# Patient Record
Sex: Female | Born: 1975 | Race: White | Hispanic: No | Marital: Married | Smoking: Current every day smoker
Health system: Southern US, Community
[De-identification: ages and names within clinical notes are randomized; demographics above are authoritative.]

## PROBLEM LIST (undated history)

## (undated) DIAGNOSIS — G5601 Carpal tunnel syndrome, right upper limb: Secondary | ICD-10-CM

## (undated) DIAGNOSIS — D6861 Antiphospholipid syndrome: Secondary | ICD-10-CM

## (undated) DIAGNOSIS — M1990 Unspecified osteoarthritis, unspecified site: Secondary | ICD-10-CM

## (undated) DIAGNOSIS — F419 Anxiety disorder, unspecified: Secondary | ICD-10-CM

## (undated) DIAGNOSIS — F32A Depression, unspecified: Secondary | ICD-10-CM

## (undated) DIAGNOSIS — D689 Coagulation defect, unspecified: Secondary | ICD-10-CM

## (undated) DIAGNOSIS — T7840XA Allergy, unspecified, initial encounter: Secondary | ICD-10-CM

## (undated) DIAGNOSIS — F329 Major depressive disorder, single episode, unspecified: Secondary | ICD-10-CM

## (undated) HISTORY — DX: Coagulation defect, unspecified: D68.9

## (undated) HISTORY — DX: Depression, unspecified: F32.A

## (undated) HISTORY — DX: Unspecified osteoarthritis, unspecified site: M19.90

## (undated) HISTORY — PX: DILATION AND CURETTAGE OF UTERUS: SHX78

## (undated) HISTORY — DX: Anxiety disorder, unspecified: F41.9

## (undated) HISTORY — DX: Allergy, unspecified, initial encounter: T78.40XA

## (undated) HISTORY — DX: Major depressive disorder, single episode, unspecified: F32.9

## (undated) HISTORY — DX: Antiphospholipid syndrome: D68.61

## (undated) HISTORY — PX: EYE SURGERY: SHX253

---

## 2004-10-10 ENCOUNTER — Emergency Department (HOSPITAL_COMMUNITY): Admission: EM | Admit: 2004-10-10 | Discharge: 2004-10-10 | Payer: Self-pay | Admitting: Emergency Medicine

## 2006-05-07 ENCOUNTER — Encounter: Payer: Self-pay | Admitting: Maternal & Fetal Medicine

## 2006-05-09 ENCOUNTER — Ambulatory Visit: Payer: Self-pay

## 2006-05-14 ENCOUNTER — Encounter: Payer: Self-pay | Admitting: Maternal & Fetal Medicine

## 2006-05-21 ENCOUNTER — Encounter: Payer: Self-pay | Admitting: Maternal & Fetal Medicine

## 2006-06-04 ENCOUNTER — Encounter: Payer: Self-pay | Admitting: Maternal & Fetal Medicine

## 2006-07-24 ENCOUNTER — Emergency Department (HOSPITAL_COMMUNITY): Admission: EM | Admit: 2006-07-24 | Discharge: 2006-07-25 | Payer: Self-pay | Admitting: Emergency Medicine

## 2006-09-01 ENCOUNTER — Ambulatory Visit (HOSPITAL_COMMUNITY): Admission: EM | Admit: 2006-09-01 | Discharge: 2006-09-01 | Payer: Self-pay | Admitting: Emergency Medicine

## 2006-09-24 ENCOUNTER — Observation Stay (HOSPITAL_COMMUNITY): Admission: RE | Admit: 2006-09-24 | Discharge: 2006-09-24 | Payer: Self-pay | Admitting: Obstetrics and Gynecology

## 2006-10-22 ENCOUNTER — Ambulatory Visit (HOSPITAL_COMMUNITY): Admission: RE | Admit: 2006-10-22 | Discharge: 2006-10-22 | Payer: Self-pay | Admitting: Obstetrics and Gynecology

## 2006-11-23 ENCOUNTER — Ambulatory Visit (HOSPITAL_COMMUNITY): Admission: AD | Admit: 2006-11-23 | Discharge: 2006-11-23 | Payer: Self-pay | Admitting: Obstetrics and Gynecology

## 2006-11-27 ENCOUNTER — Ambulatory Visit (HOSPITAL_COMMUNITY): Admission: AD | Admit: 2006-11-27 | Discharge: 2006-11-27 | Payer: Self-pay | Admitting: Obstetrics and Gynecology

## 2006-11-30 ENCOUNTER — Ambulatory Visit (HOSPITAL_COMMUNITY): Admission: AD | Admit: 2006-11-30 | Discharge: 2006-11-30 | Payer: Self-pay | Admitting: Obstetrics and Gynecology

## 2006-12-03 ENCOUNTER — Ambulatory Visit (HOSPITAL_COMMUNITY): Admission: AD | Admit: 2006-12-03 | Discharge: 2006-12-03 | Payer: Self-pay | Admitting: Obstetrics and Gynecology

## 2006-12-06 ENCOUNTER — Ambulatory Visit (HOSPITAL_COMMUNITY): Admission: AD | Admit: 2006-12-06 | Discharge: 2006-12-06 | Payer: Self-pay | Admitting: Obstetrics and Gynecology

## 2006-12-10 ENCOUNTER — Ambulatory Visit (HOSPITAL_COMMUNITY): Admission: AD | Admit: 2006-12-10 | Discharge: 2006-12-10 | Payer: Self-pay | Admitting: Obstetrics and Gynecology

## 2006-12-13 ENCOUNTER — Ambulatory Visit (HOSPITAL_COMMUNITY): Admission: AD | Admit: 2006-12-13 | Discharge: 2006-12-13 | Payer: Self-pay | Admitting: Obstetrics and Gynecology

## 2006-12-17 ENCOUNTER — Ambulatory Visit (HOSPITAL_COMMUNITY): Admission: AD | Admit: 2006-12-17 | Discharge: 2006-12-17 | Payer: Self-pay | Admitting: Obstetrics and Gynecology

## 2006-12-24 ENCOUNTER — Ambulatory Visit (HOSPITAL_COMMUNITY): Admission: RE | Admit: 2006-12-24 | Discharge: 2006-12-24 | Payer: Self-pay | Admitting: Obstetrics & Gynecology

## 2006-12-25 ENCOUNTER — Inpatient Hospital Stay (HOSPITAL_COMMUNITY): Admission: AD | Admit: 2006-12-25 | Discharge: 2006-12-26 | Payer: Self-pay | Admitting: Obstetrics and Gynecology

## 2007-01-13 ENCOUNTER — Inpatient Hospital Stay (HOSPITAL_COMMUNITY): Admission: AD | Admit: 2007-01-13 | Discharge: 2007-01-15 | Payer: Self-pay | Admitting: Obstetrics & Gynecology

## 2007-01-13 ENCOUNTER — Ambulatory Visit: Payer: Self-pay | Admitting: Obstetrics and Gynecology

## 2007-01-13 ENCOUNTER — Inpatient Hospital Stay (HOSPITAL_COMMUNITY): Admission: AD | Admit: 2007-01-13 | Discharge: 2007-01-13 | Payer: Self-pay | Admitting: Obstetrics and Gynecology

## 2007-11-05 ENCOUNTER — Other Ambulatory Visit: Payer: Self-pay

## 2007-11-05 ENCOUNTER — Emergency Department: Payer: Self-pay | Admitting: Emergency Medicine

## 2009-06-26 HISTORY — PX: ABDOMINAL HYSTERECTOMY: SHX81

## 2009-06-26 HISTORY — PX: TOTAL ABDOMINAL HYSTERECTOMY: SHX209

## 2009-06-26 HISTORY — PX: PARTIAL HYSTERECTOMY: SHX80

## 2010-11-08 ENCOUNTER — Ambulatory Visit: Payer: Self-pay | Admitting: General Practice

## 2010-11-08 NOTE — H&P (Signed)
Diana Jensen, Diana Jensen                 ACCOUNT NO.:  [id number]   MEDICAL RECORD NO.:  [id number]          PATIENT TYPE:  INP   LOCATION:  NA                            [facility]:  [facility]   PHYSICIAN:  Tilda Burrow, M.D. DATE OF BIRTH:  16-Sep-1975   DATE OF ADMISSION:  DATE OF DISCHARGE:                              HISTORY & PHYSICAL   ADMITTING DIAGNOSES:  1. Pregnancy, 39 weeks' gestation.  2. Prior cesarean section, not for trial of labor.  3. Antiphospholipid syndrome.  4. Scheduled for admission.   Date of admission January 03, 2007.   This 35 year old female, gravida 5, para 1-0-3-1.  Antiphospholipid  syndrome.  History of positive lupus anticoagulant.   HISTORY OF PRESENT ILLNESS:  This 35 year old female, gravida 5, para 1-  0-3-1 is admitted for repeat cesarean section.  Pregnancy has been  notable for initial prenatal care through [facility] in [place]  with consultation to hematology at [facility], Dr. Lady Deutscher.  She was then followed through the pregnancy with a straight-forward  course  with antiphospholipid syndrome managed with subcu Lovenox 40 mg  daily until the patient was eight weeks' gestation, 40 mg subcu twice  daily until 36 weeks, at which time efforts were made to convert her to  subcu heparin 10,000 units subcu daily, but this ran into an obstacle  due to lack of access to concentrated, unfractionated heparin.  She has  therefore been kept on subcutaneous Lovenox 40 mg twice daily until  shortly before the surgery.  She will be discontinued the day before  surgery.  Surgery is scheduled for July 10 at 9 a.m.   Pregnant course has been straight-forward.  She has had ultrasound on  December 24, 2006, for estimated fetal weight of the baby.  She has a  history of large-for-gestational-age 3 pound 8 ounce infant delivered at  term in 2002.  She does not desire permanent sterilization.   PAST MEDICAL HISTORY:  Negative other than antiphospholipid  syndrome and  apparent identification in the past of lupus anticoagulant, tested  positive in February 1998.  This test was performed after 14 weeks'  gestation spontaneous abortion of triplets.  Plans are for her to resume  subcutaneous Lovenox and enoxaparin 24 hours after the cesarean section.  She will continue this for six weeks postpartum.  Past medical history  otherwise negative.   PAST SURGICAL HISTORY:  Broken right ankle surgery in 2006.   ALLERGIES:  None known.   MEDICATIONS:  1. Lovenox 40 mg subcu b.i.d.  2. Aspirin 81 mg p.o. daily.   SOCIAL HISTORY:  Married.  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   PRENATAL LABS:  Include blood type O positive.  Urine drug screen  negative.  Rubella immune.  Present hemoglobin 12, hematocrit 41.  Hepatitis, HIV, RPR, GC and Chlamydia all negative.  One-hour glucose  tolerance test 135 mg%.   The patient has been seen in labor and delivery for false labor x1, on  July 1, and is scheduled for cesarean section.   PHYSICAL EXAMINATION:  VITAL SIGNS:  Height 5  feet 7 inches, weight 274,  which is a 10-pound weight gain this pregnancy.  GENERAL:  Patient is alert, oriented x3.  HEENT:  Pupils equal, round, reactive.  NECK:  Supple.  Trachea midline.  ABDOMEN:  A 44-cm fundal height.  Estimated fetal weight 9 pounds plus.  Cervix long, closed and high at last check.   PLAN:  1. Repeat cesarean section on 03 January 2007 at 9 a.m.  2. Resume subcutaneous Lovenox 40 mg subcu daily x6 weeks postpartum.      Tilda Burrow, M.D.  Electronically Signed     JVF/MEDQ  D:  12/26/2006  T:  12/27/2006  Job:  [id number]   cc:   [facility]

## 2010-11-08 NOTE — Consult Note (Signed)
[name], Diana Jensen                 ACCOUNT NO.:  [id number]   MEDICAL RECORD NO.:  [id number]          PATIENT TYPE:  MAT   LOCATION:  MATC                          [facility]:  [facility]   PHYSICIAN:  Tilda Burrow, M.D. DATE OF BIRTH:  1975-07-05   DATE OF CONSULTATION:  12/26/2006  DATE OF DISCHARGE:  12/26/2006                                 CONSULTATION   OBSERVATION TIME:  2 hours.   This 35 year old female, now at [redacted] weeks gestation due 18 July with  known breech presentation is seen to rule out labor.  She is having  bilateral inguinal discomfort and originally was not sure if it was  fetal movement or contractions.  After assessing, she has decided it is  mainly fetal movement associated with the discomfort.  She is having no  active contractions.  Cervix checked by a nurse was reported as closed.  She is felt to be persistent in the breech presentation.  She had an  ultrasound that [facility] yesterday, report results at [facility].   IMPRESSION:  1. Pregnancy at 38+ weeks.  2. Lightening discomfort due to - lower uterine segment changes.  3. No evidence of labor.  4. Persistent breech presentation.   PLAN:  Follow up within a week for a doctor here to schedule a section  as previously planned on the 10th.  The patient reminded of how to  contact our office for advancing service and office hours, etc.   SPECIAL NOTE:  The patient has a 1 hour travel time from her [place] home to [facility].      Tilda Burrow, M.D.  Electronically Signed     JVF/MEDQ  D:  12/26/2006  T:  12/26/2006  Job:  [id number]

## 2010-11-11 NOTE — H&P (Signed)
Diana Jensen, Diana Jensen                 ACCOUNT NO.:  [id number]   MEDICAL RECORD NO.:  [id number]          PATIENT TYPE:  OIB   LOCATION:  LDR3                          FACILITY:  [facility]   PHYSICIAN:  Tilda Burrow, M.D. DATE OF BIRTH:  [birth year]   DATE OF ADMISSION:  09/24/2006  DATE OF DISCHARGE:  LH                              HISTORY & PHYSICAL   REASON FOR ADMISSION:  Pregnancy at 24 weeks with severe stuffy nose,  nasal drainage and vomiting due to copious sinus drainage.   MEDICAL HISTORY:  Positive for an antiphospholipid abnormality, positive  for lupus, anticoagulant.   MEDICATIONS:  Lovenox and aspirin and a prenatal vitamin.   Blood type is O positive.  RPR is nonreactive.  She is positive for  Rubella, negative for hepatitis, negative for gonorrhea and chlamydia.   Her obstetrical history has been complicated by miscarriages due to the  antiphospholipid disorder, but she is now 24 weeks and presents with an  upper respiratory tract infection.   PLAN:  We are going to get a CBC, a CMP, assess a urine.  Place her on  Zyrtec D b.i.d., Afrin at bedside, a Z-Pak and if she is retaining  fluids and feeling better by 12, we will discharge her at home.      [name], [name]      Tilda Burrow, M.D.  Electronically Signed    DL/MEDQ  D:  04/54/0981  T:  09/24/2006  Job:  [id number]   cc:   [facility]

## 2010-11-11 NOTE — Discharge Summary (Signed)
NAMEJANIA, Diana Jensen                 ACCOUNT NO.:  [id number]   MEDICAL RECORD NO.:  [id number]          PATIENT TYPE:  INP   LOCATION:                                [facility]   PHYSICIAN:  Tilda Burrow, M.D. DATE OF BIRTH:  1976-02-20   DATE OF ADMISSION:  01/13/2007  DATE OF DISCHARGE:  01/15/2007                               DISCHARGE SUMMARY   ADMISSION DIAGNOSIS:  Pregnancy [redacted] weeks gestation Antiphospholipid  Syndrome, status post anticoagualant therapy throughout pregnancy.   HISTORY OF PRESENT ILLNESS:  This 35 year old female gravida 5, para 1-0-  3-1 with history of positive Lupus Anticoagulants and antiphospholipid  syndrome test after a spontaneous abortion, diagnosed after loss of  triplets in 1998 at [facility] is admitted for repeat cesarean  section on 01/13/07.  After pregnancy followed through [facility] with the patient consultant with hematology, Dr. Hughes Better  who  recommended subcutaneous  Lovenox 40 mg subcutaneously once daily until  [redacted] weeks gestation, twice daily until [redacted] weeks gestation and then  continued until delivery.  She had normal__  weight gain fetal  monitoring tests and fetal activity.   HOSPITAL COURSE:  The patient was admitted, and proceeded to vaginal  Delivery by Dr Marice Potter on 7/20 2008, was resumed on subcutaneous Lovenox  (enoxaparin DML ) to be continued in 40 mg subcutaneously daily for 6  weeks postpartum.  Her hospital course was uneventful with the patient  having a straightforward uncomplicated surgical recovery.  She will be  followed up in 1 week and then 4 weeks [facility].  Routine  postoperative and surgical instructions were reviewed with the patient.   Note prior incorrect dictation corrected as above.      Tilda Burrow, M.D.  Electronically Signed     JVF/MEDQ  D:  02/19/2007  T:  02/20/2007  Job:  [id number]

## 2011-04-10 LAB — CBC
HCT: 36.3
MCHC: 33.5
MCHC: 34
MCV: 84.3
MCV: 84.9
RBC: 4.77
RDW: 15.3 — ABNORMAL HIGH

## 2011-05-29 ENCOUNTER — Encounter: Payer: Self-pay | Admitting: Physician Assistant

## 2011-06-27 ENCOUNTER — Encounter: Payer: Self-pay | Admitting: Physician Assistant

## 2011-09-28 ENCOUNTER — Ambulatory Visit: Payer: Self-pay

## 2011-09-28 LAB — HEMATOCRIT: HCT: 40.9 % (ref 35.0–47.0)

## 2011-10-05 ENCOUNTER — Inpatient Hospital Stay: Payer: Self-pay

## 2011-10-05 LAB — PREGNANCY, URINE: Pregnancy Test, Urine: NEGATIVE m[IU]/mL

## 2013-03-31 ENCOUNTER — Emergency Department: Payer: Self-pay | Admitting: Emergency Medicine

## 2013-03-31 LAB — CBC
HCT: 43.6 %
HGB: 15 g/dL
MCH: 28.4 pg
MCHC: 34.3 g/dL
MCV: 83 fL
Platelet: 278 x10 3/mm 3
RBC: 5.27 X10 6/mm 3 — ABNORMAL HIGH
RDW: 13.6 %
WBC: 11.3 x10 3/mm 3 — ABNORMAL HIGH

## 2013-03-31 LAB — BASIC METABOLIC PANEL
Anion Gap: 9 (ref 7–16)
BUN: 11 mg/dL (ref 7–18)
Calcium, Total: 8.8 mg/dL (ref 8.5–10.1)
Chloride: 107 mmol/L (ref 98–107)
EGFR (African American): >60
EGFR (Non-African Amer.): >60
Glucose: 137 mg/dL — ABNORMAL HIGH (ref 65–99)

## 2013-03-31 LAB — URINALYSIS, COMPLETE
Bacteria: NONE SEEN
Bilirubin,UR: NEGATIVE
Glucose,UR: NEGATIVE mg/dL (ref 0–75)
Leukocyte Esterase: NEGATIVE
Nitrite: NEGATIVE
RBC,UR: 1 /HPF (ref 0–5)
WBC UR: 3 /HPF (ref 0–5)

## 2013-03-31 LAB — PREGNANCY, URINE: Pregnancy Test, Urine: NEGATIVE m[IU]/mL

## 2014-10-18 NOTE — Op Note (Signed)
PATIENT NAME:  Diana Jensen, Diana Jensen MR#:  [id number] DATE OF BIRTH:  Sep 04, 1975  DATE OF PROCEDURE:  10/05/2011  PREOPERATIVE DIAGNOSIS: Menorrhagia.   POSTOPERATIVE DIAGNOSIS: Menorrhagia.   OPERATION PERFORMED:  1. Attempted laparoscopic hysterectomy.  2. Failed exploratory laparotomy.  3. Supracervical hysterectomy.   SURGEON: Wonda Cheng. Laurey Morale, MD   FIRST ASSISTANT: Barnett Applebaum, MD   OPERATIVE FINDINGS: Large multiparous size uterus. Tubes and ovaries looked normal, therefore, left in-situ per patient's request.   OPERATION: After adequate general anesthesia, the patient was prepped and draped in routine fashion. An infraumbilical incision was made through the skin and approximately 3 liters of carbon dioxide were insufflated without incident. During insufflation the bladder was drained and the uterine manipulator was placed. With some difficulty using the long laparoscopic trocar, laparoscope was inserted, accessory ports were placed in the right and left lower quadrants. Unfortunately, visualization was much less than ideal and although we could visualize the top of the uterus there was no way we could be able to visualize and safely perform the hysterectomy, therefore, instruments were removed. The patient was repositioned, reprepped. Exploratory laparotomy was then performed in a Pfannenstiel fashion, carried down the various layers and the peritoneal cavity was entered. The bowels were packed back. Balfour was inserted. The uterus was grabbed with a double-tooth tenaculum. The suspensory ligament of the ovary, tube, and broad ligament were doubly clamped and doubly suture ligated bilaterally. Cardinal ligaments were serially clamped, doubly suture ligated. Uterine vessels were doubly clamped, doubly suture ligated. The specimen was amputated supracervically. The endocervix was cauterized with cautery. The stump of the cervix was then closed with interrupted sutures. All areas of surgery were  inspected and found to be hemostatic. Rectus muscles were reapproximated in the midline. Fascia was reapproximated with two continuous sutures of Maxon. Multiple sutures of 2-0 plain were used to close the fat and the skin was closed with skin staples. The remainder of the laparoscopic incisions were closed in routine fashion. The patient tolerated the procedure well and left the operating room in good condition. Sponge and needle counts were said to be correct at the end of the procedure. Estimated loss was 50 mL. The patient tolerated the procedure well.    ____________________________ Wonda Cheng. Laurey Morale, MD pjr:drc D: 10/05/2011 11:57:00 ET T: 10/05/2011 12:42:15 ET JOB#: [id number]  cc: Wonda Cheng. Laurey Morale, MD, <Dictator> Rosina Lowenstein MD ELECTRONICALLY SIGNED 10/17/2011 18:54

## 2016-02-22 ENCOUNTER — Encounter: Payer: Self-pay | Admitting: Family Medicine

## 2016-02-22 ENCOUNTER — Ambulatory Visit (INDEPENDENT_AMBULATORY_CARE_PROVIDER_SITE_OTHER): Payer: BC Managed Care – PPO | Admitting: Family Medicine

## 2016-02-22 VITALS — BP 106/78 | HR 68 | Resp 18 | Ht 66.5 in | Wt 225.0 lb

## 2016-02-22 DIAGNOSIS — Z23 Encounter for immunization: Secondary | ICD-10-CM

## 2016-02-22 DIAGNOSIS — D6861 Antiphospholipid syndrome: Secondary | ICD-10-CM | POA: Insufficient documentation

## 2016-02-22 DIAGNOSIS — G479 Sleep disorder, unspecified: Secondary | ICD-10-CM | POA: Insufficient documentation

## 2016-02-22 DIAGNOSIS — Z720 Tobacco use: Secondary | ICD-10-CM | POA: Insufficient documentation

## 2016-02-22 DIAGNOSIS — F3341 Major depressive disorder, recurrent, in partial remission: Secondary | ICD-10-CM | POA: Insufficient documentation

## 2016-02-22 DIAGNOSIS — Z803 Family history of malignant neoplasm of breast: Secondary | ICD-10-CM | POA: Insufficient documentation

## 2016-02-22 DIAGNOSIS — Z7689 Persons encountering health services in other specified circumstances: Secondary | ICD-10-CM

## 2016-02-22 DIAGNOSIS — M545 Low back pain, unspecified: Secondary | ICD-10-CM

## 2016-02-22 DIAGNOSIS — G8929 Other chronic pain: Secondary | ICD-10-CM

## 2016-02-22 DIAGNOSIS — Z7189 Other specified counseling: Secondary | ICD-10-CM | POA: Diagnosis not present

## 2016-02-22 HISTORY — DX: Sleep disorder, unspecified: G47.9

## 2016-02-22 MED ORDER — MELOXICAM 15 MG PO TABS: 15.0000 mg | ORAL_TABLET | Freq: Every day | ORAL | 1 refills | Status: DC

## 2016-02-22 MED ORDER — CITALOPRAM HYDROBROMIDE 20 MG PO TABS: 20.0000 mg | ORAL_TABLET | Freq: Every day | ORAL | 1 refills | Status: DC

## 2016-02-22 NOTE — Patient Instructions (Signed)
Continue citalopram daily I have referred to psychology  Congratulations on your excellent weight loss  I have started Mobic once a day for back and arthritis pain Take once a day with food Also I referred to physical therapy  I have referred for a sleep study  See me in 3 months  Call sooner for problems

## 2016-02-22 NOTE — Progress Notes (Signed)
Chief Complaint  Patient presents with  . [facility] in [place] was last PCP    Kamaree is a 40 year old married woman who is here today to establish. Accompanied by her husband. No old records available for review.  Medical history is reviewed. Patient states she has antiphospholipid syndrome. She is not on any medication for this. She states that was identified during her pregnancy. She was not able to take birth control pills. She had irregular bleeding and she states this is the reason for her hysterectomy.  She is a smoker for 20 years. The importance of quitting smoking is reviewed. Patient is not able to quit smoking at this time because of active depression symptoms. Patient has been depressed off and on for many years. She takes citalopram 20 mg a day. She requests referral for counseling. No thoughts of harming self or others. She is able to function as a wife, mother, and full duty employment.  Her mother has breast cancer. Jimin has never had a mammogram. This is recommended for her.  Her shots are not up-to-date. Daily As recommended but declined. She does agree to a flu shot today.  She has chronic low back pain. She states she had x-rays that showed an abnormal curvature (describes lordosis) and "bone spurs". 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 I recommend anti-inflammatory medications and referral to physical therapy.  Deondrea's is quite overweight. She weighs 220 pounds. She states that she has recently lost 40 pounds through walking. She is congratulated on this effort.  She tells me she does not feel like she sleeps well. She has difficulty falling asleep. She wakes up every morning with a headache. During the day even at work she feels like she could fall asleep at any time. I feel like she may have sleep-disordered breathing and have ordered a polysomnogram for this reason.  Patient Active Problem List   Diagnosis Date Noted  . Antiphospholipid syndrome ([place])  02/22/2016  . Tobacco abuse 02/22/2016  . Family history of breast cancer 02/22/2016  . Lumbago 02/22/2016  . Sleep disorder 02/22/2016  . Depression, major, recurrent, in partial remission ([place]) 02/22/2016   Outpatient Encounter Prescriptions as of 02/22/2016  Medication Sig  . citalopram (CELEXA) 20 MG tablet Take 1 tablet (20 mg total) by mouth daily.  . meloxicam (MOBIC) 15 MG tablet Take 1 tablet (15 mg total) by mouth daily. TAKE WITH FOOD   No facility-administered encounter medications on file as of 02/22/2016.    Past Surgical History:  Procedure Laterality Date  . ABDOMINAL HYSTERECTOMY  2011   partial - bleeding  . EYE SURGERY     lazy eye   Family History  Problem Relation Age of Onset  . Cancer Mother     breast   . Arthritis Mother   . Hypertension Mother   . Thyroid disease Father   . Mental illness Sister     depression ? bipolar  . Dementia Maternal Grandmother   . Cancer Maternal Grandfather   . Heart disease Paternal Grandmother   . Cancer Paternal Grandfather    Social History   Social History  . Marital status: Married    Spouse name: N/A  . Number of children: N/A  . Years of education: N/A   Occupational History  . Not on file.   Social History Main Topics  . Smoking status: Current Every Day Smoker    Packs/day: 1.00    Years: 20.00  . Smokeless tobacco: Never  Used  . Alcohol use No  . Drug use: No  . Sexual activity: Yes    Birth control/ protection: Surgical   BP 106/78   Pulse 68   Resp 18   Ht 5' 6.5" (1.689 m)   Wt 225 lb (102.1 kg)   SpO2 98%   BMI 35.77 kg/m   Review of Systems  Constitutional: Negative for chills, fever and weight loss.  HENT: Negative for congestion and hearing loss.   Eyes: Negative for blurred vision and pain.  Respiratory: Negative for cough and shortness of breath.   Cardiovascular: Negative for chest pain and leg swelling.  Gastrointestinal: Negative for abdominal pain, constipation, diarrhea  and heartburn.  Genitourinary: Negative for dysuria and frequency.  Musculoskeletal: Positive for back pain. Negative for falls, joint pain and myalgias.  Neurological: Negative for dizziness, seizures and headaches.  Endo/Heme/Allergies: Positive for environmental allergies. Does not bruise/bleed easily.  Psychiatric/Behavioral: Positive for depression. The patient is not nervous/anxious and does not have insomnia.    Physical Exam  Constitutional: She is oriented to person, place, and time. She appears well-developed and well-nourished. No distress.  obese  HENT:  Head: Normocephalic and atraumatic.  Right Ear: External ear normal.  Left Ear: External ear normal.  Mouth/Throat: Oropharynx is clear and moist.  Eyes: Conjunctivae are normal. Pupils are equal, round, and reactive to light.  strabismus  Neck: Normal range of motion. Neck supple. No thyromegaly present.  Cardiovascular: Normal rate, regular rhythm and normal heart sounds.   Pulmonary/Chest: Effort normal and breath sounds normal. No respiratory distress.  Abdominal: Soft. Bowel sounds are normal. She exhibits no distension. There is no tenderness.  Musculoskeletal: Normal range of motion.  Lumbar spine is straight and symmetric. Full range of motion. No tenderness or muscle spasm. Strength, sensation, range of motion, and reflexes are normal in both lower extremities. Straight leg raise is negative bilateral.   Lymphadenopathy:    She has no cervical adenopathy.  Neurological: She is alert and oriented to person, place, and time.  Skin: Skin is warm and dry.  Psychiatric: She has a normal mood and affect. Her behavior is normal. Judgment and thought content normal.    Raelene was seen today for establish care.  Diagnoses and all orders for this visit:  Encounter to establish care with new doctor  Antiphospholipid syndrome ([place])  Tobacco abuse  Family history of breast cancer  Chronic midline low back pain  without sciatica -     Ambulatory referral to Physical Therapy  Sleep disorder -     Nocturnal polysomnography (NPSG); Future  Depression, major, recurrent, in partial remission ([place]) -     Ambulatory referral to Psychology  Encounter for immunization -     Flu Vaccine QUAD 36+ mos IM  Other orders -     meloxicam (MOBIC) 15 MG tablet; Take 1 tablet (15 mg total) by mouth daily. TAKE WITH FOOD -     citalopram (CELEXA) 20 MG tablet; Take 1 tablet (20 mg total) by mouth daily.   Patient Instructions  Continue citalopram daily I have referred to psychology  Congratulations on your excellent weight loss  I have started Mobic once a day for back and arthritis pain Take once a day with food Also I referred to physical therapy  I have referred for a sleep study  See me in 3 months  Call sooner for problems

## 2016-02-23 ENCOUNTER — Other Ambulatory Visit: Payer: Self-pay | Admitting: Family Medicine

## 2016-02-23 DIAGNOSIS — Z1231 Encounter for screening mammogram for malignant neoplasm of breast: Secondary | ICD-10-CM

## 2016-03-06 ENCOUNTER — Telehealth (HOSPITAL_COMMUNITY): Payer: Self-pay | Admitting: *Deleted

## 2016-03-06 NOTE — Telephone Encounter (Signed)
phone call regarding an appointment.  no voice mail set up yet.

## 2016-03-07 ENCOUNTER — Telehealth (HOSPITAL_COMMUNITY): Payer: Self-pay | Admitting: *Deleted

## 2016-03-07 ENCOUNTER — Ambulatory Visit (HOSPITAL_COMMUNITY): Payer: BC Managed Care – PPO | Admitting: Physical Therapy

## 2016-03-13 ENCOUNTER — Ambulatory Visit (HOSPITAL_COMMUNITY): Payer: BC Managed Care – PPO

## 2016-03-23 ENCOUNTER — Ambulatory Visit (HOSPITAL_COMMUNITY): Payer: Self-pay | Admitting: Psychiatry

## 2016-04-05 ENCOUNTER — Ambulatory Visit (HOSPITAL_COMMUNITY): Payer: BC Managed Care – PPO | Admitting: Physical Therapy

## 2016-04-05 ENCOUNTER — Telehealth (HOSPITAL_COMMUNITY): Payer: Self-pay | Admitting: Physical Therapy

## 2016-04-05 NOTE — Telephone Encounter (Signed)
Pt called and cancelled initial evaluation appointment.  No reason specified.  Teena Irani, PTA/CLT [phone]

## 2016-05-17 ENCOUNTER — Telehealth: Payer: Self-pay | Admitting: Family Medicine

## 2016-05-17 NOTE — Telephone Encounter (Signed)
Couldn't leave message about cancelling apt next week. Dr. Meda Coffee will be out of town.

## 2016-05-22 ENCOUNTER — Telehealth (HOSPITAL_COMMUNITY): Payer: Self-pay | Admitting: *Deleted

## 2016-05-22 NOTE — Telephone Encounter (Signed)
Phone call regarding an appointment.  mailbox is full 

## 2016-05-24 ENCOUNTER — Ambulatory Visit: Payer: BC Managed Care – PPO | Admitting: Family Medicine

## 2016-06-01 ENCOUNTER — Ambulatory Visit: Payer: BC Managed Care – PPO | Admitting: Family Medicine

## 2016-06-02 ENCOUNTER — Encounter: Payer: Self-pay | Admitting: Family Medicine

## 2016-06-22 ENCOUNTER — Ambulatory Visit: Payer: BC Managed Care – PPO | Admitting: Family Medicine

## 2016-08-21 ENCOUNTER — Ambulatory Visit (INDEPENDENT_AMBULATORY_CARE_PROVIDER_SITE_OTHER): Payer: BC Managed Care – PPO | Admitting: Family Medicine

## 2016-08-21 ENCOUNTER — Encounter: Payer: Self-pay | Admitting: Family Medicine

## 2016-08-21 VITALS — BP 118/80 | HR 84 | Temp 96.1°F | Resp 18 | Ht 64.0 in | Wt 234.1 lb

## 2016-08-21 DIAGNOSIS — F4001 Agoraphobia with panic disorder: Secondary | ICD-10-CM | POA: Insufficient documentation

## 2016-08-21 DIAGNOSIS — Z720 Tobacco use: Secondary | ICD-10-CM

## 2016-08-21 DIAGNOSIS — J069 Acute upper respiratory infection, unspecified: Secondary | ICD-10-CM | POA: Diagnosis not present

## 2016-08-21 MED ORDER — ALPRAZOLAM 0.25 MG PO TABS: 0.2500 mg | ORAL_TABLET | Freq: Two times a day (BID) | ORAL | 0 refills | Status: DC | PRN

## 2016-08-21 NOTE — Patient Instructions (Signed)
For the respiratory infection continue OTC medicine and push fluids  Take the xanax as needed for panic  See me on regular schedule

## 2016-08-21 NOTE — Progress Notes (Signed)
Chief Complaint  Patient presents with  . URI    x 1 week  Patient has had sore throat, runny nose, postnasal drip, ear pressure and pain, and cough for approximately 1 week. She is using over-the-counter cough medications. No fever or chills. No purulent sputum. No malaise or body aches. Patient has depression. Her depression appears stable but she is having trouble with anxiety. She has become agoraphobic. She has trouble leaving the house. I noticed today that she is quite anxious, regarding her hands, diaphoretic and clammy skin. She is compliant with her citalopram. She did not go for counseling, does not feel able. She also was unable to keep her appointment for her mammogram because of anxiety. I have discussed the multiple health risks associated with cigarette smoking including, but not limited to, cardiovascular disease, lung disease and cancer.  I have strongly recommended that smoking be stopped.  I have reviewed the various methods of quitting including cold [place], classes, nicotine replacements and prescription medications.  I have offered assistance in this difficult process.  The patient is not interested in assistance at this time. Feels unable to quit because of her anxiety   Patient Active Problem List   Diagnosis Date Noted  . 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 Antiphospholipid syndrome ([place]) 02/22/2016  . Tobacco abuse 02/22/2016  . Family history of breast cancer 02/22/2016  . Lumbago 02/22/2016  . Sleep disorder 02/22/2016  . Depression, major, recurrent, in partial remission ([place]) 02/22/2016    Outpatient Encounter Prescriptions as of 08/21/2016  Medication Sig  . citalopram (CELEXA) 20 MG tablet Take 1 tablet (20 mg total) by mouth daily.  . Melatonin 3 MG TABS Take by mouth.  . meloxicam (MOBIC) 15 MG tablet Take 1 tablet (15 mg total) by mouth daily. TAKE WITH FOOD  . ALPRAZolam (XANAX) 0.25 MG tablet Take 1 tablet (0.25 mg total) by mouth 2 (two)  times daily as needed for anxiety.   No facility-administered encounter medications on file as of 08/21/2016.     No Known Allergies  Review of Systems  Constitutional: Positive for activity change and unexpected weight change. Negative for chills and fatigue.       Has gained weight, less active  HENT: Positive for congestion, ear pain, postnasal drip, rhinorrhea, sinus pain, sinus pressure, sore throat and voice change. Negative for nosebleeds.   Eyes: Negative for redness and visual disturbance.  Respiratory: Positive for cough. Negative for shortness of breath and wheezing.   Cardiovascular: Negative.  Negative for chest pain.  Gastrointestinal: Negative.  Negative for constipation and diarrhea.  Genitourinary: Negative for difficulty urinating and frequency.  Musculoskeletal: Positive for back pain. Negative for arthralgias and myalgias.       Chronic intermittent back pain  Neurological: Negative for dizziness and headaches.  Psychiatric/Behavioral: Positive for behavioral problems and sleep disturbance. The patient is nervous/anxious.        Agoraphobia    BP 118/80 (BP Location: Right Arm, Patient Position: Sitting, Cuff Size: Normal)   Pulse 84   Temp (!) 96.1 F (35.6 C) (Temporal)   Resp 18   Ht 5\' 4"  (1.626 m)   Wt 234 lb 1.9 oz (106.2 kg)   SpO2 96%   BMI 40.19 kg/m   Physical Exam  Constitutional: She is oriented to person, place, and time. She appears well-developed and well-nourished.  HENT:  Head: Normocephalic and atraumatic.  Right Ear: External ear normal.  Left Ear: External ear normal.  Nose:  Nose normal.  Mouth/Throat: Oropharynx is clear and moist.  Mild injection posterior pharynx. No sinus tenderness.  Eyes: Conjunctivae are normal. Pupils are equal, round, and reactive to light.  Neck: Normal range of motion.  Cardiovascular: Normal rate, regular rhythm and normal heart sounds.   Pulmonary/Chest: Effort normal and breath sounds normal. She has  no wheezes.  Abdominal: Soft. Bowel sounds are normal. There is no tenderness.  Lymphadenopathy:    She has no cervical adenopathy.  Neurological: She is alert and oriented to person, place, and time.  Skin: She is diaphoretic.  Psychiatric: Her behavior is normal. Her mood appears anxious. Cognition and memory are normal.  hand wringing    ASSESSMENT/PLAN:  1. Panic disorder with agoraphobia Discussed continuing citalopram. Add Xanax 0.25 15 minutes before leaving the house. Regular exercises recommended. Patient is advised to learn and practice deep breathing relaxation exercises.  2. Upper respiratory infection with cough and congestion This is a virus that will resolved without intervention. Symptomatic care discussed 3. Tobacco abuse  Greater than 50% of this visit was spent in counseling and coordinating care.  Total face to face time:  25 min   Patient Instructions  For the respiratory infection continue OTC medicine and push fluids  Take the xanax as needed for panic  See me on regular schedule   Raylene Everts, MD

## 2016-09-12 ENCOUNTER — Other Ambulatory Visit: Payer: Self-pay

## 2016-09-12 MED ORDER — CITALOPRAM HYDROBROMIDE 20 MG PO TABS: 20.0000 mg | ORAL_TABLET | Freq: Every day | ORAL | 3 refills | Status: DC

## 2016-09-12 MED ORDER — MELOXICAM 15 MG PO TABS: 15.0000 mg | ORAL_TABLET | Freq: Every day | ORAL | 3 refills | Status: DC

## 2016-09-12 NOTE — Telephone Encounter (Signed)
Seen 2 26 18

## 2016-10-19 ENCOUNTER — Telehealth: Payer: Self-pay | Admitting: Family Medicine

## 2016-10-19 NOTE — Telephone Encounter (Signed)
20 pills lasted her 2 months, she may have a refill

## 2016-10-19 NOTE — Telephone Encounter (Signed)
xanex refill request   Pharmacy: [facility] in [place]

## 2016-10-20 MED ORDER — ALPRAZOLAM 0.25 MG PO TABS: 0.2500 mg | ORAL_TABLET | Freq: Two times a day (BID) | ORAL | 0 refills | Status: DC | PRN

## 2017-02-02 ENCOUNTER — Other Ambulatory Visit: Payer: Self-pay

## 2017-02-02 MED ORDER — ALPRAZOLAM 0.25 MG PO TABS: 0.2500 mg | ORAL_TABLET | Freq: Two times a day (BID) | ORAL | 0 refills | Status: DC | PRN

## 2017-02-02 NOTE — Telephone Encounter (Signed)
Seen [phone]   Is this something you want to refill?

## 2017-03-08 ENCOUNTER — Ambulatory Visit (INDEPENDENT_AMBULATORY_CARE_PROVIDER_SITE_OTHER): Payer: BC Managed Care – PPO | Admitting: Family Medicine

## 2017-03-08 ENCOUNTER — Encounter: Payer: Self-pay | Admitting: Family Medicine

## 2017-03-08 VITALS — BP 118/62 | HR 80 | Temp 98.1°F | Resp 16 | Ht 64.0 in | Wt 227.0 lb

## 2017-03-08 DIAGNOSIS — Z23 Encounter for immunization: Secondary | ICD-10-CM | POA: Diagnosis not present

## 2017-03-08 DIAGNOSIS — F3341 Major depressive disorder, recurrent, in partial remission: Secondary | ICD-10-CM | POA: Diagnosis not present

## 2017-03-08 MED ORDER — CITALOPRAM HYDROBROMIDE 20 MG PO TABS: 40.0000 mg | ORAL_TABLET | Freq: Every day | ORAL | 0 refills | Status: DC

## 2017-03-08 NOTE — Patient Instructions (Signed)
Double the celexa See Dr Modesta Messing as scheduled Call for problems

## 2017-03-08 NOTE — Progress Notes (Signed)
    Chief Complaint  Patient presents with  . Depression   Follow up depression celexa not working as well as prior Has taken 3-4 years Denies increased stress Work and family are fine Has an appt with Dr Modesta Messing in 2 weeks Uses xanax infrequent for social anxiety Still smoking Does not feel able to stop Sleep interrupted No vegetative symptoms  Patient Active Problem List   Diagnosis Date Noted  . 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 Antiphospholipid syndrome ([place]) 02/22/2016  . Tobacco abuse 02/22/2016  . Family history of breast cancer 02/22/2016  . Lumbago 02/22/2016  . Sleep disorder 02/22/2016  . Depression, major, recurrent, in partial remission ([place]) 02/22/2016    Outpatient Encounter Prescriptions as of 03/08/2017  Medication Sig  . ALPRAZolam (XANAX) 0.25 MG tablet Take 1 tablet (0.25 mg total) by mouth 2 (two) times daily as needed for anxiety.  . citalopram (CELEXA) 20 MG tablet Take 2 tablets (40 mg total) by mouth daily.  . Melatonin 3 MG TABS Take by mouth.  . meloxicam (MOBIC) 15 MG tablet Take 1 tablet (15 mg total) by mouth daily. TAKE WITH FOOD  . [DISCONTINUED] citalopram (CELEXA) 20 MG tablet Take 1 tablet (20 mg total) by mouth daily.   No facility-administered encounter medications on file as of 03/08/2017.     No Known Allergies  Review of Systems  Constitutional: Negative for activity change, appetite change and unexpected weight change.  HENT: Negative for congestion, dental problem, postnasal drip and rhinorrhea.   Eyes: Negative for redness and visual disturbance.  Respiratory: Negative for cough and shortness of breath.   Cardiovascular: Negative for chest pain, palpitations and leg swelling.  Gastrointestinal: Negative for abdominal pain, constipation and diarrhea.  Genitourinary: Negative for difficulty urinating, frequency and menstrual problem.  Musculoskeletal: Negative for arthralgias and back pain.  Neurological: Negative  for dizziness and headaches.  Psychiatric/Behavioral: Positive for dysphoric mood. Negative for sleep disturbance. The patient is nervous/anxious.     BP 118/62 (BP Location: Right Arm, Patient Position: Sitting, Cuff Size: Normal)   Pulse 80   Temp 98.1 F (36.7 C) (Temporal)   Resp 16   Ht 5\' 4"  (1.626 m)   Wt 227 lb 0.6 oz (103 kg)   SpO2 98%   BMI 38.97 kg/m   Physical Exam  Constitutional: She is oriented to person, place, and time. She appears well-developed and well-nourished. No distress.  HENT:  Head: Normocephalic and atraumatic.  Neurological: She is alert and oriented to person, place, and time.  Skin: Skin is warm and dry.  Psychiatric: Her behavior is normal. Thought content normal.  Mild lability.  Good judgement   Greater than 50% of this visit was spent in counseling and coordinating care.  Total face to face time:  15 min discussing depression medicines. Counseling.  Self care  ASSESSMENT/PLAN:  1. Need for influenza vaccination  - Flu Vaccine QUAD 36+ mos IM  2. Depression, major, recurrent, in partial remission [facility])    Patient Instructions  Double the celexa See Dr Modesta Messing as scheduled Call for problems   Raylene Everts, MD

## 2017-03-20 ENCOUNTER — Ambulatory Visit: Payer: Self-pay | Admitting: Family Medicine

## 2017-03-21 NOTE — Progress Notes (Deleted)
Psychiatric Initial Adult Assessment   Patient Identification: Diana Jensen MRN:  [id number] Date of Evaluation:  03/21/2017 Referral Source: *** Chief Complaint:   Visit Diagnosis: No diagnosis found.  History of Present Illness:   Diana Jensen is a 41 y.o. year old female with a history of depression, antiphospholipid syndrome, who presents for depression.      Associated Signs/Symptoms: Depression Symptoms:  {DEPRESSION SYMPTOMS:20000} (Hypo) Manic Symptoms:  {BHH MANIC SYMPTOMS:22872} Anxiety Symptoms:  {BHH ANXIETY SYMPTOMS:22873} Psychotic Symptoms:  {BHH PSYCHOTIC SYMPTOMS:22874} PTSD Symptoms: {BHH PTSD SYMPTOMS:22875}  Past Psychiatric History:  I have reviewed the patient's psychiatry history in detail and updated the patient record.  Previous Psychotropic Medications: {YES/NO:21197}  Substance Abuse History in the last 12 months:  {yes no:314532}  Consequences of Substance Abuse: {BHH CONSEQUENCES OF SUBSTANCE ABUSE:22880}  Past Medical History:  Past Medical History:  Diagnosis Date  . Allergy    seasonal  . Anxiety   . Arthritis    deterioration of spine, knees  . Clotting disorder (HCC)    antiphospholipid syndrome  . Depression     Past Surgical History:  Procedure Laterality Date  . ABDOMINAL HYSTERECTOMY  2011   partial - bleeding  . EYE SURGERY     lazy eye    Family Psychiatric History: ***  Family History:  Family History  Problem Relation Age of Onset  . Cancer Mother        breast   . Arthritis Mother   . Hypertension Mother   . Thyroid disease Father   . Mental illness Sister        depression ? bipolar  . Dementia Maternal Grandmother   . Cancer Maternal Grandfather   . Heart disease Paternal Grandmother   . Cancer Paternal Grandfather     Social History:   Social History   Social History  . Marital status: Married    Spouse name: N/A  . Number of children: N/A  . Years of education: N/A   Social History Main  Topics  . Smoking status: Current Every Day Smoker    Packs/day: 1.00    Years: 20.00  . Smokeless tobacco: Never Used  . Alcohol use No  . Drug use: No  . Sexual activity: Yes    Birth control/ protection: Surgical   Other Topics Concern  . Not on file   Social History Narrative  . No narrative on file    Additional Social History: ***  Allergies:  No Known Allergies  Metabolic Disorder Labs: No results found for: HGBA1C, MPG No results found for: PROLACTIN No results found for: CHOL, TRIG, HDL, CHOLHDL, VLDL, LDLCALC   Current Medications: Current Outpatient Prescriptions  Medication Sig Dispense Refill  . ALPRAZolam (XANAX) 0.25 MG tablet Take 1 tablet (0.25 mg total) by mouth 2 (two) times daily as needed for anxiety. 20 tablet 0  . citalopram (CELEXA) 20 MG tablet Take 2 tablets (40 mg total) by mouth daily. 60 tablet 0  . Melatonin 3 MG TABS Take by mouth.    . meloxicam (MOBIC) 15 MG tablet Take 1 tablet (15 mg total) by mouth daily. TAKE WITH FOOD 90 tablet 3   No current facility-administered medications for this visit.     Neurologic: Headache: No Seizure: No Paresthesias:No  Musculoskeletal: Strength & Muscle Tone: within normal limits Gait & Station: normal Patient leans: N/A  Psychiatric Specialty Exam: ROS  There were no vitals taken for this visit.There is no height or weight on file  to calculate BMI.  General Appearance: Fairly Groomed  Eye Contact:  Good  Speech:  Clear and Coherent  Volume:  Normal  Mood:  {BHH MOOD:22306}  Affect:  {Affect (PAA):22687}  Thought Process:  Coherent and Goal Directed  Orientation:  Full (Time, Place, and Person)  Thought Content:  Logical  Suicidal Thoughts:  {ST/HT (PAA):22692}  Homicidal Thoughts:  {ST/HT (PAA):22692}  Memory:  Immediate;   Good Recent;   Good Remote;   Good  Judgement:  {Judgement (PAA):22694}  Insight:  {Insight (PAA):22695}  Psychomotor Activity:  Normal  Concentration:   Concentration: Good and Attention Span: Good  Recall:  Good  Fund of Knowledge:Good  Language: Good  Akathisia:  No  Handed:  Ambidextrous  AIMS (if indicated):  N/A  Assets:  Communication Skills Desire for Improvement  ADL's:  Intact  Cognition: WNL  Sleep:  ***   Assessment  Plan  The patient demonstrates the following risk factors for suicide: Chronic risk factors for suicide include: {Chronic Risk Factors for ZOXWRUE:45409811}. Acute risk factors for suicide include: {Acute Risk Factors for BJYNWGN:56213086}. Protective factors for this patient include: {Protective Factors for Suicide VHQI:69629528}. Considering these factors, the overall suicide risk at this point appears to be {Desc; low/moderate/high:110033}. Patient {ACTION; IS/IS UXL:24401027} appropriate for outpatient follow up.   Treatment Plan Summary: Plan as above   Norman Clay, MD 9/26/20188:29 AM

## 2017-03-27 ENCOUNTER — Ambulatory Visit (HOSPITAL_COMMUNITY): Payer: Self-pay | Admitting: Psychiatry

## 2017-03-27 ENCOUNTER — Telehealth (HOSPITAL_COMMUNITY): Payer: Self-pay | Admitting: *Deleted

## 2017-03-27 NOTE — Telephone Encounter (Signed)
returned phone call to patient, she left voice message this morning at 7:49 a.m. and it was not clear.  patient did not answer, left voice message.

## 2017-04-04 ENCOUNTER — Telehealth (HOSPITAL_COMMUNITY): Payer: Self-pay

## 2017-04-18 NOTE — Progress Notes (Deleted)
Psychiatric Initial Adult Assessment   Patient Identification: Diana Jensen MRN:  [id number] Date of Evaluation:  04/18/2017 Referral Source: *** Chief Complaint:   Visit Diagnosis: No diagnosis found.  History of Present Illness:   Diana Jensen is a 41 y.o. year old female with a history of depression, antiphospholipid syndrome, who presents for depression.      Associated Signs/Symptoms: Depression Symptoms:  {DEPRESSION SYMPTOMS:20000} (Hypo) Manic Symptoms:  {BHH MANIC SYMPTOMS:22872} Anxiety Symptoms:  {BHH ANXIETY SYMPTOMS:22873} Psychotic Symptoms:  {BHH PSYCHOTIC SYMPTOMS:22874} PTSD Symptoms: {BHH PTSD SYMPTOMS:22875}  Past Psychiatric History:  I have reviewed the patient's psychiatry history in detail and updated the patient record.  Previous Psychotropic Medications: {YES/NO:21197}  Substance Abuse History in the last 12 months:  {yes no:314532}  Consequences of Substance Abuse: {BHH CONSEQUENCES OF SUBSTANCE ABUSE:22880}  Past Medical History:  Past Medical History:  Diagnosis Date  . Allergy    seasonal  . Anxiety   . Arthritis    deterioration of spine, knees  . Clotting disorder (HCC)    antiphospholipid syndrome  . Depression     Past Surgical History:  Procedure Laterality Date  . ABDOMINAL HYSTERECTOMY  2011   partial - bleeding  . EYE SURGERY     lazy eye    Family Psychiatric History: ***  Family History:  Family History  Problem Relation Age of Onset  . Cancer Mother        breast   . Arthritis Mother   . Hypertension Mother   . Thyroid disease Father   . Mental illness Sister        depression ? bipolar  . Dementia Maternal Grandmother   . Cancer Maternal Grandfather   . Heart disease Paternal Grandmother   . Cancer Paternal Grandfather     Social History:   Social History   Social History  . Marital status: Married    Spouse name: N/A  . Number of children: N/A  . Years of education: N/A   Social History Main  Topics  . Smoking status: Current Every Day Smoker    Packs/day: 1.00    Years: 20.00  . Smokeless tobacco: Never Used  . Alcohol use No  . Drug use: No  . Sexual activity: Yes    Birth control/ protection: Surgical   Other Topics Concern  . Not on file   Social History Narrative  . No narrative on file    Additional Social History: ***  Allergies:  No Known Allergies  Metabolic Disorder Labs: No results found for: HGBA1C, MPG No results found for: PROLACTIN No results found for: CHOL, TRIG, HDL, CHOLHDL, VLDL, LDLCALC   Current Medications: Current Outpatient Prescriptions  Medication Sig Dispense Refill  . ALPRAZolam (XANAX) 0.25 MG tablet Take 1 tablet (0.25 mg total) by mouth 2 (two) times daily as needed for anxiety. 20 tablet 0  . citalopram (CELEXA) 20 MG tablet Take 2 tablets (40 mg total) by mouth daily. 60 tablet 0  . Melatonin 3 MG TABS Take by mouth.    . meloxicam (MOBIC) 15 MG tablet Take 1 tablet (15 mg total) by mouth daily. TAKE WITH FOOD 90 tablet 3   No current facility-administered medications for this visit.     Neurologic: Headache: No Seizure: No Paresthesias:No  Musculoskeletal: Strength & Muscle Tone: within normal limits Gait & Station: normal Patient leans: N/A  Psychiatric Specialty Exam: ROS  There were no vitals taken for this visit.There is no height or weight on file  to calculate BMI.  General Appearance: Fairly Groomed  Eye Contact:  Good  Speech:  Clear and Coherent  Volume:  Normal  Mood:  {BHH MOOD:22306}  Affect:  {Affect (PAA):22687}  Thought Process:  Coherent and Goal Directed  Orientation:  Full (Time, Place, and Person)  Thought Content:  Logical  Suicidal Thoughts:  {ST/HT (PAA):22692}  Homicidal Thoughts:  {ST/HT (PAA):22692}  Memory:  Immediate;   Good Recent;   Good Remote;   Good  Judgement:  {Judgement (PAA):22694}  Insight:  {Insight (PAA):22695}  Psychomotor Activity:  Normal  Concentration:   Concentration: Good and Attention Span: Good  Recall:  Good  Fund of Knowledge:Good  Language: Good  Akathisia:  No  Handed:  Ambidextrous  AIMS (if indicated):  N/A  Assets:  Communication Skills Desire for Improvement  ADL's:  Intact  Cognition: WNL  Sleep:  ***   Assessment  Plan  The patient demonstrates the following risk factors for suicide: Chronic risk factors for suicide include: {Chronic Risk Factors for PJKDTOI:71245809}. Acute risk factors for suicide include: {Acute Risk Factors for XIPJASN:05397673}. Protective factors for this patient include: {Protective Factors for Suicide ALPF:79024097}. Considering these factors, the overall suicide risk at this point appears to be {Desc; low/moderate/high:110033}. Patient {ACTION; IS/IS DZH:29924268} appropriate for outpatient follow up.   Treatment Plan Summary: Plan as above   Diana Clay, MD 10/24/201812:38 PM

## 2017-04-23 ENCOUNTER — Telehealth (HOSPITAL_COMMUNITY): Payer: Self-pay | Admitting: *Deleted

## 2017-04-23 NOTE — Telephone Encounter (Signed)
phone call from [facility], said he was patient;s husband, said he wanted to cancel appointment for 04/24/17.   Due to confidentiality, asked husband to have his wife call.   Husband said he made the appointment and he wants to reschedule the appointment.  The husband was told that we need to speak with the patient.  At 3:06 p.m the wife called and said she wants to cancel her appointment for 04/24/17, said take her off the list, she was going some where else to be seen.  In looking at the schedule the appointment for 04/24/17 was cancelled by auto reminder call at 12:00.   The patient cancelled an appointment on 03/23/17, did not show on 03/27/17, and cancelled for 04/24/17.

## 2017-04-24 ENCOUNTER — Ambulatory Visit (HOSPITAL_COMMUNITY): Payer: Self-pay | Admitting: Psychiatry

## 2017-05-03 ENCOUNTER — Telehealth: Payer: Self-pay | Admitting: *Deleted

## 2017-05-03 NOTE — Telephone Encounter (Signed)
Should not need referral.  Can go where she desires

## 2017-05-03 NOTE — Telephone Encounter (Signed)
patientt is requesting a referral to [place] for mental health. Patient states [facility] next [place] is not the place for her. Please advise

## 2017-05-04 NOTE — Telephone Encounter (Signed)
I have tried to call Diana Jensen, her mailbox is full and cannot accept messages.

## 2017-05-07 ENCOUNTER — Other Ambulatory Visit: Payer: Self-pay | Admitting: Family Medicine

## 2017-05-07 MED ORDER — CITALOPRAM HYDROBROMIDE 20 MG PO TABS: 40.0000 mg | ORAL_TABLET | Freq: Every day | ORAL | 0 refills | Status: DC

## 2017-05-07 NOTE — Telephone Encounter (Signed)
Patient left messages with questions about her medications and a referral. Please advise [phone]

## 2017-05-07 NOTE — Telephone Encounter (Signed)
Seen [phone]   Pt asking for a 30 day supply of celexa til she gets in with mental health

## 2017-05-28 ENCOUNTER — Telehealth (HOSPITAL_COMMUNITY): Payer: Self-pay

## 2017-05-28 NOTE — Telephone Encounter (Signed)
[place]  [facility] Follow Up   Voice mail box is full and was not able to leave a message.

## 2017-07-03 ENCOUNTER — Telehealth: Payer: Self-pay | Admitting: Family Medicine

## 2017-07-03 NOTE — Telephone Encounter (Signed)
The husband called back in, I advised that we would put Diana Jensen on the Cancellation list, and would call him if someone cancelled.

## 2017-07-03 NOTE — Telephone Encounter (Signed)
Do not call in antibiotics for cough illness.  Needs to be seen

## 2017-07-03 NOTE — Telephone Encounter (Signed)
SP is calling in and states that the wife has fever, coughing weak, sore throat, she may have what he had.. Does she need to come in the office, or can you call her in something

## 2017-07-04 NOTE — Telephone Encounter (Signed)
Thank you :)

## 2017-09-03 ENCOUNTER — Encounter: Payer: Self-pay | Admitting: Family Medicine

## 2017-09-14 ENCOUNTER — Telehealth: Payer: Self-pay

## 2017-09-14 NOTE — Telephone Encounter (Signed)
Re-mailed batch letter from 09/03/17 on 09/14/17.  Pt said she did not receive [place] letter.

## 2017-09-17 ENCOUNTER — Other Ambulatory Visit: Payer: Self-pay

## 2017-09-17 ENCOUNTER — Encounter: Payer: Self-pay | Admitting: Family Medicine

## 2017-09-17 ENCOUNTER — Ambulatory Visit: Payer: BC Managed Care – PPO | Admitting: Family Medicine

## 2017-09-17 VITALS — BP 118/88 | HR 76 | Temp 97.8°F | Resp 16 | Ht 67.0 in | Wt 239.0 lb

## 2017-09-17 DIAGNOSIS — F3341 Major depressive disorder, recurrent, in partial remission: Secondary | ICD-10-CM | POA: Diagnosis not present

## 2017-09-17 DIAGNOSIS — R5383 Other fatigue: Secondary | ICD-10-CM

## 2017-09-17 DIAGNOSIS — Z803 Family history of malignant neoplasm of breast: Secondary | ICD-10-CM | POA: Diagnosis not present

## 2017-09-17 LAB — CBC
HEMATOCRIT: 42.2 % (ref 35.0–45.0)
Hemoglobin: 14.8 g/dL (ref 11.7–15.5)
MCH: 29.1 pg (ref 27.0–33.0)
MCHC: 35.1 g/dL (ref 32.0–36.0)
MCV: 83.1 fL (ref 80.0–100.0)
MPV: 10.6 fL (ref 7.5–12.5)
Platelets: 288 10*3/uL (ref 140–400)
RBC: 5.08 10*6/uL (ref 3.80–5.10)
RDW: 12.9 % (ref 11.0–15.0)
WBC: 7.4 10*3/uL (ref 3.8–10.8)

## 2017-09-17 LAB — COMPLETE METABOLIC PANEL WITH GFR
AG Ratio: 2 (calc) (ref 1.0–2.5)
ALBUMIN MSPROF: 4.7 g/dL (ref 3.6–5.1)
ALT: 20 U/L (ref 6–29)
AST: 14 U/L (ref 10–30)
Alkaline phosphatase (APISO): 79 U/L (ref 33–115)
BUN: 13 mg/dL (ref 7–25)
CALCIUM: 9.5 mg/dL (ref 8.6–10.2)
CO2: 26 mmol/L (ref 20–32)
CREATININE: 0.84 mg/dL (ref 0.50–1.10)
Chloride: 106 mmol/L (ref 98–110)
GFR, EST NON AFRICAN AMERICAN: 88 mL/min/{1.73_m2} (ref >=60)
GFR, Est African American: 101 mL/min/{1.73_m2} (ref >=60)
GLOBULIN: 2.4 g/dL (ref 1.9–3.7)
Glucose, Bld: 92 mg/dL (ref 65–99)
Potassium: 4.2 mmol/L (ref 3.5–5.3)
SODIUM: 139 mmol/L (ref 135–146)
Total Bilirubin: 0.6 mg/dL (ref 0.2–1.2)
Total Protein: 7.1 g/dL (ref 6.1–8.1)

## 2017-09-17 LAB — TSH: TSH: 1.79 mIU/L

## 2017-09-17 MED ORDER — MELOXICAM 15 MG PO TABS: 15.0000 mg | ORAL_TABLET | Freq: Every day | ORAL | 0 refills | Status: DC

## 2017-09-17 NOTE — Progress Notes (Signed)
Chief Complaint  Patient presents with  . Follow-up   Patient is here for on the advice of her psychiatrist.  The psychiatrist wanted her tested for thyroid disease, and other appropriate labs because of her fatigue and depression. She is currently taking duloxetine daily.  She feels this is helpful. She still has problems with anxiety.  No recent panic. She continues to smoke cigarettes.  The importance of smoking cessation was discussed with her.  She feels unable to quit at this time. We discussed the importance of a heart healthy diet, and regular exercise for her overall health and well-being. Her mother had breast cancer.  I have previously ordered a mammogram.  She has not had this done.  I will reorder it again today.  Patient Active Problem List   Diagnosis Date Noted  . 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 Antiphospholipid syndrome ([place]) 02/22/2016  . Tobacco abuse 02/22/2016  . Family history of breast cancer 02/22/2016  . Lumbago 02/22/2016  . Sleep disorder 02/22/2016  . Depression, major, recurrent, in partial remission ([place]) 02/22/2016    Outpatient Encounter Medications as of 09/17/2017  Medication Sig  . DULoxetine (CYMBALTA) 60 MG capsule Take 60 mg by mouth daily.  . Melatonin 2.5 MG CHEW Chew 5 mg by mouth at bedtime.  . meloxicam (MOBIC) 15 MG tablet Take 1 tablet (15 mg total) by mouth daily. TAKE WITH FOOD   No facility-administered encounter medications on file as of 09/17/2017.     No Known Allergies  Review of Systems  Constitutional: Positive for fatigue. Negative for activity change, appetite change and unexpected weight change.  HENT: Negative for congestion, dental problem, postnasal drip and rhinorrhea.   Eyes: Negative for redness and visual disturbance.  Respiratory: Negative for cough and shortness of breath.   Cardiovascular: Negative for chest pain, palpitations and leg swelling.  Gastrointestinal: Negative for abdominal pain,  constipation and diarrhea.  Genitourinary: Negative for difficulty urinating, frequency and menstrual problem.  Musculoskeletal: Negative for arthralgias and back pain.  Neurological: Negative for dizziness and headaches.  Psychiatric/Behavioral: Positive for dysphoric mood. Negative for sleep disturbance. The patient is nervous/anxious.        Improving on medication    BP 118/88   Pulse 76   Temp 97.8 F (36.6 C)   Resp 16   Ht 5\' 7"  (1.702 m)   Wt 239 lb 0.6 oz (108.4 kg)   SpO2 97%   BMI 37.44 kg/m   Physical Exam  Constitutional: She is oriented to person, place, and time. She appears well-developed and well-nourished. No distress.  obese  HENT:  Head: Normocephalic and atraumatic.  Right Ear: External ear normal.  Left Ear: External ear normal.  Mouth/Throat: Oropharynx is clear and moist.  Eyes: Pupils are equal, round, and reactive to light. Conjunctivae are normal.  strabismus  Neck: Normal range of motion. Neck supple. No thyromegaly present.  Cardiovascular: Normal rate, regular rhythm and normal heart sounds.  Pulmonary/Chest: Effort normal and breath sounds normal. No respiratory distress.  Abdominal: Soft. Bowel sounds are normal. She exhibits no distension. There is no tenderness.  Musculoskeletal: Normal range of motion.  Lymphadenopathy:    She has no cervical adenopathy.  Neurological: She is alert and oriented to person, place, and time.  Skin: Skin is warm and dry.  Psychiatric: She has a normal mood and affect. Her behavior is normal. Judgment and thought content normal.    ASSESSMENT/PLAN:  1. Depression, major, recurrent, in partial  remission ([place]) Under care of psychiatrist.  On duloxetine  2. Family history of breast cancer  - MM Digital Screening; Future  3. Fatigue, unspecified type  - TSH - COMPLETE METABOLIC PANEL WITH GFR - CBC   Patient Instructions  I have ordered blood tests I will send you a copy of the results I re ordered  the mammogram  Need yearly check ups   Raylene Everts, MD

## 2017-09-17 NOTE — Patient Instructions (Signed)
I have ordered blood tests I will send you a copy of the results I re ordered the mammogram  Need yearly check ups

## 2017-11-20 ENCOUNTER — Telehealth: Payer: Self-pay

## 2017-11-20 NOTE — Telephone Encounter (Signed)
Pt calling re her 47yr old daughter who has started her periods which are long and heavy.  [phone]  Adv I can't adv since we haven't seen the daughter.  I can tell her to start c pediatrician.

## 2017-11-30 ENCOUNTER — Encounter: Payer: Self-pay | Admitting: Family Medicine

## 2018-01-02 ENCOUNTER — Ambulatory Visit: Payer: Self-pay | Admitting: Family Medicine

## 2018-01-27 ENCOUNTER — Emergency Department (HOSPITAL_COMMUNITY)
Admission: EM | Admit: 2018-01-27 | Discharge: 2018-01-27 | Disposition: A | Discharge status: Home / Self Care | Payer: BC Managed Care – PPO | Attending: Emergency Medicine | Admitting: Emergency Medicine

## 2018-01-27 ENCOUNTER — Other Ambulatory Visit: Payer: Self-pay

## 2018-01-27 ENCOUNTER — Emergency Department (HOSPITAL_COMMUNITY): Payer: BC Managed Care – PPO

## 2018-01-27 ENCOUNTER — Encounter (HOSPITAL_COMMUNITY): Payer: Self-pay | Admitting: Emergency Medicine

## 2018-01-27 DIAGNOSIS — R51 Headache: Secondary | ICD-10-CM | POA: Diagnosis present

## 2018-01-27 DIAGNOSIS — R519 Headache, unspecified: Secondary | ICD-10-CM

## 2018-01-27 DIAGNOSIS — Z79899 Other long term (current) drug therapy: Secondary | ICD-10-CM | POA: Insufficient documentation

## 2018-01-27 DIAGNOSIS — F17210 Nicotine dependence, cigarettes, uncomplicated: Secondary | ICD-10-CM | POA: Insufficient documentation

## 2018-01-27 LAB — COMPREHENSIVE METABOLIC PANEL
ALBUMIN: 4.4 g/dL (ref 3.5–5.0)
ALK PHOS: 75 U/L (ref 38–126)
ALT: 23 U/L (ref 0–44)
ANION GAP: 11 (ref 5–15)
AST: 20 U/L (ref 15–41)
BUN: 13 mg/dL (ref 6–20)
CALCIUM: 9.3 mg/dL (ref 8.9–10.3)
CO2: 22 mmol/L (ref 22–32)
CREATININE: 0.74 mg/dL (ref 0.44–1.00)
Chloride: 107 mmol/L (ref 98–111)
GFR calc Af Amer: >60 mL/min (ref >60)
GFR calc non Af Amer: >60 mL/min (ref >60)
GLUCOSE: 104 mg/dL — AB (ref 70–99)
Potassium: 3.8 mmol/L (ref 3.5–5.1)
SODIUM: 140 mmol/L (ref 135–145)
Total Bilirubin: 0.6 mg/dL (ref 0.3–1.2)
Total Protein: 7.6 g/dL (ref 6.5–8.1)

## 2018-01-27 LAB — CBC WITH DIFFERENTIAL/PLATELET
BASOS PCT: 0 %
Basophils Absolute: 0 10*3/uL (ref 0.0–0.1)
EOS ABS: 0.3 10*3/uL (ref 0.0–0.7)
Eosinophils Relative: 3 %
HCT: 44.7 % (ref 36.0–46.0)
HEMOGLOBIN: 15.2 g/dL — AB (ref 12.0–15.0)
Lymphocytes Relative: 14 %
Lymphs Abs: 1.7 10*3/uL (ref 0.7–4.0)
MCH: 29.5 pg (ref 26.0–34.0)
MCHC: 34 g/dL (ref 30.0–36.0)
MCV: 86.8 fL (ref 78.0–100.0)
Monocytes Absolute: 0.4 10*3/uL (ref 0.1–1.0)
Monocytes Relative: 3 %
NEUTROS PCT: 80 %
Neutro Abs: 9.5 10*3/uL — ABNORMAL HIGH (ref 1.7–7.7)
Platelets: 286 10*3/uL (ref 150–400)
RBC: 5.15 MIL/uL — AB (ref 3.87–5.11)
RDW: 12.5 % (ref 11.5–15.5)
WBC: 11.9 10*3/uL — AB (ref 4.0–10.5)

## 2018-01-27 MED ORDER — KETOROLAC TROMETHAMINE 30 MG/ML IJ SOLN
30.0000 mg | Freq: Once | INTRAMUSCULAR | Status: AC
  Administered 2018-01-27: 30 mg via INTRAVENOUS
  Filled 2018-01-27: qty 1

## 2018-01-27 MED ORDER — METOCLOPRAMIDE HCL 5 MG/ML IJ SOLN
10.0000 mg | Freq: Once | INTRAMUSCULAR | Status: AC
  Administered 2018-01-27: 10 mg via INTRAVENOUS
  Filled 2018-01-27: qty 2

## 2018-01-27 MED ORDER — SODIUM CHLORIDE 0.9 % IV BOLUS
1000.0000 mL | Freq: Once | INTRAVENOUS | Status: AC
  Administered 2018-01-27: 1000 mL via INTRAVENOUS

## 2018-01-27 MED ORDER — METHYLPREDNISOLONE SODIUM SUCC 125 MG IJ SOLR
125.0000 mg | Freq: Once | INTRAMUSCULAR | Status: AC
  Administered 2018-01-27: 125 mg via INTRAVENOUS
  Filled 2018-01-27: qty 2

## 2018-01-27 MED ORDER — DIPHENHYDRAMINE HCL 50 MG/ML IJ SOLN
25.0000 mg | Freq: Once | INTRAMUSCULAR | Status: AC
  Administered 2018-01-27: 25 mg via INTRAVENOUS
  Filled 2018-01-27: qty 1

## 2018-01-27 MED ORDER — METOCLOPRAMIDE HCL 10 MG PO TABS: 10.0000 mg | ORAL_TABLET | Freq: Four times a day (QID) | ORAL | 0 refills | Status: DC | PRN

## 2018-01-27 NOTE — ED Triage Notes (Addendum)
Patient c/o headache that started yesterday and is progressively getting worse. Per patient blurred vision with nausea and vomiting. Patient also reports numbness in hands and feet bilaterally. Denies any dizziness, facial drooping, blurred vision, weakness, or slurred speech. Denies any photosensitivity.

## 2018-01-27 NOTE — ED Provider Notes (Signed)
[facility] EMERGENCY DEPARTMENT Provider Note   CSN: [id number] Arrival date & time: 01/27/18  1332     History   Chief Complaint Chief Complaint  Patient presents with  . Headache    HPI Diana Jensen is a 42 y.o. female.  HPI Patient complains of a headache that she woke up with yesterday morning.  States that the pain started in the left posterior neck and is now global.  She denies any fever or chills.  She has had some blurred vision and numbness to bilateral hands and feet.  She endorses nausea with several episodes of vomiting.  She is taking ibuprofen and Tylenol with little relief.  No photo or phono sensitivity.  No focal weakness.  Patient reports she has had headaches in the past but never this bad. Past Medical History:  Diagnosis Date  . Allergy    seasonal  . Anxiety   . Arthritis    deterioration of spine, knees  . Clotting disorder (HCC)    antiphospholipid syndrome  . Depression     Patient Active Problem List   Diagnosis Date Noted  . 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 Antiphospholipid syndrome ([place]) 02/22/2016  . Tobacco abuse 02/22/2016  . Family history of breast cancer 02/22/2016  . Lumbago 02/22/2016  . Sleep disorder 02/22/2016  . Depression, major, recurrent, in partial remission ([place]) 02/22/2016    Past Surgical History:  Procedure Laterality Date  . ABDOMINAL HYSTERECTOMY  2011   partial - bleeding  . EYE SURGERY     lazy eye     OB History   None      Home Medications    Prior to Admission medications   Medication Sig Start Date End Date Taking? Authorizing Provider  acetaminophen (TYLENOL) 500 MG tablet Take 1,000 mg by mouth every 6 (six) hours as needed.   Yes [provider]  DULoxetine (CYMBALTA) 60 MG capsule Take 60 mg by mouth daily. 09/14/17  Yes [provider]  ibuprofen (ADVIL,MOTRIN) 200 MG tablet Take 600 mg by mouth every 6 (six) hours as needed.   Yes [provider]    Melatonin 2.5 MG CHEW Chew 5 mg by mouth at bedtime as needed (for rest).    Yes [provider]  meloxicam (MOBIC) 15 MG tablet Take 1 tablet (15 mg total) by mouth daily. TAKE WITH FOOD 09/17/17  Yes Raylene Everts, MD  metoCLOPramide (REGLAN) 10 MG tablet Take 1 tablet (10 mg total) by mouth every 6 (six) hours as needed for nausea (nausea/headache). 01/27/18   Julianne Rice, MD    Family History Family History  Problem Relation Age of Onset  . Cancer Mother        breast   . Arthritis Mother   . Hypertension Mother   . Thyroid disease Father   . Mental illness Sister        depression ? bipolar  . Dementia Maternal Grandmother   . Cancer Maternal Grandfather   . Heart disease Paternal Grandmother   . Cancer Paternal Grandfather     Social History Social History   Tobacco Use  . Smoking status: Current Every Day Smoker    Packs/day: 1.00    Years: 20.00    Pack years: 20.00    Types: Cigarettes  . Smokeless tobacco: Never Used  Substance Use Topics  . Alcohol use: No  . Drug use: No     Allergies   Patient has no known allergies.  Review of Systems Review of Systems  Constitutional: Negative for chills and fever.  HENT: Negative for congestion, sinus pressure, sinus pain, sore throat and trouble swallowing.   Eyes: Positive for visual disturbance. Negative for photophobia and pain.  Respiratory: Negative for cough and shortness of breath.   Cardiovascular: Negative for chest pain, palpitations and leg swelling.  Gastrointestinal: Positive for nausea and vomiting. Negative for abdominal pain, constipation and diarrhea.  Genitourinary: Negative for dysuria, flank pain, frequency and hematuria.  Musculoskeletal: Positive for myalgias and neck pain. Negative for back pain and neck stiffness.  Skin: Negative for rash and wound.  Neurological: Positive for numbness and headaches. Negative for dizziness, speech difficulty, weakness and light-headedness.      Physical Exam Updated Vital Signs BP 139/90   Pulse 69   Temp 98.1 F (36.7 C) (Oral)   Resp 16   Ht 5\' 7"  (1.702 m)   Wt 108.9 kg (240 lb)   SpO2 96%   BMI 37.59 kg/m   Physical Exam  Constitutional: She is oriented to person, place, and time. She appears well-developed and well-nourished.  HENT:  Head: Normocephalic and atraumatic.  Mouth/Throat: Oropharynx is clear and moist. No oropharyngeal exudate.  No temporal artery tenderness to palpation.  Eyes: Pupils are equal, round, and reactive to light. EOM are normal.  Neck: Normal range of motion. Neck supple.  No meningismus.  Mild left-sided paracervical tenderness to palpation.  Cardiovascular: Normal rate and regular rhythm. Exam reveals no gallop and no friction rub.  No murmur heard. Pulmonary/Chest: Effort normal and breath sounds normal. No stridor. No respiratory distress. She has no wheezes. She has no rales. She exhibits no tenderness.  Abdominal: Soft. Bowel sounds are normal. There is no tenderness. There is no rebound and no guarding.  Musculoskeletal: Normal range of motion. She exhibits no edema or tenderness.  No lower extremity swelling, asymmetry or tenderness.  Distal pulses intact.  Neurological: She is alert and oriented to person, place, and time.  5/5 motor all extremities.  Mildly decreased sensation to the distal bilateral extremities.  Skin: Skin is warm and dry. Capillary refill takes less than 2 seconds. No rash noted. No erythema.  Psychiatric: She has a normal mood and affect. Her behavior is normal.  Nursing note and vitals reviewed.    ED Treatments / Results  Labs (all labs ordered are listed, but only abnormal results are displayed) Labs Reviewed  CBC WITH DIFFERENTIAL/PLATELET - Abnormal; Notable for the following components:      Result Value   WBC 11.9 (*)    RBC 5.15 (*)    Hemoglobin 15.2 (*)    Neutro Abs 9.5 (*)    All other components within normal limits   COMPREHENSIVE METABOLIC PANEL - Abnormal; Notable for the following components:   Glucose, Bld 104 (*)    All other components within normal limits    EKG None  Radiology Ct Head Wo Contrast  Result Date: 01/27/2018 CLINICAL DATA:  Progressively worsening headache since yesterday. Blurry vision, nausea and vomiting. Extremity numbness. History of anti phospholipid syndrome. EXAM: CT HEAD WITHOUT CONTRAST TECHNIQUE: Contiguous axial images were obtained from the base of the skull through the vertex without intravenous contrast. COMPARISON:  CT HEAD March 31, 2013 FINDINGS: BRAIN: No intraparenchymal hemorrhage, mass effect nor midline shift. The ventricles and sulci are normal. No acute large vascular territory infarcts. RIGHT inferior basal ganglia perivascular space. No abnormal extra-axial fluid collections. Basal cisterns are patent. VASCULAR: Mildly dolichoectatic intracranial  vessels. SKULL/SOFT TISSUES: No skull fracture. No significant soft tissue swelling. ORBITS/SINUSES: The included ocular globes and orbital contents are normal.Trace paranasal sinus mucosal thickening. Mastoid air cells are well aerated. OTHER: None. IMPRESSION: 1. No acute intracranial process. 2. Dolichoectatic intracranial vessels associated with chronic hypertension. 3. Otherwise negative noncontrast CT HEAD. Electronically Signed   By: Elon Alas M.D.   On: 01/27/2018 17:26    Procedures Procedures (including critical care time)  Medications Ordered in ED Medications  ketorolac (TORADOL) 30 MG/ML injection 30 mg (has no administration in time range)  sodium chloride 0.9 % bolus 1,000 mL (0 mLs Intravenous Stopped 01/27/18 1759)  metoCLOPramide (REGLAN) injection 10 mg (10 mg Intravenous Given 01/27/18 1647)  diphenhydrAMINE (BENADRYL) injection 25 mg (25 mg Intravenous Given 01/27/18 1647)  methylPREDNISolone sodium succinate (SOLU-MEDROL) 125 mg/2 mL injection 125 mg (125 mg Intravenous Given 01/27/18 1647)      Initial Impression / Assessment and Plan / ED Course  I have reviewed the triage vital signs and the nursing notes.  Pertinent labs & imaging results that were available during my care of the patient were reviewed by me and considered in my medical decision making (see chart for details).    Patient states her headache is nearly completely resolved.  Vital signs remained stable.  CT head without acute findings.  Suspect tension versus migraine headache.  She is advised to follow-up closely with neurology.  Return precautions have been given.   Final Clinical Impressions(s) / ED Diagnoses   Final diagnoses:  Acute nonintractable headache, unspecified headache type    ED Discharge Orders        Ordered    metoCLOPramide (REGLAN) 10 MG tablet  Every 6 hours PRN     01/27/18 1813       Julianne Rice, MD 01/27/18 1815

## 2018-01-28 ENCOUNTER — Emergency Department (HOSPITAL_COMMUNITY)
Admission: EM | Admit: 2018-01-28 | Discharge: 2018-01-28 | Disposition: A | Discharge status: Home / Self Care | Payer: BC Managed Care – PPO | Attending: Emergency Medicine | Admitting: Emergency Medicine

## 2018-01-28 ENCOUNTER — Encounter (HOSPITAL_COMMUNITY): Payer: Self-pay | Admitting: Emergency Medicine

## 2018-01-28 ENCOUNTER — Other Ambulatory Visit: Payer: Self-pay

## 2018-01-28 DIAGNOSIS — R51 Headache: Secondary | ICD-10-CM | POA: Insufficient documentation

## 2018-01-28 DIAGNOSIS — F17210 Nicotine dependence, cigarettes, uncomplicated: Secondary | ICD-10-CM | POA: Diagnosis not present

## 2018-01-28 DIAGNOSIS — Z79899 Other long term (current) drug therapy: Secondary | ICD-10-CM | POA: Diagnosis not present

## 2018-01-28 DIAGNOSIS — R519 Headache, unspecified: Secondary | ICD-10-CM

## 2018-01-28 MED ORDER — METOCLOPRAMIDE HCL 5 MG/ML IJ SOLN
10.0000 mg | Freq: Once | INTRAMUSCULAR | Status: AC
  Administered 2018-01-28: 10 mg via INTRAVENOUS
  Filled 2018-01-28: qty 2

## 2018-01-28 MED ORDER — SODIUM CHLORIDE 0.9 % IV BOLUS
1000.0000 mL | Freq: Once | INTRAVENOUS | Status: AC
  Administered 2018-01-28: 1000 mL via INTRAVENOUS

## 2018-01-28 MED ORDER — HYDROCODONE-ACETAMINOPHEN 5-325 MG PO TABS: 1.0000 | ORAL_TABLET | Freq: Four times a day (QID) | ORAL | 0 refills | Status: DC | PRN

## 2018-01-28 MED ORDER — DIPHENHYDRAMINE HCL 50 MG/ML IJ SOLN
25.0000 mg | Freq: Once | INTRAMUSCULAR | Status: AC
  Administered 2018-01-28: 25 mg via INTRAVENOUS
  Filled 2018-01-28: qty 1

## 2018-01-28 MED ORDER — KETOROLAC TROMETHAMINE 30 MG/ML IJ SOLN
30.0000 mg | Freq: Once | INTRAMUSCULAR | Status: AC
  Administered 2018-01-28: 30 mg via INTRAVENOUS
  Filled 2018-01-28: qty 1

## 2018-01-28 NOTE — ED Triage Notes (Signed)
Pt reports she was seen for the same yesterday, did not get her prescription filled. States the HA is a shooting pain that comes and goes and has N/. Pain did improve after migraine cocktail yesterday in ED. Checked her BP today at pharmacy and it was 150/98.

## 2018-01-28 NOTE — Discharge Instructions (Addendum)
Follow-up with your family doctor 

## 2018-01-28 NOTE — ED Provider Notes (Signed)
[facility] EMERGENCY DEPARTMENT Provider Note   CSN: [id number] Arrival date & time: 01/28/18  1314     History   Chief Complaint Chief Complaint  Patient presents with  . Headache    HPI Diana Jensen is a 42 y.o. female.  Patient complains of a headache.  Patient was seen yesterday and treated with migraine cocktail and got better.  She was sent home with some Reglan.  The history is provided by the patient. No language interpreter was used.  Headache   This is a recurrent problem. The current episode started 6 to 12 hours ago. The problem occurs constantly. The problem has not changed since onset.The headache is associated with nothing. The pain is located in the bilateral region. The quality of the pain is described as dull. The pain is at a severity of 4/10. The pain is moderate. The pain does not radiate. Pertinent negatives include no anorexia.    Past Medical History:  Diagnosis Date  . Allergy    seasonal  . Anxiety   . Arthritis    deterioration of spine, knees  . Clotting disorder (HCC)    antiphospholipid syndrome  . Depression     Patient Active Problem List   Diagnosis Date Noted  . 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 Antiphospholipid syndrome ([place]) 02/22/2016  . Tobacco abuse 02/22/2016  . Family history of breast cancer 02/22/2016  . Lumbago 02/22/2016  . Sleep disorder 02/22/2016  . Depression, major, recurrent, in partial remission ([place]) 02/22/2016    Past Surgical History:  Procedure Laterality Date  . ABDOMINAL HYSTERECTOMY  2011   partial - bleeding  . EYE SURGERY     lazy eye     OB History   None      Home Medications    Prior to Admission medications   Medication Sig Start Date End Date Taking? Authorizing Provider  acetaminophen (TYLENOL) 500 MG tablet Take 1,000 mg by mouth every 6 (six) hours as needed.    [provider]  DULoxetine (CYMBALTA) 60 MG capsule Take 60 mg by mouth daily. 09/14/17   [provider]  HYDROcodone-acetaminophen (NORCO/VICODIN) 5-325 MG tablet Take 1 tablet by mouth every 6 (six) hours as needed for moderate pain. 01/28/18   Milton Ferguson, MD  ibuprofen (ADVIL,MOTRIN) 200 MG tablet Take 600 mg by mouth every 6 (six) hours as needed.    [provider]  Melatonin 2.5 MG CHEW Chew 5 mg by mouth at bedtime as needed (for rest).     [provider]  meloxicam (MOBIC) 15 MG tablet Take 1 tablet (15 mg total) by mouth daily. TAKE WITH FOOD 09/17/17   Raylene Everts, MD  metoCLOPramide (REGLAN) 10 MG tablet Take 1 tablet (10 mg total) by mouth every 6 (six) hours as needed for nausea (nausea/headache). 01/27/18   Julianne Rice, MD    Family History Family History  Problem Relation Age of Onset  . Cancer Mother        breast   . Arthritis Mother   . Hypertension Mother   . Thyroid disease Father   . Mental illness Sister        depression ? bipolar  . Dementia Maternal Grandmother   . Cancer Maternal Grandfather   . Heart disease Paternal Grandmother   . Cancer Paternal Grandfather     Social History Social History   Tobacco Use  . Smoking status: Current Every Day Smoker    Packs/day: 1.00  Years: 20.00    Pack years: 20.00    Types: Cigarettes  . Smokeless tobacco: Never Used  Substance Use Topics  . Alcohol use: No  . Drug use: No     Allergies   Patient has no known allergies.   Review of Systems Review of Systems  Constitutional: Negative for appetite change and fatigue.  HENT: Negative for congestion, ear discharge and sinus pressure.   Eyes: Negative for discharge.  Respiratory: Negative for cough.   Cardiovascular: Negative for chest pain.  Gastrointestinal: Negative for abdominal pain, anorexia and diarrhea.  Genitourinary: Negative for frequency and hematuria.  Musculoskeletal: Negative for back pain.  Skin: Negative for rash.  Neurological: Positive for headaches. Negative for seizures.    Psychiatric/Behavioral: Negative for hallucinations.     Physical Exam Updated Vital Signs BP (!) 145/97 (BP Location: Right Arm)   Pulse 76   Temp 97.8 F (36.6 C) (Oral)   Resp 18   Ht 5\' 7"  (1.702 m)   Wt 108.9 kg (240 lb)   SpO2 99%   BMI 37.59 kg/m   Physical Exam  Constitutional: She is oriented to person, place, and time. She appears well-developed.  HENT:  Head: Normocephalic.  Eyes: Conjunctivae and EOM are normal. No scleral icterus.  Neck: Neck supple. No thyromegaly present.  Cardiovascular: Normal rate and regular rhythm. Exam reveals no gallop and no friction rub.  No murmur heard. Pulmonary/Chest: No stridor. She has no wheezes. She has no rales. She exhibits no tenderness.  Abdominal: She exhibits no distension. There is no tenderness. There is no rebound.  Musculoskeletal: Normal range of motion. She exhibits no edema.  Lymphadenopathy:    She has no cervical adenopathy.  Neurological: She is oriented to person, place, and time. She exhibits normal muscle tone. Coordination normal.  Skin: No rash noted. No erythema.  Psychiatric: She has a normal mood and affect. Her behavior is normal.     ED Treatments / Results  Labs (all labs ordered are listed, but only abnormal results are displayed) Labs Reviewed - No data to display  EKG None  Radiology Ct Head Wo Contrast  Result Date: 01/27/2018 CLINICAL DATA:  Progressively worsening headache since yesterday. Blurry vision, nausea and vomiting. Extremity numbness. History of anti phospholipid syndrome. EXAM: CT HEAD WITHOUT CONTRAST TECHNIQUE: Contiguous axial images were obtained from the base of the skull through the vertex without intravenous contrast. COMPARISON:  CT HEAD March 31, 2013 FINDINGS: BRAIN: No intraparenchymal hemorrhage, mass effect nor midline shift. The ventricles and sulci are normal. No acute large vascular territory infarcts. RIGHT inferior basal ganglia perivascular space. No  abnormal extra-axial fluid collections. Basal cisterns are patent. VASCULAR: Mildly dolichoectatic intracranial vessels. SKULL/SOFT TISSUES: No skull fracture. No significant soft tissue swelling. ORBITS/SINUSES: The included ocular globes and orbital contents are normal.Trace paranasal sinus mucosal thickening. Mastoid air cells are well aerated. OTHER: None. IMPRESSION: 1. No acute intracranial process. 2. Dolichoectatic intracranial vessels associated with chronic hypertension. 3. Otherwise negative noncontrast CT HEAD. Electronically Signed   By: Elon Alas M.D.   On: 01/27/2018 17:26    Procedures Procedures (including critical care time)  Medications Ordered in ED Medications  sodium chloride 0.9 % bolus 1,000 mL (1,000 mLs Intravenous New Bag/Given 01/28/18 1427)  ketorolac (TORADOL) 30 MG/ML injection 30 mg (30 mg Intravenous Given 01/28/18 1428)  metoCLOPramide (REGLAN) injection 10 mg (10 mg Intravenous Given 01/28/18 1428)  diphenhydrAMINE (BENADRYL) injection 25 mg (25 mg Intravenous Given 01/28/18 1428)  Initial Impression / Assessment and Plan / ED Course  I have reviewed the triage vital signs and the nursing notes.  Pertinent labs & imaging results that were available during my care of the patient were reviewed by me and considered in my medical decision making (see chart for details).    Patient with recurrent headache.  Normal CT scan of the head yesterday.  Patient given migraine cocktail here and improved.  She is discharged home and will follow-up with her PCP Final Clinical Impressions(s) / ED Diagnoses   Final diagnoses:  Bad headache    ED Discharge Orders        Ordered    HYDROcodone-acetaminophen (NORCO/VICODIN) 5-325 MG tablet  Every 6 hours PRN     01/28/18 1539       Milton Ferguson, MD 01/28/18 1548

## 2018-02-27 ENCOUNTER — Ambulatory Visit: Payer: Self-pay | Admitting: Family Medicine

## 2018-03-04 ENCOUNTER — Other Ambulatory Visit: Payer: Self-pay

## 2018-03-04 ENCOUNTER — Emergency Department (HOSPITAL_COMMUNITY)
Admission: EM | Admit: 2018-03-04 | Discharge: 2018-03-05 | Disposition: A | Discharge status: Home / Self Care | Payer: BC Managed Care – PPO | Attending: Emergency Medicine | Admitting: Emergency Medicine

## 2018-03-04 ENCOUNTER — Encounter (HOSPITAL_COMMUNITY): Payer: Self-pay

## 2018-03-04 DIAGNOSIS — Z79899 Other long term (current) drug therapy: Secondary | ICD-10-CM | POA: Diagnosis not present

## 2018-03-04 DIAGNOSIS — R519 Headache, unspecified: Secondary | ICD-10-CM

## 2018-03-04 DIAGNOSIS — F17210 Nicotine dependence, cigarettes, uncomplicated: Secondary | ICD-10-CM | POA: Diagnosis not present

## 2018-03-04 DIAGNOSIS — R51 Headache: Secondary | ICD-10-CM | POA: Diagnosis present

## 2018-03-04 MED ORDER — SODIUM CHLORIDE 0.9 % IV BOLUS (SEPSIS)
500.0000 mL | Freq: Once | INTRAVENOUS | Status: AC
  Administered 2018-03-04: 500 mL via INTRAVENOUS

## 2018-03-04 MED ORDER — DIPHENHYDRAMINE HCL 50 MG/ML IJ SOLN
25.0000 mg | Freq: Once | INTRAMUSCULAR | Status: AC
  Administered 2018-03-04: 25 mg via INTRAVENOUS
  Filled 2018-03-04: qty 1

## 2018-03-04 MED ORDER — KETOROLAC TROMETHAMINE 30 MG/ML IJ SOLN
30.0000 mg | Freq: Once | INTRAMUSCULAR | Status: AC
  Administered 2018-03-04: 30 mg via INTRAVENOUS
  Filled 2018-03-04: qty 1

## 2018-03-04 MED ORDER — PROCHLORPERAZINE EDISYLATE 10 MG/2ML IJ SOLN
5.0000 mg | Freq: Once | INTRAMUSCULAR | Status: AC
  Administered 2018-03-04: 5 mg via INTRAVENOUS
  Filled 2018-03-04: qty 2

## 2018-03-04 NOTE — ED Triage Notes (Signed)
Pt presents to ED with migraine. Pt states it started Friday. Pt states she is having nausea and vomiting as well as light sensitivity with the migraine started today. Pt with history of migraines but does not take any medications. Pt states she has took Hydrocodone, Reglan and Benadryl tonight.

## 2018-03-04 NOTE — ED Provider Notes (Signed)
[facility] EMERGENCY DEPARTMENT Provider Note   CSN: [id number] Arrival date & time: 03/04/18  2151     History   Chief Complaint Chief Complaint  Patient presents with  . Migraine    HPI Diana Jensen is a 42 y.o. female.  Patient is a 42 year old female who presents to the emergency department with migraine headache.  Patient states she has a history of migraines.  She is not exactly sure what causes her migraines or what her trigger is.  She states that this problem started on September 6.  The patient states that she has been having problems with nausea and vomiting.  She has been light sensitive.  She has tried conservative measures at home including Reglan and hydrocodone, but these have not been successful in resolving her headache.  She is actually feels that the headache is getting worse.  She presents now for assistance with this issue.  The history is provided by the patient.  Migraine  This is a recurrent problem. Associated symptoms include headaches. Pertinent negatives include no chest pain, no abdominal pain and no shortness of breath.    Past Medical History:  Diagnosis Date  . Allergy    seasonal  . Anxiety   . Arthritis    deterioration of spine, knees  . Clotting disorder (HCC)    antiphospholipid syndrome  . Depression     Patient Active Problem List   Diagnosis Date Noted  . 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 Antiphospholipid syndrome ([place]) 02/22/2016  . Tobacco abuse 02/22/2016  . Family history of breast cancer 02/22/2016  . Lumbago 02/22/2016  . Sleep disorder 02/22/2016  . Depression, major, recurrent, in partial remission ([place]) 02/22/2016    Past Surgical History:  Procedure Laterality Date  . ABDOMINAL HYSTERECTOMY  2011   partial - bleeding  . EYE SURGERY     lazy eye     OB History   None      Home Medications    Prior to Admission medications   Medication Sig Start Date End Date Taking? Authorizing Provider    acetaminophen (TYLENOL) 500 MG tablet Take 1,000 mg by mouth every 6 (six) hours as needed.    [provider]  DULoxetine (CYMBALTA) 60 MG capsule Take 60 mg by mouth daily. 09/14/17   [provider]  HYDROcodone-acetaminophen (NORCO/VICODIN) 5-325 MG tablet Take 1 tablet by mouth every 6 (six) hours as needed for moderate pain. 01/28/18   Milton Ferguson, MD  ibuprofen (ADVIL,MOTRIN) 200 MG tablet Take 600 mg by mouth every 6 (six) hours as needed.    [provider]  Melatonin 2.5 MG CHEW Chew 5 mg by mouth at bedtime as needed (for rest).     [provider]  meloxicam (MOBIC) 15 MG tablet Take 1 tablet (15 mg total) by mouth daily. TAKE WITH FOOD 09/17/17   Raylene Everts, MD  metoCLOPramide (REGLAN) 10 MG tablet Take 1 tablet (10 mg total) by mouth every 6 (six) hours as needed for nausea (nausea/headache). 01/27/18   Julianne Rice, MD    Family History Family History  Problem Relation Age of Onset  . Cancer Mother        breast   . Arthritis Mother   . Hypertension Mother   . Thyroid disease Father   . Mental illness Sister        depression ? bipolar  . Dementia Maternal Grandmother   . Cancer Maternal Grandfather   . Heart disease  Paternal Grandmother   . Cancer Paternal Grandfather     Social History Social History   Tobacco Use  . Smoking status: Current Every Day Smoker    Packs/day: 1.00    Years: 20.00    Pack years: 20.00    Types: Cigarettes  . Smokeless tobacco: Never Used  Substance Use Topics  . Alcohol use: No  . Drug use: No     Allergies   Patient has no known allergies.   Review of Systems Review of Systems  Constitutional: Negative for activity change.       All ROS Neg except as noted in HPI  HENT: Negative for nosebleeds.   Eyes: Positive for photophobia. Negative for discharge.  Respiratory: Negative for cough, shortness of breath and wheezing.   Cardiovascular: Negative for chest pain and  palpitations.  Gastrointestinal: Negative for abdominal pain and blood in stool.  Genitourinary: Negative for dysuria, frequency and hematuria.  Musculoskeletal: Negative for arthralgias, back pain and neck pain.  Skin: Negative.   Neurological: Positive for headaches. Negative for dizziness, seizures, syncope, speech difficulty, weakness and numbness.  Psychiatric/Behavioral: Negative for confusion and hallucinations.     Physical Exam Updated Vital Signs BP (!) 143/95   Pulse 70   Temp 98 F (36.7 C) (Tympanic)   Resp 20   Ht 5\' 7"  (1.702 m)   Wt 108.9 kg   SpO2 95%   BMI 37.59 kg/m   Physical Exam  Constitutional: She appears well-developed and well-nourished. No distress.  HENT:  Head: Normocephalic and atraumatic.  Right Ear: External ear normal.  Left Ear: External ear normal.  Eyes: Conjunctivae are normal. Right eye exhibits no discharge. Left eye exhibits no discharge. No scleral icterus.  Neck: Neck supple. No tracheal deviation present.  Cardiovascular: Normal rate, regular rhythm and intact distal pulses.  Pulmonary/Chest: Effort normal and breath sounds normal. No stridor. No respiratory distress. She has no wheezes. She has no rales.  Abdominal: Soft. Bowel sounds are normal. She exhibits no distension. There is no tenderness. There is no rebound and no guarding.  Musculoskeletal: She exhibits no edema or tenderness.  Neurological: She is alert. She has normal strength. No cranial nerve deficit (no facial droop, extraocular movements intact, no slurred speech) or sensory deficit. She exhibits normal muscle tone. She displays no seizure activity. Coordination normal.  Skin: Skin is warm and dry. No rash noted.  Psychiatric: She has a normal mood and affect.  Nursing note and vitals reviewed.    ED Treatments / Results  Labs (all labs ordered are listed, but only abnormal results are displayed) Labs Reviewed - No data to display  EKG None  Radiology No  results found.  Procedures Procedures (including critical care time)  Medications Ordered in ED Medications - No data to display   Initial Impression / Assessment and Plan / ED Course  I have reviewed the triage vital signs and the nursing notes.  Pertinent labs & imaging results that were available during my care of the patient were reviewed by me and considered in my medical decision making (see chart for details).       Final Clinical Impressions(s) / ED Diagnoses MDM  Vital signs reviewed.  Pulse oximetry is 97 and 98% on room air.  No gross neurologic deficits appreciated on examination.  Patient is awake and alert and oriented to person place time and situation.  Patient treated in the emergency department with IV fluids, IV Compazine, Toradol, and Benadryl.  After  receiving the fluids and medications and a short time of observation, the patient states that her headache has resolved.  She feels tremendously better.  Patient to be discharged home.  Patient will be given promethazine to use for the nausea.  I have asked the patient to see Dr. Renato Battles for assistance with her breakthrough headache pain.   Final diagnoses:  Bad headache    ED Discharge Orders         Ordered    promethazine (PHENERGAN) 12.5 MG tablet  Every 6 hours PRN     03/05/18 0113           Lily Kocher, PA-C 03/06/18 1121    Rolland Porter, MD 03/09/18 2259

## 2018-03-05 MED ORDER — PROMETHAZINE HCL 12.5 MG PO TABS: 12.5000 mg | ORAL_TABLET | Freq: Four times a day (QID) | ORAL | 0 refills | Status: DC | PRN

## 2018-03-05 NOTE — Discharge Instructions (Signed)
Your vital signs were essentially within normal limits tonight.  Your oxygen level is 97% on room air.  Within normal limits also.  No gross neurologic deficits appreciated on your examination tonight.  Please continue your current breakthrough management for your headaches.  Please use Phenergan for nausea.  Phenergan may cause drowsiness.  Please do not drive a vehicle, operate machinery, drink alcohol, or participate in activities that require concentration when taking Phenergan.  Please discuss your breakthrough headaches with Dr. Renato Battles.

## 2018-05-24 ENCOUNTER — Ambulatory Visit (HOSPITAL_COMMUNITY)
Admission: RE | Admit: 2018-05-24 | Discharge: 2018-05-24 | Disposition: A | Discharge status: Home / Self Care | Payer: BC Managed Care – PPO | Source: Ambulatory Visit | Attending: Nurse Practitioner | Admitting: Nurse Practitioner

## 2018-05-24 ENCOUNTER — Other Ambulatory Visit (HOSPITAL_COMMUNITY): Payer: Self-pay | Admitting: Nurse Practitioner

## 2018-05-24 DIAGNOSIS — M542 Cervicalgia: Secondary | ICD-10-CM | POA: Diagnosis not present

## 2018-06-13 ENCOUNTER — Telehealth: Payer: Self-pay

## 2018-06-13 NOTE — Telephone Encounter (Signed)
Pt has question about prev visit.  [phone] Pt had hyst; wants to know if she still had a cervix.  Adv she had a supracervical hyst so yes she does have a cx.

## 2018-06-17 ENCOUNTER — Other Ambulatory Visit (HOSPITAL_COMMUNITY): Payer: Self-pay | Admitting: Nurse Practitioner

## 2018-06-17 DIAGNOSIS — Z1231 Encounter for screening mammogram for malignant neoplasm of breast: Secondary | ICD-10-CM

## 2018-07-01 ENCOUNTER — Ambulatory Visit (HOSPITAL_COMMUNITY)
Admission: RE | Admit: 2018-07-01 | Discharge: 2018-07-01 | Disposition: A | Discharge status: Home / Self Care | Payer: BC Managed Care – PPO | Source: Ambulatory Visit | Attending: Nurse Practitioner | Admitting: Nurse Practitioner

## 2018-07-01 DIAGNOSIS — Z1231 Encounter for screening mammogram for malignant neoplasm of breast: Secondary | ICD-10-CM | POA: Diagnosis present

## 2018-07-03 ENCOUNTER — Encounter: Payer: Self-pay | Admitting: Obstetrics and Gynecology

## 2018-07-30 ENCOUNTER — Other Ambulatory Visit (HOSPITAL_BASED_OUTPATIENT_CLINIC_OR_DEPARTMENT_OTHER): Payer: Self-pay

## 2018-07-30 DIAGNOSIS — G4733 Obstructive sleep apnea (adult) (pediatric): Secondary | ICD-10-CM

## 2018-08-01 ENCOUNTER — Other Ambulatory Visit (HOSPITAL_COMMUNITY): Payer: Self-pay | Admitting: Orthopaedic Surgery

## 2018-08-01 ENCOUNTER — Other Ambulatory Visit: Payer: Self-pay | Admitting: Orthopaedic Surgery

## 2018-08-01 DIAGNOSIS — M542 Cervicalgia: Secondary | ICD-10-CM

## 2018-08-09 ENCOUNTER — Ambulatory Visit (HOSPITAL_COMMUNITY): Payer: BC Managed Care – PPO

## 2018-08-20 ENCOUNTER — Ambulatory Visit (HOSPITAL_COMMUNITY)
Admission: RE | Admit: 2018-08-20 | Discharge: 2018-08-20 | Disposition: A | Discharge status: Home / Self Care | Payer: BC Managed Care – PPO | Source: Ambulatory Visit | Attending: Orthopaedic Surgery | Admitting: Orthopaedic Surgery

## 2018-08-20 DIAGNOSIS — M542 Cervicalgia: Secondary | ICD-10-CM | POA: Diagnosis not present

## 2018-09-13 ENCOUNTER — Encounter (INDEPENDENT_AMBULATORY_CARE_PROVIDER_SITE_OTHER): Payer: Self-pay | Admitting: Internal Medicine

## 2018-12-17 ENCOUNTER — Ambulatory Visit (INDEPENDENT_AMBULATORY_CARE_PROVIDER_SITE_OTHER): Payer: Self-pay | Admitting: Internal Medicine

## 2018-12-25 ENCOUNTER — Encounter: Payer: Self-pay | Admitting: Orthopedic Surgery

## 2018-12-25 ENCOUNTER — Ambulatory Visit: Payer: BC Managed Care – PPO | Admitting: Orthopedic Surgery

## 2018-12-25 ENCOUNTER — Other Ambulatory Visit: Payer: Self-pay

## 2018-12-25 VITALS — BP 143/94 | HR 81 | Temp 97.5°F | Ht 67.0 in | Wt 243.0 lb

## 2018-12-25 DIAGNOSIS — G5601 Carpal tunnel syndrome, right upper limb: Secondary | ICD-10-CM

## 2018-12-25 NOTE — Patient Instructions (Addendum)
SURGERY ON July 16  OOW 4 WEEKS   Open Carpal Tunnel Release, Care After This sheet gives you information about how to care for yourself after your procedure. Your health care provider may also give you more specific instructions. If you have problems or questions, contact your health care provider. What can I expect after the procedure? After the procedure, it is common to have:  Wrist stiffness.  Bruising. Follow these instructions at home: Bathing  Do not take baths, swim, or use a hot tub until your health care provider approves. Ask your health care provider if you may take showers.  Keep your bandage (dressing) dry until your health care provider says it can be removed. If you have a splint or brace:  Wear the splint or brace as told by your health care provider. You may need to wear it for 2-3 weeks. Remove it only as told by your health care provider.  Loosen the splint or brace if your fingers tingle, become numb, or turn cold and blue.  Keep the splint or brace clean.  If the splint or brace is not waterproof: ? Do not let it get wet. ? Cover it with a watertight covering when you take a bath or a shower. Incision care   Follow instructions from your health care provider about how to take care of your incision. Make sure you: ? Wash your hands with soap and water before you change your dressing. If soap and water are not available, use hand sanitizer. ? Change your dressing as told by your health care provider. ? Leave stitches (sutures), skin glue, or adhesive strips in place. These skin closures may need to stay in place for 2 weeks or longer. If adhesive strip edges start to loosen and curl up, you may trim the loose edges. Do not remove adhesive strips completely unless your health care provider tells you to do that.  Check your incision area every day for signs of infection. Check for: ? Redness, swelling, or pain. ? Fluid or blood. ? Warmth. ? Pus or a bad  smell. Managing pain, stiffness, and swelling   If directed, put ice on the affected area. ? If you have a removable splint or brace, remove it as told by your health care provider. ? Put ice in a plastic bag. ? Place a towel between your skin and the bag. ? Leave the ice on for 20 minutes, 2-3 times a day.  Move your fingers often to avoid stiffness and to lessen swelling.  Raise (elevate) your wrist above the level of your heart while you are sitting or lying down. Activity  Do not drive until your health care provider approves.  Do not drive or use heavy machinery while taking prescription pain medicine.  Return to your normal activities as told by your health care provider. Avoid activities that cause pain.  If physical therapy was prescribed, do exercises as told by your therapist. Physical therapy can help you heal faster and regain movement. General instructions  Take over-the-counter and prescription medicines only as told by your health care provider.  If you are taking prescription pain medicine, take actions to prevent or treat constipation. Your health care provider may recommend that you: ? Drink enough fluid to keep your urine pale yellow. ? Eat foods that are high in fiber, such as fresh fruits and vegetables, whole grains, and beans. ? Limit foods that are high in fat and processed sugars, such as fried or sweet foods. ?  Take an over-the-counter or prescription medicine for constipation.  Do not use any products that contain nicotine or tobacco, such as cigarettes and e-cigarettes. If you need help quitting, ask your health care provider.  Keep all follow-up visits as told by your health care provider and physical therapist. This is important. Contact a health care provider if:  You have redness or swelling around your incision.  You have fluid or blood coming from your incision.  Your incision feels warm to the touch.  You have pus or a bad smell coming  from your incision.  You have a fever.  You have chills.  You have pain that does not get better with medicine.  Your carpal tunnel symptoms do not go away after 2 months.  Your carpal tunnel symptoms go away and then come back. Get help right away if:  You have pain or numbness that is getting worse.  Your fingers or fingertips become very pale or bluish in color.  You are not able to move your fingers. Summary  It is common to have wrist stiffness and bruising after a carpal tunnel release.  Icing and raising (elevating) your wrist may help to lessen swelling and pain.  Call your health care provider if you have a fever or notice any signs of infection in your incision area. This information is not intended to replace advice given to you by your health care provider. Make sure you discuss any questions you have with your health care provider. Document Released: 12/30/2004 Document Revised: 05/25/2017 Document Reviewed: 02/19/2017 Elsevier Patient Education  2020 [employer].

## 2018-12-25 NOTE — Progress Notes (Addendum)
Diana Jensen   12/25/2018  HISTORY SECTION :  Chief Complaint  Patient presents with  . 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  Work-up included MRI cervical spine which showed degenerative disc disease  She is interested in having surgery  Risk factors are smoking, no diabetes or thyroid disease no rheumatoid arthritis or kidney disease   Review of Systems  Eyes: Positive for photophobia.  Musculoskeletal: Positive for back pain, joint pain and neck pain.  Neurological: Positive for tingling, sensory change, focal weakness and headaches.  Psychiatric/Behavioral: Positive for depression.     Past Medical History:  Diagnosis Date  . Allergy    seasonal  . Anxiety   . Arthritis    deterioration of spine, knees  . Clotting disorder (HCC)    antiphospholipid syndrome  . Depression     Past Surgical History:  Procedure Laterality Date  . ABDOMINAL HYSTERECTOMY  2011   partial - bleeding  . EYE SURGERY     lazy eye     No Known Allergies   Current Outpatient Medications:  .  acetaminophen (TYLENOL) 500 MG tablet, Take 1,000 mg by mouth every 6 (six) hours as needed., Disp: , Rfl:  .  cholecalciferol (VITAMIN D3) 25 MCG (1000 UT) tablet, Take 1,000 Units by mouth daily., Disp: , Rfl:  .  DULoxetine (CYMBALTA) 60 MG capsule, Take 60 mg by mouth daily., Disp: , Rfl:  .  gabapentin (NEURONTIN) 600 MG tablet, Take 600 mg by mouth daily., Disp: , Rfl:  .  ibuprofen (ADVIL,MOTRIN) 200 MG tablet, Take 600 mg by mouth every 6 (six) hours as needed., Disp: , Rfl:  .  meloxicam (MOBIC) 15 MG tablet, Take 1 tablet (15 mg total)  by mouth daily. TAKE WITH FOOD, Disp: 90 tablet, Rfl: 0 .  Turmeric (QC TUMERIC COMPLEX PO), Take by mouth., Disp: , Rfl:    PHYSICAL EXAM SECTION: 1) BP (!) 143/94   Pulse 81   Ht 5\' 7"  (1.702 m)   Wt 243 lb (110.2 kg)   BMI 38.06 kg/m   Body mass index is 38.06 kg/m. General appearance: Well-developed well-nourished no gross deformities  2) Cardiovascular normal pulse and perfusion in both upper extremities with normal color and no edema  Normal capillary refill  3) Neurologically deep tendon reflexes are equal and normal, sensory deficit noted in the right hand median nerve distribution with specific delineation between the ulnar and radial border of the ring finger  4) Psychological: Awake alert and oriented x3 mood and affect normal  5) Skin no lacerations or ulcerations no nodularity no palpable masses, no erythema or nodularity  6) Musculoskeletal: Left upper extremity normal range of motion wrist is stable motor exam is normal  Right upper extremity tenderness over the carpal tunnel nontender in the forearm Normal range of motion in the wrist no instability of the wrist No appreciable grip strength loss no atrophy  Carpal tunnel findings include positive carpal tunnel compression test positive Phalen's negative Tinel's  She has what may be a ganglion cyst in the subcu area just proximal to the thenar eminence Reports no change in size   MEDICAL DECISION SECTION:  No diagnosis found.  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 Alternative treatment has also been reviewed, questions were encouraged and answered. The postoperative plan is also been reviewed.  Specific to this procedure skin sensitivity for 6 months possible.  Made her aware that the skin may open up some after the stitches were taken out.   9:21 AM Arther Abbott, MD 12/25/2018

## 2019-01-07 NOTE — Patient Instructions (Addendum)
Diana Jensen  01/07/2019     @PREFPERIOPPHARMACY @   Your procedure is scheduled on 01/09/2019.  Report to [facility] at 6:15 A.M.  Call this number if you have problems the morning of surgery:  [phone]   Remember:  Do not eat or drink after midnight.    Take these medicines the morning of surgery with A SIP OF WATER :Cymbalta, Neurontin    Do not wear jewelry, make-up or nail polish.  Do not wear lotions, powders, or perfumes, or deodorant.  Do not shave 48 hours prior to surgery.  Men may shave face and neck.  Do not bring valuables to the hospital.  [facility] is not responsible for any belongings or valuables.  Contacts, dentures or bridgework may not be worn into surgery.  Leave your suitcase in the car.  After surgery it may be brought to your room.  For patients admitted to the hospital, discharge time will be determined by your treatment team.  Patients discharged the day of surgery will not be allowed to drive home.   Name and phone number of your driver:   family Special instructions:  n/a  Please read over the following fact sheets that you were given. Care and Recovery After Surgery  Open Carpal Tunnel Release  Open carpal tunnel release is a surgery to relieve symptoms caused by carpal tunnel syndrome. The carpal tunnel is a narrow, hollow space in the wrist. It is located between the wrist bones and a band of connective tissue (transverse carpal ligament, also known as the flexor retinaculum). The nerve that supplies most of the hand (median nerve) passes through the carpal tunnel, and so do tissues that connect bones to muscles (tendons) in the hand and arm. Carpal tunnel syndrome makes this space swell and become narrow. The swelling pinches the median nerve and causes pain and numbness. During carpal tunnel release surgery, the transverse carpal ligament is cut to make more room in the carpal tunnel space. This also lessens the pressure on the median  nerve. You may have this surgery if other types of treatment have not relieved your carpal tunnel symptoms. This surgery is usually done only for the hand that you use more often (dominant hand), but it may be done for both hands depending on your symptoms. Tell a health care provider about:  Any allergies you have.  All medicines you are taking, including vitamins, herbs, eye drops, creams, and over-the-counter medicines.  Any problems you or family members have had with anesthetic medicines.  Any blood disorders you have.  Any surgeries you have had.  Any medical conditions you have.  Whether you are pregnant or may be pregnant. What are the risks? Generally, this is a safe procedure. However, problems may occur, including:  Infection.  Bleeding.  Injury to the median nerve.  Allergic reactions to medicines.  The surgery failing to relieve your symptoms, or making your symptoms worse. What happens before the procedure? Medicines  Ask your health care provider about: ? Changing or stopping your regular medicines. This is especially important if you are taking diabetes medicines or blood thinners. ? Taking medicines such as aspirin and ibuprofen. These medicines can thin your blood. Do not take these medicines unless your health care provider tells you to take them. ? Taking over-the-counter medicines, vitamins, herbs, and supplements.  You may be given antibiotic medicine to help prevent infection. Staying hydrated Follow instructions from your health care provider about hydration, which may include:  Up to 2 hours before the procedure - you may continue to drink clear liquids, such as water, clear fruit juice, black coffee, and plain tea. Eating and drinking restrictions Follow instructions from your health care provider about eating and drinking, which may include:  8 hours before the procedure - stop eating heavy meals or foods such as meat, fried foods, or fatty  foods.  6 hours before the procedure - stop eating light meals or foods, such as toast or cereal.  6 hours before the procedure - stop drinking milk or drinks that contain milk.  2 hours before the procedure - stop drinking clear liquids. General instructions  Ask your health care provider how your surgical site will be marked or identified.  You may be asked to shower with a germ-killing soap.  Plan to have someone take you home from the hospital or clinic.  Plan to have a responsible adult care for you for at least 24 hours after you leave the hospital or clinic. This is important. What happens during the procedure?  To lower your risk of infection: ? Your health care team will wash or sanitize their hands. ? Hair may be removed from the surgical area. ? Your arm, hand, and wrist will be cleaned with a germ-killing (antiseptic) solution.  An IV will be inserted into one of your veins.  You will be given one of the following: ? A medicine to numb the wrist area (local anesthetic). You may also be given a medicine to help you relax (sedative). ? A medicine to make you fall asleep (general anesthetic).  An incision will be made in your wrist, on the same side as your palm.  The skin of your wrist will be spread to expose the transverse carpal ligament.  The transverse carpal ligament will be cut to make more room in the carpal tunnel space.  Your incision will be closed with stitches (sutures) or staples.  A bandage (dressing) will be placed over your wrist and wrapped around your hand and wrist. The procedure may vary among health care providers and hospitals. What happens after the procedure?  Your blood pressure, heart rate, breathing rate, and blood oxygen level will be monitored until the medicines you were given have worn off.  You will be given pain medicine as needed.  A splint or brace may be placed over your dressing, to hold your hand and wrist in place while  you heal.  Do not drive until your health care provider approves. Summary  Carpal tunnel release is a surgery to relieve pain and numbness in the hand caused by swelling around a nerve (carpal tunnel syndrome).  You may have this surgery if other types of treatment have not relieved your carpal tunnel symptoms.  During carpal tunnel release surgery, a band of connective tissue (transverse carpal ligament) is cut to make more room in the carpal tunnel space. This information is not intended to replace advice given to you by your health care provider. Make sure you discuss any questions you have with your health care provider. Document Released: 09/02/2003 Document Revised: 05/25/2017 Document Reviewed: 02/19/2017 Elsevier Patient Education  2020 [employer] POST-ANESTHESIA  IMMEDIATELY FOLLOWING SURGERY:  Do not drive or operate machinery for the first twenty four hours after surgery.  Do not make any important decisions for twenty four hours after surgery or while taking narcotic pain medications or sedatives.  If you develop intractable nausea and  vomiting or a severe headache please notify your doctor immediately.  FOLLOW-UP:  Please make an appointment with your surgeon as instructed. You do not need to follow up with anesthesia unless specifically instructed to do so.  WOUND CARE INSTRUCTIONS (if applicable):  Keep a dry clean dressing on the anesthesia/puncture wound site if there is drainage.  Once the wound has quit draining you may leave it open to air.  Generally you should leave the bandage intact for twenty four hours unless there is drainage.  If the epidural site drains for more than 36-48 hours please call the anesthesia department.  QUESTIONS?:  Please feel free to call your physician or the hospital operator if you have any questions, and they will be happy to assist you.

## 2019-01-08 ENCOUNTER — Encounter (HOSPITAL_COMMUNITY)
Admission: RE | Admit: 2019-01-08 | Discharge: 2019-01-08 | Disposition: A | Discharge status: Home / Self Care | Payer: BC Managed Care – PPO | Source: Ambulatory Visit | Attending: Orthopedic Surgery | Admitting: Orthopedic Surgery

## 2019-01-08 ENCOUNTER — Other Ambulatory Visit: Payer: Self-pay

## 2019-01-08 ENCOUNTER — Encounter (HOSPITAL_COMMUNITY): Payer: Self-pay

## 2019-01-08 ENCOUNTER — Other Ambulatory Visit (HOSPITAL_COMMUNITY)
Admission: RE | Admit: 2019-01-08 | Discharge: 2019-01-08 | Disposition: A | Discharge status: Home / Self Care | Payer: BC Managed Care – PPO | Source: Ambulatory Visit | Attending: Orthopedic Surgery | Admitting: Orthopedic Surgery

## 2019-01-08 DIAGNOSIS — Z1159 Encounter for screening for other viral diseases: Secondary | ICD-10-CM | POA: Insufficient documentation

## 2019-01-08 DIAGNOSIS — Z20822 Contact with and (suspected) exposure to covid-19: Secondary | ICD-10-CM

## 2019-01-08 DIAGNOSIS — Z01812 Encounter for preprocedural laboratory examination: Secondary | ICD-10-CM | POA: Insufficient documentation

## 2019-01-08 LAB — BASIC METABOLIC PANEL
Anion gap: 13 (ref 5–15)
BUN: 16 mg/dL (ref 6–20)
CO2: 22 mmol/L (ref 22–32)
Calcium: 9.4 mg/dL (ref 8.9–10.3)
Chloride: 106 mmol/L (ref 98–111)
Creatinine, Ser: 0.68 mg/dL (ref 0.44–1.00)
GFR calc Af Amer: >60 mL/min (ref >60)
GFR calc non Af Amer: >60 mL/min (ref >60)
Glucose, Bld: 132 mg/dL — ABNORMAL HIGH (ref 70–99)
Potassium: 3.9 mmol/L (ref 3.5–5.1)
Sodium: 141 mmol/L (ref 135–145)

## 2019-01-08 LAB — CBC WITH DIFFERENTIAL/PLATELET
Abs Immature Granulocytes: 0.02 10*3/uL (ref 0.00–0.07)
Basophils Absolute: 0.1 10*3/uL (ref 0.0–0.1)
Basophils Relative: 1 %
Eosinophils Absolute: 0.5 10*3/uL (ref 0.0–0.5)
Eosinophils Relative: 8 %
HCT: 46 % (ref 36.0–46.0)
Hemoglobin: 15 g/dL (ref 12.0–15.0)
Immature Granulocytes: 0 %
Lymphocytes Relative: 30 %
Lymphs Abs: 1.9 10*3/uL (ref 0.7–4.0)
MCH: 28.2 pg (ref 26.0–34.0)
MCHC: 32.6 g/dL (ref 30.0–36.0)
MCV: 86.5 fL (ref 80.0–100.0)
Monocytes Absolute: 0.3 10*3/uL (ref 0.1–1.0)
Monocytes Relative: 5 %
Neutro Abs: 3.5 10*3/uL (ref 1.7–7.7)
Neutrophils Relative %: 56 %
Platelets: 288 10*3/uL (ref 150–400)
RBC: 5.32 MIL/uL — ABNORMAL HIGH (ref 3.87–5.11)
RDW: 13 % (ref 11.5–15.5)
WBC: 6.3 10*3/uL (ref 4.0–10.5)
nRBC: 0 % (ref 0.0–0.2)

## 2019-01-08 LAB — SARS CORONAVIRUS 2 (TAT 6-24 HRS): SARS Coronavirus 2: NEGATIVE

## 2019-01-08 NOTE — H&P (Signed)
Diana Jensen    12/25/2018   HISTORY SECTION :       Chief Complaint  Patient presents with  . 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   Work-up included MRI cervical spine which showed degenerative disc disease   She is interested in having surgery   Risk factors are smoking, no diabetes or thyroid disease no rheumatoid arthritis or kidney disease     Review of Systems  Eyes: Positive for photophobia.  Musculoskeletal: Positive for back pain, joint pain and neck pain.  Neurological: Positive for tingling, sensory change, focal weakness and headaches.  Psychiatric/Behavioral: Positive for depression.        Past Medical History:  Diagnosis Date  . Allergy      seasonal  . Anxiety    . Arthritis      deterioration of spine, knees  . Clotting disorder (HCC)      antiphospholipid syndrome  . Depression            Past Surgical History:  Procedure Laterality Date  . ABDOMINAL HYSTERECTOMY   2011    partial - bleeding  . EYE SURGERY        lazy eye       No Known Allergies    Current Outpatient Medications:  .  acetaminophen (TYLENOL) 500 MG tablet, Take 1,000 mg by mouth every 6 (six) hours as needed., Disp: , Rfl:  .  cholecalciferol (VITAMIN D3) 25 MCG (1000 UT) tablet, Take 1,000 Units by mouth daily., Disp: , Rfl:  .  DULoxetine (CYMBALTA) 60 MG capsule, Take 60 mg by mouth daily., Disp: , Rfl:  .  gabapentin (NEURONTIN) 600 MG tablet, Take 600 mg by mouth daily., Disp: , Rfl:  .  ibuprofen (ADVIL,MOTRIN) 200 MG tablet, Take 600 mg by mouth every 6 (six) hours as needed., Disp: , Rfl:  .  meloxicam (MOBIC) 15  MG tablet, Take 1 tablet (15 mg total) by mouth daily. TAKE WITH FOOD, Disp: 90 tablet, Rfl: 0 .  Turmeric (QC TUMERIC COMPLEX PO), Take by mouth., Disp: , Rfl:      PHYSICAL EXAM SECTION: 1) BP (!) 143/94   Pulse 81   Ht 5\' 7"  (1.702 m)   Wt 243 lb (110.2 kg)   BMI 38.06 kg/m   Body mass index is 38.06 kg/m. General appearance: Well-developed well-nourished no gross deformities  2) Cardiovascular normal pulse and perfusion in both upper extremities with normal color and no edema   Normal capillary refill   3) Neurologically deep tendon reflexes are equal and normal, sensory deficit noted in the right hand median nerve distribution with specific delineation between the ulnar and radial border of the ring finger   4) Psychological: Awake alert and oriented x3 mood and affect normal   5) Skin no lacerations or ulcerations no nodularity no palpable masses, no erythema or nodularity   6) Musculoskeletal: Left upper extremity normal range of motion wrist is stable motor exam is normal   Right upper extremity tenderness over the carpal tunnel nontender in the forearm Normal range of motion in the wrist no instability of the wrist No appreciable grip strength  loss no atrophy   Carpal tunnel findings include positive carpal tunnel compression test positive Phalen's negative Tinel's   She has what may be a ganglion cyst in the subcu area just proximal to the thenar eminence Reports no change in size     MEDICAL DECISION SECTION:  No diagnosis found.   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 Alternative treatment has also been reviewed, questions were encouraged and answered. The postoperative plan is also been reviewed.   Specific to this procedure skin sensitivity for 6 months possible.  Made her aware that the skin may open up some after the stitches were taken out.     9:21 AM Arther Abbott, MD 12/25/2018

## 2019-01-09 ENCOUNTER — Encounter (HOSPITAL_COMMUNITY)
Admission: RE | Disposition: A | Discharge status: Home / Self Care | Payer: Self-pay | Source: Home / Self Care | Attending: Orthopedic Surgery

## 2019-01-09 ENCOUNTER — Ambulatory Visit (HOSPITAL_COMMUNITY): Payer: BC Managed Care – PPO | Admitting: Anesthesiology

## 2019-01-09 ENCOUNTER — Other Ambulatory Visit: Payer: Self-pay

## 2019-01-09 ENCOUNTER — Ambulatory Visit (HOSPITAL_COMMUNITY)
Admission: RE | Admit: 2019-01-09 | Discharge: 2019-01-09 | Disposition: A | Discharge status: Home / Self Care | Payer: BC Managed Care – PPO | Attending: Orthopedic Surgery | Admitting: Orthopedic Surgery

## 2019-01-09 ENCOUNTER — Encounter (HOSPITAL_COMMUNITY): Payer: Self-pay | Admitting: Anesthesiology

## 2019-01-09 DIAGNOSIS — Z791 Long term (current) use of non-steroidal anti-inflammatories (NSAID): Secondary | ICD-10-CM | POA: Insufficient documentation

## 2019-01-09 DIAGNOSIS — F419 Anxiety disorder, unspecified: Secondary | ICD-10-CM | POA: Diagnosis not present

## 2019-01-09 DIAGNOSIS — D6861 Antiphospholipid syndrome: Secondary | ICD-10-CM | POA: Insufficient documentation

## 2019-01-09 DIAGNOSIS — G5603 Carpal tunnel syndrome, bilateral upper limbs: Secondary | ICD-10-CM | POA: Diagnosis not present

## 2019-01-09 DIAGNOSIS — M542 Cervicalgia: Secondary | ICD-10-CM | POA: Diagnosis not present

## 2019-01-09 DIAGNOSIS — M549 Dorsalgia, unspecified: Secondary | ICD-10-CM | POA: Diagnosis not present

## 2019-01-09 DIAGNOSIS — Z79899 Other long term (current) drug therapy: Secondary | ICD-10-CM | POA: Diagnosis not present

## 2019-01-09 DIAGNOSIS — F17200 Nicotine dependence, unspecified, uncomplicated: Secondary | ICD-10-CM | POA: Diagnosis not present

## 2019-01-09 DIAGNOSIS — G5601 Carpal tunnel syndrome, right upper limb: Secondary | ICD-10-CM | POA: Diagnosis not present

## 2019-01-09 DIAGNOSIS — Z90710 Acquired absence of both cervix and uterus: Secondary | ICD-10-CM | POA: Insufficient documentation

## 2019-01-09 DIAGNOSIS — H53149 Visual discomfort, unspecified: Secondary | ICD-10-CM | POA: Insufficient documentation

## 2019-01-09 DIAGNOSIS — F329 Major depressive disorder, single episode, unspecified: Secondary | ICD-10-CM | POA: Diagnosis not present

## 2019-01-09 DIAGNOSIS — M1990 Unspecified osteoarthritis, unspecified site: Secondary | ICD-10-CM | POA: Insufficient documentation

## 2019-01-09 HISTORY — DX: Carpal tunnel syndrome, right upper limb: G56.01

## 2019-01-09 HISTORY — PX: CARPAL TUNNEL RELEASE: SHX101

## 2019-01-09 LAB — HEMOGLOBIN A1C
Hgb A1c MFr Bld: 5.7 % — ABNORMAL HIGH (ref 4.8–5.6)
Mean Plasma Glucose: 116.89 mg/dL

## 2019-01-09 LAB — GLUCOSE, CAPILLARY
Glucose-Capillary: 106 mg/dL — ABNORMAL HIGH (ref 70–99)
Glucose-Capillary: 99 mg/dL (ref 70–99)

## 2019-01-09 SURGERY — CARPAL TUNNEL RELEASE
Anesthesia: Regional | Laterality: Right

## 2019-01-09 MED ORDER — HYDROCODONE-ACETAMINOPHEN 5-325 MG PO TABS: 1.0000 | ORAL_TABLET | ORAL | 0 refills | Status: DC | PRN

## 2019-01-09 MED ORDER — MIDAZOLAM HCL 2 MG/2ML IJ SOLN
INTRAMUSCULAR | Status: AC
  Filled 2019-01-09: qty 2

## 2019-01-09 MED ORDER — PROPOFOL 10 MG/ML IV BOLUS
INTRAVENOUS | Status: DC | PRN
  Administered 2019-01-09: 10 mg via INTRAVENOUS

## 2019-01-09 MED ORDER — CHLORHEXIDINE GLUCONATE 4 % EX LIQD: 60.0000 mL | Freq: Once | CUTANEOUS | Status: DC

## 2019-01-09 MED ORDER — PROPOFOL 500 MG/50ML IV EMUL
INTRAVENOUS | Status: DC | PRN
  Administered 2019-01-09: 100 ug/kg/min via INTRAVENOUS

## 2019-01-09 MED ORDER — BUPIVACAINE HCL (PF) 0.5 % IJ SOLN
INTRAMUSCULAR | Status: AC
  Filled 2019-01-09: qty 30

## 2019-01-09 MED ORDER — LIDOCAINE HCL (PF) 0.5 % IJ SOLN
INTRAMUSCULAR | Status: DC | PRN
  Administered 2019-01-09: 50 mL via INTRAVENOUS

## 2019-01-09 MED ORDER — KETAMINE HCL 50 MG/5ML IJ SOSY
PREFILLED_SYRINGE | INTRAMUSCULAR | Status: AC
  Filled 2019-01-09: qty 5

## 2019-01-09 MED ORDER — FENTANYL CITRATE (PF) 100 MCG/2ML IJ SOLN
25.0000 ug | INTRAMUSCULAR | Status: DC | PRN
  Administered 2019-01-09: 09:00:00 25 ug via INTRAVENOUS
  Filled 2019-01-09: qty 2

## 2019-01-09 MED ORDER — BUPIVACAINE HCL (PF) 0.5 % IJ SOLN
INTRAMUSCULAR | Status: DC | PRN
  Administered 2019-01-09: 5 mL

## 2019-01-09 MED ORDER — HYDROCODONE-ACETAMINOPHEN 5-325 MG PO TABS: 1.0000 | ORAL_TABLET | ORAL | 0 refills | Status: AC | PRN

## 2019-01-09 MED ORDER — CEFAZOLIN SODIUM-DEXTROSE 2-4 GM/100ML-% IV SOLN
2.0000 g | INTRAVENOUS | Status: AC
  Administered 2019-01-09 (×2): 2 g via INTRAVENOUS
  Filled 2019-01-09: qty 100

## 2019-01-09 MED ORDER — MIDAZOLAM HCL 5 MG/5ML IJ SOLN
INTRAMUSCULAR | Status: DC | PRN
  Administered 2019-01-09: 2 mg via INTRAVENOUS

## 2019-01-09 MED ORDER — FENTANYL CITRATE (PF) 100 MCG/2ML IJ SOLN
INTRAMUSCULAR | Status: AC
  Filled 2019-01-09: qty 2

## 2019-01-09 MED ORDER — 0.9 % SODIUM CHLORIDE (POUR BTL) OPTIME
TOPICAL | Status: DC | PRN
  Administered 2019-01-09: 1000 mL

## 2019-01-09 MED ORDER — LACTATED RINGERS IV SOLN
INTRAVENOUS | Status: DC | PRN
  Administered 2019-01-09: 07:00:00 via INTRAVENOUS

## 2019-01-09 MED ORDER — KETAMINE HCL 10 MG/ML IJ SOLN
INTRAMUSCULAR | Status: DC | PRN
  Administered 2019-01-09 (×2): 10 mg via INTRAVENOUS

## 2019-01-09 MED ORDER — FENTANYL CITRATE (PF) 100 MCG/2ML IJ SOLN
INTRAMUSCULAR | Status: DC | PRN
  Administered 2019-01-09 (×2): 50 ug via INTRAVENOUS

## 2019-01-09 SURGICAL SUPPLY — 43 items
APL PRP STRL LF DISP 70% ISPRP (MISCELLANEOUS) ×1
BANDAGE ELASTIC 3 LF NS (GAUZE/BANDAGES/DRESSINGS) ×3 IMPLANT
BANDAGE ESMARK 4X12 BL STRL LF (DISPOSABLE) ×1 IMPLANT
BLADE SURG 15 STRL LF DISP TIS (BLADE) ×1 IMPLANT
BLADE SURG 15 STRL SS (BLADE) ×3
BNDG CMPR 12X4 ELC STRL LF (DISPOSABLE) ×1
BNDG CMPR MED 5X3 ELC HKLP NS (GAUZE/BANDAGES/DRESSINGS) ×1
BNDG COHESIVE 4X5 TAN STRL (GAUZE/BANDAGES/DRESSINGS) ×3 IMPLANT
BNDG ESMARK 4X12 BLUE STRL LF (DISPOSABLE) ×3
BNDG GAUZE ELAST 4 BULKY (GAUZE/BANDAGES/DRESSINGS) ×3 IMPLANT
CHLORAPREP W/TINT 26 (MISCELLANEOUS) ×3 IMPLANT
CLOTH BEACON ORANGE TIMEOUT ST (SAFETY) ×3 IMPLANT
COVER LIGHT HANDLE STERIS (MISCELLANEOUS) ×6 IMPLANT
COVER WAND RF STERILE (DRAPES) ×3 IMPLANT
CUFF TOURN SGL QUICK 18X4 (TOURNIQUET CUFF) ×3 IMPLANT
DECANTER SPIKE VIAL GLASS SM (MISCELLANEOUS) ×3 IMPLANT
DRAPE PROXIMA HALF (DRAPES) ×3 IMPLANT
DRSG XEROFORM 1X8 (GAUZE/BANDAGES/DRESSINGS) ×3 IMPLANT
ELECT NDL TIP 2.8 STRL (NEEDLE) ×1 IMPLANT
ELECT NEEDLE TIP 2.8 STRL (NEEDLE) ×3 IMPLANT
ELECT REM PT RETURN 9FT ADLT (ELECTROSURGICAL) ×3
ELECTRODE REM PT RTRN 9FT ADLT (ELECTROSURGICAL) ×1 IMPLANT
GAUZE SPONGE 4X4 12PLY STRL (GAUZE/BANDAGES/DRESSINGS) ×3 IMPLANT
GLOVE BIOGEL PI IND STRL 7.0 (GLOVE) ×1 IMPLANT
GLOVE BIOGEL PI INDICATOR 7.0 (GLOVE) ×2
GLOVE ECLIPSE 6.5 STRL STRAW (GLOVE) ×4 IMPLANT
GLOVE SKINSENSE NS SZ8.0 LF (GLOVE) ×2
GLOVE SKINSENSE STRL SZ8.0 LF (GLOVE) ×1 IMPLANT
GLOVE SS N UNI LF 8.5 STRL (GLOVE) ×3 IMPLANT
GOWN STRL REUS W/ TWL LRG LVL3 (GOWN DISPOSABLE) ×1 IMPLANT
GOWN STRL REUS W/TWL LRG LVL3 (GOWN DISPOSABLE) ×3
GOWN STRL REUS W/TWL XL LVL3 (GOWN DISPOSABLE) ×3 IMPLANT
KIT TURNOVER KIT A (KITS) ×3 IMPLANT
MANIFOLD NEPTUNE II (INSTRUMENTS) ×3 IMPLANT
NDL HYPO 21X1.5 SAFETY (NEEDLE) ×1 IMPLANT
NEEDLE HYPO 21X1.5 SAFETY (NEEDLE) ×3 IMPLANT
NS IRRIG 1000ML POUR BTL (IV SOLUTION) ×3 IMPLANT
PACK BASIC LIMB (CUSTOM PROCEDURE TRAY) ×3 IMPLANT
PAD ARMBOARD 7.5X6 YLW CONV (MISCELLANEOUS) ×3 IMPLANT
POSITIONER HAND ALUMI XLG (MISCELLANEOUS) ×3 IMPLANT
SET BASIN LINEN APH (SET/KITS/TRAYS/PACK) ×3 IMPLANT
SUT ETHILON 3 0 FSL (SUTURE) ×3 IMPLANT
SYR CONTROL 10ML LL (SYRINGE) ×3 IMPLANT

## 2019-01-09 NOTE — Interval H&P Note (Signed)
History and Physical Interval Note:  01/09/2019 7:27 AM  Diana Jensen  has presented today for surgery, with the diagnosis of right carpal tunnel syndrome.  The various methods of treatment have been discussed with the patient and family. After consideration of risks, benefits and other options for treatment, the patient has consented to  Procedure(s): RIGHT CARPAL TUNNEL RELEASE (Right) as a surgical intervention.  The patient's history has been reviewed, patient examined, no change in status, stable for surgery.  I have reviewed the patient's chart and labs.  Questions were answered to the patient's satisfaction.     Arther Abbott

## 2019-01-09 NOTE — Anesthesia Preprocedure Evaluation (Signed)
Anesthesia Evaluation  Patient identified by MRN, date of birth, ID band Patient awake    Reviewed: Allergy & Precautions, H&P , NPO status , Patient's Chart, lab work & pertinent test results, reviewed documented beta blocker date and time   Airway Mallampati: III  TM Distance: >3 FB Neck ROM: full    Dental  (+) Teeth Intact   Pulmonary Current Smoker,    Pulmonary exam normal        Cardiovascular Normal cardiovascular exam     Neuro/Psych PSYCHIATRIC DISORDERS Anxiety Depression negative neurological ROS     GI/Hepatic negative GI ROS, Neg liver ROS,   Endo/Other  negative endocrine ROS  Renal/GU negative Renal ROS  negative genitourinary   Musculoskeletal   Abdominal   Peds  Hematology negative hematology ROS (+)   Anesthesia Other Findings   Reproductive/Obstetrics                             Anesthesia Physical Anesthesia Plan  ASA: III  Anesthesia Plan: Bier Block and Bier Block-Lidocaine Only   Post-op Pain Management:    Induction:   PONV Risk Score and Plan: 1 and TIVA  Airway Management Planned:   Additional Equipment:   Intra-op Plan:   Post-operative Plan:   Informed Consent: I have reviewed the patients History and Physical, chart, labs and discussed the procedure including the risks, benefits and alternatives for the proposed anesthesia with the patient or authorized representative who has indicated his/her understanding and acceptance.       Plan Discussed with: CRNA  Anesthesia Plan Comments:         Anesthesia Quick Evaluation

## 2019-01-09 NOTE — Discharge Instructions (Signed)

## 2019-01-09 NOTE — Anesthesia Postprocedure Evaluation (Signed)
Anesthesia Post Note  Patient: Diana Jensen  Procedure(s) Performed: RIGHT CARPAL TUNNEL RELEASE (Right )  Patient location during evaluation: PACU Anesthesia Type: Bier Block Level of consciousness: awake and alert and oriented Pain management: pain level controlled Vital Signs Assessment: post-procedure vital signs reviewed and stable Respiratory status: spontaneous breathing Postop Assessment: no apparent nausea or vomiting and adequate PO intake Anesthetic complications: no     Last Vitals:  Vitals:   01/09/19 0830 01/09/19 0845  BP: (!) 145/73 (!) 145/94  Pulse: 67 69  Resp: 14 16  Temp:    SpO2: 99% 96%    Last Pain:  Vitals:   01/09/19 0845  TempSrc:   PainSc: 3                  Khaled Herda

## 2019-01-09 NOTE — Brief Op Note (Signed)
01/09/2019  8:15 AM  PATIENT:  Diana Jensen  43 y.o. female  PRE-OPERATIVE DIAGNOSIS:  right carpal tunnel syndrome  POST-OPERATIVE DIAGNOSIS:  right carpal tunnel syndrome  PROCEDURE:  Procedure(s): RIGHT CARPAL TUNNEL RELEASE (Right) - SURGEON:  Surgeon(s) and Role:    Carole Civil, MD - Primary  PHYSICIAN ASSISTANT:   ASSISTANTS: none   ANESTHESIA:   regional  EBL:  none   BLOOD ADMINISTERED:none  DRAINS: none   LOCAL MEDICATIONS USED:  MARCAINE     SPECIMEN:  No Specimen  DISPOSITION OF SPECIMEN:  N/A  COUNTS:  YES  TOURNIQUET:   28 minutes at 250 mm  DICTATION: .Dragon Dictation  PLAN OF CARE: Discharge to home after PACU  PATIENT DISPOSITION:  PACU - hemodynamically stable.   Delay start of Pharmacological VTE agent (>24hrs) due to surgical blood loss or risk of bleeding: not applicable   Carpal tunnel release right wrist  Preop diagnosis carpal tunnel syndrome right wrist postop diagnosis same  Procedure open carpal tunnel release right wrist  Surgeon Aline Brochure  Anesthesia regional Bier block  Operative findings compression of the right median nerve with apple core disfiguration slight graying in terms of color  Indications failure of conservative treatment to relieve pain and paresthesias and numbness and tingling of the right hand.  The patient was identified in the preop area we confirm the surgical site marked as right wrist. Chart update completed. Patient taken to surgery. She had 2 g of Ancef. After establishing a Bier block her arm was prepped with ChloraPrep.  Timeout executed completed and confirmed site.  A straight incision was made over the carpal tunnel in line with the radial border of the ring finger. Blunt dissection was carried out to find the distal aspect of the carpal tunnel. A blunted judgment was passed beneath the carpal tunnel. Sharp incision was then used to release the transverse carpal ligament.  This was  confirmed by passing a freer elevator into the distal forearm through the transverse carpal ligament.  The contents of the carpal tunnel were inspected. The median nerve was compressed with slight discoloration.  The wound was irrigated and then closed with 3-0 nylon suture. We injected 10 mL of plain Marcaine on the radial side of the incision  A sterile bandage was applied and the tourniquet was released the color of the hand and capillary refill were normal  The patient was taken to the recovery room in stable condition

## 2019-01-09 NOTE — Op Note (Signed)
01/09/2019  8:15 AM  PATIENT:  Diana Jensen  43 y.o. female  PRE-OPERATIVE DIAGNOSIS:  right carpal tunnel syndrome  POST-OPERATIVE DIAGNOSIS:  right carpal tunnel syndrome  PROCEDURE:  Procedure(s): RIGHT CARPAL TUNNEL RELEASE (Right) - [id number] SURGEON:  Surgeon(s) and Role:    Carole Civil, MD - Primary  PHYSICIAN ASSISTANT:   ASSISTANTS: none   ANESTHESIA:   regional  EBL:  none   BLOOD ADMINISTERED:none  DRAINS: none   LOCAL MEDICATIONS USED:  MARCAINE     SPECIMEN:  No Specimen  DISPOSITION OF SPECIMEN:  N/A  COUNTS:  YES  TOURNIQUET:   28 minutes at 250 mm  DICTATION: .Dragon Dictation  PLAN OF CARE: Discharge to home after PACU  PATIENT DISPOSITION:  PACU - hemodynamically stable.   Delay start of Pharmacological VTE agent (>24hrs) due to surgical blood loss or risk of bleeding: not applicable   Carpal tunnel release right wrist  Preop diagnosis carpal tunnel syndrome right wrist postop diagnosis same  Procedure open carpal tunnel release right wrist  Surgeon Aline Brochure  Anesthesia regional Bier block  Operative findings compression of the right median nerve with apple core disfiguration slight graying in terms of color  Indications failure of conservative treatment to relieve pain and paresthesias and numbness and tingling of the right hand.  The patient was identified in the preop area we confirm the surgical site marked as right wrist. Chart update completed. Patient taken to surgery. She had 2 g of Ancef. After establishing a Bier block her arm was prepped with ChloraPrep.  Timeout executed completed and confirmed site.  A straight incision was made over the carpal tunnel in line with the radial border of the ring finger. Blunt dissection was carried out to find the distal aspect of the carpal tunnel. A blunted judgment was passed beneath the carpal tunnel. Sharp incision was then used to release the transverse carpal ligament.  This  was confirmed by passing a freer elevator into the distal forearm through the transverse carpal ligament.  The contents of the carpal tunnel were inspected. The median nerve was compressed with slight discoloration.  The wound was irrigated and then closed with 3-0 nylon suture. We injected 10 mL of plain Marcaine on the radial side of the incision  A sterile bandage was applied and the tourniquet was released the color of the hand and capillary refill were normal  The patient was taken to the recovery room in stable condition

## 2019-01-09 NOTE — Anesthesia Procedure Notes (Signed)
Anesthesia Regional Block: Bier block (IV Regional)   Pre-Anesthetic Checklist: ,, timeout performed, Correct Patient, Correct Site, Correct Laterality, Correct Procedure,, site marked, surgical consent,, at surgeon's request  Laterality: Right     Needles:  Injection technique: Single-shot  Needle Type: Other      Needle Gauge: 22     Additional Needles:   Procedures:,,,,, intact distal pulses, Esmarch exsanguination, single tourniquet utilized,   Nerve Stimulator or Paresthesia:   Additional Responses:  Pulse checked post tourniquet inflation. IV NSL discontinued post injection. Narrative:   Performed by: Personally       

## 2019-01-09 NOTE — Transfer of Care (Signed)
Immediate Anesthesia Transfer of Care Note  Patient: Diana Jensen  Procedure(s) Performed: RIGHT CARPAL TUNNEL RELEASE (Right )  Patient Location: PACU  Anesthesia Type:Bier block  Level of Consciousness: awake  Airway & Oxygen Therapy: Patient Spontanous Breathing  Post-op Assessment: Report given to RN  Post vital signs: Reviewed and stable  Last Vitals:  Vitals Value Taken Time  BP 126/88 01/09/19 0819  Temp    Pulse 68 01/09/19 0822  Resp 19 01/09/19 0822  SpO2 100 % 01/09/19 0822  Vitals shown include unvalidated device data.  Last Pain:  Vitals:   01/09/19 0641  TempSrc: Oral  PainSc: 0-No pain      Patients Stated Pain Goal: 5 (33/61/22 4497)  Complications: No apparent anesthesia complications

## 2019-01-10 ENCOUNTER — Encounter (HOSPITAL_COMMUNITY): Payer: Self-pay | Admitting: Orthopedic Surgery

## 2019-01-14 DIAGNOSIS — Z9889 Other specified postprocedural states: Secondary | ICD-10-CM | POA: Insufficient documentation

## 2019-01-17 ENCOUNTER — Ambulatory Visit (INDEPENDENT_AMBULATORY_CARE_PROVIDER_SITE_OTHER): Payer: BC Managed Care – PPO | Admitting: Orthopedic Surgery

## 2019-01-17 ENCOUNTER — Other Ambulatory Visit: Payer: Self-pay

## 2019-01-17 ENCOUNTER — Encounter: Payer: Self-pay | Admitting: Orthopedic Surgery

## 2019-01-17 VITALS — Temp 97.5°F

## 2019-01-17 DIAGNOSIS — Z9889 Other specified postprocedural states: Secondary | ICD-10-CM

## 2019-01-17 NOTE — Progress Notes (Signed)
  Shadee is status post right carpal tunnel release   S:   Chief Complaint  Patient presents with  . Routine Post Op    right hand CTR 01/09/2019   Patient reports that they're doing well with mild discomfort.  The incision is clean dry and intact with no drainage The dressing was changed.  Patient to return for suture removal ~ POD 14-17

## 2019-01-22 ENCOUNTER — Telehealth: Payer: Self-pay

## 2019-01-22 NOTE — Telephone Encounter (Signed)
I would have to diagnose the problem with the left hand to see if injection is appropriate

## 2019-01-22 NOTE — Telephone Encounter (Signed)
Patient called asking if she could get an injection in her left hand when she comes in on 8/3 to have her sutures removed. States she has been over using her left hand since she had her surgery and thought a shot would help.  Please advise

## 2019-01-22 NOTE — Telephone Encounter (Signed)
Spoke with patient and relayed Dr. Ruthe Mannan message.

## 2019-01-27 ENCOUNTER — Encounter: Payer: Self-pay | Admitting: Orthopedic Surgery

## 2019-01-27 ENCOUNTER — Other Ambulatory Visit: Payer: Self-pay

## 2019-01-27 ENCOUNTER — Ambulatory Visit (INDEPENDENT_AMBULATORY_CARE_PROVIDER_SITE_OTHER): Payer: BC Managed Care – PPO | Admitting: Orthopedic Surgery

## 2019-01-27 VITALS — Temp 98.2°F

## 2019-01-27 DIAGNOSIS — Z9889 Other specified postprocedural states: Secondary | ICD-10-CM

## 2019-01-27 NOTE — Progress Notes (Signed)
POSTOP VISIT  POD # 18  Chief Complaint  Patient presents with  . Post-op Follow-up    right hand 01/09/19 carpal tunnel release     43 years old 18 days postop carpal tunnel release came in for suture removal  She still has some soreness in the wrist and fingertips are still a little numb  The wound looks great   Encounter Diagnosis  Name Primary?  . S/P carpal tunnel release right 01/09/2019 Yes    Continue active range of motion exercises  Postoperative plan (Work, WB, No orders of the defined types were placed in this encounter. ,FU)  Work return will be sometime over the next 4 to 6 weeks follow-up in 6 weeks

## 2019-03-10 ENCOUNTER — Encounter: Payer: Self-pay | Admitting: Orthopedic Surgery

## 2019-03-10 ENCOUNTER — Other Ambulatory Visit: Payer: Self-pay

## 2019-03-10 ENCOUNTER — Ambulatory Visit (INDEPENDENT_AMBULATORY_CARE_PROVIDER_SITE_OTHER): Payer: PRIVATE HEALTH INSURANCE | Admitting: Orthopedic Surgery

## 2019-03-10 DIAGNOSIS — Z9889 Other specified postprocedural states: Secondary | ICD-10-CM

## 2019-03-10 NOTE — Patient Instructions (Signed)
Release to work

## 2019-03-10 NOTE — Progress Notes (Signed)
Chief Complaint  Patient presents with  . Routine Post Op    right CTR 01/09/19 has decreased ROM wrist     6 weeks status post open carpal tunnel release she has some mild complaints 1 of decreased extension 1 of fingertip numbness on the radial side of the ring finger from the DIP joint distally of the long finger and some at the tip of the thumb  She has sensitivity to the wound   The examination shows that the incision healed it is sensitive to touch she does have a little bit of decreased extension but can make a full fist the sensory loss is confirmed by exam color capillary refill looks good  I think she can be released to normal activities her sensation should improve sensitivity should improve as well and I asked her to do wrist extension exercises to help with the motion  Encounter Diagnosis  Name Primary?  . S/P carpal tunnel release right 01/09/2019 Yes    RELEASED

## 2019-06-05 ENCOUNTER — Other Ambulatory Visit (INDEPENDENT_AMBULATORY_CARE_PROVIDER_SITE_OTHER): Payer: Self-pay | Admitting: Nurse Practitioner

## 2019-06-21 ENCOUNTER — Other Ambulatory Visit (INDEPENDENT_AMBULATORY_CARE_PROVIDER_SITE_OTHER): Payer: Self-pay | Admitting: Nurse Practitioner

## 2019-10-25 ENCOUNTER — Emergency Department (HOSPITAL_COMMUNITY): Payer: Managed Care, Other (non HMO)

## 2019-10-25 ENCOUNTER — Encounter (HOSPITAL_COMMUNITY): Payer: Self-pay | Admitting: *Deleted

## 2019-10-25 ENCOUNTER — Emergency Department (HOSPITAL_COMMUNITY)
Admission: EM | Admit: 2019-10-25 | Discharge: 2019-10-25 | Disposition: A | Discharge status: Home / Self Care | Payer: Managed Care, Other (non HMO) | Attending: Emergency Medicine | Admitting: Emergency Medicine

## 2019-10-25 DIAGNOSIS — R0789 Other chest pain: Secondary | ICD-10-CM

## 2019-10-25 DIAGNOSIS — R079 Chest pain, unspecified: Secondary | ICD-10-CM | POA: Diagnosis present

## 2019-10-25 DIAGNOSIS — F17210 Nicotine dependence, cigarettes, uncomplicated: Secondary | ICD-10-CM | POA: Insufficient documentation

## 2019-10-25 LAB — CBC
HCT: 42.7 % (ref 36.0–46.0)
Hemoglobin: 13.8 g/dL (ref 12.0–15.0)
MCH: 28.3 pg (ref 26.0–34.0)
MCHC: 32.3 g/dL (ref 30.0–36.0)
MCV: 87.7 fL (ref 80.0–100.0)
Platelets: 280 10*3/uL (ref 150–400)
RBC: 4.87 MIL/uL (ref 3.87–5.11)
RDW: 12.8 % (ref 11.5–15.5)
WBC: 8.5 10*3/uL (ref 4.0–10.5)
nRBC: 0 % (ref 0.0–0.2)

## 2019-10-25 LAB — PREGNANCY, URINE: Preg Test, Ur: NEGATIVE

## 2019-10-25 LAB — URINALYSIS, ROUTINE W REFLEX MICROSCOPIC
Bilirubin Urine: NEGATIVE
Glucose, UA: NEGATIVE mg/dL
Hgb urine dipstick: NEGATIVE
Ketones, ur: NEGATIVE mg/dL
Leukocytes,Ua: NEGATIVE
Nitrite: NEGATIVE
Protein, ur: NEGATIVE mg/dL
Specific Gravity, Urine: 1.01 (ref 1.005–1.030)
pH: 7 (ref 5.0–8.0)

## 2019-10-25 LAB — TROPONIN I (HIGH SENSITIVITY)
Troponin I (High Sensitivity): 3 ng/L (ref <18)
Troponin I (High Sensitivity): 3 ng/L (ref <18)

## 2019-10-25 LAB — BASIC METABOLIC PANEL
Anion gap: 11 (ref 5–15)
BUN: 18 mg/dL (ref 6–20)
CO2: 25 mmol/L (ref 22–32)
Calcium: 8.5 mg/dL — ABNORMAL LOW (ref 8.9–10.3)
Chloride: 103 mmol/L (ref 98–111)
Creatinine, Ser: 0.75 mg/dL (ref 0.44–1.00)
GFR calc Af Amer: >60 mL/min (ref >60)
GFR calc non Af Amer: >60 mL/min (ref >60)
Glucose, Bld: 97 mg/dL (ref 70–99)
Potassium: 3.4 mmol/L — ABNORMAL LOW (ref 3.5–5.1)
Sodium: 139 mmol/L (ref 135–145)

## 2019-10-25 NOTE — ED Triage Notes (Signed)
C/o high blood pressure reading earlier today, funny feeling in her chest

## 2019-10-25 NOTE — ED Provider Notes (Signed)
[facility] Provider Note   CSN: [id number] Arrival date & time: 10/25/19  1808     History Chief Complaint  Patient presents with  . Chest Pain    Diana Jensen is a 44 y.o. female with past medical history significant for anxiety and clotting disorder presents to the ED with complaints of a "funny feeling" in her chest after having a high blood pressure reading earlier today.  Patient states that she felt acutely lightheaded and was experiencing a headache that prompted her to have her blood pressure checked.  She states that it was [id number] systolic and she was advised to come to the ED for evaluation.  She also developed a "twinge" pinching sensation that radiated across her chest.  Onset of chest pain around 2 PM.  She endorses a 25-pack-year smoking history and knows that she needs to quit.  She states that she has antiphospholipid syndrome which was a concern particularly when she was pregnant.  However, she does not take any anticoagulants and simply has to ensure that she is walking around when traveling for extended periods of time.  She felt perfectly fine this morning when she woke up and immediately prior to her onset of symptoms.  She denies any room spinning dizziness, blurred vision, exertional chest pain, shortness of breath, cough, recent illness or infection, abdominal pain, nausea or vomiting, or other symptoms.  HPI  HPI: A 44 year old patient with a history of hypertension and obesity presents for evaluation of chest pain. Initial onset of pain was more than 6 hours ago. The patient's chest pain is not worse with exertion. The patient's chest pain is not middle- or left-sided, is not well-localized, is not described as heaviness/pressure/tightness, is not sharp and does not radiate to the arms/jaw/neck. The patient does not complain of nausea and denies diaphoresis. The patient has smoked in the past 90 days. The patient has no history of stroke, has no history  of peripheral artery disease, denies any history of treated diabetes, has no relevant family history of coronary artery disease (first degree relative at less than age 51) and has no history of hypercholesterolemia.   Past Medical History:  Diagnosis Date  . Allergy    seasonal  . Anxiety   . Arthritis    deterioration of spine, knees  . Carpal tunnel syndrome of right wrist   . Clotting disorder (HCC)    antiphospholipid syndrome  . Depression     Patient Active Problem List   Diagnosis Date Noted  . S/P carpal tunnel release right 01/09/2019 01/14/2019  . Carpal tunnel syndrome of right wrist   . 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 Antiphospholipid syndrome ([place]) 02/22/2016  . Tobacco abuse 02/22/2016  . Family history of breast cancer 02/22/2016  . Lumbago 02/22/2016  . Sleep disorder 02/22/2016  . Depression, major, recurrent, in partial remission ([place]) 02/22/2016    Past Surgical History:  Procedure Laterality Date  . ABDOMINAL HYSTERECTOMY  2011   partial - bleeding  . CARPAL TUNNEL RELEASE Right 01/09/2019   Procedure: RIGHT CARPAL TUNNEL RELEASE;  Surgeon: Carole Civil, MD;  Location: [facility];  Service: Orthopedics;  Laterality: Right;  . DILATION AND CURETTAGE OF UTERUS     x2  . EYE SURGERY     lazy eye     OB History   No obstetric history on file.     Family History  Problem Relation Age of Onset  . Cancer Mother  breast   . Arthritis Mother   . Hypertension Mother   . Thyroid disease Father   . Mental illness Sister        depression ? bipolar  . Dementia Maternal Grandmother   . Cancer Maternal Grandfather   . Heart disease Paternal Grandmother   . Cancer Paternal Grandfather     Social History   Tobacco Use  . Smoking status: Current Every Day Smoker    Packs/day: 1.00    Years: 20.00    Pack years: 20.00    Types: Cigarettes  . Smokeless tobacco: Never Used  Substance Use Topics  . Alcohol use: No  . Drug use:  No    Home Medications Prior to Admission medications   Medication Sig Start Date End Date Taking? Authorizing Provider  acetaminophen (TYLENOL) 500 MG tablet Take 1,000 mg by mouth every 6 (six) hours as needed for moderate pain or headache.    Yes [provider]  Cholecalciferol (VITAMIN D) 50 MCG (2000 UT) CAPS Take 4,000 Units by mouth daily.    Yes [provider]  DULoxetine (CYMBALTA) 60 MG capsule Take 60 mg by mouth daily. 09/14/17  Yes [provider]  hydrOXYzine (VISTARIL) 50 MG capsule Take 50 mg by mouth 2 (two) times daily. 08/14/19  Yes [provider]  meloxicam (MOBIC) 15 MG tablet Take 1 tablet (15 mg total) by mouth daily. TAKE WITH FOOD 09/17/17  Yes Raylene Everts, MD  Turmeric (QC TUMERIC COMPLEX PO) Take 1 capsule by mouth daily.    Yes [provider]    Allergies    Patient has no known allergies.  Review of Systems   Review of Systems  Constitutional: Negative for fever.  Eyes: Negative for visual disturbance.  Respiratory: Negative for shortness of breath.   Cardiovascular: Positive for chest pain.  Gastrointestinal: Negative for abdominal pain, nausea and vomiting.  Neurological: Positive for light-headedness and headaches.    Physical Exam Updated Vital Signs BP (!) 151/99   Pulse 78   Temp 98.2 F (36.8 C)   Resp 20   Ht 5\' 7"  (1.702 m)   Wt 113.4 kg   SpO2 100%   BMI 39.16 kg/m   Physical Exam Vitals and nursing note reviewed. Exam conducted with a chaperone present.  Constitutional:      General: She is not in acute distress.    Appearance: Normal appearance. She is not ill-appearing or diaphoretic.  HENT:     Head: Normocephalic and atraumatic.  Eyes:     General: No scleral icterus.    Conjunctiva/sclera: Conjunctivae normal.  Neck:     Comments: No meningismus. Cardiovascular:     Rate and Rhythm: Normal rate and regular rhythm.     Pulses: Normal pulses.     Heart sounds: Normal  heart sounds.     Comments: No murmurs appreciated.  Equal pulses bilaterally. Pulmonary:     Effort: Pulmonary effort is normal. No respiratory distress.     Breath sounds: Normal breath sounds. No wheezing or rales.  Abdominal:     General: Abdomen is flat. There is no distension.     Tenderness: There is no abdominal tenderness. There is no guarding.  Musculoskeletal:     Cervical back: Normal range of motion. No rigidity.  Skin:    General: Skin is dry.     Capillary Refill: Capillary refill takes less than 2 seconds.  Neurological:     Mental Status: She is alert and  oriented to person, place, and time.     GCS: GCS eye subscore is 4. GCS verbal subscore is 5. GCS motor subscore is 6.  Psychiatric:        Mood and Affect: Mood normal.        Behavior: Behavior normal.        Thought Content: Thought content normal.     ED Results / Procedures / Treatments   Labs (all labs ordered are listed, but only abnormal results are displayed) Labs Reviewed  BASIC METABOLIC PANEL - Abnormal; Notable for the following components:      Result Value   Potassium 3.4 (*)    Calcium 8.5 (*)    All other components within normal limits  URINALYSIS, ROUTINE W REFLEX MICROSCOPIC - Abnormal; Notable for the following components:   APPearance HAZY (*)    All other components within normal limits  CBC  PREGNANCY, URINE  TROPONIN I (HIGH SENSITIVITY)  TROPONIN I (HIGH SENSITIVITY)    EKG EKG Interpretation  Date/Time:  Saturday Oct 25 2019 18:28:29 EDT Ventricular Rate:  87 PR Interval:  184 QRS Duration: 74 QT Interval:  356 QTC Calculation: 428 R Axis:   -4 Text Interpretation: Normal sinus rhythm Low voltage QRS Cannot rule out Anterior infarct , age undetermined Abnormal ECG No STEMI Confirmed by Nanda Quinton [phone]) on 10/25/2019 6:38:28 PM   Radiology DG Chest 2 View  Result Date: 10/25/2019 CLINICAL DATA:  High blood pressure, funny feeling in chest EXAM: CHEST - 2 VIEW  COMPARISON:  None FINDINGS: Upper normal heart size. Mediastinal contours and pulmonary vascularity normal. Lungs clear. No infiltrate, pleural effusion, or pneumothorax. Osseous structures unremarkable. IMPRESSION: No acute abnormalities. Electronically Signed   By: Lavonia Dana M.D.   On: 10/25/2019 19:09    Procedures Procedures (including critical care time)  Medications Ordered in ED Medications - No data to display  ED Course  I have reviewed the triage vital signs and the nursing notes.  Pertinent labs & imaging results that were available during my care of the patient were reviewed by me and considered in my medical decision making (see chart for details).    MDM Rules/Calculators/A&P HEAR Score: 2                    Patient's history is concerning for symptomatic hypertension.  However, on my examination her blood pressure while on cardiac monitoring was much more reasonable 141/102.  No evidence of endorgan damage in her lab work.  She was no longer complaining of significant symptoms.  Her laboratory work-up was largely unremarkable.  EKG demonstrates low voltage QRS, but normal sinus rhythm and her troponin was WNL x2.  Do not need to continue trending.  Her CBC demonstrated no abnormalities such as anemia that might otherwise explain her symptoms of lightheadedness, headache, and chest discomfort.  BMP shows mild hypokalemia 3.4, encouraging patient to replenish with bananas and potatoes.  Her calcium was also mildly low at 8.5, encourage her to follow-up with her primary care provider for laboratory recheck.  Urine pregnancy was negative and UA demonstrates no evidence of infection.  I personally reviewed plain films obtained of chest which demonstrated no pneumonia, pneumothorax, or other acute cardiopulmonary findings.    On reexamination, patient feeling much improved.  She is accompanied by her husband to whom I also explained her work-up consistent with symptomatic high blood  pressure.  She denies any syncopal episode or presyncopal prodrome.  She never felt  as though she was going to pass out.  Her symptoms have improved as her blood pressure has come down.  I suspect that is attributed to her tobacco use in addition to elevated BMI and significant coffee intake.  Patient reports that she drinks over 1 pot per day.  Recommended that she follow-up with her PCP regarding today's encounter to discuss further.  Utilized the HEART Score which agrees that patient is relatively low risk for ACS and safe for discharge with outpatient follow-up.  Given her clotting disorder in conjunction with her acute onset symptoms of lightheadedness and chest discomfort, obviously there is some concern for pulmonary embolism.  However, engaging mutual decision making with patient and do not feel as though CTA of the chest is warranted at this time.  Patient is feeling improved and there was significant correlation with her elevated blood pressure.  No extremity swelling concerning for DVT.  No pleuritic chest discomfort or cough.  No SOB.   Strict ED return precautions discussed.  All of the evaluation and work-up results were discussed with the patient and any family at bedside. They were provided opportunity to ask any additional questions and have none at this time. They have expressed understanding of verbal discharge instructions as well as return precautions and are agreeable to the plan.   The patient was counseled on the dangers of tobacco use, and was advised to quit.  Reviewed strategies to maximize success, including removing cigarettes and smoking materials from environment, stress management, substitution of other forms of reinforcement, support of family/friends and written materials. Total time was 5 min CPT code 99406.    Final Clinical Impression(s) / ED Diagnoses Final diagnoses:  Chest discomfort    Rx / DC Orders ED Discharge Orders    None       Reita Chard 10/25/19 2157    Margette Fast, MD 10/28/19 1332

## 2019-10-25 NOTE — Discharge Instructions (Signed)
Please follow-up with your primary care provider regarding today's encounter.  You will need to have your labs rechecked in 3 to 5 days to ensure correction of your electrolyte derangement.  Eat plenty of bananas and potatoes.  Please discuss with them your desire to stop smoking.  There are numerous ways to approach tobacco cessation that works differently for each individual.    Return to the ED or seek immediate medical attention should you experience any new or worsening symptoms.

## 2019-10-28 ENCOUNTER — Other Ambulatory Visit: Payer: Self-pay

## 2019-10-28 ENCOUNTER — Ambulatory Visit (INDEPENDENT_AMBULATORY_CARE_PROVIDER_SITE_OTHER): Payer: Managed Care, Other (non HMO) | Admitting: Internal Medicine

## 2019-10-28 ENCOUNTER — Encounter (INDEPENDENT_AMBULATORY_CARE_PROVIDER_SITE_OTHER): Payer: Self-pay | Admitting: Internal Medicine

## 2019-10-28 VITALS — BP 142/88 | HR 87 | Temp 96.3°F | Resp 18 | Ht 67.0 in | Wt 241.8 lb

## 2019-10-28 DIAGNOSIS — I10 Essential (primary) hypertension: Secondary | ICD-10-CM | POA: Diagnosis not present

## 2019-10-28 DIAGNOSIS — R7303 Prediabetes: Secondary | ICD-10-CM | POA: Diagnosis not present

## 2019-10-28 DIAGNOSIS — Z6837 Body mass index (BMI) 37.0-37.9, adult: Secondary | ICD-10-CM

## 2019-10-28 DIAGNOSIS — E6609 Other obesity due to excess calories: Secondary | ICD-10-CM | POA: Diagnosis not present

## 2019-10-28 MED ORDER — LOSARTAN POTASSIUM 25 MG PO TABS: 25.0000 mg | ORAL_TABLET | Freq: Every day | ORAL | 3 refills | Status: DC

## 2019-10-28 NOTE — Progress Notes (Signed)
Metrics: Intervention Frequency ACO  Documented Smoking Status Yearly  Screened one or more times in 24 months  Cessation Counseling or  Active cessation medication Past 24 months  Past 24 months   Guideline developer: UpToDate (See UpToDate for funding source) Date Released: 2014       Wellness Office Visit  Subjective:  Patient ID: Diana Jensen, female    DOB: 1975-11-30  Age: 44 y.o. MRN: [id number]  CC: This patient comes to our office after a long hiatus.  She was seen in the emergency room for hypertension and chest discomfort. HPI  She had a work-up in the emergency room and it was not felt that the chest discomfort was cardiac in nature although it is entirely possible.  She was recommended to follow-up with [place].  We have not seen this patient for more than 1 year.  She was late for present appointment which was at 2 PM when she arrived at 2:18 PM.  I have spoken to her about this. Past Medical History:  Diagnosis Date  . Allergy    seasonal  . Anxiety   . Arthritis    deterioration of spine, knees  . Carpal tunnel syndrome of right wrist   . Clotting disorder (HCC)    antiphospholipid syndrome  . Depression       Family History  Problem Relation Age of Onset  . Cancer Mother        breast   . Arthritis Mother   . Hypertension Mother   . Thyroid disease Father   . Mental illness Sister        depression ? bipolar  . Dementia Maternal Grandmother   . Cancer Maternal Grandfather   . Heart disease Paternal Grandmother   . Cancer Paternal Grandfather     Social History   Social History Narrative   Associates degree in Psychologist, sport and exercise. 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 Has 2 children 1 son and 1 daughter. Married x 15 years.   Social History   Tobacco Use  . Smoking status: Current Every Day Smoker    Packs/day: 1.00    Years: 20.00    Pack years: 20.00    Types: Cigarettes  . Smokeless tobacco: Never Used  Substance Use  Topics  . Alcohol use: No    Current Meds  Medication Sig  . clonazePAM (KLONOPIN) 1 MG tablet Take 1 mg by mouth daily as needed for anxiety.  . DULoxetine (CYMBALTA) 60 MG capsule Take 60 mg by mouth daily.  . hydrOXYzine (VISTARIL) 50 MG capsule Take 50 mg by mouth daily as needed for anxiety.   . meloxicam (MOBIC) 15 MG tablet Take 1 tablet (15 mg total) by mouth daily. TAKE WITH FOOD  . [DISCONTINUED] acetaminophen (TYLENOL) 500 MG tablet Take 1,000 mg by mouth every 6 (six) hours as needed for moderate pain or headache.       Objective:   Today's Vitals: BP (!) 142/88 (BP Location: Right Arm, Patient Position: Sitting, Cuff Size: Normal)   Pulse 87   Temp (!) 96.3 F (35.7 C) (Temporal)   Resp 18   Ht 5\' 7"  (1.702 m)   Wt 241 lb 12.8 oz (109.7 kg)   SpO2 97%   BMI 37.87 kg/m  Vitals with BMI 10/28/2019 10/25/2019 10/25/2019  Height 5\' 7"  - -  Weight 241 lbs 13 oz - -  BMI [id number] - -  Systolic [id number] -  Diastolic 88 87 -  Pulse 87 - 78     Physical Exam   She is currently not having any chest discomfort or pain.  Her blood pressure is elevated.  She is obese.    Assessment   1. Essential hypertension, benign   2. Prediabetes   3. Class 2 obesity due to excess calories without serious comorbidity with body mass index (BMI) of 37.0 to 37.9 in adult       Tests ordered No orders of the defined types were placed in this encounter.    Plan: 1. Blood work done previously in the system showed a hemoglobin A 1C of 5.7% making her prediabetic.  We will need to discuss nutrition on the next visit. 2. Her hypertension is uncontrolled and I am going to start her on losartan. 3. I will see her for close follow-up in 1 month's time to monitor her hypertension and also discussed nutrition and do blood work.   Meds ordered this encounter  Medications  . losartan (COZAAR) 25 MG tablet    Sig: Take 1 tablet (25 mg total) by mouth daily.    Dispense:  30 tablet     Refill:  3    Keats Kingry Luther Parody, MD

## 2019-11-10 ENCOUNTER — Telehealth (INDEPENDENT_AMBULATORY_CARE_PROVIDER_SITE_OTHER): Payer: Self-pay

## 2019-11-10 NOTE — Telephone Encounter (Signed)
Diana Jensen is calling stating that she is still having the angina pain she is wondering if her medications need to be adjusted, please advise?

## 2019-11-10 NOTE — Telephone Encounter (Signed)
She has 2 options: 1.  Go to the emergency room now if she feels that the pain is much worse and is an emergency. 2.  Schedule her an appointment tomorrow to see me.

## 2019-11-10 NOTE — Telephone Encounter (Signed)
Patient is scheduled for 3:40 tomorrow afternoon

## 2019-11-11 ENCOUNTER — Other Ambulatory Visit: Payer: Self-pay

## 2019-11-11 ENCOUNTER — Ambulatory Visit (INDEPENDENT_AMBULATORY_CARE_PROVIDER_SITE_OTHER): Payer: Managed Care, Other (non HMO) | Admitting: Internal Medicine

## 2019-11-11 ENCOUNTER — Encounter (INDEPENDENT_AMBULATORY_CARE_PROVIDER_SITE_OTHER): Payer: Self-pay | Admitting: Internal Medicine

## 2019-11-11 VITALS — BP 120/80 | HR 84 | Temp 97.7°F | Ht 67.0 in | Wt 264.0 lb

## 2019-11-11 DIAGNOSIS — I10 Essential (primary) hypertension: Secondary | ICD-10-CM | POA: Diagnosis not present

## 2019-11-11 DIAGNOSIS — E782 Mixed hyperlipidemia: Secondary | ICD-10-CM

## 2019-11-11 DIAGNOSIS — R232 Flushing: Secondary | ICD-10-CM | POA: Diagnosis not present

## 2019-11-11 DIAGNOSIS — R7303 Prediabetes: Secondary | ICD-10-CM | POA: Diagnosis not present

## 2019-11-11 DIAGNOSIS — R079 Chest pain, unspecified: Secondary | ICD-10-CM

## 2019-11-11 NOTE — Progress Notes (Signed)
Metrics: Intervention Frequency ACO  Documented Smoking Status Yearly  Screened one or more times in 24 months  Cessation Counseling or  Active cessation medication Past 24 months  Past 24 months   Guideline developer: UpToDate (See UpToDate for funding source) Date Released: 2014       Wellness Office Visit  Subjective:  Patient ID: Diana Jensen, female    DOB: 1976/02/13  Age: 44 y.o. MRN: [id number]  CC: Chest discomfort HPI This lady comes in once again with what she describes as chest pain.  On closer questioning it is a combination of fluttering in her chest anteriorly with some chest discomfort.  Symptoms typically last approximately 5 minutes and are not associated with exertion.  They can occur at work, she works as a Scientist, water quality in [employer]. She also describes hot flashes at night and night sweats.  She had a hysterectomy a long time ago but ovaries were not taken out I believe. She is a smoker, has hypertension, is obese and also has hyperlipidemia as well as being a prediabetic.  Past Medical History:  Diagnosis Date  . Allergy    seasonal  . Anxiety   . Arthritis    deterioration of spine, knees  . Carpal tunnel syndrome of right wrist   . Clotting disorder (HCC)    antiphospholipid syndrome  . Depression       Family History  Problem Relation Age of Onset  . Cancer Mother        breast   . Arthritis Mother   . Hypertension Mother   . Thyroid disease Father   . Mental illness Sister        depression ? bipolar  . Dementia Maternal Grandmother   . Cancer Maternal Grandfather   . Heart disease Paternal Grandmother   . Cancer Paternal Grandfather     Social History   Social History Narrative   Associates degree in Psychologist, sport and exercise. 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 Has 2 children 1 son and 1 daughter. Married x 15 years.   Social History   Tobacco Use  . Smoking status: Current Every Day Smoker    Packs/day: 1.00   Years: 20.00    Pack years: 20.00    Types: Cigarettes  . Smokeless tobacco: Never Used  Substance Use Topics  . Alcohol use: No    Current Meds  Medication Sig  . clonazePAM (KLONOPIN) 1 MG tablet Take 1 mg by mouth daily as needed for anxiety.  . DULoxetine (CYMBALTA) 60 MG capsule Take 60 mg by mouth daily.  . hydrOXYzine (VISTARIL) 50 MG capsule Take 50 mg by mouth daily as needed for anxiety.   Marland Kitchen losartan (COZAAR) 25 MG tablet Take 1 tablet (25 mg total) by mouth daily.  . meloxicam (MOBIC) 15 MG tablet Take 1 tablet (15 mg total) by mouth daily. TAKE WITH FOOD     Objective:   Today's Vitals: BP 120/80 (BP Location: Left Arm, Patient Position: Sitting, Cuff Size: Normal)   Pulse 84   Temp 97.7 F (36.5 C) (Temporal)   Ht 5\' 7"  (1.702 m)   Wt 264 lb (119.7 kg)   SpO2 97%   BMI 41.35 kg/m  Vitals with BMI 11/11/2019 10/28/2019 10/25/2019  Height 5\' 7"  5\' 7"  -  Weight 264 lbs 241 lbs 13 oz -  BMI [id number] -  Systolic 123456 [id number]  Diastolic 80 88 87  Pulse 84 87 -  Physical Exam   She does not appear to have pain at the present time.  Blood pressure is much better controlled than it was last time.  She is alert and orientated without any obvious focal neurological signs.    Assessment   1. Essential hypertension, benign   2. Prediabetes   3. Mixed hyperlipidemia   4. Hot flashes   5. Chest pain, unspecified type       Tests ordered Orders Placed This Encounter  Procedures  . Cardio IQ Adv Lipid and Inflamm Pnl  . Luteinizing hormone  . Follicle stimulating hormone  . Estradiol  . Progesterone  . Ambulatory referral to Cardiology     Plan: 1. I will check a cardio IQ lipid panel. 2. We discussed smoking cessation. 3. Blood work is ordered to see if she is in the perimenopausal period. 4. I will send her to cardiology as soon as possible for further evaluation of her symptoms. 5. Follow-up as scheduled.   No orders of the defined types were  placed in this encounter.   Doree Albee, MD

## 2019-11-15 LAB — PROGESTERONE: Progesterone: <0.5 ng/mL

## 2019-11-15 LAB — LUTEINIZING HORMONE: LH: 16.9 m[IU]/mL

## 2019-11-15 LAB — CARDIO IQ ADV LIPID AND INFLAMM PNL
Apolipoprotein B: 124 mg/dL — ABNORMAL HIGH (ref <90)
Cholesterol: 226 mg/dL — ABNORMAL HIGH (ref <200)
HDL: 38 mg/dL — ABNORMAL LOW (ref >49)
LDL Cholesterol (Calc): 152 mg/dL (calc) — ABNORMAL HIGH (ref <100)
LDL Large: 4725 nmol/L — ABNORMAL LOW (ref >6729)
LDL Medium: 458 nmol/L — ABNORMAL HIGH (ref <215)
LDL Particle Number: 1874 nmol/L — ABNORMAL HIGH (ref <1138)
LDL Peak Size: 216.8 Angstrom — ABNORMAL LOW (ref >222.9)
LDL Small: 398 nmol/L — ABNORMAL HIGH (ref <142)
Lipoprotein (a): <10 nmol/L (ref <75)
Non-HDL Cholesterol (Calc): 188 mg/dL (calc) — ABNORMAL HIGH (ref <130)
PLAC: 116 nmol/min/mL (ref <124)
Total CHOL/HDL Ratio: 5.9 calc — ABNORMAL HIGH (ref <3.6)
Triglycerides: 222 mg/dL — ABNORMAL HIGH (ref <150)
hs-CRP: 4.5 mg/L — ABNORMAL HIGH (ref <1.0)

## 2019-11-15 LAB — ESTRADIOL: Estradiol: 176 pg/mL

## 2019-11-15 LAB — FOLLICLE STIMULATING HORMONE: FSH: 10 m[IU]/mL

## 2019-12-02 ENCOUNTER — Ambulatory Visit (INDEPENDENT_AMBULATORY_CARE_PROVIDER_SITE_OTHER): Payer: Managed Care, Other (non HMO) | Admitting: Internal Medicine

## 2019-12-09 ENCOUNTER — Ambulatory Visit: Payer: Managed Care, Other (non HMO) | Admitting: Cardiovascular Disease

## 2020-02-10 ENCOUNTER — Encounter (INDEPENDENT_AMBULATORY_CARE_PROVIDER_SITE_OTHER): Payer: Self-pay | Admitting: Nurse Practitioner

## 2020-02-10 ENCOUNTER — Ambulatory Visit (INDEPENDENT_AMBULATORY_CARE_PROVIDER_SITE_OTHER): Payer: Managed Care, Other (non HMO) | Admitting: Nurse Practitioner

## 2020-02-10 ENCOUNTER — Other Ambulatory Visit: Payer: Self-pay

## 2020-02-10 VITALS — BP 115/85 | HR 110 | Temp 97.5°F | Ht 67.0 in | Wt 265.8 lb

## 2020-02-10 DIAGNOSIS — R2243 Localized swelling, mass and lump, lower limb, bilateral: Secondary | ICD-10-CM | POA: Diagnosis not present

## 2020-02-10 DIAGNOSIS — Z1329 Encounter for screening for other suspected endocrine disorder: Secondary | ICD-10-CM

## 2020-02-10 DIAGNOSIS — Z131 Encounter for screening for diabetes mellitus: Secondary | ICD-10-CM | POA: Diagnosis not present

## 2020-02-10 DIAGNOSIS — F3341 Major depressive disorder, recurrent, in partial remission: Secondary | ICD-10-CM

## 2020-02-10 DIAGNOSIS — I10 Essential (primary) hypertension: Secondary | ICD-10-CM

## 2020-02-10 HISTORY — DX: Essential (primary) hypertension: I10

## 2020-02-10 MED ORDER — FUROSEMIDE 20 MG PO TABS: 20.0000 mg | ORAL_TABLET | Freq: Every day | ORAL | 1 refills | Status: DC | PRN

## 2020-02-10 MED ORDER — POTASSIUM CHLORIDE CRYS ER 20 MEQ PO TBCR: 20.0000 meq | EXTENDED_RELEASE_TABLET | Freq: Every day | ORAL | 3 refills | Status: DC

## 2020-02-10 NOTE — Progress Notes (Signed)
Subjective:  Patient ID: Diana Jensen, female    DOB: 12-09-1975  Age: 44 y.o. MRN: [id number]  CC:  Chief Complaint  Patient presents with  . Hypertension  . Leg Swelling  . Follow-up      HPI  This patient comes in today for follow-up of her chronic conditions as well as concerns for leg swelling.  Bilateral lower extremity swelling: She tells me over the years she has been experiencing bilateral lower extremity swelling.  The swelling is worse in the afternoon evening, and seems to improve when she lays down and sleeps through the night.  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  She tells me that by the end of the evening the pain is a 10 out of 10 in her bilateral lower extremities and she describes it as a throbbing aching pain.  She denies any orthopnea, shortness of breath, or chest pain.  She has tried meloxicam, Tylenol, Advil, and ibuprofen without improvement in her symptoms.  She has tried compression stockings in the past but has not used them recently.  Hypertension: She continues on losartan for her hypertension.  She is tolerating this medication well.  Last anabolic panel did show mildly reduced potassium level.  Depression: She also continues on clonazepam, duloxetine, and hydroxyzine for her depression and anxiety.  She tells me she feels her mood is fairly stable, but she has been experiencing some mood swings and night sweats.  She is requesting referral to another psychiatrist because she would like to participate in talk therapy in addition to medication management.  She is wondering if she is perimenopausal.  She has a history of partial hysterectomy and does not have menstrual bleeding.  Per chart review I see back in May blood work was collected which did show estradiol level in the 170s, however her progesterone level was less than  0.5.   Past Medical History:  Diagnosis Date  . Allergy    seasonal  . Anxiety   . Arthritis    deterioration of spine, knees  . Carpal tunnel syndrome of right wrist   . Clotting disorder (HCC)    antiphospholipid syndrome  . Depression   . Essential hypertension, benign 02/10/2020      Family History  Problem Relation Age of Onset  . Cancer Mother        breast   . Arthritis Mother   . Hypertension Mother   . Thyroid disease Father   . Mental illness Sister        depression ? bipolar  . Dementia Maternal Grandmother   . Cancer Maternal Grandfather   . Heart disease Paternal Grandmother   . Cancer Paternal Grandfather     Social History   Social History Narrative   Associates degree in Psychologist, sport and exercise. 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 Has 2 children 1 son and 1 daughter. Married x 15 years.   Social History   Tobacco Use  . Smoking status: Current Every Day Smoker    Packs/day: 1.00    Years: 20.00    Pack years: 20.00    Types: Cigarettes  . Smokeless tobacco: Never Used  Substance Use Topics  . Alcohol use: No     Current Meds  Medication Sig  . clonazePAM (KLONOPIN) 1 MG tablet Take 1 mg  by mouth daily as needed for anxiety.  . DULoxetine (CYMBALTA) 60 MG capsule Take 60 mg by mouth daily.  . hydrOXYzine (VISTARIL) 50 MG capsule Take 50 mg by mouth daily as needed for anxiety.   Marland Kitchen losartan (COZAAR) 25 MG tablet Take 1 tablet (25 mg total) by mouth daily.  . meloxicam (MOBIC) 15 MG tablet Take 1 tablet (15 mg total) by mouth daily. TAKE WITH FOOD    ROS:  Review of Systems  Respiratory: Negative for shortness of breath.   Cardiovascular: Positive for leg swelling. Negative for chest pain and orthopnea.  Psychiatric/Behavioral: Positive for depression. Negative for suicidal ideas. The patient is not nervous/anxious.      Objective:   Today's Vitals: BP 115/85 (BP Location: Left Arm, Patient Position: Sitting, Cuff  Size: Normal)   Pulse (!) 110   Temp (!) 97.5 F (36.4 C) (Temporal)   Ht _0  (1.702 m)   Wt 265 lb 12.8 oz (120.6 kg)   SpO2 99%   BMI 41.63 kg/m  Vitals with BMI 02/10/2020 11/11/2019 10/28/2019  Height _1  _2  _3   Weight 265 lbs 13 oz 264 lbs 241 lbs 13 oz  BMI 41.62 37.85 88.50  Systolic 277 412 878  Diastolic 85 80 88  Pulse 676 84 87     Physical Exam Vitals reviewed.  Constitutional:      General: She is not in acute distress.    Appearance: Normal appearance.  HENT:     Head: Normocephalic and atraumatic.  Neck:     Vascular: No carotid bruit.  Cardiovascular:     Rate and Rhythm: Normal rate and regular rhythm.     Pulses: Normal pulses.     Heart sounds: Normal heart sounds.     Comments: (+) Varicose veins Pulmonary:     Effort: Pulmonary effort is normal.     Breath sounds: Normal breath sounds.  Musculoskeletal:     Right lower leg: Edema present.     Left lower leg: Edema present.  Skin:    General: Skin is warm and dry.  Neurological:     General: No focal deficit present.     Mental Status: She is alert and oriented to person, place, and time.  Psychiatric:        Mood and Affect: Mood normal.        Behavior: Behavior normal.        Judgment: Judgment normal.          Assessment and Plan   1. Localized swelling, mass, or lump of lower extremity, bilateral   2. Depression, major, recurrent, in partial remission ([place])   3. Screening for thyroid disorder   4. Screening for diabetes mellitus   5. Essential hypertension, benign      Plan: 1.  At this point I think her swelling is most likely dependent edema.  I recommended she use thigh-high compression stockings on a regular basis, and I will prescribe her an as needed daily furosemide with potassium supplement for swelling.  I did discuss that this can result in reduced blood pressure so she starts to feel dizzy, lightheaded, short of breath, fatigued when she takes this medicine  that she should let me know.  I did counsel her to only take her potassium supplement when she takes a furosemide.  I did recommend that she take the furosemide earlier in the day to prevent being up late at night urinating.  She tells me she understands.  She will follow-up in approximately 2 weeks to repeat CMP leve and for her annual exam.  2.  We will send referral for psychiatry in this area so she can experience medication management in addition to talk therapy.  She is probably perimenopausal which could be contributing to her mood swings.  She is not open or interested in trying bioidentical hormonal placement therapy at this time to treat her symptoms, but she was encouraged to let me know if she would like to try this in the future.  3.-4.  We will check blood work today in preparation for annual physical in 2 weeks.  5.  She will continue on her current medication regimen for her blood pressure, but as stated in plan 1 with addition of furosemide was told to monitor for signs of hypotension and to let us know if these occur.   Tests ordered Orders Placed This Encounter  Procedures  . CMP with eGFR([facility])  . Hemoglobin A1c  . TSH  . Ambulatory referral to Psychiatry      Meds ordered this encounter  Medications  . furosemide (LASIX) 20 MG tablet    Sig: Take 1 tablet (20 mg total) by mouth daily as needed for fluid or edema.    Dispense:  30 tablet    Refill:  1    Order Specific Question:   Supervising Provider    Answer:   Hurshel Party C [[id number]]  . potassium chloride SA (KLOR-CON) 20 MEQ tablet    Sig: Take 1 tablet (20 mEq total) by mouth daily.    Dispense:  30 tablet    Refill:  3    Order Specific Question:   Supervising Provider    Answer:   Doree Albee [[id number]]    Patient to follow-up in 2 weeks for annual physical exam.  Ailene Ards, NP

## 2020-02-11 LAB — COMPLETE METABOLIC PANEL WITH GFR
AG Ratio: 1.9 (calc) (ref 1.0–2.5)
ALT: 21 U/L (ref 6–29)
AST: 13 U/L (ref 10–30)
Albumin: 4.3 g/dL (ref 3.6–5.1)
Alkaline phosphatase (APISO): 80 U/L (ref 31–125)
BUN: 14 mg/dL (ref 7–25)
CO2: 27 mmol/L (ref 20–32)
Calcium: 9.5 mg/dL (ref 8.6–10.2)
Chloride: 103 mmol/L (ref 98–110)
Creat: 0.73 mg/dL (ref 0.50–1.10)
GFR, Est African American: 119 mL/min/{1.73_m2} (ref >=60)
GFR, Est Non African American: 102 mL/min/{1.73_m2} (ref >=60)
Globulin: 2.3 g/dL (calc) (ref 1.9–3.7)
Glucose, Bld: 129 mg/dL — ABNORMAL HIGH (ref 65–99)
Potassium: 4.1 mmol/L (ref 3.5–5.3)
Sodium: 140 mmol/L (ref 135–146)
Total Bilirubin: 0.6 mg/dL (ref 0.2–1.2)
Total Protein: 6.6 g/dL (ref 6.1–8.1)

## 2020-02-11 LAB — HEMOGLOBIN A1C
Hgb A1c MFr Bld: 5.6 % of total Hgb (ref <5.7)
Mean Plasma Glucose: 114 (calc)
eAG (mmol/L): 6.3 (calc)

## 2020-02-11 LAB — TSH: TSH: 2 mIU/L

## 2020-02-26 ENCOUNTER — Encounter (INDEPENDENT_AMBULATORY_CARE_PROVIDER_SITE_OTHER): Payer: Managed Care, Other (non HMO) | Admitting: Nurse Practitioner

## 2020-03-16 ENCOUNTER — Other Ambulatory Visit (INDEPENDENT_AMBULATORY_CARE_PROVIDER_SITE_OTHER): Payer: Self-pay | Admitting: Internal Medicine

## 2020-03-23 ENCOUNTER — Ambulatory Visit (INDEPENDENT_AMBULATORY_CARE_PROVIDER_SITE_OTHER): Payer: Managed Care, Other (non HMO) | Admitting: Nurse Practitioner

## 2020-03-23 ENCOUNTER — Other Ambulatory Visit: Payer: Self-pay

## 2020-03-23 ENCOUNTER — Encounter (INDEPENDENT_AMBULATORY_CARE_PROVIDER_SITE_OTHER): Payer: Self-pay | Admitting: Nurse Practitioner

## 2020-03-23 ENCOUNTER — Telehealth (INDEPENDENT_AMBULATORY_CARE_PROVIDER_SITE_OTHER): Payer: Self-pay | Admitting: Nurse Practitioner

## 2020-03-23 VITALS — BP 136/84 | HR 86 | Temp 97.5°F | Resp 18 | Ht 67.0 in | Wt 257.2 lb

## 2020-03-23 DIAGNOSIS — M5441 Lumbago with sciatica, right side: Secondary | ICD-10-CM

## 2020-03-23 DIAGNOSIS — Z0001 Encounter for general adult medical examination with abnormal findings: Secondary | ICD-10-CM | POA: Diagnosis not present

## 2020-03-23 DIAGNOSIS — G8929 Other chronic pain: Secondary | ICD-10-CM | POA: Diagnosis not present

## 2020-03-23 DIAGNOSIS — F3341 Major depressive disorder, recurrent, in partial remission: Secondary | ICD-10-CM

## 2020-03-23 DIAGNOSIS — Z1231 Encounter for screening mammogram for malignant neoplasm of breast: Secondary | ICD-10-CM | POA: Diagnosis not present

## 2020-03-23 NOTE — Telephone Encounter (Signed)
I have ordered a screening mammogram for this patient today. Please make sure it is run through insurance and scheduled.

## 2020-03-23 NOTE — Progress Notes (Signed)
Subjective:  Patient ID: Diana Jensen, female    DOB: [birth year]  Age: 44 y.o. MRN: [id number]  CC:  Chief Complaint  Patient presents with  . Annual Exam  . tdap vaccine  . Flu Vaccine  . Back Pain  . Depression      HPI  This patient arrives today for her annual physical exam.  She is due for tetanus and flu vaccines.  She has not yet had the COVID-19 vaccine, she does not plan on getting this at this time.  She has had a partial hysterectomy is not sure if her cervix was removed, but denies any history of abnormal Pap smears.  She tells me she believes she is due for screening mammogram.  She has had sex transmitted infection screening in the past and does not wish to repeat it today.  She would be due for hepatitis C screening but is not wanting to do blood work today unless necessary.  She also mentions to me that she has been experiencing some low back pain with numbness to her right great toe.  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  She tells me the pain is been ongoing for about 1 to 2 years.  It is stable in nature, but the numbness to her right great toe has started over the last couple of months.  She would be open to work on physical therapy.  She denies any new onset of urinary incontinence or fecal incontinence.  She also denies any saddle paresthesias.  She does have a history of depression and continues on Klonopin, duloxetine, and hydroxyzine.  Referral to psychiatry was ordered at her previous office visit, but she tells me she never heard from their office to schedule an appointment.  Past Medical History:  Diagnosis Date  . Allergy    seasonal  . Anxiety   . Arthritis    deterioration of spine, knees  . Carpal tunnel syndrome of right wrist   . Clotting disorder (HCC)    antiphospholipid syndrome  . Depression   . Essential hypertension, benign 02/10/2020      Family History  Problem Relation Age of Onset  .  Cancer Mother        breast   . Arthritis Mother   . Hypertension Mother   . Thyroid disease Father   . Mental illness Sister        depression ? bipolar  . Dementia Maternal Grandmother   . Cancer Maternal Grandfather   . Heart disease Paternal Grandmother   . Cancer Paternal Grandfather     Social History   Social History Narrative   Associates degree in Psychologist, sport and exercise. 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 Has 2 children 1 son and 1 daughter. Married x 15 years.   Social History   Tobacco Use  . Smoking status: Current Every Day Smoker    Packs/day: 1.00    Years: 20.00    Pack years: 20.00    Types: Cigarettes  . Smokeless tobacco: Never Used  Substance Use Topics  . Alcohol use: No     Current Meds  Medication Sig  . clonazePAM (KLONOPIN) 1 MG tablet Take 1 mg by mouth daily as needed for anxiety.  . DULoxetine (CYMBALTA) 60 MG capsule Take 60 mg by mouth daily.  . furosemide (LASIX) 20 MG tablet Take 1 tablet (20 mg total) by mouth daily as needed for fluid  or edema.  . hydrOXYzine (VISTARIL) 50 MG capsule Take 50 mg by mouth daily as needed for anxiety.   Marland Kitchen losartan (COZAAR) 25 MG tablet TAKE 1 TABLET BY MOUTH ONCE DAILY.  . meloxicam (MOBIC) 15 MG tablet Take 1 tablet (15 mg total) by mouth daily. TAKE WITH FOOD  . potassium chloride SA (KLOR-CON) 20 MEQ tablet Take 1 tablet (20 mEq total) by mouth daily.    ROS:  Negative unless otherwise stated in HPI   Objective:   Today's Vitals: BP 136/84 (BP Location: Right Arm, Patient Position: Sitting, Cuff Size: Normal)   Pulse 86   Temp (!) 97.5 F (36.4 C) (Temporal)   Resp 18   Ht 5\' 7"  (1.702 m)   Wt 257 lb 3.2 oz (116.7 kg)   SpO2 94% Comment: wearing mask.  BMI 40.28 kg/m  Vitals with BMI 03/23/2020 02/10/2020 11/11/2019  Height 5\' 7"  5\' 7"  5\' 7"   Weight 257 lbs 3 oz 265 lbs 13 oz 264 lbs  BMI 40.27 62.95 28.41  Systolic 324 401 027  Diastolic 84 85 80  Pulse 86 110 84       Physical Exam Vitals reviewed. Exam conducted with a chaperone present.  Constitutional:      Appearance: Normal appearance.  HENT:     Head: Normocephalic and atraumatic.     Right Ear: Tympanic membrane, ear canal and external ear normal.     Left Ear: Tympanic membrane, ear canal and external ear normal.  Eyes:     General:        Right eye: No discharge.        Left eye: No discharge.     Extraocular Movements: Extraocular movements intact.     Conjunctiva/sclera: Conjunctivae normal.     Pupils: Pupils are equal, round, and reactive to light.  Neck:     Vascular: No carotid bruit.  Cardiovascular:     Rate and Rhythm: Normal rate and regular rhythm.     Pulses: Normal pulses.     Heart sounds: Normal heart sounds. No murmur heard.   Pulmonary:     Effort: Pulmonary effort is normal.     Breath sounds: Normal breath sounds.  Chest:     Breasts: Breasts are symmetrical.        Right: Normal.        Left: Normal.  Abdominal:     General: Abdomen is flat. Bowel sounds are normal. There is no distension.     Palpations: Abdomen is soft. There is no mass.     Tenderness: There is no abdominal tenderness.  Musculoskeletal:        General: No tenderness.     Cervical back: Neck supple. No muscular tenderness.     Right lower leg: No edema.     Left lower leg: No edema.  Lymphadenopathy:     Cervical: No cervical adenopathy.     Upper Body:     Right upper body: No supraclavicular adenopathy.     Left upper body: No supraclavicular adenopathy.  Skin:    General: Skin is warm and dry.  Neurological:     General: No focal deficit present.     Mental Status: She is alert and oriented to person, place, and time.     Motor: No weakness.     Gait: Gait normal.  Psychiatric:        Mood and Affect: Mood normal.        Behavior: Behavior normal.  Judgment: Judgment normal.        Depression screen [facility] 2/9 03/23/2020 03/08/2017 08/21/2016  Decreased Interest 2 2  0  Down, Depressed, Hopeless 3 2 0  PHQ - 2 Score 5 4 0  Altered sleeping 2 1 -  Tired, decreased energy 3 2 -  Change in appetite 0 2 -  Feeling bad or failure about yourself  2 1 -  Trouble concentrating 1 1 -  Moving slowly or fidgety/restless 2 0 -  Suicidal thoughts 0 1 -  PHQ-9 Score 15 12 -     Assessment and Plan   1. Encounter for screening mammogram for malignant neoplasm of breast   2. Encounter for general adult medical examination with abnormal findings   3. Depression, major, recurrent, in partial remission ([place])   4. Chronic bilateral low back pain with right-sided sciatica      Plan: 1.,  2.  We will administer tetanus and flu shot today.  Will order screening mammogram to screen for breast cancer today.  Due to her history of partial hysterectomy I think her cervix is most likely removed, however I encouraged her to call the surgeons office to verify this.  If cervix was left even partially intact she should continue with Pap smears.  Tells me she understands we will try to find this out.  Will consider screening for hepatitis C next time blood work is drawn. 3.  Should continue on current medications and I will re-refer her to psychiatry. 4.  We will refer her to physical therapy for further evaluation and management of her low back pain.  For now I encouraged her to continue taking Mobic as needed and she can also take Tylenol for pain management.   Tests ordered Orders Placed This Encounter  Procedures  . MM Digital Screening  . Ambulatory referral to Psychiatry  . Ambulatory referral to Physical Therapy      No orders of the defined types were placed in this encounter.   Patient to follow-up in 2 months or sooner as needed.  In addition to performing her annual physical exam today also performed an office visit to address her concerns.  Ailene Ards, NP

## 2020-03-24 ENCOUNTER — Other Ambulatory Visit (HOSPITAL_COMMUNITY): Payer: Self-pay | Admitting: Nurse Practitioner

## 2020-03-24 DIAGNOSIS — Z1231 Encounter for screening mammogram for malignant neoplasm of breast: Secondary | ICD-10-CM

## 2020-03-24 NOTE — Telephone Encounter (Signed)
Order has to be changed and resigned.

## 2020-03-29 ENCOUNTER — Inpatient Hospital Stay (HOSPITAL_COMMUNITY): Admission: RE | Admit: 2020-03-29 | Payer: Managed Care, Other (non HMO) | Source: Ambulatory Visit

## 2020-03-31 ENCOUNTER — Inpatient Hospital Stay (HOSPITAL_COMMUNITY): Admission: RE | Admit: 2020-03-31 | Payer: Managed Care, Other (non HMO) | Source: Ambulatory Visit

## 2020-04-26 ENCOUNTER — Ambulatory Visit (INDEPENDENT_AMBULATORY_CARE_PROVIDER_SITE_OTHER): Payer: Managed Care, Other (non HMO) | Admitting: Orthopedic Surgery

## 2020-04-26 ENCOUNTER — Encounter: Payer: Self-pay | Admitting: Orthopedic Surgery

## 2020-04-26 ENCOUNTER — Other Ambulatory Visit: Payer: Self-pay

## 2020-04-26 VITALS — BP 147/93 | HR 77 | Ht 67.0 in | Wt 258.0 lb

## 2020-04-26 DIAGNOSIS — M5410 Radiculopathy, site unspecified: Secondary | ICD-10-CM

## 2020-04-26 DIAGNOSIS — Z9889 Other specified postprocedural states: Secondary | ICD-10-CM | POA: Diagnosis not present

## 2020-04-26 MED ORDER — PREDNISONE 10 MG (48) PO TBPK: ORAL_TABLET | Freq: Every day | ORAL | 0 refills | Status: DC

## 2020-04-26 MED ORDER — GABAPENTIN 100 MG PO CAPS: 100.0000 mg | ORAL_CAPSULE | Freq: Three times a day (TID) | ORAL | 2 refills | Status: DC

## 2020-04-26 NOTE — Patient Instructions (Signed)
Radicular Pain Radicular pain is a type of pain that spreads from your back or neck along a spinal nerve. Spinal nerves are nerves that leave the spinal cord and go to the muscles. Radicular pain is sometimes called radiculopathy, radiculitis, or a pinched nerve. When you have this type of pain, you may also have weakness, numbness, or tingling in the area of your body that is supplied by the nerve. The pain may feel sharp and burning. Depending on which spinal nerve is affected, the pain may occur in the:  Neck area (cervical radicular pain). You may also feel pain, numbness, weakness, or tingling in the arms.  Mid-spine area (thoracic radicular pain). You would feel this pain in the back and chest. This type is rare.  Lower back area (lumbar radicular pain). You would feel this pain as low back pain. You may feel pain, numbness, weakness, or tingling in the buttocks or legs. Sciatica is a type of lumbar radicular pain that shoots down the back of the leg. Radicular pain occurs when one of the spinal nerves becomes irritated or squeezed (compressed). It is often caused by something pushing on a spinal nerve, such as one of the bones of the spine (vertebrae) or one of the round cushions between vertebrae (intervertebral disks). This can result from:  An injury.  Wear and tear or aging of a disk.  The growth of a bone spur that pushes on the nerve. Radicular pain often goes away when you follow instructions from your health care provider for relieving pain at home. Follow these instructions at home: Managing pain      If directed, put ice on the affected area: ? Put ice in a plastic bag. ? Place a towel between your skin and the bag. ? Leave the ice on for 20 minutes, 2-3 times a day.  If directed, apply heat to the affected area as often as told by your health care provider. Use the heat source that your health care provider recommends, such as a moist heat pack or a heating pad. ? Place  a towel between your skin and the heat source. ? Leave the heat on for 20-30 minutes. ? Remove the heat if your skin turns bright red. This is especially important if you are unable to feel pain, heat, or cold. You may have a greater risk of getting burned. Activity   Do not sit or rest in bed for long periods of time.  Try to stay as active as possible. Ask your health care provider what type of exercise or activity is best for you.  Avoid activities that make your pain worse, such as bending and lifting.  Do not lift anything that is heavier than 10 lb (4.5 kg), or the limit that you are told, until your health care provider says that it is safe.  Practice using proper technique when lifting items. Proper lifting technique involves bending your knees and rising up.  Do strength and range-of-motion exercises only as told by your health care provider or physical therapist. General instructions  Take over-the-counter and prescription medicines only as told by your health care provider.  Pay attention to any changes in your symptoms.  Keep all follow-up visits as told by your health care provider. This is important. ? Your health care provider may send you to a physical therapist to help with this pain. Contact a health care provider if:  Your pain and other symptoms get worse.  Your pain medicine is not   helping.  Your pain has not improved after a few weeks of home care.  You have a fever. Get help right away if:  You have severe pain, weakness, or numbness.  You have difficulty with bladder or bowel control. Summary  Radicular pain is a type of pain that spreads from your back or neck along a spinal nerve.  When you have radicular pain, you may also have weakness, numbness, or tingling in the area of your body that is supplied by the nerve.  The pain may feel sharp or burning.  Radicular pain may be treated with ice, heat, medicines, or physical therapy. This  information is not intended to replace advice given to you by your health care provider. Make sure you discuss any questions you have with your health care provider. Document Revised: 12/25/2017 Document Reviewed: 12/25/2017 Elsevier Patient Education  2020 [employer].  

## 2020-04-26 NOTE — Progress Notes (Signed)
Chief Complaint  Patient presents with  . Hand Pain    having numbness   No Known Allergies  44 year old female status post carpal tunnel release on January 09, 2019 her carpal tunnel symptoms have resolved she is having no wound problems she is having some numbness in the ring finger and pain which radiates from there up into the shoulder and neck she had an MRI back in 2020 showed some bulging disks around C7-T1  And C6 and 7  C5-6: No significant disc bulge. No neural foraminal stenosis. No central canal stenosis.  C6-7: Mild broad-based disc bulge. No neural foraminal stenosis. No central canal stenosis.  C7-T1: No significant disc bulge. No neural foraminal stenosis. No central canal stenosis.  T1-2: Broad-based disc bulge. No foraminal or central canal stenosis.  IMPRESSION: 1. No significant cervical spine disc protrusion, foraminal stenosis or central canal stenosis. 2. Mild broad-based disc bulges at C6-7 and T1-2.   Electronically Signed   By: Kathreen Devoid   On: 08/20/2018 16:31   Examination of her hand shows an intact carpal tunnel incision normal sensation in the carpal tunnel distribution she has decreased sensation and pain ring finger running up the arm into the neck  Recommend medical management  I did look at her MRI and the report is listed above I do not see a definite nerve root compression so we will try medical management  Meds ordered this encounter  Medications  . gabapentin (NEURONTIN) 100 MG capsule    Sig: Take 1 capsule (100 mg total) by mouth 3 (three) times daily.    Dispense:  90 capsule    Refill:  2  . predniSONE (STERAPRED UNI-PAK 48 TAB) 10 MG (48) TBPK tablet    Sig: Take by mouth daily.    Dispense:  48 tablet    Refill:  0

## 2020-05-24 ENCOUNTER — Ambulatory Visit (INDEPENDENT_AMBULATORY_CARE_PROVIDER_SITE_OTHER): Payer: Managed Care, Other (non HMO) | Admitting: Nurse Practitioner

## 2020-06-07 ENCOUNTER — Ambulatory Visit: Payer: Managed Care, Other (non HMO) | Admitting: Orthopedic Surgery

## 2020-08-05 ENCOUNTER — Telehealth (INDEPENDENT_AMBULATORY_CARE_PROVIDER_SITE_OTHER): Payer: Managed Care, Other (non HMO) | Admitting: Nurse Practitioner

## 2020-08-05 ENCOUNTER — Encounter (INDEPENDENT_AMBULATORY_CARE_PROVIDER_SITE_OTHER): Payer: Self-pay | Admitting: Nurse Practitioner

## 2020-08-05 DIAGNOSIS — F3341 Major depressive disorder, recurrent, in partial remission: Secondary | ICD-10-CM

## 2020-08-05 DIAGNOSIS — M5441 Lumbago with sciatica, right side: Secondary | ICD-10-CM

## 2020-08-05 DIAGNOSIS — D6861 Antiphospholipid syndrome: Secondary | ICD-10-CM

## 2020-08-05 DIAGNOSIS — E785 Hyperlipidemia, unspecified: Secondary | ICD-10-CM

## 2020-08-05 DIAGNOSIS — I10 Essential (primary) hypertension: Secondary | ICD-10-CM

## 2020-08-05 DIAGNOSIS — G8929 Other chronic pain: Secondary | ICD-10-CM

## 2020-08-05 NOTE — Progress Notes (Signed)
An audio-only tele-health visit was conducted today. I connected with  Diana Jensen on 08/05/20 utilizing audio-only technology and verified that I am speaking with the correct person using two identifiers. The patient was located at [facility], and I was located at the office of [place] during the encounter. I discussed the limitations of evaluation and management by telemedicine. The patient expressed understanding and agreed to proceed.   Subjective:  Patient ID: Diana Jensen, female    DOB: 1976/03/30  Age: 45 y.o. MRN: [id number]  CC:  Chief Complaint  Patient presents with  . Back Pain  . Hyperlipidemia  . Hypertension  . Anxiety  . Depression  . Other    Antiphospholipid syndrome      HPI  This patient arrives today for a virtual visit for the above.  Back pain: She has been spearing seeing low back pain for approximately 1 to 2 years.  She tells me the pain has been getting progressively worse, last time I saw her and discussed this was approximately 5 months ago.  At that time we discussed working with physical therapy and she did go to work with physical therapy a few times but has not been able to continue to afford it.  She is also experiencing numbness in her groin and sometimes down to her feet.  She has tried gabapentin, Mobic, and has seen physical therapy as stated above.  She also is experiencing numbness to the ring finger on her right hand which is been going on since 2020.  She has had an MRI of the cervical spine which did show bulging disc on C6-C7 and T1-T2.  Strength can sometimes come and go she tells me when trying to pick up small objects she will has to focus and concentrate on gripping the object otherwise it seems to slip out of her hands.  Over the last week she has been experiencing some numbness to her left hand as well.  Hyperlipidemia: Last LDL was 152 this was collected back in May.  She is not on any medication currently for the  treatment of her hyperlipidemia.  Hypertension: She continues on losartan, and tells me she takes this inconsistently but tries to remember to take it as much as possible.  She does monitor her blood pressure routinely at home and tells me her blood pressure appears to be well controlled as long as she remembers to take her losartan.  Anxiety/depression: She continues on her anxiety and depression medication.  She is tolerating medication well and tells me that her mood is stable and she denies any suicidal ideation today.  Antiphospholipid syndrome: She was diagnosed with antiphospholipid syndrome approximately 20 years ago.  She is wondering if her headaches and neurologic symptoms could be related to this syndrome and would like to be evaluated by specialist for further information.  Past Medical History:  Diagnosis Date  . Allergy    seasonal  . Anxiety   . Arthritis    deterioration of spine, knees  . Carpal tunnel syndrome of right wrist   . Clotting disorder (HCC)    antiphospholipid syndrome  . Depression   . Essential hypertension, benign 02/10/2020      Family History  Problem Relation Age of Onset  . Cancer Mother        breast   . Arthritis Mother   . Hypertension Mother   . Thyroid disease Father   . Mental illness Sister  depression ? bipolar  . Dementia Maternal Grandmother   . Cancer Maternal Grandfather   . Heart disease Paternal Grandmother   . Cancer Paternal Grandfather     Social History   Social History Narrative   Associates degree in Psychologist, sport and exercise. 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 Has 2 children 1 son and 1 daughter. Married x 15 years.   Social History   Tobacco Use  . Smoking status: Current Every Day Smoker    Packs/day: 1.00    Years: 20.00    Pack years: 20.00    Types: Cigarettes  . Smokeless tobacco: Never Used  Substance Use Topics  . Alcohol use: No     Current Meds  Medication Sig  .  clonazePAM (KLONOPIN) 1 MG tablet Take 1 mg by mouth daily as needed for anxiety.  . DULoxetine (CYMBALTA) 60 MG capsule Take 60 mg by mouth daily.  . furosemide (LASIX) 20 MG tablet Take 1 tablet (20 mg total) by mouth daily as needed for fluid or edema.  . gabapentin (NEURONTIN) 100 MG capsule Take 1 capsule (100 mg total) by mouth 3 (three) times daily.  . hydrOXYzine (VISTARIL) 50 MG capsule Take 50 mg by mouth daily as needed for anxiety.   Marland Kitchen losartan (COZAAR) 25 MG tablet TAKE 1 TABLET BY MOUTH ONCE DAILY.  . meloxicam (MOBIC) 15 MG tablet Take 1 tablet (15 mg total) by mouth daily. TAKE WITH FOOD  . potassium chloride SA (KLOR-CON) 20 MEQ tablet Take 1 tablet (20 mEq total) by mouth daily.    ROS:  Review of Systems  Constitutional: Negative for diaphoresis and fever.  Eyes: Negative for blurred vision.  Respiratory: Negative for shortness of breath.   Cardiovascular: Negative for chest pain.  Musculoskeletal: Positive for back pain.  Neurological: Positive for sensory change and headaches. Negative for dizziness and loss of consciousness.  Psychiatric/Behavioral: Negative for suicidal ideas.     Objective:   Today's Vitals: There were no vitals taken for this visit. Vitals with BMI 04/26/2020 03/23/2020 02/10/2020  Height 5\' 7"  5\' 7"  5\' 7"   Weight 258 lbs 257 lbs 3 oz 265 lbs 13 oz  BMI 40.4 32.95 18.84  Systolic 166 063 016  Diastolic 93 84 85  Pulse 77 86 110     Physical Exam Comprehensive physical exam not completed today as office visit was conducted remotely.  Patient sounded well over the phone.  Patient was alert and oriented, and appeared to have appropriate judgment.    Assessment and Plan   1. Chronic bilateral low back pain with right-sided sciatica   2. Antiphospholipid syndrome ([place])   3. Depression, major, recurrent, in partial remission ([place])   4. Essential hypertension, benign   5. Hyperlipidemia, unspecified hyperlipidemia type      Plan: 1.   At this point I am to refer her to neurology for further evaluation and management of her back pain, neck pain and paresthesias associated with this. 2.  We will refer her to rheumatology for further assistance with evaluation of her antiphospholipid syndrome. 3.  She will continue on her medications as prescribed for depression. 4.  She will continue on her antihypertensives as prescribed. 5.  We will continue to monitor closely and consider checking lipid panel next office visit.   Tests ordered Orders Placed This Encounter  Procedures  . Ambulatory referral to Neurology  . Ambulatory referral to Rheumatology      No orders of the defined types  were placed in this encounter.   Patient to follow-up in 3 to 6 months or sooner as needed.  Total time spent on the telephone today was 17 minutes and 9 seconds.  Ailene Ards, NP

## 2020-09-22 ENCOUNTER — Other Ambulatory Visit: Payer: Self-pay

## 2020-09-22 ENCOUNTER — Encounter: Payer: Self-pay | Admitting: Internal Medicine

## 2020-09-22 ENCOUNTER — Ambulatory Visit: Payer: Managed Care, Other (non HMO) | Admitting: Internal Medicine

## 2020-09-22 VITALS — BP 131/88 | HR 108 | Ht 66.0 in | Wt 258.0 lb

## 2020-09-22 DIAGNOSIS — E559 Vitamin D deficiency, unspecified: Secondary | ICD-10-CM | POA: Insufficient documentation

## 2020-09-22 DIAGNOSIS — D6861 Antiphospholipid syndrome: Secondary | ICD-10-CM

## 2020-09-22 DIAGNOSIS — R200 Anesthesia of skin: Secondary | ICD-10-CM

## 2020-09-22 DIAGNOSIS — M25572 Pain in left ankle and joints of left foot: Secondary | ICD-10-CM

## 2020-09-22 DIAGNOSIS — R202 Paresthesia of skin: Secondary | ICD-10-CM | POA: Insufficient documentation

## 2020-09-22 NOTE — Patient Instructions (Signed)
Antinuclear Antibody Test Why am I having this test? This is a test that is used to help diagnose systemic lupus erythematosus (SLE) and other autoimmune diseases. An autoimmune disease is a disease in which the body's own defense (immune)system attacks its organs. What is being tested? This test checks for antinuclear antibodies (ANA) in the blood. The presence of ANA is associated with several autoimmune diseases. It is seen in almost all patients with lupus. What kind of sample is taken? A blood sample is required for this test. It is usually collected by inserting a needle into a blood vessel.   How are the results reported? Your test results will be reported as either positive or negative. A false-positive result can occur. A false positive is incorrect because it means that a condition is present when it is not. What do the results mean? A positive test result may mean that you have:  Lupus.  Other autoimmune diseases, such as rheumatoid arthritis, scleroderma, or Sjgren syndrome. Conditions that may cause a false-positive result include:  Liver dysfunction.  Myasthenia gravis.  Infectious mononucleosis. Talk with your health care provider about what your results mean. Questions to ask your health care provider Ask your health care provider, or the department that is doing the test:  When will my results be ready?  How will I get my results?  What are my treatment options?  What other tests do I need?  What are my next steps? Summary  This is a test that is used to help diagnose systemic lupus erythematosus (SLE) and other autoimmune diseases. An autoimmune disease is a disease in which the body's own defense (immune)system attacks the body.  This test checks for antinuclear antibodies (ANA) in the blood. The presence of ANA is associated with several autoimmune diseases. It is seen in almost all patients with lupus.  Your test results will be reported as either  positive or negative. Talk with your health care provider about what your results mean.    Complement Assay Test Why am I having this test? Complement refers to a group of proteins that are part of the body's disease-fighting system (immune system). A complement assay test provides information about some or all of these proteins. You may have this test:  To diagnose a lack, or deficiency, of certain complement proteins. Deficiencies can be passed from parent to child (inherited).  To monitor an infection or autoimmune disease.  If you have unexplained inflammation or swelling (edema).  If you have bacterial infections again and again. What is being tested? This test can be used to measure:  Total complement. This is the total number of protein complements in your blood.  The number of each kind of complement in your blood. The nine main kinds of complement are labeled C1 through C9. Some of these complements, such as C3 and C4, are especially important and have many functions in the body. Depending on why you are having the test, your health care provider may test your total complement or only some individual complements, such as C3 and C4. The total complement assay test may be done before individual complements are tested. What kind of sample is taken? A blood sample is required for this test. It is usually collected by inserting a needle into a blood vessel.   Tell a health care provider about:  Any allergies you have.  All medicines you are taking, including vitamins, herbs, eye drops, creams, and over-the-counter medicines.  Any blood disorders you have.  Any surgeries you have had.  Any medical conditions you have.  Whether you are pregnant or may be pregnant. How are the results reported? Your results will be reported as a value that tells you how much complement is in your blood. This will be given as units per milliliter of blood (units/mL) or as milligrams per  deciliter of blood (mg/dL). Your results may be reported as total complement, or as individual complements, or both. Your health care provider will compare your results to normal ranges that were established after testing a large group of people (reference ranges). Reference ranges may vary among labs and hospitals. For this test, reference ranges for some of the most commonly measured complement assays may be:  Total complement: 30-75 units/mL.  C2: 1-4 mg/dL.  C3: 75-175 mg/dL.  C4: 22-45 units/mL. What do the results mean? Results within reference ranges are considered normal, which means you have a normal amount of complement in your blood. Results that are higher than the reference ranges may be caused by:  Inflammatory disease.  Heart attack.  Cancer. Complement deficiencies, or results lower than the reference ranges, may be caused by:  Certain inherited conditions.  Autoimmune disease.  Certain liver diseases.  Malnutrition.  Certain types of anemia that result in breakdown of red blood cells (hemolytic anemia). Talk with your health care provider about what your results mean. Questions to ask your health care provider Ask your health care provider, or the department that is doing the test:  When will my results be ready?  How will I get my results?  What are my treatment options?  What other tests do I need?  What are my next steps? Summary  Complement refers to a group of proteins that are part of the body's disease-fighting system (immune system). A complement assay test can provide information about some or all of these proteins.  You may have a complement assay test to help diagnose a complement deficiency, and to monitor some infections or autoimmune disease.  Talk with your health care provider about what your results mean.    Vitamin D Test Why am I having this test? Vitamin D is a vitamin that your body makes when you are exposed to sunlight. It  is also found naturally in fish and eggs, and it is added to some foods, such as cereals and dairy products. You need vitamin D to maintain bone health and to support your disease-fighting (immune) system. You may have a vitamin D test:  To help diagnose osteoporosis or other bone disorders.  To monitor treatment of osteoporosis or other bone disorders.  If you have symptoms of a lack (deficiency) of vitamin D, such as experiencing broken bones from minor injuries.  If you lack certain nutrients in your diet (dietary deficiencies).  If you have problems absorbing nutrients during digestion. What is being tested? There are two types of vitamin D tests that measure slightly different things:  The 25-hydroxyvitamin D test measures how much of a substance called 25-hydroxyvitamin D is in your blood. The body converts vitamin D to 25-hydroxyvitamin D in the liver. Measuring how much 25-hydroxyvitamin D is in your blood provides a good idea of your vitamin D levels. This is the most common type of vitamin D test.  The 1,25-dihydroxyvitamin D test measures how much of a substance called 1,25-dihydroxyvitamin D is in your blood. The body converts vitamin D into this substance and then uses it for various functions. This test provides an idea  of your total vitamin D levels. What kind of sample is taken? A blood sample is required for this test. It is usually collected by inserting a needle into a blood vessel or by sticking a finger with a small needle.   Tell a health care provider about:  Any allergies you have.  All medicines you are taking, including vitamins, herbs, eye drops, creams, and over-the-counter medicines.  Any blood disorders you have.  Any surgeries you have had.  Any medical conditions you have.  Whether you are pregnant or may be pregnant. How are the results reported? Your test results will be reported as a value that tells you how much vitamin D is in your blood. Your  health care provider will compare your results to normal ranges that were established after testing a large group of people (reference ranges). Reference ranges may vary among labs and hospitals. For vitamin D tests, common reference ranges are:  25-hydroxyvitamin D: 25-80 ng/mL.  1,25-dihydroxyvitamin D: ? Males: 18-64 pg/mL. ? Females: 18-78 pg/mL. What do the results mean? If your result is within your reference range, this means that you have a normal amount of vitamin D in your blood. If your result is lower than your reference range, this means that you have too little vitamin D in your body.  If you had the 25-hydroxyvitamin D test specifically: ? A result of 21-29 ng/mL means that you have slightly lower levels of vitamin D than normal (vitamin D insufficiency). ? A result of 20 ng/mL or less means that you have very low levels of vitamin D (vitamin D deficiency).  Low levels of vitamin D can indicate: ? Not having enough exposure to sunlight. ? Not eating enough foods that contain vitamin D. ? Osteoporosis. ? Kidney disease. ? Liver disease. ? A bone disease that makes the bones weak, soft, or poorly developed (rickets). ? Softening of the bones (osteomalacia). ? The body not absorbing vitamin D normally (gastrointestinal malabsorption). If your result is higher than your reference range, this means that you have too much vitamin D in your body. This can result from:  Taking too many dietary supplements.  Hyperparathyroidism. This is a rare condition in which you do not make enough of a certain type of hormone (parathyroid hormone or PTH).  A disease that causes inflammation in your organs and other areas of your body (sarcoidosis).  A rare developmental disorder that is present at birth Jimmye Norman syndrome). Talk with your health care provider about what your results mean. Questions to ask your health care provider Ask your health care provider, or the department that is  doing the test:  When will my results be ready?  How will I get my results?  What are my treatment options?  What other tests do I need?  What are my next steps? Summary  You may have a vitamin D test to determine if your body has enough vitamin D. You need vitamin D to maintain bone health and to support your disease-fighting (immune) system.  Your vitamin D level is determined with a blood test.  Talk with your health care provider about what your results mean. This information is not intended to replace advice given to you by your health care provider. Make sure you discuss any questions you have with your health care provider. Document Revised: 03/25/2020 Document Reviewed: 03/25/2020 Elsevier Patient Education  [place].

## 2020-09-22 NOTE — Progress Notes (Signed)
Office Visit Note  Patient: Diana Jensen             Date of Birth: 14-Dec-1975           MRN: [id number]             PCP: Doree Albee, MD Referring: Ailene Ards, NP Visit Date: 09/22/2020   Subjective:  New Patient (Initial Visit) (Patient complains of intermittent numbness and tingling in the bottom jaw, bilateral arms, bilateral legs/feet. )   History of Present Illness: Diana Jensen is a 45 y.o. female with history of HTN, low back pain here for evaluation of her history of antiphospholipid antibody syndrome. This was diagnosed based on 3 consecutive miscarriages but was later able to have 2 children on lovenox and heparin anticoagulation. She is not on any particular treatment for this outside of pregnancy and denies any known VTE events. She has some chronic arthritis in her lower extremities and previous right ankle injury. But more recently she is noticing areas of numbness affecting her lower jaw, both arms, and both distal legs and feet. She has constant numbness on the right 4th finger, she had previous carpal tunnel release surgery with good improvement of CTS symptoms in her hand previously. She does not describe raynaud's symptoms. She has chronic headaches and neck pain and cervical spine MRI in 2020 was not very remarkable for specific causes.  Activities of Daily Living:  Patient reports morning stiffness for 24 hours.   Patient Reports nocturnal pain.  Difficulty dressing/grooming: Reports Difficulty climbing stairs: Reports Difficulty getting out of chair: Reports Difficulty using hands for taps, buttons, cutlery, and/or writing: Reports  Review of Systems  Constitutional: Positive for fatigue.  HENT: Positive for mouth dryness. Negative for mouth sores and nose dryness.   Eyes: Negative for pain, itching, visual disturbance and dryness.  Respiratory: Negative for cough, hemoptysis, shortness of breath and difficulty breathing.   Cardiovascular: Positive for  palpitations and swelling in legs/feet. Negative for chest pain.  Gastrointestinal: Positive for diarrhea. Negative for abdominal pain, blood in stool and constipation.  Endocrine: Negative for increased urination.  Genitourinary: Negative for painful urination.  Musculoskeletal: Positive for arthralgias, joint pain, joint swelling, myalgias, muscle weakness, morning stiffness, muscle tenderness and myalgias.  Skin: Positive for color change. Negative for rash and redness.  Allergic/Immunologic: Negative for susceptible to infections.  Neurological: Positive for numbness, headaches and weakness. Negative for dizziness and memory loss.  Hematological: Negative for swollen glands.  Psychiatric/Behavioral: Positive for sleep disturbance. Negative for confusion.    PMFS History:  Patient Active Problem List   Diagnosis Date Noted  . Left lateral ankle pain 09/22/2020  . Numbness and tingling 09/22/2020  . Vitamin D deficiency 09/22/2020  . Localized swelling, mass, or lump of lower extremity, bilateral 02/10/2020  . Essential hypertension, benign 02/10/2020  . S/P carpal tunnel release right 01/09/2019 01/14/2019  . Carpal tunnel syndrome of right wrist   . 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 Antiphospholipid syndrome ([place]) 02/22/2016  . Tobacco abuse 02/22/2016  . Family history of breast cancer 02/22/2016  . Lumbago 02/22/2016  . Sleep disorder 02/22/2016  . Depression, major, recurrent, in partial remission ([place]) 02/22/2016    Past Medical History:  Diagnosis Date  . Allergy    seasonal  . Anxiety   . Arthritis    deterioration of spine, knees  . Carpal tunnel syndrome of right wrist   . Clotting disorder (HCC)    antiphospholipid  syndrome  . Depression   . Essential hypertension, benign 02/10/2020    Family History  Problem Relation Age of Onset  . Cancer Mother        breast   . Arthritis Mother   . Hypertension Mother   . Osteoporosis Mother   . Thyroid  disease Father   . Hypertension Father   . Mental illness Sister        depression ? bipolar  . Mental illness Daughter   . Dementia Maternal Grandmother   . Cancer Maternal Grandfather   . Heart disease Paternal Grandmother   . Cancer Paternal Grandfather    Past Surgical History:  Procedure Laterality Date  . ABDOMINAL HYSTERECTOMY  2011   partial - bleeding  . CARPAL TUNNEL RELEASE Right 01/09/2019   Procedure: RIGHT CARPAL TUNNEL RELEASE;  Surgeon: Carole Civil, MD;  Location: [facility];  Service: Orthopedics;  Laterality: Right;  . CARPAL TUNNEL RELEASE Right 2020  . DILATION AND CURETTAGE OF UTERUS     x2  . EYE SURGERY     lazy eye   Social History   Social History Narrative   Associates degree in Psychologist, sport and exercise. 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 Has 2 children 1 son and 1 daughter. Married x 15 years.   Immunization History  Administered Date(s) Administered  . Influenza,inj,Quad PF,6+ Mos 02/22/2016  . Influenza-Unspecified 03/23/2020  . Tdap 03/23/2020     Objective: Vital Signs: BP 131/88 (BP Location: Right Arm, Patient Position: Sitting, Cuff Size: Normal)   Pulse (!) 108   Ht 5' 6"  (1.676 m)   Wt 258 lb (117 kg)   BMI 41.64 kg/m    Physical Exam Constitutional:      Appearance: She is obese.  HENT:     Right Ear: External ear normal.     Left Ear: External ear normal.     Mouth/Throat:     Mouth: Mucous membranes are moist.     Pharynx: Oropharynx is clear.  Eyes:     Conjunctiva/sclera: Conjunctivae normal.  Cardiovascular:     Rate and Rhythm: Regular rhythm. Tachycardia present.  Pulmonary:     Effort: Pulmonary effort is normal.     Breath sounds: Normal breath sounds.  Skin:    General: Skin is warm and dry.     Comments: Superficial varicose veins on legs  Neurological:     General: No focal deficit present.     Mental Status: She is alert.  Psychiatric:        Mood and Affect: Mood normal.      Musculoskeletal Exam:  Shoulders full ROM no tenderness or swelling Elbows full ROM no tenderness or swelling Wrists full ROM no tenderness or swelling Fingers full ROM no tenderness or swelling, describes some tenderness to pressure over right 4th MCP Knees no effusion palpble, severe crepitus in bilateral knees Ankles full ROM, left lateral and anterior tenderness to pressure and with forced inversion  MTPs full ROM no tenderness or swelling  Investigation: No additional findings.  Imaging: No results found.  Recent Labs: Lab Results  Component Value Date   WBC 8.5 10/25/2019   HGB 13.8 10/25/2019   PLT 280 10/25/2019   NA 140 02/10/2020   K 4.1 02/10/2020   CL 103 02/10/2020   CO2 27 02/10/2020   GLUCOSE 129 (H) 02/10/2020   BUN 14 02/10/2020   CREATININE 0.73 02/10/2020   BILITOT 0.6 02/10/2020   ALKPHOS 75 01/27/2018  AST 13 02/10/2020   ALT 21 02/10/2020   PROT 6.6 02/10/2020   ALBUMIN 4.4 01/27/2018   CALCIUM 9.5 02/10/2020   GFRAA 119 02/10/2020    Speciality Comments: No specialty comments available.  Procedures:  No procedures performed Allergies: Patient has no known allergies.   Assessment / Plan:     Visit Diagnoses: Antiphospholipid syndrome ([place]) - Plan: ANA, Beta-2 glycoprotein antibodies, Cardiolipin antibodies, IgG, IgM, IgA, Lupus Anticoagulant Eval w/Reflex, C3 and C4, Sedimentation rate  History of obstetric APS but without known other venous or arterial thrombotic disease. Scattered neurologic involvement from arterial thrombotic disease can be similar to MS or other condition although not extremely clear clinical case at this time so would not label extracriteria APS at this time. Repeating APS Ab titers, complements, and ESR. Can consider nerve testing or neuroimaging if symptoms persist or worsening.  Left lateral ankle pain  Localized pain, provoked with movement and use and no focal swelling seen does not look inflammatory process at  this time.  Numbness and tingling - Plan: Vitamin B12  Symptoms are scatted but distal predominant and sensory. Checking B12 level screening for vitamin related neuropathy.  Vitamin D deficiency - Plan: VITAMIN D 25 Hydroxy (Vit-D Deficiency, Fractures)  Also checking vitamin D clinically suspected condition and possible contributor may increase proteinuria or other complications.  Orders: Orders Placed This Encounter  Procedures  . ANA  . Beta-2 glycoprotein antibodies  . Cardiolipin antibodies, IgG, IgM, IgA  . Lupus Anticoagulant Eval w/Reflex  . C3 and C4  . Sedimentation rate  . VITAMIN D 25 Hydroxy (Vit-D Deficiency, Fractures)  . Vitamin B12   No orders of the defined types were placed in this encounter.    Follow-Up Instructions: No follow-ups on file.   Collier Salina, MD  Note - This record has been created using [employer].  Chart creation errors have been sought, but may not always  have been located. Such creation errors do not reflect on  the standard of medical care.

## 2020-09-24 LAB — VITAMIN B12: Vitamin B-12: 276 pg/mL (ref 200–1100)

## 2020-09-24 LAB — LUPUS ANTICOAGULANT EVAL W/ REFLEX
PTT-LA Screen: 37 s (ref <=40)
dRVVT: 40 s (ref <=45)

## 2020-09-24 LAB — VITAMIN D 25 HYDROXY (VIT D DEFICIENCY, FRACTURES): Vit D, 25-Hydroxy: 21 ng/mL — ABNORMAL LOW (ref 30–100)

## 2020-09-24 LAB — CARDIOLIPIN ANTIBODIES, IGG, IGM, IGA
Anticardiolipin IgA: 2.9 APL-U/mL
Anticardiolipin IgG: <2 GPL-U/mL
Anticardiolipin IgM: <2 MPL-U/mL

## 2020-09-24 LAB — C3 AND C4
C3 Complement: 170 mg/dL (ref 83–193)
C4 Complement: 44 mg/dL (ref 15–57)

## 2020-09-24 LAB — ANA: Anti Nuclear Antibody (ANA): POSITIVE — AB

## 2020-09-24 LAB — BETA-2 GLYCOPROTEIN ANTIBODIES
Beta-2 Glyco 1 IgA: <2 U/mL
Beta-2 Glyco 1 IgM: <2 U/mL
Beta-2 Glyco I IgG: <2 U/mL

## 2020-09-24 LAB — ANTI-NUCLEAR AB-TITER (ANA TITER): ANA Titer 1: 1:160 {titer} — ABNORMAL HIGH

## 2020-09-24 LAB — SEDIMENTATION RATE: Sed Rate: 9 mm/h (ref 0–20)

## 2020-09-27 ENCOUNTER — Telehealth: Payer: Self-pay | Admitting: Internal Medicine

## 2020-09-27 DIAGNOSIS — R200 Anesthesia of skin: Secondary | ICD-10-CM

## 2020-09-27 DIAGNOSIS — R202 Paresthesia of skin: Secondary | ICD-10-CM

## 2020-09-27 DIAGNOSIS — D6861 Antiphospholipid syndrome: Secondary | ICD-10-CM

## 2020-09-27 NOTE — Telephone Encounter (Signed)
Patient calling because she was told doctor would call her today with lab results, but she has to go into work now. Patient requests a call tomorrow, anytime.

## 2020-09-27 NOTE — Telephone Encounter (Signed)
Labs from 09/22/2020 resulted- please advise and I will contact patient.

## 2020-09-27 NOTE — Telephone Encounter (Signed)
Lab results do show the positive ANA but all of the more specific antibody tests are negative. Combined with our exam and history this makes lupus very unlikely. She is negative for antiphospholipid antibodies so does not need to be on blood thinners and this would not be the cause of her various sensation changes. I think evaluation with neurology would be the best to take a look at this and also address her questions about MS. I can place a referral if she is interested at this time. The vitamin D level is low, she needs to take a supplement of 1000 units daily or if already taking one to increase this amount.

## 2020-09-28 NOTE — Telephone Encounter (Signed)
Spoke with patient, advised lab results do show the positive ANA but all of the more specific antibody tests are negative. Combined with our exam and history this makes lupus very unlikely. She is negative for antiphospholipid antibodies so does not need to be on blood thinnersand this would not be the cause of her various sensation changes. Dr. Benjamine Mola thinks evaluation with neurology would be the best to take a look at this and also address her questions about MS. Patient would like a referral to neurology.  The vitamin D level is low, advised patient per Dr. Benjamine Mola to take 1,000 units daily.

## 2020-10-05 ENCOUNTER — Other Ambulatory Visit: Payer: Self-pay

## 2020-10-05 ENCOUNTER — Emergency Department (HOSPITAL_COMMUNITY)
Admission: EM | Admit: 2020-10-05 | Discharge: 2020-10-05 | Disposition: A | Discharge status: Home / Self Care | Payer: Managed Care, Other (non HMO) | Attending: Emergency Medicine | Admitting: Emergency Medicine

## 2020-10-05 ENCOUNTER — Encounter (HOSPITAL_COMMUNITY): Payer: Self-pay | Admitting: *Deleted

## 2020-10-05 DIAGNOSIS — M25572 Pain in left ankle and joints of left foot: Secondary | ICD-10-CM | POA: Diagnosis not present

## 2020-10-05 DIAGNOSIS — Z79899 Other long term (current) drug therapy: Secondary | ICD-10-CM | POA: Insufficient documentation

## 2020-10-05 DIAGNOSIS — M79672 Pain in left foot: Secondary | ICD-10-CM | POA: Insufficient documentation

## 2020-10-05 DIAGNOSIS — M25571 Pain in right ankle and joints of right foot: Secondary | ICD-10-CM | POA: Insufficient documentation

## 2020-10-05 DIAGNOSIS — I10 Essential (primary) hypertension: Secondary | ICD-10-CM | POA: Diagnosis not present

## 2020-10-05 DIAGNOSIS — M79671 Pain in right foot: Secondary | ICD-10-CM | POA: Insufficient documentation

## 2020-10-05 DIAGNOSIS — F17210 Nicotine dependence, cigarettes, uncomplicated: Secondary | ICD-10-CM | POA: Insufficient documentation

## 2020-10-05 LAB — COMPREHENSIVE METABOLIC PANEL
ALT: 25 U/L (ref 0–44)
AST: 15 U/L (ref 15–41)
Albumin: 4.2 g/dL (ref 3.5–5.0)
Alkaline Phosphatase: 81 U/L (ref 38–126)
Anion gap: 12 (ref 5–15)
BUN: 15 mg/dL (ref 6–20)
CO2: 26 mmol/L (ref 22–32)
Calcium: 9.2 mg/dL (ref 8.9–10.3)
Chloride: 105 mmol/L (ref 98–111)
Creatinine, Ser: 0.76 mg/dL (ref 0.44–1.00)
GFR, Estimated: >60 mL/min (ref >60)
Glucose, Bld: 93 mg/dL (ref 70–99)
Potassium: 4.3 mmol/L (ref 3.5–5.1)
Sodium: 143 mmol/L (ref 135–145)
Total Bilirubin: 0.7 mg/dL (ref 0.3–1.2)
Total Protein: 7.5 g/dL (ref 6.5–8.1)

## 2020-10-05 LAB — CBC WITH DIFFERENTIAL/PLATELET
Abs Immature Granulocytes: 0.05 10*3/uL (ref 0.00–0.07)
Basophils Absolute: 0.1 10*3/uL (ref 0.0–0.1)
Basophils Relative: 1 %
Eosinophils Absolute: 0.6 10*3/uL — ABNORMAL HIGH (ref 0.0–0.5)
Eosinophils Relative: 8 %
HCT: 44.7 % (ref 36.0–46.0)
Hemoglobin: 14.6 g/dL (ref 12.0–15.0)
Immature Granulocytes: 1 %
Lymphocytes Relative: 34 %
Lymphs Abs: 2.7 10*3/uL (ref 0.7–4.0)
MCH: 28.3 pg (ref 26.0–34.0)
MCHC: 32.7 g/dL (ref 30.0–36.0)
MCV: 86.8 fL (ref 80.0–100.0)
Monocytes Absolute: 0.5 10*3/uL (ref 0.1–1.0)
Monocytes Relative: 7 %
Neutro Abs: 4.1 10*3/uL (ref 1.7–7.7)
Neutrophils Relative %: 49 %
Platelets: 325 10*3/uL (ref 150–400)
RBC: 5.15 MIL/uL — ABNORMAL HIGH (ref 3.87–5.11)
RDW: 13.2 % (ref 11.5–15.5)
WBC: 8.1 10*3/uL (ref 4.0–10.5)
nRBC: 0 % (ref 0.0–0.2)

## 2020-10-05 MED ORDER — OXYCODONE-ACETAMINOPHEN 5-325 MG PO TABS: 2.0000 | ORAL_TABLET | Freq: Four times a day (QID) | ORAL | 0 refills | Status: DC | PRN

## 2020-10-05 MED ORDER — OXYCODONE-ACETAMINOPHEN 5-325 MG PO TABS
1.0000 | ORAL_TABLET | Freq: Once | ORAL | Status: AC
  Administered 2020-10-05: 1 via ORAL
  Filled 2020-10-05: qty 1

## 2020-10-05 NOTE — Discharge Instructions (Addendum)
Follow-up with the neurologist as planned

## 2020-10-05 NOTE — ED Triage Notes (Signed)
Pt c/o swelling to bilateral ankles, more on the right for "awhile" now. She reports what brought her here today was because she went to get up this morning and fell back on the bed because she couldn't put pressure on her feet because it hurt so bad. Pt reports usually her ankles swell during the day and when she props this up at night on pillows the next morning they aren't swollen as bad. She reports seeing several doctor recently for this problem who have told her she might have sciatica or neuropathy. She is waiting to see Neurologist in June to evaluate further for neuropathy but feels she can't wait that long to be seen.

## 2020-10-05 NOTE — ED Provider Notes (Signed)
[facility] EMERGENCY DEPARTMENT Provider Note   CSN: [id number] Arrival date & time: 10/05/20  0735     History Chief Complaint  Patient presents with  . Ankle Pain    Diana Jensen is a 45 y.o. female.  Patient complains of bilateral ankle and foot pain.  She has history of neuritis and is to see her neurologist  The history is provided by the patient and medical records. No language interpreter was used.  Ankle Pain Lower extremity pain location: Bilateral ankle. Pain details:    Quality:  Aching   Radiates to:  Does not radiate   Severity:  Mild   Timing:  Constant   Progression:  Waxing and waning Chronicity:  Recurrent Dislocation: no   Prior injury to area:  No Associated symptoms: no back pain and no fatigue        Past Medical History:  Diagnosis Date  . Allergy    seasonal  . Anxiety   . Arthritis    deterioration of spine, knees  . Carpal tunnel syndrome of right wrist   . Clotting disorder (HCC)    antiphospholipid syndrome  . Depression   . Essential hypertension, benign 02/10/2020    Patient Active Problem List   Diagnosis Date Noted  . Left lateral ankle pain 09/22/2020  . Numbness and tingling 09/22/2020  . Vitamin D deficiency 09/22/2020  . Localized swelling, mass, or lump of lower extremity, bilateral 02/10/2020  . Essential hypertension, benign 02/10/2020  . S/P carpal tunnel release right 01/09/2019 01/14/2019  . Carpal tunnel syndrome of right wrist   . 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 Antiphospholipid syndrome ([place]) 02/22/2016  . Tobacco abuse 02/22/2016  . Family history of breast cancer 02/22/2016  . Lumbago 02/22/2016  . Sleep disorder 02/22/2016  . Depression, major, recurrent, in partial remission ([place]) 02/22/2016    Past Surgical History:  Procedure Laterality Date  . ABDOMINAL HYSTERECTOMY  2011   partial - bleeding  . CARPAL TUNNEL RELEASE Right 01/09/2019   Procedure: RIGHT CARPAL TUNNEL RELEASE;   Surgeon: Carole Civil, MD;  Location: [facility];  Service: Orthopedics;  Laterality: Right;  . DILATION AND CURETTAGE OF UTERUS     x2  . EYE SURGERY     lazy eye     OB History   No obstetric history on file.     Family History  Problem Relation Age of Onset  . Cancer Mother        breast   . Arthritis Mother   . Hypertension Mother   . Osteoporosis Mother   . Thyroid disease Father   . Hypertension Father   . Mental illness Sister        depression ? bipolar  . Mental illness Daughter   . Dementia Maternal Grandmother   . Cancer Maternal Grandfather   . Heart disease Paternal Grandmother   . Cancer Paternal Grandfather     Social History   Tobacco Use  . Smoking status: Current Every Day Smoker    Packs/day: 0.50    Years: 20.00    Pack years: 10.00    Types: Cigarettes  . Smokeless tobacco: Never Used  Vaping Use  . Vaping Use: Never used  Substance Use Topics  . Alcohol use: No  . Drug use: No    Home Medications Prior to Admission medications   Medication Sig Start Date End Date Taking? Authorizing Provider  Cholecalciferol (D3-1000) 25 MCG (1000 UT) tablet Take 1,000 Units  by mouth daily.   Yes [provider]  furosemide (LASIX) 20 MG tablet Take 1 tablet (20 mg total) by mouth daily as needed for fluid or edema. 02/10/20  Yes Ailene Ards, NP  gabapentin (NEURONTIN) 100 MG capsule Take 1 capsule (100 mg total) by mouth 3 (three) times daily. 04/26/20  Yes Carole Civil, MD  hydrOXYzine (VISTARIL) 50 MG capsule Take 50 mg by mouth daily as needed for anxiety.  08/14/19  Yes [provider]  losartan (COZAAR) 25 MG tablet TAKE 1 TABLET BY MOUTH ONCE DAILY. Patient taking differently: Take 25 mg by mouth daily. 03/16/20  Yes Gosrani, Nimish C, MD  oxyCODONE-acetaminophen (PERCOCET) 5-325 MG tablet Take 2 tablets by mouth every 6 (six) hours as needed. 10/05/20  Yes Milton Ferguson, MD  potassium chloride SA (KLOR-CON) 20 MEQ tablet  Take 1 tablet (20 mEq total) by mouth daily. Patient taking differently: Take 20 mEq by mouth daily as needed (along with lasix). 02/10/20  Yes Ailene Ards, NP  meloxicam (MOBIC) 15 MG tablet Take 1 tablet (15 mg total) by mouth daily. TAKE WITH FOOD Patient not taking: Reported on 10/05/2020 09/17/17   Raylene Everts, MD    Allergies    Patient has no known allergies.  Review of Systems   Review of Systems  Constitutional: Negative for appetite change and fatigue.  HENT: Negative for congestion, ear discharge and sinus pressure.   Eyes: Negative for discharge.  Respiratory: Negative for cough.   Cardiovascular: Negative for chest pain.  Gastrointestinal: Negative for abdominal pain and diarrhea.  Genitourinary: Negative for frequency and hematuria.  Musculoskeletal: Negative for back pain.       Numbness and pain in ankles and feet  Skin: Negative for rash.  Neurological: Negative for seizures and headaches.  Psychiatric/Behavioral: Negative for hallucinations.    Physical Exam Updated Vital Signs BP (!) 147/94 (BP Location: Right Arm)   Pulse 85   Temp 97.6 F (36.4 C) (Oral)   Resp 18   Ht 5\' 6"  (1.676 m)   Wt 113.4 kg   SpO2 94%   BMI 40.35 kg/m   Physical Exam Vitals reviewed.  Constitutional:      Appearance: She is well-developed.  HENT:     Head: Normocephalic.     Nose: Nose normal.  Eyes:     General: No scleral icterus.    Conjunctiva/sclera: Conjunctivae normal.  Neck:     Thyroid: No thyromegaly.  Cardiovascular:     Rate and Rhythm: Normal rate and regular rhythm.     Heart sounds: No murmur heard. No friction rub. No gallop.   Pulmonary:     Breath sounds: No stridor. No wheezing or rales.  Chest:     Chest wall: No tenderness.  Abdominal:     General: There is no distension.     Tenderness: There is no abdominal tenderness. There is no rebound.  Musculoskeletal:     Cervical back: Neck supple.     Comments: Tenderness bilateral ankles  and feet otherwise negative  Lymphadenopathy:     Cervical: No cervical adenopathy.  Skin:    Findings: No erythema or rash.  Neurological:     Mental Status: She is alert and oriented to person, place, and time.     Motor: No abnormal muscle tone.     Coordination: Coordination normal.  Psychiatric:        Behavior: Behavior normal.     ED Results / Procedures /  Treatments   Labs (all labs ordered are listed, but only abnormal results are displayed) Labs Reviewed  CBC WITH DIFFERENTIAL/PLATELET - Abnormal; Notable for the following components:      Result Value   RBC 5.15 (*)    Eosinophils Absolute 0.6 (*)    All other components within normal limits  COMPREHENSIVE METABOLIC PANEL    EKG None  Radiology No results found.  Procedures Procedures   Medications Ordered in ED Medications  oxyCODONE-acetaminophen (PERCOCET/ROXICET) 5-325 MG per tablet 1 tablet (1 tablet Oral Given 10/05/20 1044)    ED Course  I have reviewed the triage vital signs and the nursing notes.  Pertinent labs & imaging results that were available during my care of the patient were reviewed by me and considered in my medical decision making (see chart for details).    MDM Rules/Calculators/A&P                          Labs unremarkable.  Patient with a rise to both feet.  She is given some Percocet and is taking gabapentin and will follow up with the neurologist Final Clinical Impression(s) / ED Diagnoses Final diagnoses:  Foot pain, bilateral    Rx / DC Orders ED Discharge Orders         Ordered    oxyCODONE-acetaminophen (PERCOCET) 5-325 MG tablet  Every 6 hours PRN        10/05/20 1212           Milton Ferguson, MD 10/06/20 1743

## 2020-10-05 NOTE — ED Notes (Signed)
Purewick placed for pt.

## 2020-10-06 ENCOUNTER — Telehealth (INDEPENDENT_AMBULATORY_CARE_PROVIDER_SITE_OTHER): Payer: Self-pay

## 2020-10-06 NOTE — Telephone Encounter (Signed)
Transition Care Management Follow-up Telephone Call  Date of discharge and from where: 10/05/20 to home.  How have you been since you were released from the hospital? No better, still swollen, can bare weight, but she feel is like you are hopping around. Not sure if you need  Referral for Neurology/ [facility]. Any questions or concerns?Just that no one can find out what is really my issue. It is hard to walk to do anything.  Items Reviewed:  Did the pt receive and understand the discharge instructions provided? No, she was told to take the pain medication given and go see neurologist when she can.   Medications obtained and verified? Yes, Percocet 5-325 mg to take  2 tabs q 6 hrs as needed for pain.   Other? no  Any new allergies since your discharge? No  Dietary orders reviewed? No, same as PCP directed.  Do you have support at home?Yes, family. But working she is standing 18 hrs day.  Home Care and Equipment/Supplies: Were home health services ordered?NO If so, what is the name of the agency? no Has the agency set up a time to come to the patient's home? No Were any new equipment or medical supplies ordered?  NO What is the name of the medical supply agency? NO Were you able to get the supplies/equipment? NONE NEED AT THIS TIME. Do you have any questions related to the use of the equipment or supplies? NO  Functional Questionnaire: (I = Independent and D = Dependent) ADLs:I  Bathing/Dressing- I ;BUT IN THE EVENING SHE HAS TO HAVE HELP.  Meal Prep- I   FAMILY DOES AT EVENING; BY THIS TIME SHE CANNOT STAND IN EVENING(18 DR ON FEET IN [facility])  Eating- I  Maintaining continence- I  Transferring/Ambulation- I  Managing Meds- I  Follow up appointments reviewed:   PCP Hospital f/u appt confirmed? YES,  Scheduled to see  DR Anastasio Champion on 10/16/20  @ 01:30PM .  [facility] f/u appt confirmed? NO;tba   Scheduled to see  Are transportation arrangements needed?  NOT NEEDED.   If their condition worsens, is the pt aware to call PCP or go to the Emergency Dept.? [place]. WILL DO A MYCHART IF IT GETS WORSE.  Was the patient provided with contact information for the PCP's office or ED? YES  Was to pt encouraged to call back with questions or concerns? YES, ALSO TO MYCHART IF NEEDED.

## 2020-10-06 NOTE — Telephone Encounter (Signed)
error 

## 2020-10-18 ENCOUNTER — Ambulatory Visit (INDEPENDENT_AMBULATORY_CARE_PROVIDER_SITE_OTHER): Payer: Managed Care, Other (non HMO) | Admitting: Internal Medicine

## 2020-12-08 ENCOUNTER — Ambulatory Visit (INDEPENDENT_AMBULATORY_CARE_PROVIDER_SITE_OTHER): Payer: Managed Care, Other (non HMO) | Admitting: Internal Medicine

## 2020-12-14 ENCOUNTER — Encounter: Payer: Self-pay | Admitting: Neurology

## 2020-12-14 ENCOUNTER — Ambulatory Visit: Payer: Managed Care, Other (non HMO) | Admitting: Neurology

## 2021-04-04 ENCOUNTER — Other Ambulatory Visit: Payer: Self-pay | Admitting: Nurse Practitioner

## 2021-07-07 ENCOUNTER — Ambulatory Visit: Payer: Managed Care, Other (non HMO) | Admitting: Nurse Practitioner

## 2021-08-16 ENCOUNTER — Ambulatory Visit: Payer: Managed Care, Other (non HMO) | Admitting: Nurse Practitioner

## 2021-09-15 ENCOUNTER — Ambulatory Visit: Payer: Managed Care, Other (non HMO) | Admitting: Nurse Practitioner

## 2021-09-15 ENCOUNTER — Encounter: Payer: Self-pay | Admitting: Nurse Practitioner

## 2021-09-15 ENCOUNTER — Other Ambulatory Visit: Payer: Self-pay

## 2021-09-15 VITALS — BP 133/90 | HR 78 | Ht 67.0 in | Wt 258.0 lb

## 2021-09-15 DIAGNOSIS — M5410 Radiculopathy, site unspecified: Secondary | ICD-10-CM | POA: Diagnosis not present

## 2021-09-15 DIAGNOSIS — E559 Vitamin D deficiency, unspecified: Secondary | ICD-10-CM

## 2021-09-15 DIAGNOSIS — I10 Essential (primary) hypertension: Secondary | ICD-10-CM | POA: Diagnosis not present

## 2021-09-15 DIAGNOSIS — Z131 Encounter for screening for diabetes mellitus: Secondary | ICD-10-CM | POA: Insufficient documentation

## 2021-09-15 DIAGNOSIS — F17200 Nicotine dependence, unspecified, uncomplicated: Secondary | ICD-10-CM

## 2021-09-15 DIAGNOSIS — M25571 Pain in right ankle and joints of right foot: Secondary | ICD-10-CM | POA: Diagnosis not present

## 2021-09-15 DIAGNOSIS — F419 Anxiety disorder, unspecified: Secondary | ICD-10-CM | POA: Diagnosis not present

## 2021-09-15 DIAGNOSIS — F3341 Major depressive disorder, recurrent, in partial remission: Secondary | ICD-10-CM

## 2021-09-15 DIAGNOSIS — Z1231 Encounter for screening mammogram for malignant neoplasm of breast: Secondary | ICD-10-CM | POA: Insufficient documentation

## 2021-09-15 DIAGNOSIS — G8929 Other chronic pain: Secondary | ICD-10-CM | POA: Insufficient documentation

## 2021-09-15 DIAGNOSIS — R7302 Impaired glucose tolerance (oral): Secondary | ICD-10-CM | POA: Insufficient documentation

## 2021-09-15 DIAGNOSIS — Z1159 Encounter for screening for other viral diseases: Secondary | ICD-10-CM

## 2021-09-15 MED ORDER — LOSARTAN POTASSIUM 25 MG PO TABS: 25.0000 mg | ORAL_TABLET | Freq: Every day | ORAL | 0 refills | Status: DC

## 2021-09-15 MED ORDER — GABAPENTIN 100 MG PO CAPS: 100.0000 mg | ORAL_CAPSULE | Freq: Three times a day (TID) | ORAL | 2 refills | Status: DC

## 2021-09-15 MED ORDER — IBUPROFEN 600 MG PO TABS: 600.0000 mg | ORAL_TABLET | Freq: Two times a day (BID) | ORAL | 0 refills | Status: DC | PRN

## 2021-09-15 MED ORDER — HYDROXYZINE PAMOATE 50 MG PO CAPS: 50.0000 mg | ORAL_CAPSULE | Freq: Every day | ORAL | 2 refills | Status: DC | PRN

## 2021-09-15 MED ORDER — ESCITALOPRAM OXALATE 10 MG PO TABS: 10.0000 mg | ORAL_TABLET | Freq: Every day | ORAL | 0 refills | Status: DC

## 2021-09-15 NOTE — Assessment & Plan Note (Signed)
She has been smoking tobacco since about  27 years ago, smokes half a pack daily,was doing one pack a day until last year ?Need to quit smoking discussed with patient risk of developing lung cancer COPD discussed with patient patient not ready to quit smoking today smoking cessation education materials given. Marland Kitchen  ? ?

## 2021-09-15 NOTE — Assessment & Plan Note (Signed)
Chronic pain getting worse ?Start ibuprofen 600 mg twice daily prn ?On gabapentin 100 mg 3 times daily for radiculopathy of arm. ?Referred to podiatry ?

## 2021-09-15 NOTE — Progress Notes (Signed)
? ?New Patient Office Visit ? ?Subjective:  ?Patient ID: Diana Jensen, female    DOB: 1976-02-16  Age: 46 y.o. MRN: [id number] ? ?CC:  ?Chief Complaint  ?Patient presents with  ? New Patient (Initial Visit)  ?  NP  ? Joint Swelling  ?  Both ankles swell and are in pain especially after working all day has been going on for years but in the last 4 months this has worsened   ? ? ?HPI ?[name] with past medical history of essential benign hypertension, hypertonus syndrome of right wrist, major recurrent depression, vitamin D deficiency presents to establish care for her chronic medical conditions.  previous pt of Dr Conception Chancy.  Currently not taking any medication because she ran out of her medications.  ? ?Pt c/o chronic right  ankle swelling and pain , described pain as burning , aching , with intermittent  numbness and tingling of toes, had a bone popped out in her ankle in 1996, walking and standing makes the pain worse.pain, Tried ibuprofen, advil , nothing  helped.  ? ?Anxiety and depression. She was previously taking med's, Celexa, xanax , cymbalta, stated that hydroxyzine worked best for her  Goes to counseling at [facility] in [place] every 2 weeks. Issues that went on in the past causes her anxiety and depression, denies SI, HI.  Has quit several jobs because of her condition.  ? ? ?Has had partial hysterectomy for abnormal bleeding, still has her ovaries  ? ? ?Past Medical History:  ?Diagnosis Date  ? Allergy   ? seasonal  ? Anxiety   ? Arthritis   ? deterioration of spine, knees  ? Carpal tunnel syndrome of right wrist   ? Clotting disorder ([place])   ? antiphospholipid syndrome  ? Depression   ? Essential hypertension, benign 02/10/2020  ? ? ?Past Surgical History:  ?Procedure Laterality Date  ? ABDOMINAL HYSTERECTOMY  2011  ? partial - bleeding  ? CARPAL TUNNEL RELEASE Right 01/09/2019  ? Procedure: RIGHT CARPAL TUNNEL RELEASE;  Surgeon: Carole Civil, MD;  Location: [facility];  Service: Orthopedics;   Laterality: Right;  ? DILATION AND CURETTAGE OF UTERUS    ? x2  ? EYE SURGERY    ? lazy eye  ? ? ?Family History  ?Problem Relation Age of Onset  ? Cancer Mother   ?     breast   ? Arthritis Mother   ? Hypertension Mother   ? Osteoporosis Mother   ? Thyroid disease Father   ? Hypertension Father   ? Mental illness Sister   ?     depression ? bipolar  ? Mental illness Daughter   ? Dementia Maternal Grandmother   ? Cancer Maternal Grandfather   ? Heart disease Paternal Grandmother   ? Cancer Paternal Grandfather   ? Colon cancer Paternal Grandfather   ? Cancer - Lung Neg Hx   ? Cervical cancer Neg Hx   ? ? ?Social History  ? ?Socioeconomic History  ? Marital status: Married  ?  Spouse name: Not on file  ? Number of children: 2  ? Years of education: Not on file  ? Highest education level: Not on file  ?Occupational History  ? Not on file  ?Tobacco Use  ? Smoking status: Every Day  ?  Packs/day: 0.50  ?  Years: 20.00  ?  Pack years: 10.00  ?  Types: Cigarettes  ? Smokeless tobacco: Never  ? Tobacco comments:  ?  She  has been smoking since age 35, smokes half a pack daily,was doing one pack a day until last years.   ?Vaping Use  ? Vaping Use: Never used  ?Substance and Sexual Activity  ? Alcohol use: No  ? Drug use: No  ? Sexual activity: Yes  ?  Birth control/protection: Surgical  ?Other Topics Concern  ? Not on file  ?Social History Narrative  ? Associates degree in medical assistant. 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 Has 2 children 1 son and 1 daughter. Married x 15 years.  ? ?Social Determinants of Health  ? ?Financial Resource Strain: Not on file  ?Food Insecurity: Not on file  ?Transportation Needs: Not on file  ?Physical Activity: Not on file  ?Stress: Not on file  ?Social Connections: Not on file  ?Intimate Partner Violence: Not on file  ? ? ?ROS ?Review of Systems  ?Constitutional: Negative.   ?Respiratory: Negative.    ?Cardiovascular: Negative.   ?Musculoskeletal:  Positive for  arthralgias.  ?Psychiatric/Behavioral:  Negative for behavioral problems, confusion, decreased concentration, dysphoric mood, hallucinations, self-injury, sleep disturbance and suicidal ideas. The patient is nervous/anxious. The patient is not hyperactive.   ? ?Objective:  ? ?Today's Vitals: BP 133/90 (BP Location: Right Arm, Patient Position: Sitting, Cuff Size: Large)   Pulse 78   Ht _0  (1.702 m)   Wt 258 lb (117 kg)   SpO2 98%   BMI 40.41 kg/m?  ? ?Physical Exam ?Constitutional:   ?   General: She is not in acute distress. ?   Appearance: She is obese. She is not ill-appearing, toxic-appearing or diaphoretic.  ?Cardiovascular:  ?   Rate and Rhythm: Normal rate and regular rhythm.  ?   Pulses: Normal pulses.  ?   Heart sounds: Normal heart sounds. No murmur heard. ?  No friction rub. No gallop.  ?Pulmonary:  ?   Effort: Pulmonary effort is normal. No respiratory distress.  ?   Breath sounds: Normal breath sounds. No stridor. No wheezing, rhonchi or rales.  ?Chest:  ?   Chest wall: No tenderness.  ?Musculoskeletal:     ?   General: No swelling, tenderness, deformity or signs of injury. Normal range of motion.  ?   Right lower leg: No edema.  ?   Left lower leg: No edema.  ?Neurological:  ?   Mental Status: She is alert and oriented to person, place, and time.  ?Psychiatric:     ?   Mood and Affect: Mood normal.     ?   Behavior: Behavior normal.     ?   Thought Content: Thought content normal.     ?   Judgment: Judgment normal.  ? ? ?Assessment & Plan:  ? ?Problem List Items Addressed This Visit   ? ?  ? Cardiovascular and Mediastinum  ? Essential hypertension, benign - Primary  ?  BP Readings from Last 3 Encounters:  ?09/15/21 133/90  ?10/05/20 123/78  ?09/22/20 131/88  ?Refill losartan 25 mg daily ?CMP plus EGFR today, BMP in 2 weeks ?DASH diet advised engage in regular exercise.  ?Follow-up in 6 weeks ?  ?  ? Relevant Medications  ? losartan (COZAAR) 25 MG tablet  ? Other Relevant Orders  ? CMP14+EGFR  ?  TSH  ? Lipid panel  ? CBC with Differential  ?  ? Endocrine  ? Impaired glucose tolerance  ? Relevant Orders  ? HgB A1c  ?  ? Nervous and Auditory  ?  Radiculopathy of arm  ?  Chronic condition. ?Was taking gabapentin 100 mg 3 times daily ?Medications refilled today ?  ?  ? Relevant Medications  ? gabapentin (NEURONTIN) 100 MG capsule  ? escitalopram (LEXAPRO) 10 MG tablet  ? hydrOXYzine (VISTARIL) 50 MG capsule  ?  ? Other  ? Depression, major, recurrent, in partial remission ([place])  ?  Was on medication in the past ?Start Lexapro 10 mg daily ?Follow-up in 6 weeks ?Denies SI, HI ?PHQ 9 score 16 ?Continue counseling ?  ?  ? Relevant Medications  ? escitalopram (LEXAPRO) 10 MG tablet  ? hydrOXYzine (VISTARIL) 50 MG capsule  ? Vitamin D deficiency  ?  Check vitamin d level ?Currently not on meds ?  ?  ? Relevant Orders  ? VITAMIN D 25 Hydroxy (Vit-D Deficiency, Fractures)  ? Anxiety  ?  GAD-7 score 21 ?Currently going for counseeling ?Start Lexapro 10 mg daily, take hydroxyzine 50 mg daily as needed ?Follow-up in 6 weeks ?Denies SI, HI.  ?  ?  ? Relevant Medications  ? escitalopram (LEXAPRO) 10 MG tablet  ? hydrOXYzine (VISTARIL) 50 MG capsule  ? Chronic pain of right ankle  ?  Chronic pain getting worse ?Start ibuprofen 600 mg twice daily prn ?On gabapentin 100 mg 3 times daily for radiculopathy of arm. ?Referred to podiatry ?  ?  ? Relevant Medications  ? gabapentin (NEURONTIN) 100 MG capsule  ? ibuprofen (ADVIL) 600 MG tablet  ? escitalopram (LEXAPRO) 10 MG tablet  ? Other Relevant Orders  ? Ambulatory referral to Podiatry  ? Need for hepatitis C screening test  ? Relevant Orders  ? Hepatitis C antibody  ? Encounter for screening mammogram for malignant neoplasm of breast  ? Relevant Orders  ? MM Digital Screening  ? ? ?Outpatient Encounter Medications as of 09/15/2021  ?Medication Sig  ? Cholecalciferol (D3-1000) 25 MCG (1000 UT) tablet Take 1,000 Units by mouth daily.  ? escitalopram (LEXAPRO) 10 MG tablet Take 1  tablet (10 mg total) by mouth daily.  ? ibuprofen (ADVIL) 600 MG tablet Take 1 tablet (600 mg total) by mouth 2 (two) times daily as needed.  ? gabapentin (NEURONTIN) 100 MG capsule Take 1 capsule (100 mg total)

## 2021-09-15 NOTE — Assessment & Plan Note (Addendum)
GAD-7 score 21 ?Currently going for counseeling ?Start Lexapro 10 mg daily, take hydroxyzine 50 mg daily as needed ?Follow-up in 6 weeks ?Denies SI, HI.  ?

## 2021-09-15 NOTE — Assessment & Plan Note (Signed)
Was on medication in the past ?Start Lexapro 10 mg daily ?Follow-up in 6 weeks ?Denies SI, HI ?PHQ 9 score 16 ?Continue counseling ?

## 2021-09-15 NOTE — Assessment & Plan Note (Signed)
Check vitamin d level ?Currently not on meds ?

## 2021-09-15 NOTE — Assessment & Plan Note (Signed)
Chronic condition. ?Was taking gabapentin 100 mg 3 times daily ?Medications refilled today ?

## 2021-09-15 NOTE — Assessment & Plan Note (Addendum)
BP Readings from Last 3 Encounters:  ?09/15/21 133/90  ?10/05/20 123/78  ?09/22/20 131/88  ?Refill losartan 25 mg daily ?CMP plus EGFR today, BMP in 2 weeks ?DASH diet advised engage in regular exercise.  ?Follow-up in 6 weeks ?

## 2021-09-15 NOTE — Patient Instructions (Addendum)
Please take tylenol 650 mg every 6 hours as needed for your ankle  pain, you can also take ibuprofen '600mg'$  two times daily as needed.  you have been referred to podiatry  ? ?Take lexapro 10 mg daily for your depression and anxiety, take hydroxyzine 50 mg daily as needed.  ? ? ?It is important that you exercise regularly at least 30 minutes 5 times a week.  ?Think about what you will eat, plan ahead. ?Choose " clean, green, fresh or frozen" over canned, processed or packaged foods which are more sugary, salty and fatty. ?70 to 75% of food eaten should be vegetables and fruit. ?Three meals at set times with snacks allowed between meals, but they must be fruit or vegetables. ?Aim to eat over a 12 hour period , example 7 am to 7 pm, and STOP after  your last meal of the day. ?Drink water,generally about 64 ounces per day, no other drink is as healthy. Fruit juice is best enjoyed in a healthy way, by EATING the fruit. ? ?Thanks for choosing [facility], we consider it a privelige to serve you. ? ?

## 2021-09-16 LAB — CMP14+EGFR
ALT: 57 IU/L — ABNORMAL HIGH (ref 0–32)
AST: 26 IU/L (ref 0–40)
Albumin/Globulin Ratio: 2 (ref 1.2–2.2)
Albumin: 4.8 g/dL (ref 3.8–4.8)
Alkaline Phosphatase: 98 IU/L (ref 44–121)
BUN/Creatinine Ratio: 13 (ref 9–23)
BUN: 10 mg/dL (ref 6–24)
Bilirubin Total: 0.5 mg/dL (ref 0.0–1.2)
CO2: 24 mmol/L (ref 20–29)
Calcium: 9.8 mg/dL (ref 8.7–10.2)
Chloride: 100 mmol/L (ref 96–106)
Creatinine, Ser: 0.79 mg/dL (ref 0.57–1.00)
Globulin, Total: 2.4 g/dL (ref 1.5–4.5)
Glucose: 79 mg/dL (ref 70–99)
Potassium: 4.5 mmol/L (ref 3.5–5.2)
Sodium: 137 mmol/L (ref 134–144)
Total Protein: 7.2 g/dL (ref 6.0–8.5)
eGFR: 95 mL/min/{1.73_m2} (ref >59)

## 2021-09-16 NOTE — Progress Notes (Signed)
Please anxiety is elevated, patient should avoid alcohol, Tylenol, we will recheck labs at next visit ?Kidney function normal electrolytes normal. ?

## 2021-09-21 ENCOUNTER — Ambulatory Visit (HOSPITAL_COMMUNITY)
Admission: RE | Admit: 2021-09-21 | Discharge: 2021-09-21 | Disposition: A | Discharge status: Home / Self Care | Payer: Managed Care, Other (non HMO) | Source: Ambulatory Visit | Attending: Nurse Practitioner | Admitting: Nurse Practitioner

## 2021-09-21 DIAGNOSIS — Z1231 Encounter for screening mammogram for malignant neoplasm of breast: Secondary | ICD-10-CM | POA: Insufficient documentation

## 2021-09-27 ENCOUNTER — Ambulatory Visit: Payer: Managed Care, Other (non HMO) | Admitting: Podiatry

## 2021-10-06 ENCOUNTER — Ambulatory Visit: Payer: Managed Care, Other (non HMO) | Admitting: Podiatry

## 2021-10-08 ENCOUNTER — Other Ambulatory Visit: Payer: Self-pay | Admitting: Nurse Practitioner

## 2021-10-08 DIAGNOSIS — F419 Anxiety disorder, unspecified: Secondary | ICD-10-CM

## 2021-10-10 ENCOUNTER — Other Ambulatory Visit: Payer: Self-pay | Admitting: Nurse Practitioner

## 2021-10-10 DIAGNOSIS — F419 Anxiety disorder, unspecified: Secondary | ICD-10-CM

## 2021-10-10 MED ORDER — ESCITALOPRAM OXALATE 10 MG PO TABS: 10.0000 mg | ORAL_TABLET | Freq: Every day | ORAL | 0 refills | Status: DC

## 2021-12-06 ENCOUNTER — Encounter: Payer: Managed Care, Other (non HMO) | Admitting: Nurse Practitioner

## 2021-12-07 ENCOUNTER — Encounter: Payer: Managed Care, Other (non HMO) | Admitting: Nurse Practitioner

## 2021-12-19 ENCOUNTER — Other Ambulatory Visit: Payer: Self-pay | Admitting: Nurse Practitioner

## 2021-12-19 DIAGNOSIS — I10 Essential (primary) hypertension: Secondary | ICD-10-CM

## 2022-01-24 ENCOUNTER — Encounter: Payer: Self-pay | Admitting: Nurse Practitioner

## 2022-07-03 ENCOUNTER — Ambulatory Visit (INDEPENDENT_AMBULATORY_CARE_PROVIDER_SITE_OTHER): Payer: Managed Care, Other (non HMO) | Admitting: Internal Medicine

## 2022-07-03 ENCOUNTER — Encounter: Payer: Self-pay | Admitting: Internal Medicine

## 2022-07-03 VITALS — BP 132/86 | HR 105 | Ht 67.0 in | Wt 270.0 lb

## 2022-07-03 DIAGNOSIS — Z23 Encounter for immunization: Secondary | ICD-10-CM | POA: Diagnosis not present

## 2022-07-03 DIAGNOSIS — I10 Essential (primary) hypertension: Secondary | ICD-10-CM

## 2022-07-03 DIAGNOSIS — E559 Vitamin D deficiency, unspecified: Secondary | ICD-10-CM | POA: Diagnosis not present

## 2022-07-03 DIAGNOSIS — N6342 Unspecified lump in left breast, subareolar: Secondary | ICD-10-CM

## 2022-07-03 DIAGNOSIS — Z0001 Encounter for general adult medical examination with abnormal findings: Secondary | ICD-10-CM

## 2022-07-03 DIAGNOSIS — F419 Anxiety disorder, unspecified: Secondary | ICD-10-CM

## 2022-07-03 DIAGNOSIS — Z1159 Encounter for screening for other viral diseases: Secondary | ICD-10-CM | POA: Diagnosis not present

## 2022-07-03 DIAGNOSIS — R7302 Impaired glucose tolerance (oral): Secondary | ICD-10-CM

## 2022-07-03 HISTORY — DX: Encounter for general adult medical examination with abnormal findings: Z00.01

## 2022-07-03 MED ORDER — LOSARTAN POTASSIUM 25 MG PO TABS: 25.0000 mg | ORAL_TABLET | Freq: Every day | ORAL | 1 refills | Status: DC

## 2022-07-03 MED ORDER — ESCITALOPRAM OXALATE 10 MG PO TABS: 10.0000 mg | ORAL_TABLET | Freq: Every day | ORAL | 0 refills | Status: DC

## 2022-07-03 MED ORDER — HYDROXYZINE PAMOATE 50 MG PO CAPS: 50.0000 mg | ORAL_CAPSULE | Freq: Every day | ORAL | 2 refills | Status: DC | PRN

## 2022-07-03 NOTE — Assessment & Plan Note (Signed)
Presenting today for annual exam.  Previous records and labs been reviewed. -Repeat labs ordered today, including one-time HCV screening -Influenza vaccine administered today -We will tentatively plan for follow-up in 1 month

## 2022-07-03 NOTE — Assessment & Plan Note (Signed)
Her initial blood pressure today was 150/95 and improved to 132/86 on repeat.  She was previously prescribed losartan 25 mg daily, but has been out of the medication recently due to insurance issues. -Restart losartan 25 mg daily today -Follow-up in 1 month for HTN check

## 2022-07-03 NOTE — Progress Notes (Signed)
Complete physical exam  Patient: Diana Jensen   DOB: 1975-12-09   47 y.o. Female  MRN: [id number]  Subjective:    Chief Complaint  Patient presents with   Annual Exam    Wants to get back on medications    Diana Jensen is a 47 y.o. female who presents today for a complete physical exam. She reports consuming a general diet. The patient does not participate in regular exercise at present. She generally feels fairly well. She reports sleeping fairly well. She does have additional problems to discuss today. She is interested in restarting medications for treatment of anxiety/depression and hypertension. She is also concerned about a mass in her left breast that she has noticed recently.   Most recent fall risk assessment:    07/03/2022    1:10 PM  Fall Risk   Falls in the past year? 0  Number falls in past yr: 0  Injury with Fall? 0  Risk for fall due to : No Fall Risks  Follow up Falls evaluation completed     Most recent depression screenings:    07/03/2022    1:11 PM 09/15/2021    1:09 PM  PHQ 2/9 Scores  PHQ - 2 Score 6 4  PHQ- 9 Score 19 16    Vision:Not within last year  and Dental: No current dental problems and No regular dental care   Past Medical History:  Diagnosis Date   Allergy    seasonal   Anxiety    Arthritis    deterioration of spine, knees   Carpal tunnel syndrome of right wrist    Clotting disorder ([place])    antiphospholipid syndrome   Depression    Encounter for general adult medical examination with abnormal findings 07/03/2022   Essential hypertension, benign 02/10/2020   Past Surgical History:  Procedure Laterality Date   ABDOMINAL HYSTERECTOMY  2011   partial - bleeding   CARPAL TUNNEL RELEASE Right 01/09/2019   Procedure: RIGHT CARPAL TUNNEL RELEASE;  Surgeon: Carole Civil, MD;  Location: [facility];  Service: Orthopedics;  Laterality: Right;   DILATION AND CURETTAGE OF UTERUS     x2   EYE SURGERY     lazy eye   Social History    Tobacco Use   Smoking status: Every Day    Packs/day: 0.50    Years: 20.00    Total pack years: 10.00    Types: Cigarettes   Smokeless tobacco: Never   Tobacco comments:    She has been smoking since age 34, smokes half a pack daily,was doing one pack a day until last year   Vaping Use   Vaping Use: Never used  Substance Use Topics   Alcohol use: No   Drug use: No   Family History  Problem Relation Age of Onset   Cancer Mother        breast    Arthritis Mother    Hypertension Mother    Osteoporosis Mother    Thyroid disease Father    Hypertension Father    Mental illness Sister        depression ? bipolar   Mental illness Daughter    Dementia Maternal Grandmother    Cancer Maternal Grandfather    Heart disease Paternal Grandmother    Cancer Paternal Grandfather    Colon cancer Paternal Grandfather    Cancer - Lung Neg Hx    Cervical cancer Neg Hx    No Known Allergies  Patient Care Team: Johnette Abraham, MD as PCP - General (Internal Medicine)   Outpatient Medications Prior to Visit  Medication Sig   ibuprofen (ADVIL) 600 MG tablet Take 1 tablet (600 mg total) by mouth 2 (two) times daily as needed. (Patient not taking: Reported on 07/03/2022)   [DISCONTINUED] Cholecalciferol (D3-1000) 25 MCG (1000 UT) tablet Take 1,000 Units by mouth daily. (Patient not taking: Reported on 07/03/2022)   [DISCONTINUED] escitalopram (LEXAPRO) 10 MG tablet Take 1 tablet (10 mg total) by mouth daily. (Patient not taking: Reported on 07/03/2022)   [DISCONTINUED] gabapentin (NEURONTIN) 100 MG capsule Take 1 capsule (100 mg total) by mouth 3 (three) times daily. (Patient not taking: Reported on 07/03/2022)   [DISCONTINUED] hydrOXYzine (VISTARIL) 50 MG capsule Take 1 capsule (50 mg total) by mouth daily as needed for anxiety. (Patient not taking: Reported on 07/03/2022)   [DISCONTINUED] losartan (COZAAR) 25 MG tablet TAKE 1 TABLET BY MOUTH ONCE DAILY. (Patient not taking: Reported on 07/03/2022)    No facility-administered medications prior to visit.    Review of Systems  Psychiatric/Behavioral:  Positive for depression. The patient is nervous/anxious.   All other systems reviewed and are negative.     Objective:     BP (!) 150/95   Pulse (!) 105   Ht '5\' 7"'$  (1.702 m)   Wt 270 lb (122.5 kg)   SpO2 96%   BMI 42.29 kg/m  BP Readings from Last 3 Encounters:  07/03/22 (!) 150/95  09/15/21 133/90  10/05/20 123/78   Physical Exam Constitutional:      General: She is not in acute distress.    Appearance: Normal appearance. She is obese. She is not toxic-appearing.  HENT:     Head: Normocephalic and atraumatic.     Right Ear: External ear normal.     Left Ear: External ear normal.     Nose: Nose normal. No congestion or rhinorrhea.     Mouth/Throat:     Mouth: Mucous membranes are moist.     Pharynx: Oropharynx is clear. No oropharyngeal exudate or posterior oropharyngeal erythema.  Eyes:     General: No scleral icterus.    Extraocular Movements: Extraocular movements intact.     Conjunctiva/sclera: Conjunctivae normal.     Pupils: Pupils are equal, round, and reactive to light.  Cardiovascular:     Rate and Rhythm: Normal rate and regular rhythm.     Pulses: Normal pulses.     Heart sounds: Normal heart sounds. No murmur heard.    No friction rub. No gallop.  Pulmonary:     Effort: Pulmonary effort is normal.     Breath sounds: Normal breath sounds. No wheezing, rhonchi or rales.  Chest:  Breasts:    Left: Mass (palpable, firm areolar nodule medial to the L nipple) present. No nipple discharge, skin change or tenderness.  Abdominal:     General: Abdomen is flat. Bowel sounds are normal. There is no distension.     Palpations: Abdomen is soft.     Tenderness: There is no abdominal tenderness.  Musculoskeletal:        General: No swelling. Normal range of motion.     Cervical back: Normal range of motion.     Right lower leg: No edema.     Left lower leg: No  edema.  Lymphadenopathy:     Cervical: No cervical adenopathy.  Skin:    General: Skin is warm and dry.     Capillary Refill: Capillary refill takes less  than 2 seconds.     Coloration: Skin is not jaundiced.  Neurological:     General: No focal deficit present.     Mental Status: She is alert and oriented to person, place, and time.  Psychiatric:        Mood and Affect: Mood normal.        Behavior: Behavior normal.     Last CBC Lab Results  Component Value Date   WBC 8.1 10/05/2020   HGB 14.6 10/05/2020   HCT 44.7 10/05/2020   MCV 86.8 10/05/2020   MCH 28.3 10/05/2020   RDW 13.2 10/05/2020   PLT 325 43/32/9518   Last metabolic panel Lab Results  Component Value Date   GLUCOSE 79 09/15/2021   NA 137 09/15/2021   K 4.5 09/15/2021   CL 100 09/15/2021   CO2 24 09/15/2021   BUN 10 09/15/2021   CREATININE 0.79 09/15/2021   EGFR 95 09/15/2021   CALCIUM 9.8 09/15/2021   PROT 7.2 09/15/2021   ALBUMIN 4.8 09/15/2021   LABGLOB 2.4 09/15/2021   AGRATIO 2.0 09/15/2021   BILITOT 0.5 09/15/2021   ALKPHOS 98 09/15/2021   AST 26 09/15/2021   ALT 57 (H) 09/15/2021   ANIONGAP 12 10/05/2020   Last lipids Lab Results  Component Value Date   CHOL 226 (H) 11/11/2019   HDL 38 (L) 11/11/2019   LDLCALC 152 (H) 11/11/2019   TRIG 222 (H) 11/11/2019   CHOLHDL 5.9 (H) 11/11/2019   Last hemoglobin A1c Lab Results  Component Value Date   HGBA1C 5.6 02/10/2020   Last thyroid functions Lab Results  Component Value Date   TSH 2.00 02/10/2020   Last vitamin D Lab Results  Component Value Date   VD25OH 21 (L) 09/22/2020   Last vitamin B12 and Folate Lab Results  Component Value Date   VITAMINB12 276 09/22/2020      Assessment & Plan:    Routine Health Maintenance and Physical Exam  Immunization History  Administered Date(s) Administered   Influenza,inj,Quad PF,6+ Mos 02/22/2016   Influenza-Unspecified 03/23/2020   Tdap 03/23/2020    Health Maintenance  Topic  Date Due   Hepatitis C Screening  Never done   INFLUENZA VACCINE  01/24/2022   DTaP/Tdap/Td (2 - Td or Tdap) 03/23/2030   HIV Screening  Completed   HPV VACCINES  Aged Out   COVID-19 Vaccine  Discontinued    Discussed health benefits of physical activity, and encouraged her to engage in regular exercise appropriate for her age and condition.  Problem List Items Addressed This Visit       Essential hypertension, benign    Her initial blood pressure today was 150/95 and improved to 132/86 on repeat.  She was previously prescribed losartan 25 mg daily, but has been out of the medication recently due to insurance issues. -Restart losartan 25 mg daily today -Follow-up in 1 month for HTN check      Anxiety    Patient reports that her anxiety is been worse recently.  This was previously treated with Lexapro and hydroxyzine, however she has been off of Lexapro since last summer due to insurance issues.  Her GAD-7 score is 17 today.  Denies SI/HI. -Restart Lexapro 10 mg daily -Continue hydroxyzine for as needed use -Follow-up in 1 month for reassessment      Subareolar mass of left breast    She had a normal mammogram in March 2023, however more recently she has noticed a firm nodule along the areola medial  to the left nipple.  She has not appreciated any discharge.  She is concerned because her mother had breast cancer. -[place] left breast ordered today      Encounter for general adult medical examination with abnormal findings    Presenting today for annual exam.  Previous records and labs been reviewed. -Repeat labs ordered today, including one-time HCV screening -Influenza vaccine administered today -We will tentatively plan for follow-up in 1 month      Return in about 4 weeks (around 07/31/2022) for HTN.  Johnette Abraham, MD

## 2022-07-03 NOTE — Assessment & Plan Note (Signed)
She had a normal mammogram in March 2023, however more recently she has noticed a firm nodule along the areola medial to the left nipple.  She has not appreciated any discharge.  She is concerned because her mother had breast cancer. -[place] left breast ordered today

## 2022-07-03 NOTE — Patient Instructions (Signed)
It was a pleasure to see you today.  Thank you for giving [place] the opportunity to be involved in your care.  Below is a brief recap of your visit and next steps.  We will plan to see you again in 1 month.  Summary We completed you annual exam today Restart losartan, lexapro, and hydroxyzine Repeat labs have been ordered You will receive your flu shot Follow up in 1 month

## 2022-07-03 NOTE — Assessment & Plan Note (Signed)
Patient reports that her anxiety is been worse recently.  This was previously treated with Lexapro and hydroxyzine, however she has been off of Lexapro since last summer due to insurance issues.  Her GAD-7 score is 17 today.  Denies SI/HI. -Restart Lexapro 10 mg daily -Continue hydroxyzine for as needed use -Follow-up in 1 month for reassessment

## 2022-07-04 ENCOUNTER — Other Ambulatory Visit: Payer: Self-pay | Admitting: Internal Medicine

## 2022-07-04 DIAGNOSIS — E559 Vitamin D deficiency, unspecified: Secondary | ICD-10-CM

## 2022-07-04 MED ORDER — VITAMIN D (ERGOCALCIFEROL) 1.25 MG (50000 UNIT) PO CAPS: 50000.0000 [IU] | ORAL_CAPSULE | ORAL | 0 refills | Status: AC

## 2022-07-05 LAB — LIPID PANEL
Chol/HDL Ratio: 7.7 ratio — ABNORMAL HIGH (ref 0.0–4.4)
Cholesterol, Total: 254 mg/dL — ABNORMAL HIGH (ref 100–199)
HDL: 33 mg/dL — ABNORMAL LOW (ref >39)
LDL Chol Calc (NIH): 163 mg/dL — ABNORMAL HIGH (ref 0–99)
Triglycerides: 308 mg/dL — ABNORMAL HIGH (ref 0–149)
VLDL Cholesterol Cal: 58 mg/dL — ABNORMAL HIGH (ref 5–40)

## 2022-07-05 LAB — CBC WITH DIFFERENTIAL/PLATELET
Basophils Absolute: 0.1 10*3/uL (ref 0.0–0.2)
Basos: 1 %
EOS (ABSOLUTE): 0.4 10*3/uL (ref 0.0–0.4)
Eos: 5 %
Hematocrit: 42.1 % (ref 34.0–46.6)
Hemoglobin: 14.7 g/dL (ref 11.1–15.9)
Immature Grans (Abs): 0.1 10*3/uL (ref 0.0–0.1)
Immature Granulocytes: 1 %
Lymphocytes Absolute: 2.2 10*3/uL (ref 0.7–3.1)
Lymphs: 27 %
MCH: 28.6 pg (ref 26.6–33.0)
MCHC: 34.9 g/dL (ref 31.5–35.7)
MCV: 82 fL (ref 79–97)
Monocytes Absolute: 0.6 10*3/uL (ref 0.1–0.9)
Monocytes: 7 %
Neutrophils Absolute: 4.7 10*3/uL (ref 1.4–7.0)
Neutrophils: 59 %
Platelets: 307 10*3/uL (ref 150–450)
RBC: 5.14 x10E6/uL (ref 3.77–5.28)
RDW: 13.3 % (ref 11.7–15.4)
WBC: 8 10*3/uL (ref 3.4–10.8)

## 2022-07-05 LAB — CMP14+EGFR
ALT: 66 IU/L — ABNORMAL HIGH (ref 0–32)
AST: 37 IU/L (ref 0–40)
Albumin/Globulin Ratio: 1.9 (ref 1.2–2.2)
Albumin: 4.6 g/dL (ref 3.9–4.9)
Alkaline Phosphatase: 94 IU/L (ref 44–121)
BUN/Creatinine Ratio: 23 (ref 9–23)
BUN: 18 mg/dL (ref 6–24)
Bilirubin Total: 0.4 mg/dL (ref 0.0–1.2)
CO2: 21 mmol/L (ref 20–29)
Calcium: 9 mg/dL (ref 8.7–10.2)
Chloride: 105 mmol/L (ref 96–106)
Creatinine, Ser: 0.8 mg/dL (ref 0.57–1.00)
Globulin, Total: 2.4 g/dL (ref 1.5–4.5)
Glucose: 93 mg/dL (ref 70–99)
Potassium: 3.8 mmol/L (ref 3.5–5.2)
Sodium: 142 mmol/L (ref 134–144)
Total Protein: 7 g/dL (ref 6.0–8.5)
eGFR: 93 mL/min/{1.73_m2} (ref >59)

## 2022-07-05 LAB — TSH+FREE T4
Free T4: 1.05 ng/dL (ref 0.82–1.77)
TSH: 1.41 u[IU]/mL (ref 0.450–4.500)

## 2022-07-05 LAB — HEMOGLOBIN A1C
Est. average glucose Bld gHb Est-mCnc: 126 mg/dL
Hgb A1c MFr Bld: 6 % — ABNORMAL HIGH (ref 4.8–5.6)

## 2022-07-05 LAB — B12 AND FOLATE PANEL
Folate: 6.2 ng/mL (ref >3.0)
Vitamin B-12: 281 pg/mL (ref 232–1245)

## 2022-07-05 LAB — HCV AB W REFLEX TO QUANT PCR: HCV Ab: NONREACTIVE

## 2022-07-05 LAB — VITAMIN D 25 HYDROXY (VIT D DEFICIENCY, FRACTURES): Vit D, 25-Hydroxy: 15.4 ng/mL — ABNORMAL LOW (ref 30.0–100.0)

## 2022-07-05 LAB — HCV INTERPRETATION

## 2022-07-11 ENCOUNTER — Other Ambulatory Visit: Payer: Self-pay | Admitting: Internal Medicine

## 2022-07-11 DIAGNOSIS — N6342 Unspecified lump in left breast, subareolar: Secondary | ICD-10-CM

## 2022-07-11 DIAGNOSIS — N6320 Unspecified lump in the left breast, unspecified quadrant: Secondary | ICD-10-CM

## 2022-07-27 ENCOUNTER — Other Ambulatory Visit (HOSPITAL_COMMUNITY): Payer: Self-pay | Admitting: Internal Medicine

## 2022-07-27 ENCOUNTER — Ambulatory Visit (HOSPITAL_COMMUNITY)
Admission: RE | Admit: 2022-07-27 | Discharge: 2022-07-27 | Disposition: A | Discharge status: Home / Self Care | Payer: Managed Care, Other (non HMO) | Source: Ambulatory Visit | Attending: Internal Medicine | Admitting: Internal Medicine

## 2022-07-27 DIAGNOSIS — N6342 Unspecified lump in left breast, subareolar: Secondary | ICD-10-CM

## 2022-07-27 DIAGNOSIS — R928 Other abnormal and inconclusive findings on diagnostic imaging of breast: Secondary | ICD-10-CM

## 2022-07-31 ENCOUNTER — Ambulatory Visit: Payer: Managed Care, Other (non HMO) | Admitting: Internal Medicine

## 2022-07-31 ENCOUNTER — Encounter: Payer: Self-pay | Admitting: Internal Medicine

## 2022-07-31 DIAGNOSIS — I10 Essential (primary) hypertension: Secondary | ICD-10-CM

## 2022-07-31 DIAGNOSIS — E782 Mixed hyperlipidemia: Secondary | ICD-10-CM | POA: Diagnosis not present

## 2022-07-31 DIAGNOSIS — R7303 Prediabetes: Secondary | ICD-10-CM

## 2022-07-31 DIAGNOSIS — N6342 Unspecified lump in left breast, subareolar: Secondary | ICD-10-CM

## 2022-07-31 DIAGNOSIS — E559 Vitamin D deficiency, unspecified: Secondary | ICD-10-CM

## 2022-07-31 NOTE — Patient Instructions (Signed)
It was a pleasure to see you today.  Thank you for giving [place] the opportunity to be involved in your care.  Below is a brief recap of your visit and next steps.  We will plan to see you again in 3 months.  Summary No medication changes today I have placed a referral to a dietician We will plan to repeat your vitamin D level upon completion of weekly supplementation Follow up in 3 months

## 2022-07-31 NOTE — Progress Notes (Unsigned)
Established Patient Office Visit  Subjective   Patient ID: Diana Jensen, female    DOB: Jul 11, 1975  Age: 47 y.o. MRN: [id number]  Chief Complaint  Patient presents with   Hypertension    Follow up   Diana Jensen returns to care today for HTN follow-up.  She was last seen by me on 1/8 for her annual exam.  At that time losartan 25 mg daily was restarted for improved treatment of hypertension.  She also reported a mass in her left breast.  An ultrasound and diagnostic mammogram were ordered.  4-week follow-up was arranged.  In the interim, Diana Jensen underwent left breast ultrasound and diagnostic mammogram on 2/1 the studies demonstrated a 1.9 cm mass in the left breast with imaging features highly suspicious for malignancy.  She is scheduled to undergo a biopsy on 2/15.  There have otherwise been no acute interval events.  Today Diana Jensen expresses concern over recent left breast imaging results.  She is otherwise asymptomatic and has no additional concerns to discuss.  Past Medical History:  Diagnosis Date   Allergy    seasonal   Anxiety    Arthritis    deterioration of spine, knees   Carpal tunnel syndrome of right wrist    Clotting disorder (HCC)    antiphospholipid syndrome   Depression    Encounter for general adult medical examination with abnormal findings 07/03/2022   Essential hypertension, benign 02/10/2020   Hyperlipidemia 08/02/2022   Sleep disorder 02/22/2016   Past Surgical History:  Procedure Laterality Date   CARPAL TUNNEL RELEASE Right 01/09/2019   Procedure: RIGHT CARPAL TUNNEL RELEASE;  Surgeon: Carole Civil, MD;  Location: [facility];  Service: Orthopedics;  Laterality: Right;   DILATION AND CURETTAGE OF UTERUS     x2   EYE SURGERY     lazy eye   TOTAL ABDOMINAL HYSTERECTOMY  2011   Social History   Tobacco Use   Smoking status: Every Day    Packs/day: 0.50    Years: 20.00    Total pack years: 10.00    Types: Cigarettes   Smokeless tobacco: Never    Tobacco comments:    She has been smoking since age 64, smokes half a pack daily,was doing one pack a day until last year   Vaping Use   Vaping Use: Never used  Substance Use Topics   Alcohol use: No   Drug use: No   Family History  Problem Relation Age of Onset   Cancer Mother        breast    Arthritis Mother    Hypertension Mother    Osteoporosis Mother    Thyroid disease Father    Hypertension Father    Mental illness Sister        depression ? bipolar   Mental illness Daughter    Dementia Maternal Grandmother    Cancer Maternal Grandfather    Heart disease Paternal Grandmother    Cancer Paternal Grandfather    Colon cancer Paternal Grandfather    Cancer - Lung Neg Hx    Cervical cancer Neg Hx    No Known Allergies  Review of Systems  Constitutional:  Negative for chills and fever.  HENT:  Negative for sore throat.   Respiratory:  Negative for cough and shortness of breath.   Cardiovascular:  Negative for chest pain, palpitations and leg swelling.  Gastrointestinal:  Negative for abdominal pain, blood in stool, constipation, diarrhea, nausea and vomiting.  Genitourinary:  Negative for dysuria and hematuria.  Musculoskeletal:  Negative for myalgias.  Skin:  Negative for itching and rash.  Neurological:  Negative for dizziness and headaches.  Psychiatric/Behavioral:  Negative for depression and suicidal ideas.      Objective:     BP 124/76   Pulse 96   Ht '5\' 7"'$  (1.702 m)   Wt 262 lb 9.6 oz (119.1 kg)   SpO2 94%   BMI 41.13 kg/m  BP Readings from Last 3 Encounters:  07/31/22 124/76  07/03/22 132/86  09/15/21 133/90   Physical Exam Vitals reviewed.  Constitutional:      General: She is not in acute distress.    Appearance: Normal appearance. She is obese. She is not toxic-appearing.  HENT:     Head: Normocephalic and atraumatic.     Right Ear: External ear normal.     Left Ear: External ear normal.     Nose: Nose normal. No congestion or rhinorrhea.      Mouth/Throat:     Mouth: Mucous membranes are moist.     Pharynx: Oropharynx is clear. No oropharyngeal exudate or posterior oropharyngeal erythema.  Eyes:     General: No scleral icterus.    Extraocular Movements: Extraocular movements intact.     Conjunctiva/sclera: Conjunctivae normal.     Pupils: Pupils are equal, round, and reactive to light.  Cardiovascular:     Rate and Rhythm: Normal rate and regular rhythm.     Pulses: Normal pulses.     Heart sounds: Normal heart sounds. No murmur heard.    No friction rub. No gallop.  Pulmonary:     Effort: Pulmonary effort is normal.     Breath sounds: Normal breath sounds. No wheezing, rhonchi or rales.  Abdominal:     General: Abdomen is flat. Bowel sounds are normal. There is no distension.     Palpations: Abdomen is soft.     Tenderness: There is no abdominal tenderness.  Musculoskeletal:        General: No swelling. Normal range of motion.     Cervical back: Normal range of motion.     Right lower leg: No edema.     Left lower leg: No edema.  Lymphadenopathy:     Cervical: No cervical adenopathy.  Skin:    General: Skin is warm and dry.     Capillary Refill: Capillary refill takes less than 2 seconds.     Coloration: Skin is not jaundiced.  Neurological:     General: No focal deficit present.     Mental Status: She is alert and oriented to person, place, and time.  Psychiatric:        Mood and Affect: Mood normal.        Behavior: Behavior normal.   Last CBC Lab Results  Component Value Date   WBC 8.0 07/03/2022   HGB 14.7 07/03/2022   HCT 42.1 07/03/2022   MCV 82 07/03/2022   MCH 28.6 07/03/2022   RDW 13.3 07/03/2022   PLT 307 97/35/3299   Last metabolic panel Lab Results  Component Value Date   GLUCOSE 93 07/03/2022   NA 142 07/03/2022   K 3.8 07/03/2022   CL 105 07/03/2022   CO2 21 07/03/2022   BUN 18 07/03/2022   CREATININE 0.80 07/03/2022   EGFR 93 07/03/2022   CALCIUM 9.0 07/03/2022   PROT 7.0  07/03/2022   ALBUMIN 4.6 07/03/2022   LABGLOB 2.4 07/03/2022   AGRATIO 1.9 07/03/2022   BILITOT 0.4 07/03/2022  ALKPHOS 94 07/03/2022   AST 37 07/03/2022   ALT 66 (H) 07/03/2022   ANIONGAP 12 10/05/2020   Last lipids Lab Results  Component Value Date   CHOL 254 (H) 07/03/2022   HDL 33 (L) 07/03/2022   LDLCALC 163 (H) 07/03/2022   TRIG 308 (H) 07/03/2022   CHOLHDL 7.7 (H) 07/03/2022   Last hemoglobin A1c Lab Results  Component Value Date   HGBA1C 6.0 (H) 07/03/2022   Last thyroid functions Lab Results  Component Value Date   TSH 1.410 07/03/2022   Last vitamin D Lab Results  Component Value Date   VD25OH 15.4 (L) 07/03/2022   Last vitamin B12 and Folate Lab Results  Component Value Date   VITAMINB12 281 07/03/2022   FOLATE 6.2 07/03/2022   The 10-year ASCVD risk score (Arnett DK, et al., 2019) is: 11.5%    Assessment & Plan:   Problem List Items Addressed This Visit       Essential hypertension, benign    Losartan 25 mg daily was restarted at her last appointment for improved treatment of hypertension.  Her blood pressure today is 124/76. -No changes today.  Continue losartan 25 mg daily.      Vitamin D deficiency    Noted on labs from last month.  She is currently on high-dose, weekly vitamin D supplementation. -Repeat vitamin D level upon completion of high-dose supplementation      Subareolar mass of left breast    As mentioned above, imaging studies from 2/1 were highly concerning for malignancy.  She is scheduled to undergo biopsy of the left breast mass on 2/15.      Prediabetes    Noted on labs from last month.  A1c 6.0.  She is interested in speaking with a dietitian about necessary dietary changes. -Referral placed to medical nutrition therapy today      Hyperlipidemia    Lipid panel updated last month.  Total cholesterol 254, LDL 163, TGs 308.  Her 10-year ASCVD risk or today is 11.5%.  We discussed that this places her at an intermediate  cardiovascular risk. -Ms. Payeur would like to attempt dietary changes aimed at improving her cholesterol before starting statin therapy.  As previously noted, I have placed referral to medical nutrition therapy.      Return in about 3 months (around 10/29/2022).   Johnette Abraham, MD

## 2022-08-02 ENCOUNTER — Encounter: Payer: Self-pay | Admitting: Internal Medicine

## 2022-08-02 DIAGNOSIS — E785 Hyperlipidemia, unspecified: Secondary | ICD-10-CM

## 2022-08-02 DIAGNOSIS — R7303 Prediabetes: Secondary | ICD-10-CM | POA: Insufficient documentation

## 2022-08-02 HISTORY — DX: Hyperlipidemia, unspecified: E78.5

## 2022-08-02 NOTE — Assessment & Plan Note (Signed)
Noted on labs from last month.  She is currently on high-dose, weekly vitamin D supplementation. -Repeat vitamin D level upon completion of high-dose supplementation

## 2022-08-02 NOTE — Assessment & Plan Note (Signed)
Losartan 25 mg daily was restarted at her last appointment for improved treatment of hypertension.  Her blood pressure today is 124/76. -No changes today.  Continue losartan 25 mg daily.

## 2022-08-02 NOTE — Assessment & Plan Note (Signed)
Lipid panel updated last month.  Total cholesterol 254, LDL 163, TGs 308.  Her 10-year ASCVD risk or today is 11.5%.  We discussed that this places her at an intermediate cardiovascular risk. -Diana Jensen would like to attempt dietary changes aimed at improving her cholesterol before starting statin therapy.  As previously noted, I have placed referral to medical nutrition therapy.

## 2022-08-02 NOTE — Assessment & Plan Note (Signed)
As mentioned above, imaging studies from 2/1 were highly concerning for malignancy.  She is scheduled to undergo biopsy of the left breast mass on 2/15.

## 2022-08-02 NOTE — Assessment & Plan Note (Signed)
Noted on labs from last month.  A1c 6.0.  She is interested in speaking with a dietitian about necessary dietary changes. -Referral placed to medical nutrition therapy today

## 2022-08-10 ENCOUNTER — Ambulatory Visit (HOSPITAL_COMMUNITY)
Admission: RE | Admit: 2022-08-10 | Discharge: 2022-08-10 | Disposition: A | Discharge status: Home / Self Care | Payer: Managed Care, Other (non HMO) | Source: Ambulatory Visit | Attending: Internal Medicine | Admitting: Internal Medicine

## 2022-08-10 ENCOUNTER — Emergency Department (HOSPITAL_COMMUNITY)
Admission: RE | Admit: 2022-08-10 | Discharge: 2022-08-10 | Disposition: A | Discharge status: Home / Self Care | Payer: Managed Care, Other (non HMO) | Source: Ambulatory Visit | Attending: Internal Medicine | Admitting: Internal Medicine

## 2022-08-10 ENCOUNTER — Other Ambulatory Visit: Payer: Self-pay

## 2022-08-10 ENCOUNTER — Emergency Department (HOSPITAL_COMMUNITY)
Admission: EM | Admit: 2022-08-10 | Discharge: 2022-08-10 | Disposition: A | Discharge status: Home / Self Care | Payer: Managed Care, Other (non HMO) | Attending: Student | Admitting: Student

## 2022-08-10 ENCOUNTER — Encounter (HOSPITAL_COMMUNITY): Payer: Self-pay | Admitting: *Deleted

## 2022-08-10 ENCOUNTER — Encounter (HOSPITAL_COMMUNITY): Payer: Self-pay

## 2022-08-10 ENCOUNTER — Other Ambulatory Visit (HOSPITAL_COMMUNITY): Payer: Self-pay | Admitting: Internal Medicine

## 2022-08-10 VITALS — BP 130/85 | HR 94 | Temp 98.3°F | Resp 18

## 2022-08-10 DIAGNOSIS — R928 Other abnormal and inconclusive findings on diagnostic imaging of breast: Secondary | ICD-10-CM

## 2022-08-10 DIAGNOSIS — R61 Generalized hyperhidrosis: Secondary | ICD-10-CM | POA: Diagnosis not present

## 2022-08-10 DIAGNOSIS — F17210 Nicotine dependence, cigarettes, uncomplicated: Secondary | ICD-10-CM | POA: Diagnosis not present

## 2022-08-10 DIAGNOSIS — N6342 Unspecified lump in left breast, subareolar: Secondary | ICD-10-CM | POA: Insufficient documentation

## 2022-08-10 DIAGNOSIS — R230 Cyanosis: Secondary | ICD-10-CM | POA: Diagnosis not present

## 2022-08-10 DIAGNOSIS — Z79899 Other long term (current) drug therapy: Secondary | ICD-10-CM | POA: Insufficient documentation

## 2022-08-10 DIAGNOSIS — R55 Syncope and collapse: Secondary | ICD-10-CM | POA: Insufficient documentation

## 2022-08-10 DIAGNOSIS — I10 Essential (primary) hypertension: Secondary | ICD-10-CM | POA: Diagnosis not present

## 2022-08-10 HISTORY — PX: BREAST BIOPSY: SHX20

## 2022-08-10 LAB — CBC WITH DIFFERENTIAL/PLATELET
Abs Immature Granulocytes: 0.05 10*3/uL (ref 0.00–0.07)
Basophils Absolute: 0.1 10*3/uL (ref 0.0–0.1)
Basophils Relative: 1 %
Eosinophils Absolute: 0.5 10*3/uL (ref 0.0–0.5)
Eosinophils Relative: 6 %
HCT: 43.7 % (ref 36.0–46.0)
Hemoglobin: 14.7 g/dL (ref 12.0–15.0)
Immature Granulocytes: 1 %
Lymphocytes Relative: 21 %
Lymphs Abs: 1.7 10*3/uL (ref 0.7–4.0)
MCH: 28.5 pg (ref 26.0–34.0)
MCHC: 33.6 g/dL (ref 30.0–36.0)
MCV: 84.7 fL (ref 80.0–100.0)
Monocytes Absolute: 0.4 10*3/uL (ref 0.1–1.0)
Monocytes Relative: 5 %
Neutro Abs: 5.2 10*3/uL (ref 1.7–7.7)
Neutrophils Relative %: 66 %
Platelets: 296 10*3/uL (ref 150–400)
RBC: 5.16 MIL/uL — ABNORMAL HIGH (ref 3.87–5.11)
RDW: 13.1 % (ref 11.5–15.5)
WBC: 7.9 10*3/uL (ref 4.0–10.5)
nRBC: 0 % (ref 0.0–0.2)

## 2022-08-10 LAB — COMPREHENSIVE METABOLIC PANEL
ALT: 65 U/L — ABNORMAL HIGH (ref 0–44)
AST: 42 U/L — ABNORMAL HIGH (ref 15–41)
Albumin: 3.9 g/dL (ref 3.5–5.0)
Alkaline Phosphatase: 86 U/L (ref 38–126)
Anion gap: 12 (ref 5–15)
BUN: 13 mg/dL (ref 6–20)
CO2: 22 mmol/L (ref 22–32)
Calcium: 8.7 mg/dL — ABNORMAL LOW (ref 8.9–10.3)
Chloride: 105 mmol/L (ref 98–111)
Creatinine, Ser: 1 mg/dL (ref 0.44–1.00)
GFR, Estimated: >60 mL/min (ref >60)
Glucose, Bld: 121 mg/dL — ABNORMAL HIGH (ref 70–99)
Potassium: 3.4 mmol/L — ABNORMAL LOW (ref 3.5–5.1)
Sodium: 139 mmol/L (ref 135–145)
Total Bilirubin: 0.9 mg/dL (ref 0.3–1.2)
Total Protein: 7 g/dL (ref 6.5–8.1)

## 2022-08-10 LAB — TROPONIN I (HIGH SENSITIVITY): Troponin I (High Sensitivity): 3 ng/L (ref <18)

## 2022-08-10 LAB — CBG MONITORING, ED: Glucose-Capillary: 124 mg/dL — ABNORMAL HIGH (ref 70–99)

## 2022-08-10 MED ORDER — LIDOCAINE-EPINEPHRINE (PF) 1 %-1:200000 IJ SOLN
10.0000 mL | Freq: Once | INTRAMUSCULAR | Status: AC
  Administered 2022-08-10: 10 mL via INTRADERMAL

## 2022-08-10 MED ORDER — MAGNESIUM OXIDE -MG SUPPLEMENT 400 (240 MG) MG PO TABS
800.0000 mg | ORAL_TABLET | Freq: Once | ORAL | Status: AC
  Administered 2022-08-10: 800 mg via ORAL
  Filled 2022-08-10: qty 2

## 2022-08-10 MED ORDER — LIDOCAINE HCL (PF) 2 % IJ SOLN
10.0000 mL | Freq: Once | INTRAMUSCULAR | Status: AC
  Administered 2022-08-10: 10 mL

## 2022-08-10 MED ORDER — POTASSIUM CHLORIDE CRYS ER 20 MEQ PO TBCR
40.0000 meq | EXTENDED_RELEASE_TABLET | Freq: Once | ORAL | Status: AC
  Administered 2022-08-10: 40 meq via ORAL
  Filled 2022-08-10: qty 2

## 2022-08-10 MED ORDER — LACTATED RINGERS IV BOLUS
1000.0000 mL | Freq: Once | INTRAVENOUS | Status: AC
  Administered 2022-08-10: 1000 mL via INTRAVENOUS

## 2022-08-10 NOTE — ED Provider Notes (Signed)
[facility] Provider Note  CSN: [id number] Arrival date & time: 08/10/22 0847  Chief Complaint(s) Near Syncope  HPI Diana Jensen is a 47 y.o. female with PMH previous episodes of vasovagal syncope, HTN, HLD, antiphospholipid antibody syndrome, breast mass status post breast biopsy today who presents emergency department for evaluation of an episode of near syncope.  Patient received multiple lidocaine injections prior to biopsy and after completing the procedure, when walking the parking lot she had sudden onset lightheadedness and weakness and felt like she was going to pass out.  Patient arrives with skin pallor, lightheadedness and mild diaphoresis.  After speaking with the patient's husband, it seems that she has had a similar episode with medical procedures in the past.  He denies chest pain, shortness of breath, headache, fever or other systemic symptoms.   Past Medical History Past Medical History:  Diagnosis Date   Allergy    seasonal   Anxiety    Arthritis    deterioration of spine, knees   Carpal tunnel syndrome of right wrist    Clotting disorder (HCC)    antiphospholipid syndrome   Depression    Encounter for general adult medical examination with abnormal findings 07/03/2022   Essential hypertension, benign 02/10/2020   Hyperlipidemia 08/02/2022   Sleep disorder 02/22/2016   Patient Active Problem List   Diagnosis Date Noted   Prediabetes 08/02/2022   Hyperlipidemia 08/02/2022   Subareolar mass of left breast 07/03/2022   Encounter for general adult medical examination with abnormal findings 07/03/2022   Impaired glucose tolerance 09/15/2021   Anxiety 09/15/2021   Radiculopathy of arm 09/15/2021   Chronic pain of right ankle 09/15/2021   Screening for diabetes mellitus (DM) 09/15/2021   Need for hepatitis C screening test 09/15/2021   Encounter for screening mammogram for malignant neoplasm of breast 09/15/2021    Current every day smoker 09/15/2021   Left lateral ankle pain 09/22/2020   Numbness and tingling 09/22/2020   Vitamin D deficiency 09/22/2020   Localized swelling, mass, or lump of lower extremity, bilateral 02/10/2020   Essential hypertension, benign 02/10/2020   S/P carpal tunnel release right 01/09/2019 01/14/2019   Carpal tunnel syndrome of right wrist    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 Authorizing Provider  escitalopram (LEXAPRO) 10 MG tablet Take 1 tablet (10 mg total) by mouth daily. 07/03/22   Johnette Abraham, MD  hydrOXYzine (VISTARIL) 50 MG capsule Take 1 capsule (50 mg total) by mouth daily as needed for anxiety. 07/03/22   Johnette Abraham, MD  ibuprofen (ADVIL) 600 MG tablet Take 1 tablet (600 mg total) by mouth 2 (two) times daily as needed. 09/15/21   Paseda, Dewaine Conger, FNP  losartan (COZAAR) 25 MG tablet Take 1 tablet (25 mg total) by mouth daily. 07/03/22 12/30/22  Johnette Abraham, MD  Vitamin D, Ergocalciferol, (DRISDOL) 1.25 MG (50000 UNIT) CAPS capsule Take 1 capsule (50,000 Units total) by mouth every 7 (seven) days for 12 doses. 07/04/22 09/20/22  Johnette Abraham, MD  Past Surgical History Past Surgical History:  Procedure Laterality Date   CARPAL TUNNEL RELEASE Right 01/09/2019   Procedure: RIGHT CARPAL TUNNEL RELEASE;  Surgeon: Carole Civil, MD;  Location: [facility];  Service: Orthopedics;  Laterality: Right;   DILATION AND CURETTAGE OF UTERUS     x2   EYE SURGERY     lazy eye   TOTAL ABDOMINAL  HYSTERECTOMY  2011   Family History Family History  Problem Relation Age of Onset   Cancer Mother        breast    Arthritis Mother    Hypertension Mother    Osteoporosis Mother    Thyroid disease Father    Hypertension Father    Mental illness Sister        depression ? bipolar   Mental illness Daughter    Dementia Maternal Grandmother    Cancer Maternal Grandfather    Heart disease Paternal Grandmother    Cancer Paternal Grandfather    Colon cancer Paternal Grandfather    Cancer - Lung Neg Hx    Cervical cancer Neg Hx     Social History Social History   Tobacco Use   Smoking status: Every Day    Packs/day: 0.50    Years: 20.00    Total pack years: 10.00    Types: Cigarettes   Smokeless tobacco: Never   Tobacco comments:    She has been smoking since age 16, smokes half a pack daily,was doing one pack a day until last year   Vaping Use   Vaping Use: Never used  Substance Use Topics   Alcohol use: No   Drug use: No   Allergies Patient has no known allergies.  Review of Systems Review of Systems  Neurological:  Positive for syncope and light-headedness.    Physical Exam Vital Signs  I have reviewed the triage vital signs BP 95/60 (BP Location: Right Arm)   Pulse 68   Temp 98.1 F (36.7 C) (Oral)   Resp 19   Ht 5' 7"$  (1.702 m)   Wt 119.1 kg   SpO2 97%   BMI 41.12 kg/m   Physical Exam Vitals and nursing note reviewed.  Constitutional:      General: She is not in acute distress.    Appearance: She is well-developed. She is ill-appearing and diaphoretic.  HENT:     Head: Normocephalic and atraumatic.  Eyes:     Conjunctiva/sclera: Conjunctivae normal.  Cardiovascular:     Rate and Rhythm: Normal rate and regular rhythm.     Heart sounds: No murmur heard. Pulmonary:     Effort: Pulmonary effort is normal. No respiratory distress.     Breath sounds: Normal breath sounds.  Abdominal:     Palpations: Abdomen is soft.     Tenderness: There is no  abdominal tenderness.  Musculoskeletal:        General: No swelling.     Cervical back: Neck supple.  Skin:    General: Skin is warm.     Capillary Refill: Capillary refill takes less than 2 seconds.     Coloration: Skin is pale.  Neurological:     Mental Status: She is alert.  Psychiatric:        Mood and Affect: Mood normal.     ED Results and Treatments Labs (all labs ordered are listed, but only abnormal results are displayed) Labs Reviewed  CBC WITH DIFFERENTIAL/PLATELET - Abnormal; Notable for the following components:      Result Value  RBC 5.16 (*)    All other components within normal limits  CBG MONITORING, ED - Abnormal; Notable for the following components:   Glucose-Capillary 124 (*)    All other components within normal limits  COMPREHENSIVE METABOLIC PANEL  TROPONIN I (HIGH SENSITIVITY)                                                                                                                          Radiology No results found.  Pertinent labs & imaging results that were available during my care of the patient were reviewed by me and considered in my medical decision making (see MDM for details).  Medications Ordered in ED Medications  lactated ringers bolus 1,000 mL (1,000 mLs Intravenous New Bag/Given 08/10/22 [id number])                                                                                                                                     Procedures Procedures  (including critical care time)  Medical Decision Making / ED Course   This patient presents to the ED for concern of presyncope, this involves an extensive number of treatment options, and is a complaint that carries with it a high risk of complications and morbidity.  The differential diagnosis includes vasovagal presyncope, orthostatic presyncope, hypotension secondary to benzodiazepine use, cardiogenic presyncope, electrolyte abnormality, dehydration  MDM: Patient seen  emergency room for evaluation of presyncope.  Physical exam initially presenting with a pale appearing diaphoretic and clammy appearing patient but is otherwise unremarkable.  Laboratory evaluation with mild hypokalemia to 3.4 but is otherwise unremarkable.  High-sensitivity troponin is normal.  ECG with no evidence of dysrhythmia.  No WPW or Brugada.  Patient received 1 L lactated Ringer's and on reevaluation appears significantly improved.  After further discussion with the patient, it appears that she took a benzodiazepine prior to going to her procedure today as she was nervous about it and I am concerned that this may have negatively interacted with her blood pressure medications, dropping her blood pressure and causing presyncope today.  We discussed that in the future she may want to hold off on her blood pressure medicine and half the dose of the benzodiazepine if she can take this prior to her procedure again.  Patient had orthostatic vital signs performed in the emergency department after fluid resuscitation and she was asymptomatic throughout and blood pressures improved.  Patient then discharged with outpatient follow-up.   Additional history obtained: -Additional history obtained from husband -External records from outside source obtained and reviewed including: Chart review including previous notes, labs, imaging, consultation notes   Lab Tests: -I ordered, reviewed, and interpreted labs.   The pertinent results include:   Labs Reviewed  CBC WITH DIFFERENTIAL/PLATELET - Abnormal; Notable for the following components:      Result Value   RBC 5.16 (*)    All other components within normal limits  CBG MONITORING, ED - Abnormal; Notable for the following components:   Glucose-Capillary 124 (*)    All other components within normal limits  COMPREHENSIVE METABOLIC PANEL  TROPONIN I (HIGH SENSITIVITY)      EKG   EKG Interpretation  Date/Time:  Thursday August 10 2022 09:00:42  EST Ventricular Rate:  77 PR Interval:  173 QRS Duration: 82 QT Interval:  367 QTC Calculation: 416 R Axis:   13 Text Interpretation: Normal sinus rhythm Sinus rhythm Low voltage, precordial leads Confirmed by [place] ([id number]) on 08/10/2022 11:07:01 AM          Medicines ordered and prescription drug management: Meds ordered this encounter  Medications   lactated ringers bolus 1,000 mL    -I have reviewed the patients home medicines and have made adjustments as needed  Critical interventions none   Cardiac Monitoring: The patient was maintained on a cardiac monitor.  I personally viewed and interpreted the cardiac monitored which showed an underlying rhythm of: NSR  Social Determinants of Health:  Factors impacting patients care include: Took a benzodiazepine which is abnormal for her prior to procedure today   Reevaluation: After the interventions noted above, I reevaluated the patient and found that they have :improved  Co morbidities that complicate the patient evaluation  Past Medical History:  Diagnosis Date   Allergy    seasonal   Anxiety    Arthritis    deterioration of spine, knees   Carpal tunnel syndrome of right wrist    Clotting disorder ([place])    antiphospholipid syndrome   Depression    Encounter for general adult medical examination with abnormal findings 07/03/2022   Essential hypertension, benign 02/10/2020   Hyperlipidemia 08/02/2022   Sleep disorder 02/22/2016      Dispostion: I considered admission for this patient, but with symptoms improved, patient safe for discharge with outpatient follow-up     Final Clinical Impression(s) / ED Diagnoses Final diagnoses:  None     @PCDICTATION$ @    Teressa Lower, MD 08/10/22 1107

## 2022-08-10 NOTE — ED Notes (Signed)
Lip and finger nail beds blue when she came in diaphoretic  but now color has returned and she feels much better. Put on 2L O2.

## 2022-08-10 NOTE — Progress Notes (Signed)
PT tolerated left breast biopsy well today with NAD noted. PT verbalized understanding of discharge instructions. PT ambulated back to the mammogram area this time and given ice packs.

## 2022-08-10 NOTE — ED Triage Notes (Signed)
Pt had a breast biopsy done this am and she states they injected her with numbing medicine. She states she felt fine til she was leaving the hospital and she states she felt like she was going to pass out  Initial assessment, pt was in and out of consciousness, diaphoretic and cyanosis to lips  Pt denies any pain

## 2022-08-11 ENCOUNTER — Other Ambulatory Visit: Payer: Self-pay | Admitting: Internal Medicine

## 2022-08-11 DIAGNOSIS — R928 Other abnormal and inconclusive findings on diagnostic imaging of breast: Secondary | ICD-10-CM

## 2022-08-11 DIAGNOSIS — R921 Mammographic calcification found on diagnostic imaging of breast: Secondary | ICD-10-CM

## 2022-08-14 ENCOUNTER — Telehealth: Payer: Self-pay

## 2022-08-14 LAB — SURGICAL PATHOLOGY

## 2022-08-14 NOTE — Transitions of Care (Post Inpatient/ED Visit) (Signed)
   08/14/2022  Name: Diana Jensen MRN: [id number] DOB: 1975-12-31  Today's TOC FU Call Status: Today's TOC FU Call Status:: Successful TOC FU Call Competed TOC FU Call Complete Date: 08/14/22  Transition Care Management Follow-up Telephone Call Date of Discharge: 08/10/22 Discharge Facility: [facility] Type of Discharge: Emergency Department Reason for ED Visit: Other: (Near syncope) How have you been since you were released from the hospital?: Better Any questions or concerns?: No  Items Reviewed: Did you receive and understand the discharge instructions provided?: Yes Medications obtained and verified?: Yes (Medications Reviewed) Any new allergies since your discharge?: No Dietary orders reviewed?: NA Do you have support at home?: Yes  Home Care and Equipment/Supplies: [place] Ordered?: NA Any new equipment or medical supplies ordered?: NA  Functional Questionnaire: Do you need assistance with bathing/showering or dressing?: No Do you need assistance with meal preparation?: No Do you need assistance with eating?: No Do you have difficulty maintaining continence: No Do you need assistance with getting out of bed/getting out of a chair/moving?: No Do you have difficulty managing or taking your medications?: No  Folllow up appointments reviewed: PCP Follow-up appointment confirmed?: No (refused) Specialist Hospital Follow-up appointment confirmed?: NA Do you need transportation to your follow-up appointment?: No Do you understand care options if your condition(s) worsen?: Yes-patient verbalized understanding    [place] LPN [place] Direct Dial [phone]

## 2022-08-18 ENCOUNTER — Ambulatory Visit
Admission: RE | Admit: 2022-08-18 | Discharge: 2022-08-18 | Disposition: A | Discharge status: Home / Self Care | Payer: Managed Care, Other (non HMO) | Source: Ambulatory Visit | Attending: Internal Medicine | Admitting: Internal Medicine

## 2022-08-18 ENCOUNTER — Other Ambulatory Visit: Payer: Self-pay | Admitting: Body Imaging

## 2022-08-18 DIAGNOSIS — R921 Mammographic calcification found on diagnostic imaging of breast: Secondary | ICD-10-CM

## 2022-08-18 DIAGNOSIS — R928 Other abnormal and inconclusive findings on diagnostic imaging of breast: Secondary | ICD-10-CM

## 2022-08-18 HISTORY — PX: BREAST BIOPSY: SHX20

## 2022-08-22 NOTE — Progress Notes (Signed)
Referral received from mammogram and chart reviewed with Dr. Delton Coombes. Patient to proceed with appointment as scheduled with Dr. Constance Haw to discuss surgery and see Dr. Delton Coombes 4 weeks after surgery to discuss results. Dr. Delton Coombes requests Oncotype DX be sent on surgical specimen. I have called and discussed the above with the patient, to which she is agreeable. All questions addressed and answered to the patient's satisfaction. I have provided my contact information and encouraged the patient to call with questions or concerns.

## 2022-08-24 ENCOUNTER — Ambulatory Visit: Payer: Managed Care, Other (non HMO) | Admitting: Nutrition

## 2022-08-24 ENCOUNTER — Encounter: Payer: Self-pay | Admitting: General Surgery

## 2022-08-24 ENCOUNTER — Ambulatory Visit: Payer: Managed Care, Other (non HMO) | Admitting: General Surgery

## 2022-08-24 ENCOUNTER — Encounter: Payer: Self-pay | Admitting: Radiology

## 2022-08-24 VITALS — BP 136/97 | HR 73 | Temp 98.1°F | Resp 16 | Ht 67.0 in | Wt 265.0 lb

## 2022-08-24 DIAGNOSIS — Z170 Estrogen receptor positive status [ER+]: Secondary | ICD-10-CM | POA: Diagnosis not present

## 2022-08-24 DIAGNOSIS — C50112 Malignant neoplasm of central portion of left female breast: Secondary | ICD-10-CM | POA: Diagnosis not present

## 2022-08-24 NOTE — Patient Instructions (Addendum)
Total Mastectomy A total mastectomy and a modified radical mastectomy are surgeries that are done as part of treatment for breast cancer. Both types involve removing a breast. In a total mastectomy (simple mastectomy), all breast tissue including the nipple will be removed. These procedures may also be used to help prevent breast cancer. A preventive (prophylactic) mastectomy may be done if you are at an increased risk of breast cancer due to harmful changes (mutations) in certain genes, such as the BRCA genes. In that case, the procedure involves removing both of your breasts. This can reduce your risk of developing breast cancer in the future. For a transgender person, a total mastectomy may be done as part of a surgical transition from female to female. Let your health care provider know about: Any allergies you have. All medicines you are taking, including vitamins, herbs, eye drops, creams, and over-the-counter medicines. Any problems you or family members have had with anesthetic medicines. Any bleeding problems you have. Any surgeries you have had. Any medical conditions you have. Whether you are pregnant or may be pregnant. What are the risks? Generally, this is a safe procedure. However, problems may occur, including: Infection. Bleeding. Allergic reactions to medicines. Scar tissue. Chest numbness, sensation of throbbing, or tingling on the side of the surgery. Fluid buildup under the skin flaps where your breast was removed (seroma). Stress or sadness from losing your breast. If you have the lymph nodes under your arm removed, you may have arm swelling, weakness, or numbness on the same side of your body as your surgery. What happens before the procedure? Medicines Ask your health care provider about: Changing or stopping your regular medicines. This is especially important if you are taking diabetes medicines or blood thinners. Taking medicines such as aspirin and ibuprofen.  These medicines can thin your blood. Do not take these medicines unless your health care provider tells you to take them. Taking over-the-counter medicines, vitamins, herbs, and supplements. General instructions You may be checked for extra fluid around your lymph nodes (lymphedema). Do not use any products that contain nicotine or tobacco before the procedure. These products include cigarettes, chewing tobacco, and vaping devices, such as e-cigarettes. If you need help quitting, ask your health care provider. Ask your health care provider about: How your surgery site will be marked. What steps will be taken to help prevent infection. These steps may include: Removing hair at the surgery site. Washing skin with a germ-killing soap. Taking antibiotic medicine. What happens during the procedure? An IV will be inserted into one of your veins. You will be given: A medicine to help you relax (sedative). A medicine to make you fall asleep (general anesthetic). A wide incision will be made around your nipple. The skin of the breast and the nipple inside the incision will be removed along with all breast tissue. Lymph nodes under the arm on the side of the tumor will be checked to see if the cancer has spread. If you are having a modified radical mastectomy: The lining over your chest muscles will be removed. The incision may be extended to reach the lymph nodes under your arm, or a second incision may be made. Lymph nodes will be removed. Breast tissue and lymph nodes that are removed will be sent to the lab for testing. You may have a drainage tube inserted into your incision to collect fluid that builds up after surgery. This tube will be connected to a suction bulb on the outside of your  body to remove the fluid. Your incision or incisions will be closed with stitches (sutures), skin glue, or adhesive strips. A bandage (dressing) will be placed over your breast area. If lymph nodes were removed,  a dressing will also be placed under your arm. The procedure may vary among health care providers and hospitals. What happens after the procedure? Your blood pressure, heart rate, breathing rate, and blood oxygen level will be monitored until you leave the hospital or clinic. You will be given pain medicine as needed. Your IV can be removed when you are able to eat and drink. You may have a drainage tube in place for 2-3 days to prevent a collection of blood (hematoma) from developing in the breast area. You will be given instructions about caring for the drain before you go home. A pressure bandage may be applied for 1-2 days to prevent bleeding or swelling. Ask your health care provider how to care for your pressure bandage at home. Summary In a total mastectomy (simple mastectomy), all breast tissue including the nipple will be removed. In a modified radical mastectomy, lymph nodes under the arm will be removed along with the breast and nipple, and the chest wall lining. Before the procedure, follow instructions from your health care provider about eating and drinking, and ask about changing or stopping your regular medicines. You may have a drainage tube inserted into your incision to collect fluid that builds up after surgery. This tube will be connected to a suction bulb on the outside of your body to remove the fluid. This information is not intended to replace advice given to you by your health care provider. Make sure you discuss any questions you have with your health care provider.  Sentinel Lymph Node Biopsy  A sentinel lymph node biopsy is a procedure to identify, remove, and check a lymph node for cancer. Lymph is excess fluid from the tissues in your body. It is removed through the lymphatic system. This system is also a part of your body's defense system (immune system). The system includes lymph nodes and lymph vessels. Certain types of cancer can spread to nearby lymph nodes  through the lymphatic system. The cancer usually spreads to one lymph node first, and then to others. The first lymph node that the cancer could spread to is called the sentinel lymph node. In some cases, there may be more than one sentinel lymph node. You may have this procedure to determine whether your cancer has spread and to help your health care provider plan your treatment. If no cancer is found in the sentinel lymph node, it is very unlikely that the cancer has spread to any of your other lymph nodes. If cancer is found in the sentinel lymph node, additional lymph nodes may be removed for testing. Tell a health care provider about: Any allergies you have, including any history of problems with contrast dye. All medicines you are taking, including vitamins, herbs, eye drops, creams, and over-the-counter medicines. Any problems you or family members have had with anesthetic medicines. Any blood disorders you have. Any surgeries you have had. Any medical conditions you have. Whether you are pregnant or may be pregnant. What are the risks? Generally, this is a safe procedure. However, problems may occur, including: Infection. Bleeding. Allergic reaction to medicines or dyes. Staining of the skin where dye is injected. Damaged lymph vessels, causing a buildup of fluid (lymphedema). Pain or bruising at the biopsy site. What happens before the procedure? Medicines Ask  your health care provider about: Changing or stopping your regular medicines. This is especially important if you are taking diabetes medicines or blood thinners. Taking medicines such as aspirin and ibuprofen. These medicines can thin your blood. Do not take these medicines before your procedure unless your health care provider instructs you to do so. Taking over-the-counter medicines, vitamins, herbs, and supplements. Surgery safety Ask your health care provider: How your surgery site will be marked. What steps will be  taken to help prevent infection. These steps may include: Removing hair at the surgery site. Washing skin with a germ-killing soap. Receiving antibiotic medicine. General instructions Do not use any products that contain nicotine or tobacco for at least 2 weeks before the procedure. These products include cigarettes, chewing tobacco, and vaping devices, such as e-cigarettes. If you need help quitting, ask your health care provider. You may have blood tests to make sure your blood clots normally. Plan to have a responsible adult take you home from the hospital or clinic. What happens during the procedure?  An IV will be inserted into one of your veins. You will be given one or more of the following: A medicine to help you relax (sedative). A medicine to numb the area (local anesthetic). A medicine to make you fall asleep (general anesthetic). A dye injected into the breast. The dye will follow the same path that a spreading cancer would likely follow. The  dye will reach your lymph nodes quickly. It may be given just before surgery. Sentinel lymph nodes will be removed and sent to a lab for testing. If no cancer is found, no other lymph nodes will be removed. It is unlikely the cancer has spread. If cancer is found, the surgeon will remove other lymph nodes for testing. This may happen during the same procedure or at a later time. The incision will be closed with stitches (sutures) or skin glue. A small dressing may be taped over the incision area. The procedure may vary among health care providers and hospitals. What happens after the procedure? Your blood pressure, heart rate, breathing rate, and blood oxygen level will be monitored until you leave the hospital or clinic. Your urine or stool may be blue for the next 24-48 hours. This is normal. It is caused by the dye that is used during the procedure. If you were given a sedative during the procedure, it can affect you for several hours.  Do not drive or operate machinery until your health care provider says that it is safe. It is up to you to get the results of your procedure. Ask your health care provider, or the department that is doing the procedure, when your results will be ready. Summary A sentinel lymph node biopsy is a procedure to identify, remove, and examine one or more lymph nodes for cancer. If you have cancer, you may have this procedure to determine whether your cancer has spread. If no cancer is found in the sentinel lymph node, it is very unlikely that the cancer has spread to any other lymph nodes. If cancer is found in the sentinel lymph node, your surgeon may remove additional lymph nodes for testing. This information is not intended to replace advice given to you by your health care provider. Make sure you discuss any questions you have with your health care provider. Document Revised: 03/25/2020 Document Reviewed: 03/25/2020 [employer] Patient Education  [place].

## 2022-08-24 NOTE — Progress Notes (Signed)
[facility] History and Physical  Reason for Referral: Breast cancer, left. Referring Physician: Johnette Abraham, MD   Chief Complaint   New Patient (Initial Visit)     ADELENE HEHIR is a 47 y.o. female.  HPI: Ms. 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 This all started in December when she felt an area around her left breast at the 9 oclock position. She had a mammogram and then was worked up with diagnostic and an US guided biopsy followed by a stereotactic biopsy.  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  This spans over 7cm per the last documented report and there is an area superficial to the cancer that  they are also concerned about but was not biopsied.  The patient has no history of any masses, lumps, bumps, nipple changes or discharge. She had menarche at age 44, and her first pregnancy at age 29. She has a history of any family breast cancer in her mom in her 3s that was was Stage III she reports and a paternal grandmother with colon cancer. She has never had any previous biopsies or concerning areas on mammogram prior to this episode.  She has not had any chest radiation.  She took lovenox during pregnancy for concern for pregnancy related antiphospholipid syndrome.    Past Medical History:  Diagnosis Date   Allergy    seasonal   Anxiety    Arthritis    deterioration of spine, knees   Carpal tunnel syndrome of right wrist    Clotting disorder (HCC)    antiphospholipid syndrome   Depression    Encounter for general adult medical examination with abnormal findings 07/03/2022   Essential hypertension, benign 02/10/2020   Hyperlipidemia 08/02/2022   Sleep disorder 02/22/2016    Past Surgical History:  Procedure Laterality Date   BREAST BIOPSY Left 08/10/2022   [place] LT BREAST BX W LOC DEV 1ST LESION IMG BX SPEC US GUIDE 08/10/2022 [facility]   BREAST BIOPSY Left  08/18/2022   MM LT BREAST BX W LOC DEV EA AD LESION IMG BX SPEC STEREO GUIDE 08/18/2022 [facility]   BREAST BIOPSY Left 08/18/2022   MM LT BREAST BX W LOC DEV 1ST LESION IMAGE BX SPEC STEREO GUIDE 08/18/2022 [facility]   CARPAL TUNNEL RELEASE Right 01/09/2019   Procedure: RIGHT CARPAL TUNNEL RELEASE;  Surgeon: Carole Civil, MD;  Location: [facility];  Service: Orthopedics;  Laterality: Right;   DILATION AND CURETTAGE OF UTERUS     x2   EYE SURGERY     lazy eye   TOTAL ABDOMINAL HYSTERECTOMY  2011    Family History  Problem Relation Age of Onset   Cancer Mother        breast    Arthritis Mother    Hypertension Mother    Osteoporosis Mother    Thyroid disease Father    Hypertension Father    Mental illness Sister        depression ? bipolar   Mental illness Daughter    Dementia Maternal Grandmother    Cancer Maternal Grandfather    Heart disease Paternal Grandmother    Cancer Paternal Grandfather    Colon cancer Paternal Grandfather    Cancer - Lung Neg Hx    Cervical cancer Neg Hx     Social History   Tobacco Use   Smoking status: Every Day  Packs/day: 0.50    Years: 20.00    Total pack years: 10.00    Types: Cigarettes   Smokeless tobacco: Never   Tobacco comments:    She has been smoking since age 59, smokes half a pack daily,was doing one pack a day until last year   Vaping Use   Vaping Use: Never used  Substance Use Topics   Alcohol use: No   Drug use: No    Medications: I have reviewed the patient's current medications. Allergies as of 08/24/2022   No Known Allergies      Medication List        Accurate as of August 24, 2022  2:21 PM. If you have any questions, ask your nurse or doctor.          escitalopram 10 MG tablet Commonly known as: Lexapro Take 1 tablet (10 mg total) by mouth daily.   hydrOXYzine 50 MG capsule Commonly known as: VISTARIL Take 1 capsule (50 mg total) by mouth daily as needed for anxiety.    ibuprofen 600 MG tablet Commonly known as: ADVIL Take 1 tablet (600 mg total) by mouth 2 (two) times daily as needed.   losartan 25 MG tablet Commonly known as: COZAAR Take 1 tablet (25 mg total) by mouth daily.   Vitamin D (Ergocalciferol) 1.25 MG (50000 UNIT) Caps capsule Commonly known as: DRISDOL Take 1 capsule (50,000 Units total) by mouth every 7 (seven) days for 12 doses.         ROS:  A comprehensive review of systems was negative except for: Integument/breast: positive for bruising at the breast on left Musculoskeletal: positive for neck pain  Blood pressure (!) 136/97, pulse 73, temperature 98.1 F (36.7 C), temperature source Oral, resp. rate 16, height '5\' 7"'$  (1.702 m), weight 265 lb (120.2 kg), SpO2 97 %. Physical Exam Vitals reviewed.  HENT:     Head: Normocephalic.     Nose: Nose normal.     Mouth/Throat:     Mouth: Mucous membranes are moist.  Eyes:     Extraocular Movements: Extraocular movements intact.  Cardiovascular:     Rate and Rhythm: Normal rate.  Pulmonary:     Effort: Pulmonary effort is normal.     Breath sounds: Normal breath sounds.  Chest:  Breasts:    Right: No inverted nipple, mass, nipple discharge or skin change.     Left: Inverted nipple, mass, skin change and tenderness present. No nipple discharge.     Comments: Left breast with bruising, some retraction of nipple at 9 oclock position, induration/ palpable area felt 9 clock, right breast normal  Abdominal:     General: There is no distension.     Palpations: Abdomen is soft.     Tenderness: There is no abdominal tenderness.  Musculoskeletal:        General: Normal range of motion.  Lymphadenopathy:     Upper Body:     Right upper body: No supraclavicular or axillary adenopathy.     Left upper body: No supraclavicular or axillary adenopathy.  Skin:    General: Skin is warm.  Neurological:     General: No focal deficit present.     Mental Status: She is alert and oriented to  person, place, and time.  Psychiatric:        Mood and Affect: Mood normal.        Behavior: Behavior normal.        Thought Content: Thought content normal.  Results:  CLINICAL DATA:  Mass felt by the patient in the anterior left breast for the past 2 months.   EXAM: DIGITAL DIAGNOSTIC UNILATERAL LEFT MAMMOGRAM WITH TOMOSYNTHESIS; ULTRASOUND LEFT BREAST LIMITED   TECHNIQUE: Left digital diagnostic mammography and breast tomosynthesis was performed.; Targeted ultrasound examination of the left breast was performed.   COMPARISON:  Previous exam(s).   ACR Breast Density Category b: There are scattered areas of fibroglandular density.   FINDINGS: Is an irregular, spiculated mass with calcifications in the anterior left breast at the location of the mass felt by the patient, marked with a metallic marker. The calcifications extend toward the skin and the entire area encompasses 3.7 x 2.3 cm in the craniocaudal projection and 1.5 cm in height in the oblique projection.   On physical exam, the patient has an approximately 1 cm rounded, faintly palpable mass in the 9 o'clock retroareolar left breast.   Targeted ultrasound is performed, showing a vertically oriented irregular hypoechoic mass with ill-defined surrounding increased echogenicity in the 9 o'clock retroareolar left breast. This measures 1.9 x 1.6 x 1.4 cm in maximum dimensions including fingers of tissue extending to the nipple in the area of calcifications extending to the nipple on the mammogram and extending away from the nipple.   Ultrasound of the left axilla demonstrated no enlarged left axillary lymph nodes.   IMPRESSION: 1.9 cm mass in the 9 o'clock retroareolar left breast with associated mammographic spiculations and calcifications spanning 3.7 x 2.3 x 1.5 cm. This has imaging features highly suspicious for malignancy.   RECOMMENDATION: Ultrasound-guided core needle biopsy of the 1.9 cm mass in the 9  o'clock retroareolar left breast. This has been discussed with the patient and her husband and scheduled at 8 a.m. on 08/10/2022.   I have discussed the findings and recommendations with the patient. If applicable, a reminder letter will be sent to the patient regarding the next appointment.   BI-RADS CATEGORY  5: Highly suggestive of malignancy.     Electronically Signed   By: Claudie Revering M.D.   On: 07/27/2022 15:57    ADDENDUM REPORT: 08/11/2022 11:56   ADDENDUM: PATHOLOGY revealed: A. LEFT BREAST, SUBAREOLAR MASS @ 9:00, BIOPSY: Invasive moderately differentiated ductal adenocarcinoma, grade 2. 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 Overall grade: Grade 2. Negative for angiolymphatic invasion. Microcalcifications present within DCIS. Negative for angiolymphatic invasion. Tumor measures 9 mm in greatest linear extent.   Pathology results are CONCORDANT with imaging findings, per Dr. Everlean Alstrom.   Pathology results and recommendations were discussed with patient via telephone on 08/11/2022. Patient reported biopsy site doing well with no adverse symptoms, and only slight tenderness at the site. Post biopsy care instructions were reviewed, questions were answered and my direct phone number was provided. Patient was instructed to call [facility] Mammography Department for any additional questions or concerns related to biopsy site.   RECOMMENDATIONS: 1. Please schedule patient for TWO additional stereotactic guided LEFT breast biopsies at the [facility], to include calcifications located superior and posterior to the biopsied mass. Request sent to [facility] by Electa Sniff RN on 08/11/2022.   2. Surgical consultation to be scheduled for after the two additional biopsies are performed. Patient aware of additional biopsies and need for surgical  consultation. Request for oncological consultation sent to Kathi Der RT at [facility] Mammography Department on 08/11/2022 by Electa Sniff  RN.   3. Consider bilateral breast MRI to evaluate extent of breast disease given additional areas of concern and patient's relatively young age.   Pathology results reported by Electa Sniff RN on 08/11/2022.     Electronically Signed   By: Everlean Alstrom M.D.   On: 08/11/2022 11:56       CLINICAL DATA:  47 year old female presents for ultrasound-guided core biopsy of a 1.9 cm mass in the left breast at 9 o'clock retroareolar.   EXAM: ULTRASOUND GUIDED LEFT BREAST CORE NEEDLE BIOPSY   COMPARISON:  Previous exam(s).   PROCEDURE: I met with the patient and we discussed the procedure of ultrasound-guided biopsy, including benefits and alternatives. We discussed the high likelihood of a successful procedure. We discussed the risks of the procedure, including infection, bleeding, tissue injury, clip migration, and inadequate sampling. Informed written consent was given. The usual time-out protocol was performed immediately prior to the procedure.   Lesion quadrant: Lower inner   Using sterile technique and 1% Lidocaine as local anesthetic, under direct ultrasound visualization, a 14 gauge spring-loaded device was used to perform biopsy of the mass in the left breast at 9 o'clock retroareolar using a medial to lateral approach. At the conclusion of the procedure a ribbon shaped tissue marker clip was deployed into the biopsy cavity. Follow up 2 view mammogram was performed and dictated separately.   IMPRESSION: Ultrasound guided biopsy of the mass in the left breast at 9 o'clock retroareolar. No apparent complications.   Electronically Signed: By: Everlean Alstrom M.D. On: 08/10/2022 08:41  ADDENDUM REPORT: 08/22/2022 14:05   ADDENDUM: Pathology revealed DUCTAL CARCINOMA IN SITU, INTERMEDIATE NUCLEAR GRADE,  CRIBRIFORM AND PAPILLARY TYPES WITH  NECROSIS- NEGATIVE FOR INVASIVE CARCINOMA, MICROCALCIFICATIONS PRESENT WITHIN DCIS of the LEFT breast, central mid depth, (coil clip). This was found to be concordant by Dr. Ammie Ferrier.   Pathology revealed DUCTAL CARCINOMA IN SITU, INTERMEDIATE NUCLEAR GRADE, CRIBRIFORM AND PAPILLARY TYPES WITH   NECROSIS- NEGATIVE FOR INVASIVE CARCINOMA, MICROCALCIFICATIONS PRESENT WITHIN DCIS, FIBROCYSTIC CHANGES INCLUDING ADENOSIS of the LEFT breast, medial anterior, (X clip). This was found to be concordant by Dr. Ammie Ferrier.   Pathology results were discussed with the patient by telephone. The patient reported doing well after the biopsies with tenderness at the sites. Post biopsy instructions and care were reviewed and questions were answered. The patient was encouraged to call [facility] for any additional concerns.   The patient has a recent diagnosis of LEFT breast cancer and should follow her outlined treatment plan.   NOTE: If lumpectomy elected, we will need to also localize the additional calcifications in the anterior left breast (superior to the known breast cancer), that were not biopsied.   Pathology results reported by Stacie Acres RN on 08/22/2022.     Electronically Signed   By: Ammie Ferrier M.D.   On: 08/22/2022 14:05  CLINICAL DATA:  47 year old female presenting for stereotactic biopsy of 2 groups of calcifications in the left breast.   EXAM: LEFT BREAST STEREOTACTIC CORE NEEDLE BIOPSY   COMPARISON:  Previous exam(s).   FINDINGS: Prior to the procedure, spot compression magnification images over the anterior left breast were performed for evaluation of the calcifications recommended for biopsy. There are multiple groups of calcifications in a segmental orientations spanning approximately 7.2 x 3.2 cm. While preparing for the biopsy, the most anterior calcifications which are superior to the  biopsy-proven malignancy in the retroareolar left breast were found to be  too close to the skin surface for stereotactic sampling. Therefore 2 sites posterior to the biopsy marking clip were sampled instead.   The patient and I discussed the procedure of stereotactic-guided biopsy including benefits and alternatives. We discussed the high likelihood of a successful procedure. We discussed the risks of the procedure including infection, bleeding, tissue injury, clip migration, and inadequate sampling. Informed written consent was given. The usual time out protocol was performed immediately prior to the procedure.   Using ChloraPrep, sterile technique and 1% Lidocaine with epinephrine as the local anesthetic, under stereotactic guidance, a 9 gauge vacuum assisted device was used to perform core needle biopsy of calcifications in the central mid depth of the left breast using a medial approach. Specimen radiograph was performed showing calcifications in multiple core samples. Specimens with calcifications are identified for pathology.   Lesion quadrant: Upper inner quadrant   At the conclusion of the procedure, a coil shaped tissue marker clip was deployed into the biopsy cavity.   Using ChloraPrep, sterile technique and 1% Lidocaine with epinephrine as the local anesthetic, under stereotactic guidance, a 9 gauge vacuum assisted device was used to perform core needle biopsy of calcifications in the medial anterior left breast using a medial approach. Specimen radiograph was performed showing calcifications in multiple core samples. Specimens with calcifications are identified for pathology.   Lesion quadrant: Upper inner quadrant   At the conclusion of the procedure, an X shaped tissue marker clip was deployed into the biopsy cavity. Follow-up 2-view mammogram was performed and dictated separately.   IMPRESSION: 1. Stereotactic-guided biopsy of calcifications in the central mid depth of the left  breast (coil clip). No apparent complications.   2. Stereotactic calcifications in the medial anterior left breast (X clip). No apparent complications.   Electronically Signed: By: Ammie Ferrier M.D. On: 08/18/2022 10:04     Assessment & Plan:  ANOOP AMEND is a 47 y.o. female with an invasive cancer and DCIS surrounding it with the areas spanning over 7 cm in size.   We discussed mastectomy versus MRI and possible partial but her breast size and the size of this area may preclude that attempt.    We have discussed that there is no difference in the prognosis or chance or recurrence or differences in survival between the two options. We have discussed the need for radiation with the lumpectomy, and we have discussed that she will be referred to oncology after our procedure to further discuss her options for chemotherapy and hormonal therapy if she qualifies.   We discussed dye injection with Magtrace and sentinel node biopsy.  We have discussed that the sentinel node biopsy tells [place] if the cancer has spread to the lymph nodes, and can help with plans for chemotherapy treatment and overall prognosis.    We have discussed that these are big discussions, and that the risk from the operations are similar including risk of bleeding, risk of infection, and risk of needing additional surgeries. We have discussed the likely need for an overnight stay with a mastectomy and a drain that will remain in place for about 1-2 week.      All questions were answered to the satisfaction of the patient and family.  Discussed her pregnancy use of lovneox due to antiphospholipid syndrome with Dr. Delton Coombes who recommended lovenox preoperatively.   She will get admitted for the mastectomy and get PT to work with her before discharge.    Virl Cagey 08/24/2022, 2:21 PM

## 2022-08-25 ENCOUNTER — Other Ambulatory Visit: Payer: Self-pay

## 2022-08-25 ENCOUNTER — Encounter (HOSPITAL_COMMUNITY)
Admission: RE | Admit: 2022-08-25 | Discharge: 2022-08-25 | Disposition: A | Discharge status: Home / Self Care | Payer: Managed Care, Other (non HMO) | Source: Ambulatory Visit | Attending: General Surgery | Admitting: General Surgery

## 2022-08-25 ENCOUNTER — Encounter (HOSPITAL_COMMUNITY): Payer: Self-pay

## 2022-08-28 ENCOUNTER — Encounter (HOSPITAL_COMMUNITY): Payer: Self-pay | Admitting: General Surgery

## 2022-08-28 ENCOUNTER — Other Ambulatory Visit: Payer: Self-pay

## 2022-08-28 ENCOUNTER — Ambulatory Visit (HOSPITAL_BASED_OUTPATIENT_CLINIC_OR_DEPARTMENT_OTHER): Payer: Managed Care, Other (non HMO) | Admitting: Certified Registered Nurse Anesthetist

## 2022-08-28 ENCOUNTER — Observation Stay (HOSPITAL_COMMUNITY)
Admission: RE | Admit: 2022-08-28 | Discharge: 2022-08-30 | Disposition: A | Discharge status: Home / Self Care | Payer: Managed Care, Other (non HMO) | Attending: General Surgery | Admitting: General Surgery

## 2022-08-28 ENCOUNTER — Observation Stay (HOSPITAL_COMMUNITY): Payer: Managed Care, Other (non HMO)

## 2022-08-28 ENCOUNTER — Ambulatory Visit (HOSPITAL_COMMUNITY): Payer: Managed Care, Other (non HMO) | Admitting: Certified Registered Nurse Anesthetist

## 2022-08-28 ENCOUNTER — Encounter (HOSPITAL_COMMUNITY)
Admission: RE | Disposition: A | Discharge status: Home / Self Care | Payer: Self-pay | Source: Home / Self Care | Attending: General Surgery

## 2022-08-28 DIAGNOSIS — I10 Essential (primary) hypertension: Secondary | ICD-10-CM | POA: Diagnosis not present

## 2022-08-28 DIAGNOSIS — D0512 Intraductal carcinoma in situ of left breast: Secondary | ICD-10-CM | POA: Diagnosis not present

## 2022-08-28 DIAGNOSIS — C50919 Malignant neoplasm of unspecified site of unspecified female breast: Secondary | ICD-10-CM | POA: Diagnosis present

## 2022-08-28 DIAGNOSIS — C50112 Malignant neoplasm of central portion of left female breast: Principal | ICD-10-CM

## 2022-08-28 DIAGNOSIS — Z170 Estrogen receptor positive status [ER+]: Secondary | ICD-10-CM

## 2022-08-28 DIAGNOSIS — F17210 Nicotine dependence, cigarettes, uncomplicated: Secondary | ICD-10-CM

## 2022-08-28 DIAGNOSIS — C50912 Malignant neoplasm of unspecified site of left female breast: Secondary | ICD-10-CM | POA: Diagnosis not present

## 2022-08-28 HISTORY — PX: SIMPLE MASTECTOMY WITH AXILLARY SENTINEL NODE BIOPSY: SHX6098

## 2022-08-28 LAB — BLOOD GAS, ARTERIAL
Acid-base deficit: 0.2 mmol/L (ref 0.0–2.0)
Bicarbonate: 24 mmol/L (ref 20.0–28.0)
Drawn by: 35043
FIO2: 40 %
O2 Saturation: 98.7 %
Patient temperature: 36.8
pCO2 arterial: 37 mmHg (ref 32–48)
pH, Arterial: 7.42 (ref 7.35–7.45)
pO2, Arterial: 104 mmHg (ref 83–108)

## 2022-08-28 SURGERY — SIMPLE MASTECTOMY WITH AXILLARY SENTINEL NODE BIOPSY
Anesthesia: General | Site: Breast | Laterality: Left

## 2022-08-28 MED ORDER — FENTANYL CITRATE PF 50 MCG/ML IJ SOSY
25.0000 ug | PREFILLED_SYRINGE | INTRAMUSCULAR | Status: DC | PRN
  Administered 2022-08-28: 50 ug via INTRAVENOUS
  Filled 2022-08-28: qty 1

## 2022-08-28 MED ORDER — BUPIVACAINE LIPOSOME 1.3 % IJ SUSP
INTRAMUSCULAR | Status: DC | PRN
  Administered 2022-08-28: 50 mL

## 2022-08-28 MED ORDER — PROPOFOL 10 MG/ML IV BOLUS
INTRAVENOUS | Status: DC | PRN
  Administered 2022-08-28: 200 mg via INTRAVENOUS
  Administered 2022-08-28: 30 mg via INTRAVENOUS

## 2022-08-28 MED ORDER — DOCUSATE SODIUM 100 MG PO CAPS
100.0000 mg | ORAL_CAPSULE | Freq: Two times a day (BID) | ORAL | Status: DC
  Administered 2022-08-28 – 2022-08-30 (×4): 100 mg via ORAL
  Filled 2022-08-28 (×4): qty 1

## 2022-08-28 MED ORDER — SUCCINYLCHOLINE CHLORIDE 200 MG/10ML IV SOSY
PREFILLED_SYRINGE | INTRAVENOUS | Status: DC | PRN
  Administered 2022-08-28: 180 mg via INTRAVENOUS

## 2022-08-28 MED ORDER — MIDAZOLAM HCL 2 MG/2ML IJ SOLN
INTRAMUSCULAR | Status: DC | PRN
  Administered 2022-08-28: 2 mg via INTRAVENOUS

## 2022-08-28 MED ORDER — FENTANYL CITRATE (PF) 100 MCG/2ML IJ SOLN
INTRAMUSCULAR | Status: DC | PRN
  Administered 2022-08-28: 50 ug via INTRAVENOUS
  Administered 2022-08-28: 100 ug via INTRAVENOUS
  Administered 2022-08-28: 50 ug via INTRAVENOUS

## 2022-08-28 MED ORDER — MORPHINE SULFATE (PF) 2 MG/ML IV SOLN: 2.0000 mg | INTRAVENOUS | Status: DC | PRN

## 2022-08-28 MED ORDER — METOPROLOL TARTRATE 5 MG/5ML IV SOLN: 5.0000 mg | Freq: Four times a day (QID) | INTRAVENOUS | Status: DC | PRN

## 2022-08-28 MED ORDER — DEXMEDETOMIDINE HCL IN NACL 80 MCG/20ML IV SOLN
INTRAVENOUS | Status: AC
  Filled 2022-08-28: qty 20

## 2022-08-28 MED ORDER — CHLORHEXIDINE GLUCONATE CLOTH 2 % EX PADS
6.0000 | MEDICATED_PAD | Freq: Once | CUTANEOUS | Status: DC
  Administered 2022-08-28: 6 via TOPICAL

## 2022-08-28 MED ORDER — MAGTRACE LYMPHATIC TRACER
INTRAMUSCULAR | Status: DC | PRN
  Administered 2022-08-28: 2 mL via INTRAMUSCULAR

## 2022-08-28 MED ORDER — FENTANYL CITRATE (PF) 100 MCG/2ML IJ SOLN
INTRAMUSCULAR | Status: AC
  Filled 2022-08-28: qty 2

## 2022-08-28 MED ORDER — ONDANSETRON 4 MG PO TBDP: 4.0000 mg | ORAL_TABLET | Freq: Four times a day (QID) | ORAL | Status: DC | PRN

## 2022-08-28 MED ORDER — LACTATED RINGERS IV SOLN: INTRAVENOUS | Status: DC | PRN

## 2022-08-28 MED ORDER — CEFAZOLIN IN SODIUM CHLORIDE 3-0.9 GM/100ML-% IV SOLN
INTRAVENOUS | Status: AC
  Filled 2022-08-28: qty 100

## 2022-08-28 MED ORDER — EPHEDRINE 5 MG/ML INJ
INTRAVENOUS | Status: AC
  Filled 2022-08-28: qty 5

## 2022-08-28 MED ORDER — CHLORHEXIDINE GLUCONATE CLOTH 2 % EX PADS: 6.0000 | MEDICATED_PAD | Freq: Once | CUTANEOUS | Status: DC

## 2022-08-28 MED ORDER — OXYCODONE HCL 5 MG/5ML PO SOLN: 5.0000 mg | Freq: Once | ORAL | Status: AC | PRN

## 2022-08-28 MED ORDER — SUGAMMADEX SODIUM 200 MG/2ML IV SOLN
INTRAVENOUS | Status: DC | PRN
  Administered 2022-08-28: 300 mg via INTRAVENOUS

## 2022-08-28 MED ORDER — ONDANSETRON HCL 4 MG/2ML IJ SOLN: 4.0000 mg | Freq: Once | INTRAMUSCULAR | Status: DC | PRN

## 2022-08-28 MED ORDER — PROPOFOL 10 MG/ML IV BOLUS
INTRAVENOUS | Status: AC
  Filled 2022-08-28: qty 20

## 2022-08-28 MED ORDER — DEXAMETHASONE SODIUM PHOSPHATE 10 MG/ML IJ SOLN
INTRAMUSCULAR | Status: DC | PRN
  Administered 2022-08-28: 10 mg via INTRAVENOUS

## 2022-08-28 MED ORDER — OXYCODONE HCL 5 MG PO TABS
5.0000 mg | ORAL_TABLET | ORAL | Status: DC | PRN
  Administered 2022-08-29 – 2022-08-30 (×2): 5 mg via ORAL
  Filled 2022-08-28: qty 2
  Filled 2022-08-28 (×2): qty 1

## 2022-08-28 MED ORDER — ENOXAPARIN SODIUM 40 MG/0.4ML IJ SOSY: 40.0000 mg | PREFILLED_SYRINGE | Freq: Once | INTRAMUSCULAR | Status: AC

## 2022-08-28 MED ORDER — CEFAZOLIN IN SODIUM CHLORIDE 3-0.9 GM/100ML-% IV SOLN
3.0000 g | INTRAVENOUS | Status: AC
  Administered 2022-08-28: 3 g via INTRAVENOUS

## 2022-08-28 MED ORDER — 0.9 % SODIUM CHLORIDE (POUR BTL) OPTIME
TOPICAL | Status: DC | PRN
  Administered 2022-08-28: 1000 mL

## 2022-08-28 MED ORDER — LIDOCAINE HCL (CARDIAC) PF 100 MG/5ML IV SOSY
PREFILLED_SYRINGE | INTRAVENOUS | Status: DC | PRN
  Administered 2022-08-28: 60 mg via INTRATRACHEAL

## 2022-08-28 MED ORDER — OXYCODONE HCL 5 MG PO TABS
5.0000 mg | ORAL_TABLET | Freq: Once | ORAL | Status: AC | PRN
  Administered 2022-08-28: 5 mg via ORAL
  Filled 2022-08-28: qty 1

## 2022-08-28 MED ORDER — BUPIVACAINE HCL (PF) 0.5 % IJ SOLN
INTRAMUSCULAR | Status: AC
  Filled 2022-08-28: qty 30

## 2022-08-28 MED ORDER — ACETAMINOPHEN 500 MG PO TABS
1000.0000 mg | ORAL_TABLET | Freq: Four times a day (QID) | ORAL | Status: DC
  Administered 2022-08-28 – 2022-08-30 (×8): 1000 mg via ORAL
  Filled 2022-08-28 (×8): qty 2

## 2022-08-28 MED ORDER — HYDROXYZINE PAMOATE 50 MG PO CAPS: 50.0000 mg | ORAL_CAPSULE | Freq: Every day | ORAL | Status: DC | PRN

## 2022-08-28 MED ORDER — DIPHENHYDRAMINE HCL 12.5 MG/5ML PO ELIX: 12.5000 mg | ORAL_SOLUTION | Freq: Four times a day (QID) | ORAL | Status: DC | PRN

## 2022-08-28 MED ORDER — BUPIVACAINE LIPOSOME 1.3 % IJ SUSP
INTRAMUSCULAR | Status: AC
  Filled 2022-08-28: qty 20

## 2022-08-28 MED ORDER — DIPHENHYDRAMINE HCL 50 MG/ML IJ SOLN: 12.5000 mg | Freq: Four times a day (QID) | INTRAMUSCULAR | Status: DC | PRN

## 2022-08-28 MED ORDER — MIDAZOLAM HCL 2 MG/2ML IJ SOLN
INTRAMUSCULAR | Status: AC
  Filled 2022-08-28: qty 2

## 2022-08-28 MED ORDER — ENOXAPARIN SODIUM 40 MG/0.4ML IJ SOSY
PREFILLED_SYRINGE | INTRAMUSCULAR | Status: AC
  Administered 2022-08-28: 40 mg via SUBCUTANEOUS
  Filled 2022-08-28: qty 0.4

## 2022-08-28 MED ORDER — MELATONIN 3 MG PO TABS
3.0000 mg | ORAL_TABLET | Freq: Every evening | ORAL | Status: DC | PRN
  Filled 2022-08-28: qty 1

## 2022-08-28 MED ORDER — SODIUM CHLORIDE 0.9 % IV BOLUS
500.0000 mL | Freq: Once | INTRAVENOUS | Status: AC
  Administered 2022-08-28: 500 mL via INTRAVENOUS

## 2022-08-28 MED ORDER — ENOXAPARIN SODIUM 40 MG/0.4ML IJ SOSY
40.0000 mg | PREFILLED_SYRINGE | INTRAMUSCULAR | Status: DC
  Filled 2022-08-28: qty 0.4

## 2022-08-28 MED ORDER — ESCITALOPRAM OXALATE 10 MG PO TABS
10.0000 mg | ORAL_TABLET | Freq: Every day | ORAL | Status: DC
  Administered 2022-08-28 – 2022-08-30 (×3): 10 mg via ORAL
  Filled 2022-08-28 (×3): qty 1

## 2022-08-28 MED ORDER — ONDANSETRON HCL 4 MG/2ML IJ SOLN
INTRAMUSCULAR | Status: DC | PRN
  Administered 2022-08-28: 4 mg via INTRAVENOUS

## 2022-08-28 MED ORDER — ROCURONIUM BROMIDE 10 MG/ML (PF) SYRINGE
PREFILLED_SYRINGE | INTRAVENOUS | Status: DC | PRN
  Administered 2022-08-28 (×2): 20 mg via INTRAVENOUS
  Administered 2022-08-28: 50 mg via INTRAVENOUS

## 2022-08-28 MED ORDER — DEXMEDETOMIDINE HCL IN NACL 80 MCG/20ML IV SOLN
INTRAVENOUS | Status: DC | PRN
  Administered 2022-08-28: 4 ug via BUCCAL
  Administered 2022-08-28 (×3): 8 ug via BUCCAL

## 2022-08-28 MED ORDER — LOSARTAN POTASSIUM 50 MG PO TABS
25.0000 mg | ORAL_TABLET | Freq: Every day | ORAL | Status: DC
  Administered 2022-08-28: 25 mg via ORAL
  Filled 2022-08-28 (×3): qty 1

## 2022-08-28 MED ORDER — CHLORHEXIDINE GLUCONATE 0.12 % MT SOLN
OROMUCOSAL | Status: AC
  Administered 2022-08-28: 15 mL
  Filled 2022-08-28: qty 15

## 2022-08-28 MED ORDER — ONDANSETRON HCL 4 MG/2ML IJ SOLN: 4.0000 mg | Freq: Four times a day (QID) | INTRAMUSCULAR | Status: DC | PRN

## 2022-08-28 MED ORDER — SIMETHICONE 80 MG PO CHEW: 40.0000 mg | CHEWABLE_TABLET | Freq: Four times a day (QID) | ORAL | Status: DC | PRN

## 2022-08-28 MED ORDER — EPHEDRINE SULFATE (PRESSORS) 50 MG/ML IJ SOLN
INTRAMUSCULAR | Status: DC | PRN
  Administered 2022-08-28 (×2): 5 mg via INTRAVENOUS

## 2022-08-28 SURGICAL SUPPLY — 48 items
APL PRP STRL LF DISP 70% ISPRP (MISCELLANEOUS) ×1
APPLIER CLIP 9.375 SM OPEN (CLIP) ×1
APR CLP SM 9.3 20 MLT OPN (CLIP) ×1
BINDER BREAST XLRG (GAUZE/BANDAGES/DRESSINGS) IMPLANT
CHLORAPREP W/TINT 26 (MISCELLANEOUS) ×2 IMPLANT
CLIP APPLIE 9.375 SM OPEN (CLIP) IMPLANT
CLOTH BEACON ORANGE TIMEOUT ST (SAFETY) ×2 IMPLANT
COVER LIGHT HANDLE STERIS (MISCELLANEOUS) ×4 IMPLANT
COVER PROBE U/S 5X48 (MISCELLANEOUS) IMPLANT
ELECT REM PT RETURN 9FT ADLT (ELECTROSURGICAL) ×1
ELECTRODE REM PT RTRN 9FT ADLT (ELECTROSURGICAL) ×2 IMPLANT
EVACUATOR DRAINAGE 10X20 100CC (DRAIN) ×2 IMPLANT
EVACUATOR SILICONE 100CC (DRAIN) ×1
GAUZE SPONGE 4X4 12PLY STRL (GAUZE/BANDAGES/DRESSINGS) ×2 IMPLANT
GLOVE BIO SURGEON STRL SZ 6.5 (GLOVE) ×2 IMPLANT
GLOVE BIOGEL PI IND STRL 6.5 (GLOVE) ×2 IMPLANT
GLOVE BIOGEL PI IND STRL 7.0 (GLOVE) ×4 IMPLANT
GOWN STRL REUS W/TWL LRG LVL3 (GOWN DISPOSABLE) ×6 IMPLANT
INST SET MINOR GENERAL (KITS) ×2 IMPLANT
KIT TURNOVER KIT A (KITS) ×2 IMPLANT
MANIFOLD NEPTUNE II (INSTRUMENTS) ×2 IMPLANT
NDL HYPO 18GX1.5 BLUNT FILL (NEEDLE) ×2 IMPLANT
NDL HYPO 21X1.5 SAFETY (NEEDLE) ×2 IMPLANT
NDL HYPO 25X1 1.5 SAFETY (NEEDLE) ×2 IMPLANT
NEEDLE HYPO 18GX1.5 BLUNT FILL (NEEDLE) ×1 IMPLANT
NEEDLE HYPO 21X1.5 SAFETY (NEEDLE) ×1 IMPLANT
NEEDLE HYPO 25X1 1.5 SAFETY (NEEDLE) ×1 IMPLANT
NS IRRIG 1000ML POUR BTL (IV SOLUTION) ×2 IMPLANT
PACK MINOR (CUSTOM PROCEDURE TRAY) ×2 IMPLANT
PAD ABD 5X9 TENDERSORB (GAUZE/BANDAGES/DRESSINGS) ×2 IMPLANT
PAD ABD 8X10 STRL (GAUZE/BANDAGES/DRESSINGS) IMPLANT
PAD ARMBOARD 7.5X6 YLW CONV (MISCELLANEOUS) ×2 IMPLANT
SET BASIN LINEN APH (SET/KITS/TRAYS/PACK) ×2 IMPLANT
SPONGE DRAIN TRACH 4X4 STRL 2S (GAUZE/BANDAGES/DRESSINGS) ×2 IMPLANT
SPONGE INTESTINAL PEANUT (DISPOSABLE) IMPLANT
SPONGE T-LAP 18X18 ~~LOC~~+RFID (SPONGE) ×4 IMPLANT
STAPLER VISISTAT (STAPLE) ×2 IMPLANT
SUT ETHILON 3 0 FSL (SUTURE) ×2 IMPLANT
SUT SILK 2 0 (SUTURE) ×1
SUT SILK 2 0 SH (SUTURE) ×2 IMPLANT
SUT SILK 2-0 18XBRD TIE 12 (SUTURE) ×2 IMPLANT
SUT VIC AB 2-0 CT1 27 (SUTURE) ×4
SUT VIC AB 2-0 CT1 TAPERPNT 27 (SUTURE) ×6 IMPLANT
SUT VIC AB 3-0 SH 27 (SUTURE)
SUT VIC AB 3-0 SH 27X BRD (SUTURE) IMPLANT
SUT VICRYL AB 2 0 TIES (SUTURE) IMPLANT
SYR 30ML LL (SYRINGE) ×2 IMPLANT
SYR CONTROL 10ML LL (SYRINGE) ×2 IMPLANT

## 2022-08-28 NOTE — Evaluation (Addendum)
Physical Therapy Evaluation Patient Details Name: Diana Jensen MRN: [id number] DOB: 1975/08/08 Today's Date: 08/28/2022  History of Present Illness  Diana Jensen is a 47 y/o female s/pTotal mastectomy and sentinel lymph node biopsy, with the diagnosis of DCIS, LEFT BREAST  CANCER, LEFT BREAST  ER POSITIVE.   Clinical Impression  Patient issued and instructed in post op mastectomy exercises with good return demonstrated and understanding acknowledged.  Patient able to ambulate in room/hallways with slightly labored cadence without loss of balance and encouraged to ambulate daily and do HEP as tolerated.  Plan:  Patient discharged from physical therapy to care of nursing for ambulation daily as tolerated for length of stay.      Recommendations for follow up therapy are one component of a multi-disciplinary discharge planning process, led by the attending physician.  Recommendations may be updated based on patient status, additional functional criteria and insurance authorization.  Follow Up Recommendations No PT follow up      Assistance Recommended at Discharge Set up Supervision/Assistance  Patient can return home with the following  A little help with bathing/dressing/bathroom;Help with stairs or ramp for entrance;Assistance with cooking/housework    Equipment Recommendations None recommended by PT  Recommendations for Other Services       Functional Status Assessment Patient has had a recent decline in their functional status and demonstrates the ability to make significant improvements in function in a reasonable and predictable amount of time.     Precautions / Restrictions Precautions Precautions: None Restrictions Weight Bearing Restrictions: No      Mobility  Bed Mobility Overal bed mobility: Modified Independent             General bed mobility comments: had to use bed rail with HOB flat    Transfers Overall transfer level: Modified independent                       Ambulation/Gait Ambulation/Gait assistance: Modified independent (Device/Increase time) Gait Distance (Feet): 200 Feet Assistive device: None   Gait velocity: decreased     General Gait Details: grossly WFL othe than slightly labored cadence without loss of balance with good return for ambulating in room/hallways  Stairs            Wheelchair Mobility    Modified Rankin (Stroke Patients Only)       Balance Overall balance assessment: Mild deficits observed, not formally tested                                           Pertinent Vitals/Pain Pain Assessment Pain Assessment: Faces Faces Pain Scale: Hurts a little bit Pain Location: left chest wall at surgical site Pain Descriptors / Indicators: Sore Pain Intervention(s): Limited activity within patient's tolerance, Monitored during session, Repositioned, Premedicated before session    Home Living Family/patient expects to be discharged to:: Private residence Living Arrangements: Spouse/significant other;Children Available Help at Discharge: Family;Available 24 hours/day Type of Home: House Home Access: Ramped entrance       Home Layout: One level Home Equipment: None;Grab bars - tub/shower      Prior Function Prior Level of Function : Independent/Modified Independent;Driving             Mobility Comments: Hydrographic surveyor, drives ADLs Comments: Independent     Hand Dominance        Extremity/Trunk Assessment  Upper Extremity Assessment Upper Extremity Assessment: Overall WFL for tasks assessed    Lower Extremity Assessment Lower Extremity Assessment: Overall WFL for tasks assessed    Cervical / Trunk Assessment Cervical / Trunk Assessment: Normal  Communication   Communication: No difficulties  Cognition Arousal/Alertness: Awake/alert Behavior During Therapy: WFL for tasks assessed/performed Overall Cognitive Status: Within Functional Limits for  tasks assessed                                          General Comments      Exercises     Assessment/Plan    PT Assessment Patient does not need any further PT services  PT Problem List         PT Treatment Interventions      PT Goals (Current goals can be found in the Care Plan section)  Acute Rehab PT Goals Patient Stated Goal: return home with family to assist PT Goal Formulation: With patient/family Time For Goal Achievement: 08/28/22 Potential to Achieve Goals: Good    Frequency       Co-evaluation               AM-PAC PT "6 Clicks" Mobility  Outcome Measure Help needed turning from your back to your side while in a flat bed without using bedrails?: None Help needed moving from lying on your back to sitting on the side of a flat bed without using bedrails?: None Help needed moving to and from a bed to a chair (including a wheelchair)?: None Help needed standing up from a chair using your arms (e.g., wheelchair or bedside chair)?: None Help needed to walk in hospital room?: None Help needed climbing 3-5 steps with a railing? : None 6 Click Score: 24    End of Session   Activity Tolerance: Patient tolerated treatment well;Patient limited by fatigue Patient left: in bed;with call bell/phone within reach;with family/visitor present Nurse Communication: Mobility status PT Visit Diagnosis: Unsteadiness on feet (R26.81);Other abnormalities of gait and mobility (R26.89);Muscle weakness (generalized) (M62.81)    Time: [id number] PT Time Calculation (min) (ACUTE ONLY): 26 min   Charges:   PT Evaluation $PT Eval Moderate Complexity: 1 Mod PT Treatments $Therapeutic Activity: 23-37 mins        3:35 PM, 08/28/22 Lonell Grandchild, MPT Physical Therapist with [facility] [phone] office [phone] mobile phone

## 2022-08-28 NOTE — Progress Notes (Signed)
   08/28/22 2145  Assess: MEWS Score  BP (!) 78/50  Level of Consciousness Alert  SpO2 (!) 72 % ([place] 5L applied)  O2 Device Nasal Cannula  Assess: MEWS Score  MEWS Temp 0  MEWS Systolic 2  MEWS Pulse 0  MEWS RR 0  MEWS LOC 0  MEWS Score 2  MEWS Score Color Yellow  Assess: if the MEWS score is Yellow or Red  Were vital signs taken at a resting state? Yes  Focused Assessment Change from prior assessment (see assessment flowsheet)  Does the patient meet 2 or more of the SIRS criteria? No  MEWS guidelines implemented  Yes, yellow  Treat  MEWS Interventions Considered administering scheduled or prn medications/treatments as ordered  Take Vital Signs  Increase Vital Sign Frequency  Yellow: Q2hr x1, continue Q4hrs until patient remains green for 12hrs  Escalate  MEWS: Escalate Yellow: Discuss with charge nurse and consider notifying provider and/or RRT  Notify: Charge Nurse/RN  Name of Charge Nurse/RN Zoila Shutter, RN  Provider Notification  Provider Name/Title [email or web address]  Date Provider Notified 08/28/22  Time Provider Notified 2145  Method of Notification Page  Notification Reason Change in status  Provider response See new orders  Date of Provider Response 08/28/22  Time of Provider Response 2145

## 2022-08-28 NOTE — Progress Notes (Signed)
[facility]  Updated her family about  surgery. Will admit, PRN for pain, lovenox tomorrow given her history. PT to work with her.   Curlene Labrum, MD [facility] [address] [place], [place] [address] [phone] (office)

## 2022-08-28 NOTE — Anesthesia Procedure Notes (Signed)
Procedure Name: Intubation Date/Time: 08/28/2022 11:41 AM  Performed by: Karna Dupes, CRNAPre-anesthesia Checklist: Patient identified, Emergency Drugs available, Suction available and Patient being monitored Patient Re-evaluated:Patient Re-evaluated prior to induction Oxygen Delivery Method: Circle system utilized Preoxygenation: Pre-oxygenation with 100% oxygen Induction Type: IV induction Ventilation: Mask ventilation without difficulty Laryngoscope Size: Mac and 3 Grade View: Grade II Tube type: Oral Tube size: 7.0 mm Number of attempts: 1 Airway Equipment and Method: Stylet Placement Confirmation: ETT inserted through vocal cords under direct vision, positive ETCO2 and breath sounds checked- equal and bilateral Secured at: 21 cm Tube secured with: Tape Dental Injury: Teeth and Oropharynx as per pre-operative assessment

## 2022-08-28 NOTE — H&P (Signed)
[facility] History and Physical  Reason for Referral: Breast cancer, left. Referring Physician: Johnette Abraham, MD   Chief Complaint   New Patient (Initial Visit)     Diana Jensen is a 47 y.o. female.  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 This all started in December when she felt an area around her left breast at the 9 oclock position. She had a mammogram and then was worked up with diagnostic and an US guided biopsy followed by a stereotactic biopsy.  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  This spans over 7cm per the last documented report and there is an area superficial to the cancer that  they are also concerned about but was not biopsied.  The patient has no history of any masses, lumps, bumps, nipple changes or discharge. She had menarche at age 78, and her first pregnancy at age 39. She has a history of any family breast cancer in her mom in her 65s that was was Stage III she reports and a paternal grandmother with colon cancer. She has never had any previous biopsies or concerning areas on mammogram prior to this episode.  She has not had any chest radiation.  She took lovenox during pregnancy for concern for pregnancy related antiphospholipid syndrome.    Past Medical History:  Diagnosis Date   Allergy    seasonal   Anxiety    Arthritis    deterioration of spine, knees   Carpal tunnel syndrome of right wrist    Clotting disorder (HCC)    antiphospholipid syndrome   Depression    Encounter for general adult medical examination with abnormal findings 07/03/2022   Essential hypertension, benign 02/10/2020   Hyperlipidemia 08/02/2022   Sleep disorder 02/22/2016    Past Surgical History:  Procedure Laterality Date   BREAST BIOPSY Left 08/10/2022   [place] LT BREAST BX W LOC DEV 1ST LESION IMG BX SPEC US GUIDE 08/10/2022 [facility]   BREAST BIOPSY Left  08/18/2022   MM LT BREAST BX W LOC DEV EA AD LESION IMG BX SPEC STEREO GUIDE 08/18/2022 [facility]   BREAST BIOPSY Left 08/18/2022   MM LT BREAST BX W LOC DEV 1ST LESION IMAGE BX SPEC STEREO GUIDE 08/18/2022 [facility]   CARPAL TUNNEL RELEASE Right 01/09/2019   Procedure: RIGHT CARPAL TUNNEL RELEASE;  Surgeon: Carole Civil, MD;  Location: [facility];  Service: Orthopedics;  Laterality: Right;   DILATION AND CURETTAGE OF UTERUS     x2   EYE SURGERY     lazy eye   TOTAL ABDOMINAL HYSTERECTOMY  2011    Family History  Problem Relation Age of Onset   Cancer Mother        breast    Arthritis Mother    Hypertension Mother    Osteoporosis Mother    Thyroid disease Father    Hypertension Father    Mental illness Sister        depression ? bipolar   Mental illness Daughter    Dementia Maternal Grandmother    Cancer Maternal Grandfather    Heart disease Paternal Grandmother    Cancer Paternal Grandfather    Colon cancer Paternal Grandfather    Cancer - Lung Neg Hx    Cervical cancer Neg Hx     Social History   Tobacco Use   Smoking status: Every Day  Packs/day: 0.50    Years: 20.00    Total pack years: 10.00    Types: Cigarettes   Smokeless tobacco: Never   Tobacco comments:    She has been smoking since age 18, smokes half a pack daily,was doing one pack a day until last year   Vaping Use   Vaping Use: Never used  Substance Use Topics   Alcohol use: No   Drug use: No    Medications: I have reviewed the patient's current medications. Allergies as of 08/24/2022   No Known Allergies      Medication List        Accurate as of August 24, 2022  2:21 PM. If you have any questions, ask your nurse or doctor.          escitalopram 10 MG tablet Commonly known as: Lexapro Take 1 tablet (10 mg total) by mouth daily.   hydrOXYzine 50 MG capsule Commonly known as: VISTARIL Take 1 capsule (50 mg total) by mouth daily as needed for anxiety.    ibuprofen 600 MG tablet Commonly known as: ADVIL Take 1 tablet (600 mg total) by mouth 2 (two) times daily as needed.   losartan 25 MG tablet Commonly known as: COZAAR Take 1 tablet (25 mg total) by mouth daily.   Vitamin D (Ergocalciferol) 1.25 MG (50000 UNIT) Caps capsule Commonly known as: DRISDOL Take 1 capsule (50,000 Units total) by mouth every 7 (seven) days for 12 doses.         ROS:  A comprehensive review of systems was negative except for: Integument/breast: positive for bruising at the breast on left Musculoskeletal: positive for neck pain  Blood pressure (!) 136/97, pulse 73, temperature 98.1 F (36.7 C), temperature source Oral, resp. rate 16, height '5\' 7"'$  (1.702 m), weight 265 lb (120.2 kg), SpO2 97 %. Physical Exam Vitals reviewed.  HENT:     Head: Normocephalic.     Nose: Nose normal.     Mouth/Throat:     Mouth: Mucous membranes are moist.  Eyes:     Extraocular Movements: Extraocular movements intact.  Cardiovascular:     Rate and Rhythm: Normal rate.  Pulmonary:     Effort: Pulmonary effort is normal.     Breath sounds: Normal breath sounds.  Chest:  Breasts:    Right: No inverted nipple, mass, nipple discharge or skin change.     Left: Inverted nipple, mass, skin change and tenderness present. No nipple discharge.     Comments: Left breast with bruising, some retraction of nipple at 9 oclock position, induration/ palpable area felt 9 clock, right breast normal  Abdominal:     General: There is no distension.     Palpations: Abdomen is soft.     Tenderness: There is no abdominal tenderness.  Musculoskeletal:        General: Normal range of motion.  Lymphadenopathy:     Upper Body:     Right upper body: No supraclavicular or axillary adenopathy.     Left upper body: No supraclavicular or axillary adenopathy.  Skin:    General: Skin is warm.  Neurological:     General: No focal deficit present.     Mental Status: She is alert and oriented to  person, place, and time.  Psychiatric:        Mood and Affect: Mood normal.        Behavior: Behavior normal.        Thought Content: Thought content normal.  Results:  CLINICAL DATA:  Mass felt by the patient in the anterior left breast for the past 2 months.   EXAM: DIGITAL DIAGNOSTIC UNILATERAL LEFT MAMMOGRAM WITH TOMOSYNTHESIS; ULTRASOUND LEFT BREAST LIMITED   TECHNIQUE: Left digital diagnostic mammography and breast tomosynthesis was performed.; Targeted ultrasound examination of the left breast was performed.   COMPARISON:  Previous exam(s).   ACR Breast Density Category b: There are scattered areas of fibroglandular density.   FINDINGS: Is an irregular, spiculated mass with calcifications in the anterior left breast at the location of the mass felt by the patient, marked with a metallic marker. The calcifications extend toward the skin and the entire area encompasses 3.7 x 2.3 cm in the craniocaudal projection and 1.5 cm in height in the oblique projection.   On physical exam, the patient has an approximately 1 cm rounded, faintly palpable mass in the 9 o'clock retroareolar left breast.   Targeted ultrasound is performed, showing a vertically oriented irregular hypoechoic mass with ill-defined surrounding increased echogenicity in the 9 o'clock retroareolar left breast. This measures 1.9 x 1.6 x 1.4 cm in maximum dimensions including fingers of tissue extending to the nipple in the area of calcifications extending to the nipple on the mammogram and extending away from the nipple.   Ultrasound of the left axilla demonstrated no enlarged left axillary lymph nodes.   IMPRESSION: 1.9 cm mass in the 9 o'clock retroareolar left breast with associated mammographic spiculations and calcifications spanning 3.7 x 2.3 x 1.5 cm. This has imaging features highly suspicious for malignancy.   RECOMMENDATION: Ultrasound-guided core needle biopsy of the 1.9 cm mass in the 9  o'clock retroareolar left breast. This has been discussed with the patient and her husband and scheduled at 8 a.m. on 08/10/2022.   I have discussed the findings and recommendations with the patient. If applicable, a reminder letter will be sent to the patient regarding the next appointment.   BI-RADS CATEGORY  5: Highly suggestive of malignancy.     Electronically Signed   By: Claudie Revering M.D.   On: 07/27/2022 15:57    ADDENDUM REPORT: 08/11/2022 11:56   ADDENDUM: PATHOLOGY revealed: A. LEFT BREAST, SUBAREOLAR MASS @ 9:00, BIOPSY: Invasive moderately differentiated ductal adenocarcinoma, grade 2. 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 Overall grade: Grade 2. Negative for angiolymphatic invasion. Microcalcifications present within DCIS. Negative for angiolymphatic invasion. Tumor measures 9 mm in greatest linear extent.   Pathology results are CONCORDANT with imaging findings, per Dr. Everlean Alstrom.   Pathology results and recommendations were discussed with patient via telephone on 08/11/2022. Patient reported biopsy site doing well with no adverse symptoms, and only slight tenderness at the site. Post biopsy care instructions were reviewed, questions were answered and my direct phone number was provided. Patient was instructed to call [facility] Mammography Department for any additional questions or concerns related to biopsy site.   RECOMMENDATIONS: 1. Please schedule patient for TWO additional stereotactic guided LEFT breast biopsies at the [facility], to include calcifications located superior and posterior to the biopsied mass. Request sent to [place] by Electa Sniff RN on 08/11/2022.   2. Surgical consultation to be scheduled for after the two additional biopsies are performed. Patient aware of additional biopsies and need for surgical  consultation. Request for oncological consultation sent to Kathi Der RT at [facility] Mammography Department on 08/11/2022 by Electa Sniff  RN.   3. Consider bilateral breast MRI to evaluate extent of breast disease given additional areas of concern and patient's relatively young age.   Pathology results reported by Electa Sniff RN on 08/11/2022.     Electronically Signed   By: Everlean Alstrom M.D.   On: 08/11/2022 11:56       CLINICAL DATA:  48 year old female presents for ultrasound-guided core biopsy of a 1.9 cm mass in the left breast at 9 o'clock retroareolar.   EXAM: ULTRASOUND GUIDED LEFT BREAST CORE NEEDLE BIOPSY   COMPARISON:  Previous exam(s).   PROCEDURE: I met with the patient and we discussed the procedure of ultrasound-guided biopsy, including benefits and alternatives. We discussed the high likelihood of a successful procedure. We discussed the risks of the procedure, including infection, bleeding, tissue injury, clip migration, and inadequate sampling. Informed written consent was given. The usual time-out protocol was performed immediately prior to the procedure.   Lesion quadrant: Lower inner   Using sterile technique and 1% Lidocaine as local anesthetic, under direct ultrasound visualization, a 14 gauge spring-loaded device was used to perform biopsy of the mass in the left breast at 9 o'clock retroareolar using a medial to lateral approach. At the conclusion of the procedure a ribbon shaped tissue marker clip was deployed into the biopsy cavity. Follow up 2 view mammogram was performed and dictated separately.   IMPRESSION: Ultrasound guided biopsy of the mass in the left breast at 9 o'clock retroareolar. No apparent complications.   Electronically Signed: By: Everlean Alstrom M.D. On: 08/10/2022 08:41  ADDENDUM REPORT: 08/22/2022 14:05   ADDENDUM: Pathology revealed DUCTAL CARCINOMA IN SITU, INTERMEDIATE NUCLEAR GRADE,  CRIBRIFORM AND PAPILLARY TYPES WITH  NECROSIS- NEGATIVE FOR INVASIVE CARCINOMA, MICROCALCIFICATIONS PRESENT WITHIN DCIS of the LEFT breast, central mid depth, (coil clip). This was found to be concordant by Dr. Ammie Ferrier.   Pathology revealed DUCTAL CARCINOMA IN SITU, INTERMEDIATE NUCLEAR GRADE, CRIBRIFORM AND PAPILLARY TYPES WITH   NECROSIS- NEGATIVE FOR INVASIVE CARCINOMA, MICROCALCIFICATIONS PRESENT WITHIN DCIS, FIBROCYSTIC CHANGES INCLUDING ADENOSIS of the LEFT breast, medial anterior, (X clip). This was found to be concordant by Dr. Ammie Ferrier.   Pathology results were discussed with the patient by telephone. The patient reported doing well after the biopsies with tenderness at the sites. Post biopsy instructions and care were reviewed and questions were answered. The patient was encouraged to call [facility] for any additional concerns.   The patient has a recent diagnosis of LEFT breast cancer and should follow her outlined treatment plan.   NOTE: If lumpectomy elected, we will need to also localize the additional calcifications in the anterior left breast (superior to the known breast cancer), that were not biopsied.   Pathology results reported by Stacie Acres RN on 08/22/2022.     Electronically Signed   By: Ammie Ferrier M.D.   On: 08/22/2022 14:05  CLINICAL DATA:  47 year old female presenting for stereotactic biopsy of 2 groups of calcifications in the left breast.   EXAM: LEFT BREAST STEREOTACTIC CORE NEEDLE BIOPSY   COMPARISON:  Previous exam(s).   FINDINGS: Prior to the procedure, spot compression magnification images over the anterior left breast were performed for evaluation of the calcifications recommended for biopsy. There are multiple groups of calcifications in a segmental orientations spanning approximately 7.2 x 3.2 cm. While preparing for the biopsy, the most anterior calcifications which are superior to the  biopsy-proven malignancy in the retroareolar left breast were found to be  too close to the skin surface for stereotactic sampling. Therefore 2 sites posterior to the biopsy marking clip were sampled instead.   The patient and I discussed the procedure of stereotactic-guided biopsy including benefits and alternatives. We discussed the high likelihood of a successful procedure. We discussed the risks of the procedure including infection, bleeding, tissue injury, clip migration, and inadequate sampling. Informed written consent was given. The usual time out protocol was performed immediately prior to the procedure.   Using ChloraPrep, sterile technique and 1% Lidocaine with epinephrine as the local anesthetic, under stereotactic guidance, a 9 gauge vacuum assisted device was used to perform core needle biopsy of calcifications in the central mid depth of the left breast using a medial approach. Specimen radiograph was performed showing calcifications in multiple core samples. Specimens with calcifications are identified for pathology.   Lesion quadrant: Upper inner quadrant   At the conclusion of the procedure, a coil shaped tissue marker clip was deployed into the biopsy cavity.   Using ChloraPrep, sterile technique and 1% Lidocaine with epinephrine as the local anesthetic, under stereotactic guidance, a 9 gauge vacuum assisted device was used to perform core needle biopsy of calcifications in the medial anterior left breast using a medial approach. Specimen radiograph was performed showing calcifications in multiple core samples. Specimens with calcifications are identified for pathology.   Lesion quadrant: Upper inner quadrant   At the conclusion of the procedure, an X shaped tissue marker clip was deployed into the biopsy cavity. Follow-up 2-view mammogram was performed and dictated separately.   IMPRESSION: 1. Stereotactic-guided biopsy of calcifications in the central mid depth of the left  breast (coil clip). No apparent complications.   2. Stereotactic calcifications in the medial anterior left breast (X clip). No apparent complications.   Electronically Signed: By: Ammie Ferrier M.D. On: 08/18/2022 10:04     Assessment & Plan:  AZAIRA SHEERIN is a 47 y.o. female with an invasive cancer and DCIS surrounding it with the areas spanning over 7 cm in size.   We discussed mastectomy versus MRI and possible partial but her breast size and the size of this area may preclude that attempt.    We have discussed that there is no difference in the prognosis or chance or recurrence or differences in survival between the two options. We have discussed the need for radiation with the lumpectomy, and we have discussed that she will be referred to oncology after our procedure to further discuss her options for chemotherapy and hormonal therapy if she qualifies.   We discussed dye injection with Magtrace and sentinel node biopsy.  We have discussed that the sentinel node biopsy tells [place] if the cancer has spread to the lymph nodes, and can help with plans for chemotherapy treatment and overall prognosis.    We have discussed that these are big discussions, and that the risk from the operations are similar including risk of bleeding, risk of infection, and risk of needing additional surgeries. We have discussed the likely need for an overnight stay with a mastectomy and a drain that will remain in place for about 1-2 week.      All questions were answered to the satisfaction of the patient and family.  Discussed her pregnancy use of lovneox due to antiphospholipid syndrome with Dr. Delton Coombes who recommended lovenox preoperatively.   She will get admitted for the mastectomy and get PT to work with her before discharge.    Diana Jensen 08/24/2022, 2:21 PM

## 2022-08-28 NOTE — Transfer of Care (Signed)
Immediate Anesthesia Transfer of Care Note  Patient: Diana Jensen  Procedure(s) Performed: SIMPLE MASTECTOMY WITH AXILLARY SENTINEL NODE BIOPSY (Left: Breast)  Patient Location: PACU  Anesthesia Type:General  Level of Consciousness: awake, alert , and oriented  Airway & Oxygen Therapy: Patient Spontanous Breathing and Patient connected to nasal cannula oxygen  Post-op Assessment: Report given to RN and Post -op Vital signs reviewed and stable  Post vital signs: Reviewed and stable  Last Vitals:  Vitals Value Taken Time  BP 114/66 08/28/22 1338  Temp 98.5   Pulse 81 08/28/22 1339  Resp 18 08/28/22 1339  SpO2 94 % 08/28/22 1339  Vitals shown include unvalidated device data.  Last Pain:  Vitals:   08/28/22 0923  TempSrc: Oral  PainSc: 0-No pain         Complications: No notable events documented.

## 2022-08-28 NOTE — Interval H&P Note (Signed)
History and Physical Interval Note:  08/28/2022 11:05 AM  Diana Jensen  has presented today for surgery, with the diagnosis of DCIS, LEFT BREAST CANCER, LEFT BREAST ER POSITIVE.  The various methods of treatment have been discussed with the patient and family. After consideration of risks, benefits and other options for treatment, the patient has consented to  Procedure(s): [place] (Left) as a surgical intervention.  The patient's history has been reviewed, patient examined, no change in status, stable for surgery.  I have reviewed the patient's chart and labs.  Questions were answered to the patient's satisfaction.    Breast marked, Sentinel node with magtrace to be injected in OR. Received lovenox preop for history of possible antiphospholipid syndrome.   Virl Cagey

## 2022-08-28 NOTE — Progress Notes (Signed)
Emptied patients JP drain at 1458 noted 20 mL output. Emptied JP drain again at 1705 noted output 60 mLs, large clot noted in bulb and unable to remove clot. Patient reported she had been doing PT and some exercises. MD Constance Haw made aware.

## 2022-08-28 NOTE — Progress Notes (Signed)
Pt reported feeling dizzy and stated "I felt like I was going to pass out when I got up to use the bathroom and I am seeing black dots." Manual BP 78/50, SP02 72, HR 61. Nasal cannula applied at 5L/ min. RT and MD notified. NS bolus started and pt reported feeling better. BP came up to 80/58 and SP02 96 on 5L/min.

## 2022-08-28 NOTE — Anesthesia Preprocedure Evaluation (Addendum)
Anesthesia Evaluation  Patient identified by MRN, date of birth, ID band Patient awake    Reviewed: Allergy & Precautions, H&P , NPO status , Patient's Chart, lab work & pertinent test results, reviewed documented beta blocker date and time   Airway Mallampati: II  TM Distance: >3 FB Neck ROM: full    Dental no notable dental hx.    Pulmonary Current Smoker   Pulmonary exam normal breath sounds clear to auscultation       Cardiovascular Exercise Tolerance: Good hypertension,  Rhythm:regular Rate:Normal     Neuro/Psych  PSYCHIATRIC DISORDERS Anxiety Depression     Neuromuscular disease negative neurological ROS  negative psych ROS   GI/Hepatic negative GI ROS, Neg liver ROS,,,  Endo/Other    Morbid obesity  Renal/GU negative Renal ROS  negative genitourinary   Musculoskeletal   Abdominal   Peds  Hematology negative hematology ROS (+)   Anesthesia Other Findings   Reproductive/Obstetrics negative OB ROS                             Anesthesia Physical Anesthesia Plan  ASA: 3  Anesthesia Plan: General and General ETT   Post-op Pain Management:    Induction:   PONV Risk Score and Plan: Propofol infusion and Ondansetron  Airway Management Planned:   Additional Equipment:   Intra-op Plan:   Post-operative Plan:   Informed Consent: I have reviewed the patients History and Physical, chart, labs and discussed the procedure including the risks, benefits and alternatives for the proposed anesthesia with the patient or authorized representative who has indicated his/her understanding and acceptance.     Dental Advisory Given  Plan Discussed with: CRNA  Anesthesia Plan Comments:        Anesthesia Quick Evaluation
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
## 2022-08-29 DIAGNOSIS — C50112 Malignant neoplasm of central portion of left female breast: Secondary | ICD-10-CM | POA: Diagnosis not present

## 2022-08-29 LAB — CBC WITH DIFFERENTIAL/PLATELET
Abs Immature Granulocytes: 0.14 10*3/uL — ABNORMAL HIGH (ref 0.00–0.07)
Basophils Absolute: 0 10*3/uL (ref 0.0–0.1)
Basophils Relative: 0 %
Eosinophils Absolute: 0 10*3/uL (ref 0.0–0.5)
Eosinophils Relative: 0 %
HCT: 35.2 % — ABNORMAL LOW (ref 36.0–46.0)
Hemoglobin: 11.2 g/dL — ABNORMAL LOW (ref 12.0–15.0)
Immature Granulocytes: 1 %
Lymphocytes Relative: 11 %
Lymphs Abs: 1.7 10*3/uL (ref 0.7–4.0)
MCH: 28.1 pg (ref 26.0–34.0)
MCHC: 31.8 g/dL (ref 30.0–36.0)
MCV: 88.4 fL (ref 80.0–100.0)
Monocytes Absolute: 1 10*3/uL (ref 0.1–1.0)
Monocytes Relative: 7 %
Neutro Abs: 11.9 10*3/uL — ABNORMAL HIGH (ref 1.7–7.7)
Neutrophils Relative %: 81 %
Platelets: 304 10*3/uL (ref 150–400)
RBC: 3.98 MIL/uL (ref 3.87–5.11)
RDW: 13.2 % (ref 11.5–15.5)
WBC: 14.8 10*3/uL — ABNORMAL HIGH (ref 4.0–10.5)
nRBC: 0 % (ref 0.0–0.2)

## 2022-08-29 LAB — BASIC METABOLIC PANEL
Anion gap: 9 (ref 5–15)
BUN: 15 mg/dL (ref 6–20)
CO2: 22 mmol/L (ref 22–32)
Calcium: 8.3 mg/dL — ABNORMAL LOW (ref 8.9–10.3)
Chloride: 105 mmol/L (ref 98–111)
Creatinine, Ser: 0.75 mg/dL (ref 0.44–1.00)
GFR, Estimated: >60 mL/min (ref >60)
Glucose, Bld: 152 mg/dL — ABNORMAL HIGH (ref 70–99)
Potassium: 4.5 mmol/L (ref 3.5–5.1)
Sodium: 136 mmol/L (ref 135–145)

## 2022-08-29 LAB — HIV ANTIBODY (ROUTINE TESTING W REFLEX): HIV Screen 4th Generation wRfx: NONREACTIVE

## 2022-08-29 NOTE — Progress Notes (Signed)
190 mL of bright red drainage noted through out the night from drain. BP low this a.m. Pt slept through the night.

## 2022-08-29 NOTE — Progress Notes (Signed)
[facility] Progress Note  1 Day Post-Op  Subjective: BP lower overnight, received her losartan at 4pm and this is different from her normal time of taking it. This plus anesthesia, and some blood loss likely causing the BP drifting.  Received bolus last night. Desaturation requiring O2 after getting up to the bathroom and having pain.  Says more pain with movement and being up than lay down.  Objective: Vital signs in last 24 hours: Temp:  [97.6 F (36.4 C)-98.6 F (37 C)] 98.6 F (37 C) (03/05 0733) Pulse Rate:  [64-98] 86 (03/05 0733) Resp:  [13-20] 20 (03/05 0733) BP: (78-125)/(50-92) 94/61 (03/05 0733) SpO2:  [72 %-96 %] 94 % (03/05 0733) Weight:  [122.1 kg] 122.1 kg (03/04 1459)    Intake/Output from previous day: 03/04 0701 - 03/05 0700 In: 1300 [I.V.:1200; IV Piggyback:100] Out: 416 [Urine:1; Drains:390; Blood:25] Intake/Output this shift: Total I/O In: 240 [P.O.:240] Out: -   General appearance: alert and no distress Resp: normal work of breathing Breasts: right breast normal, some bruising on the sternum, flaps on left mastectomy site, swollen and bruised, has bled some under the flaps, JP drain with bloody fluid, stripped   Lab Results:  Recent Labs    08/29/22 0439  WBC 14.8*  HGB 11.2*  HCT 35.2*  PLT 304   BMET Recent Labs    08/29/22 0439  NA 136  K 4.5  CL 105  CO2 22  GLUCOSE 152*  BUN 15  CREATININE 0.75  CALCIUM 8.3*   PT/INR No results for input(s): "LABPROT", "INR" in the last 72 hours.  Studies/Results: DG CHEST PORT 1 VIEW  Result Date: 08/28/2022 CLINICAL DATA:  Hypoxia EXAM: PORTABLE CHEST 1 VIEW COMPARISON:  10/25/19 FINDINGS: Cardiac shadow is within normal limits. Lungs are well aerated bilaterally. Postsurgical changes are noted over the left chest wall. No acute bony abnormality is seen. IMPRESSION: No acute abnormality noted. Electronically Signed   By: Inez Catalina M.D.   On: 08/28/2022 22:29     Anti-infectives: Anti-infectives (From admission, onward)    Start     Dose/Rate Route Frequency Ordered Stop   08/28/22 0900  ceFAZolin (ANCEF) IVPB 3g/100 mL premix        3 g 200 mL/hr over 30 Minutes Intravenous On call to O.R. 08/28/22 0855 08/28/22 1213   08/28/22 0856  ceFAZolin (ANCEF) 3-0.9 GM/100ML-% IVPB       Note to Pharmacy: Paul Dykes R: cabinet override      08/28/22 0856 08/28/22 1149       Assessment/Plan: Patient s/p left mastectomy and sentinel node for invasive cancer and large area DCIS. Doing well but did have some lower BP and desaturations. Has a hemtoma/ bruising under the flaps with swelling, will hold her lovenox today and ambulate.    Diet PRN for pain, use orals to help control pain longer Will hold her losartan today Drink fluids to try to get BP up Monitor O2 and if any question of more desaturation will have low threshold for ordering CTA given history of possible antiphospholipid syndrome, received prophylactic lovenox preop and was up and working with PT post op so unlikely Continue PT exercises Hopefull home in next 48 hours Labs tomorrow    LOS: 0 days    Virl Cagey 08/29/2022

## 2022-08-29 NOTE — Progress Notes (Signed)
Patient had ambulated in hallway and to the bathroom during shift. Patient reports discomfort when ambulating, patient has not requested any PRN pain medication. Patient continues taking scheduled Tylenol with no complaints. This writer has emptied out 55 mL of bright red blood from JP drain. JP drain tubing has been striped every 4 hour and emptied per order.

## 2022-08-30 ENCOUNTER — Other Ambulatory Visit: Payer: Self-pay

## 2022-08-30 DIAGNOSIS — C50112 Malignant neoplasm of central portion of left female breast: Secondary | ICD-10-CM | POA: Diagnosis not present

## 2022-08-30 LAB — CBC WITH DIFFERENTIAL/PLATELET
Abs Immature Granulocytes: 0.15 10*3/uL — ABNORMAL HIGH (ref 0.00–0.07)
Basophils Absolute: 0.1 10*3/uL (ref 0.0–0.1)
Basophils Relative: 1 %
Eosinophils Absolute: 0.2 10*3/uL (ref 0.0–0.5)
Eosinophils Relative: 2 %
HCT: 33.3 % — ABNORMAL LOW (ref 36.0–46.0)
Hemoglobin: 10.9 g/dL — ABNORMAL LOW (ref 12.0–15.0)
Immature Granulocytes: 1 %
Lymphocytes Relative: 43 %
Lymphs Abs: 4.7 10*3/uL — ABNORMAL HIGH (ref 0.7–4.0)
MCH: 29 pg (ref 26.0–34.0)
MCHC: 32.7 g/dL (ref 30.0–36.0)
MCV: 88.6 fL (ref 80.0–100.0)
Monocytes Absolute: 0.7 10*3/uL (ref 0.1–1.0)
Monocytes Relative: 6 %
Neutro Abs: 5.3 10*3/uL (ref 1.7–7.7)
Neutrophils Relative %: 47 %
Platelets: 305 10*3/uL (ref 150–400)
RBC: 3.76 MIL/uL — ABNORMAL LOW (ref 3.87–5.11)
RDW: 13.7 % (ref 11.5–15.5)
WBC: 11.1 10*3/uL — ABNORMAL HIGH (ref 4.0–10.5)
nRBC: 0 % (ref 0.0–0.2)

## 2022-08-30 LAB — BASIC METABOLIC PANEL
Anion gap: 9 (ref 5–15)
BUN: 14 mg/dL (ref 6–20)
CO2: 24 mmol/L (ref 22–32)
Calcium: 8.4 mg/dL — ABNORMAL LOW (ref 8.9–10.3)
Chloride: 107 mmol/L (ref 98–111)
Creatinine, Ser: 0.74 mg/dL (ref 0.44–1.00)
GFR, Estimated: >60 mL/min (ref >60)
Glucose, Bld: 100 mg/dL — ABNORMAL HIGH (ref 70–99)
Potassium: 3.9 mmol/L (ref 3.5–5.1)
Sodium: 140 mmol/L (ref 135–145)

## 2022-08-30 MED ORDER — OXYCODONE HCL 5 MG PO TABS: 5.0000 mg | ORAL_TABLET | ORAL | 0 refills | Status: DC | PRN

## 2022-08-30 MED ORDER — ONDANSETRON 4 MG PO TBDP: 4.0000 mg | ORAL_TABLET | Freq: Four times a day (QID) | ORAL | 0 refills | Status: DC | PRN

## 2022-08-30 NOTE — Discharge Summary (Signed)
Physician Discharge Summary  Patient ID: SHAYLAN MISQUEZ MRN: [id number] DOB/AGE: May 04, 1976 47 y.o.  Admit date: 08/28/2022 Discharge date: 08/30/2022  Admission Diagnoses:  Discharge Diagnoses:  Principal Problem:   Malignant neoplasm of central portion of left breast in female, estrogen receptor positive ([place]) Active Problems:   Breast cancer ([place])   Ductal carcinoma in situ (DCIS) of left breast   Discharged Condition: {condition:18240}  Hospital Course: ***  Consults: {consultation:18241}  Significant Diagnostic Studies: {diagnostics:18242}  Treatments: {Tx:18249}  Discharge Exam: Blood pressure (!) 101/57, pulse 73, temperature 98.2 F (36.8 C), resp. rate 18, height '5\' 7"'$  (1.702 m), weight 122.1 kg, SpO2 97 %. {physical SA:931536  Disposition: Discharge disposition: 01-Home or Self Care       Discharge Instructions     Call MD for:  difficulty breathing, headache or visual disturbances   Complete by: As directed    Call MD for:  extreme fatigue   Complete by: As directed    Call MD for:  persistant dizziness or light-headedness   Complete by: As directed    Call MD for:  persistant nausea and vomiting   Complete by: As directed    Call MD for:  redness, tenderness, or signs of infection (pain, swelling, redness, odor or green/yellow discharge around incision site)   Complete by: As directed    Call MD for:  severe uncontrolled pain   Complete by: As directed    Call MD for:  temperature >100.4   Complete by: As directed    Increase activity slowly   Complete by: As directed       Allergies as of 08/30/2022   No Known Allergies      Medication List     TAKE these medications    acetaminophen 500 MG tablet Commonly known as: TYLENOL Take 1,000 mg by mouth every 6 (six) hours as needed for headache.   escitalopram 10 MG tablet Commonly known as: Lexapro Take 1 tablet (10 mg total) by mouth daily.   hydrOXYzine 50 MG capsule Commonly known  as: VISTARIL Take 1 capsule (50 mg total) by mouth daily as needed for anxiety.   ibuprofen 600 MG tablet Commonly known as: ADVIL Take 1 tablet (600 mg total) by mouth 2 (two) times daily as needed.   losartan 25 MG tablet Commonly known as: COZAAR Take 1 tablet (25 mg total) by mouth daily.   ondansetron 4 MG disintegrating tablet Commonly known as: ZOFRAN-ODT Take 1 tablet (4 mg total) by mouth every 6 (six) hours as needed for nausea.   oxyCODONE 5 MG immediate release tablet Commonly known as: Oxy IR/ROXICODONE Take 1-2 tablets (5-10 mg total) by mouth every 4 (four) hours as needed for severe pain or breakthrough pain.   Vitamin D (Ergocalciferol) 1.25 MG (50000 UNIT) Caps capsule Commonly known as: DRISDOL Take 1 capsule (50,000 Units total) by mouth every 7 (seven) days for 12 doses.         Signed: Virl Cagey 08/30/2022, 11:23 AM

## 2022-08-30 NOTE — Discharge Instructions (Signed)
Discharge instructions after breast surgery:   Common Complaints: Pain and bruising at the incision sites.  Swelling at the incision sites. Stiffness of the arm.   The bruising will evolve. Call if any concerns or issues. The JP drain output should change to thinner pinker and then to yellow straw colored fluid.   [place] Please keep the drain clean and dry. Replace the gauze/ tape around the drain if it gets dirty or wet/ saturated. Please do not mess with or cut the stitch that is keeping the drain in place. Secure the drain to your clothes so that it does not get dislodged.  Please record the output from the drain daily including the color and the amount in milliliters.  Please keep the drain covered with plastic and tape when you shower so that it does not get wet.  Otherwise you can take a bird bath and not get the drain wet. If the incision gets wet, just pat it dry.    Diet/ Activity: Diet as tolerated.  You may shower but do not take hot showers as this can disrupt the glue. Rest and listen to your body, but do not remain in bed all day.  Walk everyday for at least 15-20 minutes. Deep cough and move around every 1-2 hours in the first few days after surgery.  Do not lift > 10 lbs for the first 2 weeks after surgery. Do not do anything that makes you feel like you are putting unnecessary pull or stretch on the incision sites.  Do move your arm and shoulder (see exercises options below). If you do not move then you can get stiff and hurt more.  Do not pick at the dermabond glue on your incision sites.  This glue film will remain in place for 1-2 weeks and will start to peel off.  Do not place lotions or balms on your incision unless instructed to specifically by Dr. Constance Haw.   Pain Expectations and Narcotics: -After surgery you will have pain associated with your incisions and this is normal. The pain is muscular and nerve pain, and will get better with time. -You are  encouraged and expected to take non narcotic medications like tylenol and ibuprofen (when able) to treat pain as multiple modalities can aid with pain treatment. -Narcotics are only used when pain is severe or there is breakthrough pain. -You are not expected to have a pain score of 0 after surgery, as we cannot prevent pain. A pain score of 3-4 that allows you to be functional, move, walk, and tolerate some activity is the goal. The pain will continue to improve over the days after surgery and is dependent on your surgery. -Due to [place] law, we are only able to give a certain amount of pain medication to treat post operative pain, and we only give additional narcotics on a patient by patient basis.  -For most laparoscopic surgery, studies have shown that the majority of patients only need 10-15 narcotic pills, and for open surgeries most patients only need 15-20.   -Having appropriate expectations of pain and knowledge of pain management with non narcotics is important as we do not want anyone to become addicted to narcotic pain medication.  -Using ice packs in the first 48 hours and heating pads after 48 hours, wearing an abdominal binder (when recommended), and using over the counter medications are all ways to help with pain management.   -Simple acts like meditation and mindfulness practices after surgery can  also help with pain control and research has proven the benefit of these practices.  Medication: Take tylenol scheduled for now. Tylenol '1000mg'$  @ 6am, 12noon, 6pm, 34mdnight (Do not exceed '4000mg'$  of tylenol a day).  You can start taking some ibuprofen after 09/02/2022 if desired.  Ibuprofen '800mg'$  @ 9am, 3pm, 9pm, 3am (Do not exceed '3600mg'$  of ibuprofen a day).  Take Roxicodone for breakthrough pain every 4 hours.  Take Colace for constipation related to narcotic pain medication. If you do not have a bowel movement in 2 days, take Miralax over the counter.  Drink plenty of water to also prevent  constipation.   Contact Information: If you have questions or concerns, please call our office, [phone] Monday- Thursday 8AM-5PM and Friday 8AM-12Noon.  If it is after hours or on the weekend, please call [facility], [phone] and ask to speak to the surgeon on call for Dr[facility]   Exercises After Breast Surgery Follow Physical Therapies recommendations Daily- These are some other options for exercises that are similar.   Do at least a few of the exercises below twice a day. It is ok to start the day after surgery and gradually build up the amount and type of exercises you do. Link to the exercises with pictures ([email or web address].   Deep Breathing Exercise Deep breathing can help you relax and ease discomfort and tightness around your incision (surgical cut). It's also a very good way to relieve stress during the day.  Sit comfortably in a chair. Take a slow, deep breath through your nose. Let your chest and belly expand. Breathe out slowly through your mouth. Repeat as many times as needed.  Arm and Shoulder Exercises Doing arm and shoulder exercises will help you get back your full range of motion on your affected side (the side where you had your surgery). With full range of motion, you'll be able to: Move your arm over your head and out to the side Move your arm behind your neck Move your arm to the middle of your back Do each of the exercises below 5 times a day. Keep doing this until you have a full range of motion again and can use your arm as you did before surgery in all your normal activities. This includes activities at work, at home, and in recreation or sports. If you had limited movement in your arm before surgery, your goal will be to get back as much movement as you had before.  If you get your full range of motion back quickly, keep doing these exercises once  a day instead of 5 times a day. This is especially true if you feel any tightness in your chest, shoulder, or under your affected arm. These exercises can help keep scar tissue from forming in your armpit and shoulder. Scar tissue can limit your arm movements later.  If you still have trouble moving your shoulder 4 weeks after your surgery, tell your surgeon. They'll tell you if you need more rehabilitation, such as physical or occupational therapy.  If you had one of the following surgeries, you can do the following set of exercises on the first day after your surgery, as long as your surgeon tells you it's safe.  Shoulder rolls The shoulder roll is a good exercise to start with because it gently stretches your chest and shoulder muscles.  Stand or sit comfortably with your arms relaxed at your sides. Start with backward shoulder rolls. In a circular motion,  bring your shoulders forward, up, backward, and down. Do this 10 times. Switch directions and do 10 forward shoulder rolls. Bring your shoulders backward, up, forward, and down. Do this 10 times. Try to make the circles as big as you can and move both shoulders at the same time. If you have some tightness across your incision or chest, start with smaller circles and make them bigger as the tightness decreases. The backward direction might feel a little tighter across your chest than the forward direction. This will get better with practice.  Shoulder wings The shoulder wings exercise will help you get back outward movement of your shoulder. You can do this exercise while sitting or standing.  Place your hands on your chest or collarbone. Raise your elbows out to the side, limiting your range of motion as instructed by your healthcare team. Slowly lower your elbows. Do this 10 times. Then, slowly lower your hands. If you feel discomfort while doing this exercise, hold your position and do the deep breathing exercise. If the discomfort  passes, raise your elbows a little higher. If it doesn't pass, don't raise your elbows any higher. Finish the exercise raising your elbows only high enough to feel a gentle stretch and no discomfort.  Arm circles If you had surgery on both breasts, do this exercise with both arms, 1 arm at a time. Don't do this exercise with both arms at the same time. This will put too much pressure on your chest.  Stand with your feet slightly apart for balance. Raise your affected arm out to the side as high as you can, limiting your range of movement as instructed by your healthcare team. Start making slow, backward circles in the air with your arm. Make sure you're moving your arm from your shoulder, not your elbow. Keep your elbow straight. Increase the size of the circles until they're as big as you can comfortably make them, limiting your range of motion as instructed by your healthcare team. If you feel any aching or if your arm is tired, take a break. Keep doing the exercise when you feel better. Do 10 full backward circles. Then, slowly lower your arm to your side. Rest your arm for a moment. Follow steps 1 to 4 again, but this time make slow, forward circles.  W exercise You can do the W exercise while sitting or standing.  Form a "W" with your arms out to the side and palms facing forward (see Figure 4). Try to bring your hands up so they're even with your face. If you can't raise your arms that high, bring them to the highest comfortable position. Make sure to limit your range of motion as instructed by your healthcare team. Pinch your shoulder blades together and downward, as if you're squeezing a pencil between them. If you feel discomfort, stop at that position and do the deep breathing exercise. If the discomfort passes, try to bring your arms back a little further. If it doesn't pass, don't reach any further. Hold the furthest position that doesn't cause discomfort. Squeeze your shoulder blades  together and downward for 5 seconds. Slowly bring your arms back down to the starting position. Repeat this movement 10 times.  Back Climb You can do the back climb stretch while sitting or standing. You'll need a timer or stopwatch.  Place your hands behind your back. Hold the hand on your affected side with your other hand. If you had surgery on both breasts, use the arm  that moves most easily to hold the other. Slowly slide your hands up the center of your back as far as you can. If you feel tightness near your incision, stop at that position and do the deep breathing exercise. If the tightness decreases, try to slide your hands up a little further. If it doesn't decrease, don't slide your hands up any further. Hold the highest position you can for 1 minute. Use your stopwatch or timer to keep track. You should feel a gentle stretch in your shoulder area. After 1 minute, slowly lower your hands.  Hands behind neck You can do the hands behind neck stretch while sitting or standing. You'll need a timer or stopwatch.  Clasp your hands together on your lap or in front of you. Slowly raise your hands toward your head, keeping your elbows together in front of you, not out to the sides. Keep your head level. Don't bend your neck or head forward. Slide your hands over your head until you reach the back of your neck. When you get to this point, spread your elbows out to the sides. Hold this position for 1 minute. Use your stopwatch or timer to keep track. Breathe normally. Don't hold your breath as you stretch your body. If you have some tightness across your incision or chest, hold your position and do the deep breathing exercise. If the tightness decreases, continue with the movement. If the tightness stays the same, reach up and stretch your elbows back as best as you can without causing discomfort. Hold the position you're most comfortable in for 1 minute. Slowly come out of the stretch by bringing  your elbows together and sliding your hands over your head. Then, slowly lower your arms.  Forward wall crawls You'll need 2 pieces of tape for the forward wall crawl exercise.  Stand facing a wall. Your toes should be about 6 inches (15 centimeters) from the wall. Reach as high as you can with your unaffected arm. [place] that point with a piece of tape. This will be the goal for your affected arm. If you had surgery on both breasts, set your goal using the arm that moves most comfortably. Place both hands against the wall at a level that's comfortable. Crawl your fingers up the wall as far as you can, keeping them even with each other.. Try not to look up toward your hands or arch your back. When you get to the point where you feel a good stretch, but not pain, do the deep breathing exercise. Return to the starting position by crawling your fingers back down the wall. Repeat the wall crawl 10 times. Each time you raise your hands, try to crawl a little bit higher. On the 10th crawl, use the other piece of tape to mark the highest point you reached with your affected arm. This will let you to see your progress each time you do this exercise. As you become more flexible, you may need to take a step closer to the wall so you can reach a little higher.   Side wall crawls You'll also need 2 pieces of tape for the side wall crawl exercise.  You shouldn't feel pain while doing this exercise. It's normal to feel some tightness or pulling across the side of your chest. Focus on your breathing until the tightness decreases. Breathe normally throughout this exercise. Don't hold your breath.  Be careful not to turn your body toward the wall while doing this exercise. Make sure  only the side of your body faces the wall.  If you had surgery on both breasts, start with step 3.  Stand with your unaffected side closest to the wall, about 1 foot (30.5 centimeters) away from the wall. Reach as high as you can  with your unaffected arm. [place] that point with a piece of tape (see Figure 8). This will be the goal for your affected arm. Turn your body so your affected side is now closest to the wall. If you had surgery on both breasts, start with either side closest to the wall. Crawl your fingers up the wall as far as you can. When you get to the point where you feel a good stretch, but not pain, do the deep breathing exercise. Return to the starting position by crawling your fingers back down the wall. Repeat this exercise 10 times. On your 10th crawl, use a piece of tape to mark the highest point you reached with your affected arm. This will let you see your progress each time you do the exercise. If you had surgery on both breasts, repeat the exercise with your other arm.  Swelling After your surgery, you may have some swelling or puffiness in your hand or arm on your affected side. This is normal and usually goes away on its own.  If you notice swelling in your hand or arm, follow the tips below to help the swelling go away.  Raise your arm above your head and do hand pumps several times a day. To do hand pumps, slowly open and close your fist 10 times. This will help drain the fluid out of your arm. Don't hold your arm straight up over your head for more than a few minutes. This can cause your arm muscles to get tired. Raise your arm to the side a few times a day for about 20 minutes at a time. To do this, sit or lie down on your back. Rest your arm on a few pillows next to you so it's raised above the level of your heart. If you're able to sleep on your unaffected side, you can place 1 or 2 pillows in front of you and rest your affected arm on them while you sleep. If the swelling doesn't go down within 4 to 6 weeks, call your surgeon or nurse.

## 2022-08-30 NOTE — Anesthesia Postprocedure Evaluation (Signed)
Anesthesia Post Note  Patient: Diana Jensen  Procedure(s) Performed: SIMPLE MASTECTOMY WITH AXILLARY SENTINEL NODE BIOPSY (Left: Breast)  Patient location during evaluation: Phase II Anesthesia Type: General Level of consciousness: awake Pain management: pain level controlled Vital Signs Assessment: post-procedure vital signs reviewed and stable Respiratory status: spontaneous breathing and respiratory function stable Cardiovascular status: blood pressure returned to baseline and stable Postop Assessment: no headache and no apparent nausea or vomiting Anesthetic complications: no Comments: Late entry   No notable events documented.   Last Vitals:  Vitals:   08/30/22 0006 08/30/22 0407  BP: 103/63 109/64  Pulse: 71 67  Resp: 16 18  Temp: 36.8 C 36.7 C  SpO2: 97% 97%    Last Pain:  Vitals:   08/30/22 0407  TempSrc: Oral  PainSc:                  Louann Sjogren

## 2022-08-30 NOTE — Progress Notes (Signed)
  Transition of Care [facility]) Screening Note   Patient Details  Name: Diana Jensen Date of Birth: 1975-07-31   Transition of [facility]) CM/SW Contact:    Iona Beard, [place] Phone Number: 08/30/2022, 8:35 AM    Transition of Care Department [facility]) has reviewed patient and no TOC needs have been identified at this time. We will continue to monitor patient advancement through interdisciplinary progression rounds. If new patient transition needs arise, please place a TOC consult.

## 2022-08-31 ENCOUNTER — Telehealth: Payer: Self-pay | Admitting: *Deleted

## 2022-08-31 ENCOUNTER — Telehealth: Payer: Self-pay

## 2022-08-31 ENCOUNTER — Other Ambulatory Visit: Payer: Self-pay

## 2022-08-31 ENCOUNTER — Emergency Department (HOSPITAL_COMMUNITY)
Admission: EM | Admit: 2022-08-31 | Discharge: 2022-08-31 | Disposition: A | Discharge status: Home / Self Care | Payer: Managed Care, Other (non HMO) | Attending: Emergency Medicine | Admitting: Emergency Medicine

## 2022-08-31 DIAGNOSIS — Z5189 Encounter for other specified aftercare: Secondary | ICD-10-CM | POA: Insufficient documentation

## 2022-08-31 DIAGNOSIS — Z9010 Acquired absence of unspecified breast and nipple: Secondary | ICD-10-CM | POA: Insufficient documentation

## 2022-08-31 NOTE — ED Provider Notes (Addendum)
[facility] Provider Note   CSN: [id number] Arrival date & time: 08/31/22  1434     History  Chief Complaint  Patient presents with   Post-op Problem    Diana Jensen is a 47 y.o. female.  Patient had a mastectomy done on Monday.  She noticed some purple discoloration down through her chest and abdomen and came to the emergency department for evaluation  The history is provided by the patient and medical records.  Wound Check This is a new problem. The current episode started 12 to 24 hours ago. The problem occurs constantly. The problem has not changed since onset.Pertinent negatives include no chest pain, no abdominal pain and no headaches. Nothing aggravates the symptoms. Nothing relieves the symptoms.       Home Medications Prior to Admission medications   Medication Sig Start Date End Date Taking? Authorizing Provider  acetaminophen (TYLENOL) 500 MG tablet Take 1,000 mg by mouth every 6 (six) hours as needed for headache.    [provider]  escitalopram (LEXAPRO) 10 MG tablet Take 1 tablet (10 mg total) by mouth daily. 07/03/22   Johnette Abraham, MD  hydrOXYzine (VISTARIL) 50 MG capsule Take 1 capsule (50 mg total) by mouth daily as needed for anxiety. 07/03/22   Johnette Abraham, MD  ibuprofen (ADVIL) 600 MG tablet Take 1 tablet (600 mg total) by mouth 2 (two) times daily as needed. Patient not taking: Reported on 08/24/2022 09/15/21   Renee Rival, FNP  losartan (COZAAR) 25 MG tablet Take 1 tablet (25 mg total) by mouth daily. 07/03/22 12/30/22  Johnette Abraham, MD  ondansetron (ZOFRAN-ODT) 4 MG disintegrating tablet Take 1 tablet (4 mg total) by mouth every 6 (six) hours as needed for nausea. 08/30/22   Virl Cagey, MD  oxyCODONE (OXY IR/ROXICODONE) 5 MG immediate release tablet Take 1-2 tablets (5-10 mg total) by mouth every 4 (four) hours as needed for severe pain or breakthrough pain. 08/30/22   Virl Cagey,  MD  Vitamin D, Ergocalciferol, (DRISDOL) 1.25 MG (50000 UNIT) CAPS capsule Take 1 capsule (50,000 Units total) by mouth every 7 (seven) days for 12 doses. 07/04/22 09/20/22  Johnette Abraham, MD      Allergies    Patient has no known allergies.    Review of Systems   Review of Systems  Constitutional:  Negative for appetite change and fatigue.  HENT:  Negative for congestion, ear discharge and sinus pressure.   Eyes:  Negative for discharge.  Respiratory:  Negative for cough.   Cardiovascular:  Negative for chest pain.  Gastrointestinal:  Negative for abdominal pain and diarrhea.  Genitourinary:  Negative for frequency and hematuria.  Musculoskeletal:  Negative for back pain.  Skin:  Negative for rash.       Propofol skin discoloration to abdomen from chest surgery  Neurological:  Negative for seizures and headaches.  Psychiatric/Behavioral:  Negative for hallucinations.     Physical Exam Updated Vital Signs BP 123/75   Pulse 73   Temp 98 F (36.7 C) (Oral)   Resp 17   Ht '5\' 7"'$  (1.702 m)   Wt 122.1 kg   SpO2 96%   BMI 42.16 kg/m  Physical Exam Vitals and nursing note reviewed.  Constitutional:      Appearance: She is well-developed.  HENT:     Head: Normocephalic.     Nose: Nose normal.  Eyes:     General: No scleral  icterus.    Conjunctiva/sclera: Conjunctivae normal.  Neck:     Thyroid: No thyromegaly.  Cardiovascular:     Rate and Rhythm: Normal rate and regular rhythm.     Heart sounds: No murmur heard.    No friction rub. No gallop.  Pulmonary:     Breath sounds: No stridor. No wheezing or rales.  Chest:     Chest wall: No tenderness.  Abdominal:     General: There is no distension.     Tenderness: There is no abdominal tenderness. There is no rebound.  Musculoskeletal:        General: Normal range of motion.     Cervical back: Neck supple.  Lymphadenopathy:     Cervical: No cervical adenopathy.  Skin:    Findings: No erythema or rash.     Comments:  Bruising to the chest and abdomen from surgery  Neurological:     Mental Status: She is oriented to person, place, and time.     Motor: No abnormal muscle tone.     Coordination: Coordination normal.  Psychiatric:        Behavior: Behavior normal.     ED Results / Procedures / Treatments   Labs (all labs ordered are listed, but only abnormal results are displayed) Labs Reviewed - No data to display  EKG None  Radiology No results found.  Procedures Procedures    Medications Ordered in ED Medications - No data to display  ED Course/ Medical Decision Making/ A&P                             Medical Decision Making  Patient status mastectomy,patient with normal bruising after surgery.  She was seen by Dr. Constance Haw and will follow-up with Dr. Constance Haw next week        Final Clinical Impression(s) / ED Diagnoses Final diagnoses:  Visit for wound check    Rx / DC Orders ED Discharge Orders     None         Milton Ferguson, MD 09/01/22 1216    Milton Ferguson, MD 09/01/22 1216

## 2022-08-31 NOTE — ED Notes (Signed)
Patient left facility before receiving discharge papers, but was discharged by physician.

## 2022-08-31 NOTE — Discharge Instructions (Signed)
Follow up with dr. Constance Haw as planned

## 2022-08-31 NOTE — Progress Notes (Signed)
[facility]  Patient called office and was worried about her skin / having purple color changes at her sternum per report and felt a sensation of something moving inferior.   BP 123/75   Pulse 73   Temp 98 F (36.7 C) (Oral)   Resp 17   Ht '5\' 7"'$  (1.702 m)   Wt 122.1 kg   SpO2 96%   BMI 42.16 kg/m    Bruising from sternum evolving and traveling dependently, incision looks good, no erythema or drainage at staple line, bruising evolving here and flaps swollen, JP drain with SS fluid, less bloody than prior  Continue to monitor. No signs of infection.  Will see her on Tuesday.  Do not think she needs any further work up.   Will notify the ED.   Future Appointments  Date Time Provider [place]  09/05/2022 11:15 AM Virl Cagey, MD [facility] None  10/02/2022  8:15 AM Derek Jack, MD [facility] None  10/30/2022  1:20 PM Johnette Abraham, MD [facility] [place], MD [facility] [address] [place], [place] [id number] [phone] (office)

## 2022-08-31 NOTE — Transitions of Care (Post Inpatient/ED Visit) (Signed)
   08/31/2022  Name: Diana Jensen MRN: [id number] DOB: 1975/11/03  Today's TOC FU Call Status: Today's TOC FU Call Status:: Successful TOC FU Call Competed TOC FU Call Complete Date: 08/31/22  Transition Care Management Follow-up Telephone Call Date of Discharge: 08/30/22 [facility] Type of Discharge: Inpatient Admission How have you been since you were released from the hospital?: Better  Items Reviewed: Did you receive and understand the discharge instructions provided?: Yes Medications obtained and verified?: Yes (Medications Reviewed) Any new allergies since your discharge?: No Dietary orders reviewed?: Yes Do you have support at home?: Yes People in Home: spouse  Home Care and Equipment/Supplies: [place] Ordered?: NA Any new equipment or medical supplies ordered?: NA  Functional Questionnaire: Do you need assistance with bathing/showering or dressing?: No Do you need assistance with meal preparation?: No Do you need assistance with eating?: No Do you have difficulty maintaining continence: No Do you need assistance with getting out of bed/getting out of a chair/moving?: No  Folllow up appointments reviewed: PCP Follow-up appointment confirmed?: NA Specialist Hospital Follow-up appointment confirmed?: Yes Date of Specialist follow-up appointment?: 09/05/22 Follow-Up Specialty Provider:: Dr Constance Haw Do you need transportation to your follow-up appointment?: No Do you understand care options if your condition(s) worsen?: Yes-patient verbalized understanding    [place], [place] Direct Dial [phone]

## 2022-08-31 NOTE — Telephone Encounter (Signed)
Surgical Date: 08/28/2022 Procedure: Simple Mastectomy with Axillary Sentinel Node Bx, Left  Received call from patient [phone]~ telephone.   Patient reports that she noted discharge surrounding drain tubing this morning. States that fluid is bright red. States that there is not copious amounts of blood noted to be draining, but there is enough to notice. Denies warmth to touch, tenderness to touch, or spreading redness to surrounding skin. Denies fever/ chills, or purulent drainage.   Patient states that this afternoon, she noted warm sensation like fluid traveling down her sternum and then to navel. States that sensation is internal. Reports that skin is becoming discolored on top of sternum and is blue/ purple in color.   Advised to go to ER for evaluation.   Dr. Constance Haw to be made aware.

## 2022-08-31 NOTE — ED Triage Notes (Signed)
States she had a mastectomy on Monday.  Today she changed her compression dressing, felt a sensation like water running down her abdomen. It is now ecchymosed.  Pt denies any pain or SOB at time of triage

## 2022-09-01 LAB — SURGICAL PATHOLOGY

## 2022-09-02 NOTE — Telephone Encounter (Signed)
[facility]  Updated patient. Breast cancer pathology   Pathology: A. BREAST, LEFT, SIMPLE MASTECTOMY:  Invasive ductal carcinoma, 1.6 cm, grade 2 (pT1c)  Ductal carcinoma in situ: Multifocal, intermediate nuclear grade  Margins, invasive: Negative      Closest, invasive: 34 mm, anterior-inferior  Margins, DCIS: Negative      Closest, DCIS: 34 mm, anterior-inferior  Lymphovascular invasion: Present  Prognostic markers:  ER positive, PR positive, Her2 negative, Ki-67 10%  Other: Biopsy site and biopsy clips  See oncology table   B. 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 She expressed understanding. Reports her bruising is evolving.  Curlene Labrum, MD [facility] [address] [place], [place] [address] [phone] (office)

## 2022-09-04 ENCOUNTER — Telehealth: Payer: Self-pay

## 2022-09-04 NOTE — Transitions of Care (Post Inpatient/ED Visit) (Signed)
   09/04/2022  Name: Diana Jensen MRN: [id number] DOB: 11-29-1975  Today's TOC FU Call Status: Today's TOC FU Call Status:: Successful TOC FU Call Competed TOC FU Call Complete Date: 09/04/22  Transition Care Management Follow-up Telephone Call Date of Discharge: 08/31/22 [facility] Type of Discharge: Emergency Department Reason for ED Visit: Other: (Visit for wound check) How have you been since you were released from the hospital?: Better Any questions or concerns?: No  Items Reviewed: Did you receive and understand the discharge instructions provided?: Yes Medications obtained and verified?: Yes (Medications Reviewed) Any new allergies since your discharge?: No Dietary orders reviewed?: NA Do you have support at home?: No  Home Care and Equipment/Supplies: [place] Ordered?: NA Any new equipment or medical supplies ordered?: NA  Functional Questionnaire: Do you need assistance with bathing/showering or dressing?: No Do you need assistance with meal preparation?: No Do you need assistance with eating?: No Do you have difficulty maintaining continence: No Do you need assistance with getting out of bed/getting out of a chair/moving?: No Do you have difficulty managing or taking your medications?: No  Folllow up appointments reviewed: PCP Follow-up appointment confirmed?: No (specialist) MD Provider Line Number:[phone] Given: Yes [facility] Follow-up appointment confirmed?: Yes Date of Specialist follow-up appointment?: 09/05/22 Follow-Up Specialty Provider:: Dr Constance Haw Do you need transportation to your follow-up appointment?: No Do you understand care options if your condition(s) worsen?: Yes-patient verbalized understanding    [place] LPN [place] Direct Dial [phone]

## 2022-09-05 ENCOUNTER — Encounter: Payer: Self-pay | Admitting: General Surgery

## 2022-09-05 ENCOUNTER — Ambulatory Visit (INDEPENDENT_AMBULATORY_CARE_PROVIDER_SITE_OTHER): Payer: Managed Care, Other (non HMO) | Admitting: General Surgery

## 2022-09-05 VITALS — BP 137/85 | HR 84 | Temp 98.1°F | Resp 14 | Ht 67.0 in | Wt 265.0 lb

## 2022-09-05 DIAGNOSIS — C50112 Malignant neoplasm of central portion of left female breast: Secondary | ICD-10-CM

## 2022-09-05 DIAGNOSIS — Z170 Estrogen receptor positive status [ER+]: Secondary | ICD-10-CM

## 2022-09-05 MED ORDER — OXYCODONE HCL 5 MG PO TABS: 5.0000 mg | ORAL_TABLET | ORAL | 0 refills | Status: DC | PRN

## 2022-09-05 NOTE — Progress Notes (Unsigned)
[facility]  Future Appointments  Date Time Provider [place]  09/12/2022  9:15 AM Virl Cagey, MD [facility] None  10/02/2022  8:15 AM Derek Jack, MD [facility] None  10/30/2022  1:20 PM Johnette Abraham, MD [facility] R[facility]

## 2022-09-05 NOTE — Patient Instructions (Addendum)
Continue JP drain care and can wear binder as you are active. If at home can wear something looser.   Call with concerns.

## 2022-09-08 ENCOUNTER — Encounter (HOSPITAL_COMMUNITY): Payer: Self-pay | Admitting: General Surgery

## 2022-09-12 ENCOUNTER — Ambulatory Visit (INDEPENDENT_AMBULATORY_CARE_PROVIDER_SITE_OTHER): Payer: Managed Care, Other (non HMO) | Admitting: General Surgery

## 2022-09-12 ENCOUNTER — Encounter: Payer: Self-pay | Admitting: General Surgery

## 2022-09-12 VITALS — BP 121/82 | HR 75 | Temp 98.4°F | Resp 14 | Ht 67.0 in | Wt 267.0 lb

## 2022-09-12 DIAGNOSIS — Z170 Estrogen receptor positive status [ER+]: Secondary | ICD-10-CM

## 2022-09-12 DIAGNOSIS — C50112 Malignant neoplasm of central portion of left female breast: Secondary | ICD-10-CM

## 2022-09-12 NOTE — Patient Instructions (Signed)
Steristrips will peel off in the next 5-7 days. You can remove them once they are peeling off. It is ok to shower. Pat the area dry.  Drain site dressing to stay on 48 hours unless it is so saturated then you can change it. Ok to shower and pat area dry.

## 2022-09-13 NOTE — Progress Notes (Signed)
[facility]  JP with about 10cc of dark old blood drainage daily. Bruising and swelling improving.    BP 121/82   Pulse 75   Temp 98.4 F (36.9 C) (Oral)   Resp 14   Ht 5\' 7"  (1.702 m)   Wt 267 lb (121.1 kg)   SpO2 95%   BMI 41.82 kg/m  Mastectomy site with less edema and bruising, flaps remain very swollen at this time, JP removed without issue, dressing placed, staple removed and steri strips placed, no erythema or drainage   Patient s/p left mastectomy and +SLNB . Doing fair. Bruising and swelling improving. Will make sure she gets in with Dr. Delton Coombes. Will see her next week to check on her swelling and bruising again.  Steristrips will peel off in the next 5-7 days. You can remove them once they are peeling off. It is ok to shower. Pat the area dry.  Drain site dressing to stay on 48 hours unless it is so saturated then you can change it. Ok to shower and pat area dry.   Future Appointments  Date Time Provider [place]  09/21/2022  1:30 PM Virl Cagey, MD [facility] None  10/02/2022  8:15 AM Derek Jack, MD [facility] None  10/30/2022  1:20 PM Johnette Abraham, MD [facility] [place], MD [facility] [address] [place], [place] [address] [phone] (office)

## 2022-09-19 ENCOUNTER — Other Ambulatory Visit: Payer: Self-pay

## 2022-09-19 ENCOUNTER — Telehealth: Payer: Self-pay | Admitting: Internal Medicine

## 2022-09-19 DIAGNOSIS — E559 Vitamin D deficiency, unspecified: Secondary | ICD-10-CM

## 2022-09-19 NOTE — Telephone Encounter (Signed)
Pt called in regard to Vit D States that she was advised by pcp to come in for labs once finished with current Vit D supply.  Pt has finished and will; have labs done with oncology on 4/8 wants to see if can wait until those labs of if she needs to come have lab by pcp. Wants a call back.

## 2022-09-19 NOTE — Telephone Encounter (Signed)
Orders placed and if order needs to be changing once she gets there we will change it.

## 2022-09-21 ENCOUNTER — Other Ambulatory Visit: Payer: Self-pay

## 2022-09-21 ENCOUNTER — Encounter: Payer: Self-pay | Admitting: General Surgery

## 2022-09-21 ENCOUNTER — Ambulatory Visit: Payer: Managed Care, Other (non HMO) | Admitting: General Surgery

## 2022-09-21 VITALS — BP 122/85 | HR 84 | Temp 98.3°F | Resp 20 | Ht 67.0 in | Wt 266.0 lb

## 2022-09-21 DIAGNOSIS — C50112 Malignant neoplasm of central portion of left female breast: Secondary | ICD-10-CM

## 2022-09-21 DIAGNOSIS — Z170 Estrogen receptor positive status [ER+]: Secondary | ICD-10-CM

## 2022-09-21 DIAGNOSIS — T148XXA Other injury of unspecified body region, initial encounter: Secondary | ICD-10-CM

## 2022-09-21 NOTE — Patient Instructions (Signed)
Keep bandaid on for at least 48 hours. Keep compression on the left mastectomy site.  Call with any worsening swelling, drainage or changes.

## 2022-09-21 NOTE — Progress Notes (Signed)
[facility]  Doing better. Less sore but feels like she does have some more swelling in the upper part of the breast. JP drain site healing, no drainage. Incision healing.  BP 122/85   Pulse 84   Temp 98.3 F (36.8 C) (Oral)   Resp 20   Ht 5\' 7"  (1.702 m)   Wt 266 lb (120.7 kg)   SpO2 95%   BMI 41.66 kg/m  Upper breast flap with fluctuance like hematoma/seroma, some bruising on flap Betadine applied and aspirated 180cc of hematoma, neosporin and bandaid placed  Induration and bruising laterally improving, with the aspirated hematoma, flaps and medial aspect much less swollen   Patient s/p left mastectomy and SLNB. Doing fair but did have significant swelling of her flaps and now has liquefying hematoma. I have aspirated 180cc of dark old hematoma. She feels like this has improved her soreness.   Patient called me later in the afternoon and had to change her bandaid due to continued drainage. Recommended a folded ABD pad and tape to apply pressure. Will follow up on if this continues to drain.  Future Appointments  Date Time Provider [place]  10/02/2022  8:15 AM Derek Jack, MD [facility] None  10/03/2022  2:00 PM Virl Cagey, MD [facility] None  10/30/2022  1:20 PM Johnette Abraham, MD [facility] [place], MD [facility] [address] [place], [place] [address] [phone] (office)

## 2022-10-01 DIAGNOSIS — C50212 Malignant neoplasm of upper-inner quadrant of left female breast: Secondary | ICD-10-CM | POA: Insufficient documentation

## 2022-10-02 ENCOUNTER — Encounter: Payer: Self-pay | Admitting: Hematology

## 2022-10-02 ENCOUNTER — Ambulatory Visit (HOSPITAL_COMMUNITY)
Admission: RE | Admit: 2022-10-02 | Discharge: 2022-10-02 | Disposition: A | Discharge status: Home / Self Care | Payer: Managed Care, Other (non HMO) | Source: Ambulatory Visit | Attending: Hematology | Admitting: Hematology

## 2022-10-02 ENCOUNTER — Inpatient Hospital Stay: Payer: Managed Care, Other (non HMO) | Attending: Hematology | Admitting: Hematology

## 2022-10-02 ENCOUNTER — Other Ambulatory Visit (HOSPITAL_COMMUNITY): Payer: Self-pay | Admitting: Hematology

## 2022-10-02 VITALS — BP 142/94 | HR 83 | Temp 98.0°F | Resp 16 | Ht 67.0 in | Wt 270.2 lb

## 2022-10-02 DIAGNOSIS — C50212 Malignant neoplasm of upper-inner quadrant of left female breast: Secondary | ICD-10-CM | POA: Insufficient documentation

## 2022-10-02 DIAGNOSIS — F17210 Nicotine dependence, cigarettes, uncomplicated: Secondary | ICD-10-CM | POA: Insufficient documentation

## 2022-10-02 DIAGNOSIS — Z9012 Acquired absence of left breast and nipple: Secondary | ICD-10-CM | POA: Diagnosis not present

## 2022-10-02 DIAGNOSIS — Z90710 Acquired absence of both cervix and uterus: Secondary | ICD-10-CM | POA: Insufficient documentation

## 2022-10-02 DIAGNOSIS — Z803 Family history of malignant neoplasm of breast: Secondary | ICD-10-CM | POA: Insufficient documentation

## 2022-10-02 DIAGNOSIS — Z808 Family history of malignant neoplasm of other organs or systems: Secondary | ICD-10-CM | POA: Diagnosis not present

## 2022-10-02 DIAGNOSIS — Z800 Family history of malignant neoplasm of digestive organs: Secondary | ICD-10-CM | POA: Diagnosis not present

## 2022-10-02 DIAGNOSIS — Z170 Estrogen receptor positive status [ER+]: Secondary | ICD-10-CM | POA: Insufficient documentation

## 2022-10-02 DIAGNOSIS — C50912 Malignant neoplasm of unspecified site of left female breast: Secondary | ICD-10-CM

## 2022-10-02 NOTE — Progress Notes (Addendum)
Not able to perform echocardiogram due to recent left mastectomy with a very large and tight compression dressing with extreme tenderness at the site.  Spoke with Tommi RN at cancer center.      Celesta Gentile, RCS

## 2022-10-02 NOTE — Progress Notes (Signed)
[facility] [address], [place] [address]   Clinic Day:  10/02/2022  Referring physician: [name], MD  Patient Care Team: [name], MD as PCP - General (Internal Medicine)   ASSESSMENT & PLAN:   Assessment:  1.  T1cN1 G2 left breast IDC, ER/PR positive, HER2 negative: - She felt a lump in her breast in December 2023. - Left breast subareolar mass at 9:00 biopsy (08/10/2022): grade 2 IDC, ER 95% strong intensity, PR 90% strong intensity, Ki-67 10%, HER2 1+ - Left mastectomy and SLNB (08/28/2022): 1.6 x 1.2 x 0.7 cm IDC, margins negative, LVI present, 1/1 lymph node with metastatic carcinoma, metastasis 15 mm, focal extracapsular extension.  PT1 cpN1a. 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  No prior history of arterial/venous thrombosis/strokes.  2.  Social/family history: - Seen today with her husband and daughter.  She worked as a Conservation officer, nature.  Current active smoker, 5 to 7 cigarettes/day, started at age 46. - Mother had breast cancer at age 35.  Father had skin cancer.  Paternal grandfather had colon cancer.  Maternal grandfather had prostate cancer.  1 maternal cousin had pancreatic cancer.  2 paternal cousins have cancer.  Plan:  1.  T1cN1 G2 left breast IDC, ER/PR positive, HER2 negative: - We discussed the pathology report in detail. - Recommend PET CT scan for further staging. - Recommend genetic testing. - I have recommended adjuvant chemotherapy with dose dense AC followed by weekly paclitaxel. - We will obtain baseline echocardiogram. - We will request port placement by Dr. Henreitta Leber. - Patient has a seroma on the left breast and had recent aspiration of fluid.  She has a follow-up with Dr. Henreitta Leber tomorrow. - We will tentatively start her chemotherapy in 2 weeks.   Orders Placed This Encounter  Procedures   NM PET Image Initial (PI) Skull Base To Thigh    Standing Status:   Future    Standing Expiration Date:    10/02/2023    Order Specific Question:   If indicated for the ordered procedure, I authorize the administration of a radiopharmaceutical per Radiology protocol    Answer:   Yes    Order Specific Question:   Is the patient pregnant?    Answer:   No    Order Specific Question:   Preferred imaging location?    Answer:   [facility]   Genetic Screening Order    Standing Status:   Future    Standing Expiration Date:   10/02/2023   Magnesium    Standing Status:   Future    Standing Expiration Date:   10/17/2023   CBC with Differential    Standing Status:   Future    Standing Expiration Date:   10/17/2023   Comprehensive metabolic panel    Standing Status:   Future    Standing Expiration Date:   10/17/2023   Magnesium    Standing Status:   Future    Standing Expiration Date:   10/31/2023   CBC with Differential    Standing Status:   Future    Standing Expiration Date:   10/31/2023   Comprehensive metabolic panel    Standing Status:   Future    Standing Expiration Date:   10/31/2023   Magnesium    Standing Status:   Future    Standing Expiration Date:   11/14/2023   CBC with Differential    Standing Status:   Future  Standing Expiration Date:   11/14/2023   Comprehensive metabolic panel    Standing Status:   Future    Standing Expiration Date:   11/14/2023   Magnesium    Standing Status:   Future    Standing Expiration Date:   11/28/2023   CBC with Differential    Standing Status:   Future    Standing Expiration Date:   11/28/2023   Comprehensive metabolic panel    Standing Status:   Future    Standing Expiration Date:   11/28/2023      I,Alexis Herring,acting as a scribe for [name], MD.,have documented all relevant documentation on the behalf of [name], MD,as directed by  [name], MD while in the presence of [name], MD.   I, [name] MD, have reviewed the above documentation for accuracy and completeness, and I agree with the  above.   [name], MD   4/8/20246:48 PM  CHIEF COMPLAINT/PURPOSE OF CONSULT:   Diagnosis: left breast cancer   Cancer Staging  Breast cancer of upper-inner quadrant of left female breast Staging form: Breast, AJCC 8th Edition - Clinical stage from 10/01/2022: Stage IB (cT1c, cN1, cM0, G2, ER+, PR+, HER2-) - Signed by [name], MD on 10/01/2022    Prior Therapy: left mastectomy and +SLNB with Dr. Algis Greenhouse on 08/28/22  Current Therapy: Adjuvant chemotherapy with dose dense AC followed by weekly paclitaxel   HISTORY OF PRESENT ILLNESS:   Oncology History  Breast cancer of upper-inner quadrant of left female breast  10/01/2022 Initial Diagnosis   Breast cancer of upper-inner quadrant of left female breast   10/01/2022 Cancer Staging   Staging form: Breast, AJCC 8th Edition - Clinical stage from 10/01/2022: Stage IB (cT1c, cN1, cM0, G2, ER+, PR+, HER2-) - Signed by [name], MD on 10/01/2022 Histopathologic type: Infiltrating duct carcinoma, NOS Stage prefix: Initial diagnosis Nuclear grade: G2 Histologic grading system: 3 grade system   10/17/2022 -  Chemotherapy   Patient is on Treatment Plan : BREAST ADJUVANT DOSE DENSE AC q14d / PACLitaxel q7d         Diana Jensen is a 47 y.o. female presenting to clinic today for evaluation of left breast cancer s/p left mastectomy on 08/28/22 at the request of Dr. Algis Greenhouse. She is accompanied by her daughter and husband today.  She states that she first noticed a lump in her breast around Christmas 2023. She has a h/o fibrous nodules in her breast but they had never persisted as long as this lump prompting her to see her PCP in January 2024. She then had a mammogram/US on 07/27/22 with a biopsy on 2/15. She had not had any biopsies in the past but had her last normal mammo in 08/2021.  She reports menarche at age 76. She had a partial hysterectomy in 2011 due to menorrhagia. She has 2 living children and had her  first child (her son) at age 76. She has 1 daughter and 1 son. She had 3 miscarriages prior to having her son- with her longest pregnancy lasting 14 weeks. She was found to have antiphospholipids disorder after her miscarriages. Therefore, she took Heparin shots while pregnant with her son and Lovenox shots while pregnant with her daughter.  She denies any family history of antiphospholipids disorder. Her mother had breast cancer at age 63. Her father had areas of skin cancer which were removed but she is unsure of what type. Her paternal grandfather had colon cancer with mets. Her maternal  grandfather had metastatic prostate cancer. She reports a maternal cousin who passed away from pancreatic cancer- he was a IT sales professional. She has 2 paternal cousins with unknown types of cancers.  She denies any h/o DVT, PE, MI, or strokes. She does not take any aspirin or anticoagulants- she did have a Lovenox injection on the day of her surgery.  She has smoked since she was 47yo. She now smokes 5-7 cigarettes a day., she was smoking 1ppd until last year.  Today, she states that she is doing okay. She does still have some discomfort and swelling overlying her surgical incision- she had a seroma drained last week with 180cc of aspirate. She is scheduled to see Dr. Henreitta Leber again tomorrow for repeat aspiration.  Her appetite level is at 70%. Her energy level is at 50%. She denies any new pains since her diagnosis. She denies any numbness/tingling in her hands and feet. She denies any ankle swelling.  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  PAST MEDICAL HISTORY:   Past Medical History: Past Medical History:  Diagnosis Date   Allergy    seasonal   Antiphospholipid syndrome    Anxiety    Arthritis    deterioration of spine, knees   Carpal tunnel syndrome of right wrist    Clotting disorder    antiphospholipid syndrome   Depression    Encounter for general adult medical examination with  abnormal findings 07/03/2022   Essential hypertension, benign 02/10/2020   Hyperlipidemia 08/02/2022   Sleep disorder 02/22/2016    Surgical History: Past Surgical History:  Procedure Laterality Date   BREAST BIOPSY Left 08/10/2022   [place] LT BREAST BX W LOC DEV 1ST LESION IMG BX SPEC US GUIDE 08/10/2022 [facility]   BREAST BIOPSY Left 08/18/2022   MM LT BREAST BX W LOC DEV EA AD LESION IMG BX SPEC STEREO GUIDE 08/18/2022 [facility]   BREAST BIOPSY Left 08/18/2022   MM LT BREAST BX W LOC DEV 1ST LESION IMAGE BX SPEC STEREO GUIDE 08/18/2022 [facility]   CARPAL TUNNEL RELEASE Right 01/09/2019   Procedure: RIGHT CARPAL TUNNEL RELEASE;  Surgeon: Vickki Hearing, MD;  Location: [facility];  Service: Orthopedics;  Laterality: Right;   DILATION AND CURETTAGE OF UTERUS     x2   EYE SURGERY     lazy eye   PARTIAL HYSTERECTOMY  2011   SIMPLE MASTECTOMY WITH AXILLARY SENTINEL NODE BIOPSY Left 08/28/2022   Procedure: SIMPLE MASTECTOMY WITH AXILLARY SENTINEL NODE BIOPSY;  Surgeon: [name], MD;  Location: [facility];  Service: General;  Laterality: Left;    Social History: Social History   Socioeconomic History   Marital status: Married    Spouse name: Not on file   Number of children: 2   Years of education: Not on file   Highest education level: Not on file  Occupational History   Not on file  Tobacco Use   Smoking status: Every Day    Packs/day: 0.50    Years: 28.00    Additional pack years: 0.00    Total pack years: 14.00    Types: Cigarettes   Smokeless tobacco: Never   Tobacco comments:    She has been smoking since age 67, smokes half a pack daily,was doing one pack a day until last year   Vaping Use   Vaping Use: Never used  Substance and Sexual Activity   Alcohol use: No   Drug use: No   Sexual activity: Yes  Birth control/protection: Surgical  Other Topics Concern   Not on file  Social History Narrative   Associates degree in Acupuncturist. 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 Has 2 children 1 son and 1 daughter. Married x 15 years.   Social Determinants of Health   Financial Resource Strain: Not on file  Food Insecurity: No Food Insecurity (08/30/2022)   Hunger Vital Sign    Worried About Running Out of Food in the Last Year: Never true    Ran Out of Food in the Last Year: Never true  Transportation Needs: No Transportation Needs (08/30/2022)   PRAPARE - Administrator, Civil Service (Medical): No    Lack of Transportation (Non-Medical): No  Physical Activity: Not on file  Stress: Not on file  Social Connections: Not on file  Intimate Partner Violence: Not At Risk (08/30/2022)   Humiliation, Afraid, Rape, and Kick questionnaire    Fear of Current or Ex-Partner: No    Emotionally Abused: No    Physically Abused: No    Sexually Abused: No    Family History: Family History  Problem Relation Age of Onset   Cancer Mother 90       breast    Arthritis Mother    Hypertension Mother    Osteoporosis Mother    Thyroid disease Father    Hypertension Father    Skin cancer Father    Mental illness Sister        depression ? bipolar   Dementia Maternal Grandmother    Cancer Maternal Grandfather        prostate   Heart disease Paternal Grandmother    Cancer Paternal Grandfather        colon   Colon cancer Paternal Grandfather    Mental illness Daughter    Pancreatic cancer Cousin        maternal cousin   Cancer Cousin        paternal, unknown type   Cancer Cousin        paternal, unknown type   Cancer - Lung Neg Hx    Cervical cancer Neg Hx     Current Medications:  Current Outpatient Medications:    acetaminophen (TYLENOL) 500 MG tablet, Take 1,000 mg by mouth every 6 (six) hours as needed for headache., Disp: , Rfl:    escitalopram (LEXAPRO) 10 MG tablet, Take 1 tablet (10 mg total) by mouth daily., Disp: 90 tablet, Rfl: 0   hydrOXYzine (VISTARIL) 50 MG capsule,  Take 1 capsule (50 mg total) by mouth daily as needed for anxiety., Disp: 30 capsule, Rfl: 2   losartan (COZAAR) 25 MG tablet, Take 1 tablet (25 mg total) by mouth daily., Disp: 90 tablet, Rfl: 1   oxyCODONE (OXY IR/ROXICODONE) 5 MG immediate release tablet, Take 1-2 tablets (5-10 mg total) by mouth every 4 (four) hours as needed for severe pain or breakthrough pain., Disp: 15 tablet, Rfl: 0   ondansetron (ZOFRAN-ODT) 4 MG disintegrating tablet, Take 1 tablet (4 mg total) by mouth every 6 (six) hours as needed for nausea. (Patient not taking: Reported on 10/02/2022), Disp: 20 tablet, Rfl: 0   Allergies: No Known Allergies  REVIEW OF SYSTEMS:   Review of Systems  Constitutional:  Negative for chills, fatigue and fever.  HENT:   Negative for lump/mass, mouth sores, nosebleeds, sore throat and trouble swallowing.   Eyes:  Negative for eye problems.  Respiratory:  Negative for cough and shortness of breath.  Cardiovascular:  Negative for chest pain, leg swelling and palpitations.  Gastrointestinal:  Positive for nausea. Negative for abdominal pain, constipation, diarrhea and vomiting.  Genitourinary:  Negative for bladder incontinence, difficulty urinating, dysuria, frequency, hematuria and nocturia.   Musculoskeletal:  Positive for myalgias (left breast with swelling. 3/10 in severity). Negative for arthralgias, back pain, flank pain and neck pain.  Skin:  Negative for itching and rash.  Neurological:  Negative for dizziness, headaches and numbness.  Hematological:  Does not bruise/bleed easily.  Psychiatric/Behavioral:  Positive for depression. Negative for sleep disturbance and suicidal ideas. The patient is not nervous/anxious.   All other systems reviewed and are negative.    VITALS:   Blood pressure (!) 142/94, pulse 83, temperature 98 F (36.7 C), temperature source Oral, resp. rate 16, height 5\' 7"  (1.702 m), weight 270 lb 3.2 oz (122.6 kg), SpO2 98 %.  Wt Readings from Last 3  Encounters:  10/02/22 270 lb 3.2 oz (122.6 kg)  09/21/22 266 lb (120.7 kg)  09/12/22 267 lb (121.1 kg)    Body mass index is 42.32 kg/m.  Performance status (ECOG): 0 - Asymptomatic  PHYSICAL EXAM:   Physical Exam Vitals and nursing note reviewed. Exam conducted with a chaperone present.  Constitutional:      Appearance: Normal appearance.  Cardiovascular:     Rate and Rhythm: Normal rate and regular rhythm.     Pulses: Normal pulses.     Heart sounds: Normal heart sounds.  Pulmonary:     Effort: Pulmonary effort is normal.     Breath sounds: Normal breath sounds.  Chest:     Comments: Left breast swelling along medial incision. Abdominal:     Palpations: Abdomen is soft. There is no hepatomegaly, splenomegaly or mass.     Tenderness: There is no abdominal tenderness.  Musculoskeletal:     Right lower leg: No edema.     Left lower leg: No edema.  Lymphadenopathy:     Cervical: No cervical adenopathy.     Right cervical: No superficial, deep or posterior cervical adenopathy.    Left cervical: No superficial, deep or posterior cervical adenopathy.     Upper Body:     Right upper body: No supraclavicular or axillary adenopathy.     Left upper body: No supraclavicular or axillary adenopathy.  Neurological:     General: No focal deficit present.     Mental Status: She is alert and oriented to person, place, and time.  Psychiatric:        Mood and Affect: Mood normal.        Behavior: Behavior normal.    Breast Exam Chaperone: Chapman Moss, RN   LABS:      Latest Ref Rng & Units 08/30/2022    4:50 AM 08/29/2022    4:39 AM 08/10/2022    9:08 AM  CBC  WBC 4.0 - 10.5 K/uL 11.1  14.8  7.9   Hemoglobin 12.0 - 15.0 g/dL 16.1  09.6  04.5   Hematocrit 36.0 - 46.0 % 33.3  35.2  43.7   Platelets 150 - 400 K/uL 305  304  296       Latest Ref Rng & Units 08/30/2022    4:50 AM 08/29/2022    4:39 AM 08/10/2022    9:08 AM  CMP  Glucose 70 - 99 mg/dL 409  811  914   BUN 6 -  20 mg/dL 14  15  13    Creatinine 0.44 - 1.00 mg/dL 7.82  0.75  1.00   Sodium 135 - 145 mmol/L 140  136  139   Potassium 3.5 - 5.1 mmol/L 3.9  4.5  3.4   Chloride 98 - 111 mmol/L 107  105  105   CO2 22 - 32 mmol/L 24  22  22    Calcium 8.9 - 10.3 mg/dL 8.4  8.3  8.7   Total Protein 6.5 - 8.1 g/dL   7.0   Total Bilirubin 0.3 - 1.2 mg/dL   0.9   Alkaline Phos 38 - 126 U/L   86   AST 15 - 41 U/L   42   ALT 0 - 44 U/L   65      No results found for: "CEA1", "CEA" / No results found for: "CEA1", "CEA" No results found for: "PSA1" No results found for: "[id number]" No results found for: "CAN125"  No results found for: "TOTALPROTELP", "ALBUMINELP", "A1GS", "A2GS", "BETS", "BETA2SER", "GAMS", "MSPIKE", "SPEI" No results found for: "TIBC", "FERRITIN", "IRONPCTSAT" No results found for: "LDH"  Pathology 08/10/22 FINAL MICROSCOPIC DIAGNOSIS:   A. 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 BREAST, LEFT, SIMPLE MASTECTOMY:  Invasive ductal carcinoma, 1.6 cm, grade 2 (pT1c)  Ductal carcinoma in situ: Multifocal, intermediate nuclear grade  Margins, invasive: Negative      Closest, invasive: 34 mm, anterior-inferior  Margins, DCIS: Negative      Closest, DCIS: 34 mm, anterior-inferior  Lymphovascular invasion: Present  Prognostic markers:  ER positive, PR positive, Her2 negative, Ki-67 10%  Other: Biopsy site and biopsy clips  See oncology table   B. LYMPH  NODE, SENTINEL, LEFT AXILLARY, BIOPSY:  Lymph node with metastatic carcinoma (1/1) (pN1a)  Metastasis is 15 mm  Focal extracapsular extension   STUDIES:   No results found.

## 2022-10-02 NOTE — Patient Instructions (Addendum)
[facility] at [facility] Discharge Instructions   You were seen and examined today by Dr. Ellin Saba. He is a Systems developer who is seeing you today regarding your recent diagnosis of breast cancer. This cancer is considered a Stage I disease.   He discussed with you starting treatment on a combination of chemotherapy drugs. Those drugs are Adriamycin, Cytoxan, and Taxol. Treatment is in two phases. The first phase you will come to the clinic every 2 weeks for a total of 4 times to receive Adriamycin and Cytoxan. After 4 of these treatments, you will then come weekly to the clinic to receive the Taxol. You will do this for a total of 12 weeks. The intent of treatment is to prevent the cancer from coming back.   We will arrange for you to have a PET scan to complete staging of this cancer. We will also arrange for you to have a echocardiogram to get a baseline of your heart function, as the Adriamycin can affect your heart's ability to pump. You will also need a port placed to administer the chemotherapy. We can arrange for this to be done with Dr. Henreitta Leber.   We will also refer you to radiation oncology to discuss radiation therapy after chemotherapy treatment is complete. We will plan to start you on an estrogen-blocking pill at that time, too.   Return as scheduled.    Thank you for choosing [facility] at [facility] to provide your oncology and hematology care.  To afford each patient quality time with our provider, please arrive at least 15 minutes before your scheduled appointment time.   If you have a lab appointment with the Cancer Center please come in thru the Main Entrance and check in at the main information desk.  You need to re-schedule your appointment should you arrive 10 or more minutes late.  We strive to give you quality time with our providers, and arriving late affects you and other patients whose appointments are after yours.   Also, if you no show three or more times for appointments you may be dismissed from the clinic at the providers discretion.     Again, thank you for choosing [facility].  Our hope is that these requests will decrease the amount of time that you wait before being seen by our physicians.       _____________________________________________________________  Should you have questions after your visit to [facility], please contact our office at [phone] and follow the prompts.  Our office hours are 8:00 a.m. and 4:30 p.m. Monday - Friday.  Please note that voicemails left after 4:00 p.m. may not be returned until the following business day.  We are closed weekends and major holidays.  You do have access to a nurse 24-7, just call the main number to the clinic [phone] and do not press any options, hold on the line and a nurse will answer the phone.    For prescription refill requests, have your pharmacy contact our office and allow 72 hours.    Due to Covid, you will need to wear a mask upon entering the hospital. If you do not have a mask, a mask will be given to you at the Main Entrance upon arrival. For doctor visits, patients may have 1 support person age 57 or older with them. For treatment visits, patients can not have anyone with them due to social distancing guidelines and our immunocompromised  population.

## 2022-10-02 NOTE — Progress Notes (Signed)
START ON PATHWAY REGIMEN - Breast     Cycles 1 through 4: A cycle is every 14 days:     Doxorubicin      Cyclophosphamide      [id number]    Cycles 5 through 16: A cycle is every 7 days:     Paclitaxel   **Always confirm dose/schedule in your pharmacy ordering system**  Patient Characteristics: Postoperative without Neoadjuvant Therapy, M0 (Pathologic Staging), Invasive Disease, Adjuvant Therapy, HER2 Negative, ER Positive, Node Positive, Node Positive (1-3), Genomic Testing Not Performed, Chemotherapy Candidate Therapeutic Status: Postoperative without Neoadjuvant Therapy, M0 (Pathologic Staging) AJCC Grade: G2 AJCC N Category: pN1 AJCC M Category: cM0 ER Status: Positive (+) AJCC 8 Stage Grouping: IA HER2 Status: Negative (-) Oncotype Dx Recurrence Score: Not Appropriate AJCC T Category: pT1c PR Status: Positive (+) Has this patient completed genomic testing<= No - Did Not Order Test  Intent of Therapy: Curative Intent, Discussed with Patient

## 2022-10-03 ENCOUNTER — Other Ambulatory Visit: Payer: Self-pay

## 2022-10-03 ENCOUNTER — Ambulatory Visit (INDEPENDENT_AMBULATORY_CARE_PROVIDER_SITE_OTHER): Payer: Managed Care, Other (non HMO) | Admitting: General Surgery

## 2022-10-03 ENCOUNTER — Encounter: Payer: Self-pay | Admitting: General Surgery

## 2022-10-03 VITALS — BP 130/74 | HR 78 | Temp 98.2°F | Resp 14 | Ht 67.0 in | Wt 270.0 lb

## 2022-10-03 DIAGNOSIS — I96 Gangrene, not elsewhere classified: Secondary | ICD-10-CM

## 2022-10-03 DIAGNOSIS — C50112 Malignant neoplasm of central portion of left female breast: Secondary | ICD-10-CM

## 2022-10-03 DIAGNOSIS — T148XXA Other injury of unspecified body region, initial encounter: Secondary | ICD-10-CM

## 2022-10-03 DIAGNOSIS — Z170 Estrogen receptor positive status [ER+]: Secondary | ICD-10-CM

## 2022-10-03 DIAGNOSIS — T86821 Skin graft (allograft) (autograft) failure: Secondary | ICD-10-CM | POA: Insufficient documentation

## 2022-10-03 NOTE — Patient Instructions (Signed)
Will plan for excision of the skin on the mastectomy site and clean out of the hematoma.  Will also place the port at the same time.

## 2022-10-04 ENCOUNTER — Encounter (HOSPITAL_COMMUNITY): Payer: Self-pay

## 2022-10-04 ENCOUNTER — Encounter (HOSPITAL_COMMUNITY)
Admission: RE | Admit: 2022-10-04 | Discharge: 2022-10-04 | Disposition: A | Discharge status: Home / Self Care | Payer: Managed Care, Other (non HMO) | Source: Ambulatory Visit | Attending: General Surgery | Admitting: General Surgery

## 2022-10-05 ENCOUNTER — Encounter (HOSPITAL_COMMUNITY)
Admission: RE | Disposition: A | Discharge status: Home / Self Care | Payer: Self-pay | Source: Home / Self Care | Attending: General Surgery

## 2022-10-05 ENCOUNTER — Ambulatory Visit (HOSPITAL_BASED_OUTPATIENT_CLINIC_OR_DEPARTMENT_OTHER): Payer: Managed Care, Other (non HMO) | Admitting: Certified Registered"

## 2022-10-05 ENCOUNTER — Ambulatory Visit (HOSPITAL_COMMUNITY): Payer: Managed Care, Other (non HMO)

## 2022-10-05 ENCOUNTER — Ambulatory Visit (HOSPITAL_COMMUNITY)
Admission: RE | Admit: 2022-10-05 | Discharge: 2022-10-05 | Disposition: A | Discharge status: Home / Self Care | Payer: Managed Care, Other (non HMO) | Attending: General Surgery | Admitting: General Surgery

## 2022-10-05 ENCOUNTER — Ambulatory Visit (HOSPITAL_COMMUNITY): Payer: Managed Care, Other (non HMO) | Admitting: Certified Registered"

## 2022-10-05 ENCOUNTER — Encounter (HOSPITAL_COMMUNITY): Payer: Managed Care, Other (non HMO)

## 2022-10-05 ENCOUNTER — Encounter (HOSPITAL_COMMUNITY): Payer: Self-pay | Admitting: General Surgery

## 2022-10-05 ENCOUNTER — Other Ambulatory Visit: Payer: Self-pay

## 2022-10-05 DIAGNOSIS — F17210 Nicotine dependence, cigarettes, uncomplicated: Secondary | ICD-10-CM | POA: Insufficient documentation

## 2022-10-05 DIAGNOSIS — Y838 Other surgical procedures as the cause of abnormal reaction of the patient, or of later complication, without mention of misadventure at the time of the procedure: Secondary | ICD-10-CM | POA: Diagnosis not present

## 2022-10-05 DIAGNOSIS — L7632 Postprocedural hematoma of skin and subcutaneous tissue following other procedure: Secondary | ICD-10-CM

## 2022-10-05 DIAGNOSIS — C50912 Malignant neoplasm of unspecified site of left female breast: Secondary | ICD-10-CM | POA: Diagnosis not present

## 2022-10-05 DIAGNOSIS — I10 Essential (primary) hypertension: Secondary | ICD-10-CM | POA: Insufficient documentation

## 2022-10-05 DIAGNOSIS — G709 Myoneural disorder, unspecified: Secondary | ICD-10-CM | POA: Insufficient documentation

## 2022-10-05 DIAGNOSIS — Z9012 Acquired absence of left breast and nipple: Secondary | ICD-10-CM | POA: Diagnosis not present

## 2022-10-05 DIAGNOSIS — Z170 Estrogen receptor positive status [ER+]: Secondary | ICD-10-CM

## 2022-10-05 DIAGNOSIS — F419 Anxiety disorder, unspecified: Secondary | ICD-10-CM | POA: Diagnosis not present

## 2022-10-05 DIAGNOSIS — F32A Depression, unspecified: Secondary | ICD-10-CM | POA: Diagnosis not present

## 2022-10-05 DIAGNOSIS — T148XXA Other injury of unspecified body region, initial encounter: Secondary | ICD-10-CM | POA: Diagnosis present

## 2022-10-05 DIAGNOSIS — I96 Gangrene, not elsewhere classified: Secondary | ICD-10-CM | POA: Diagnosis not present

## 2022-10-05 DIAGNOSIS — T86821 Skin graft (allograft) (autograft) failure: Secondary | ICD-10-CM | POA: Diagnosis present

## 2022-10-05 DIAGNOSIS — C50212 Malignant neoplasm of upper-inner quadrant of left female breast: Secondary | ICD-10-CM

## 2022-10-05 HISTORY — PX: IRRIGATION AND DEBRIDEMENT HEMATOMA: SHX5254

## 2022-10-05 HISTORY — PX: PORTACATH PLACEMENT: SHX2246

## 2022-10-05 SURGERY — INSERTION, TUNNELED CENTRAL VENOUS DEVICE, WITH PORT
Anesthesia: General | Site: Chest | Laterality: Right

## 2022-10-05 MED ORDER — OXYCODONE HCL 5 MG PO TABS: 5.0000 mg | ORAL_TABLET | ORAL | 0 refills | Status: DC | PRN

## 2022-10-05 MED ORDER — POVIDONE-IODINE 10 % EX OINT
TOPICAL_OINTMENT | CUTANEOUS | Status: DC | PRN
  Administered 2022-10-05: 1 via TOPICAL

## 2022-10-05 MED ORDER — SUCCINYLCHOLINE CHLORIDE 200 MG/10ML IV SOSY
PREFILLED_SYRINGE | INTRAVENOUS | Status: DC | PRN
  Administered 2022-10-05: 80 mg via INTRAVENOUS
  Administered 2022-10-05: 120 mg via INTRAVENOUS

## 2022-10-05 MED ORDER — ROCURONIUM BROMIDE 10 MG/ML (PF) SYRINGE
PREFILLED_SYRINGE | INTRAVENOUS | Status: AC
  Filled 2022-10-05: qty 10

## 2022-10-05 MED ORDER — FENTANYL CITRATE (PF) 100 MCG/2ML IJ SOLN
INTRAMUSCULAR | Status: AC
  Filled 2022-10-05: qty 2

## 2022-10-05 MED ORDER — EPHEDRINE SULFATE-NACL 50-0.9 MG/10ML-% IV SOSY
PREFILLED_SYRINGE | INTRAVENOUS | Status: DC | PRN
  Administered 2022-10-05: 5 mg via INTRAVENOUS

## 2022-10-05 MED ORDER — MIDAZOLAM HCL 2 MG/2ML IJ SOLN
INTRAMUSCULAR | Status: AC
  Filled 2022-10-05: qty 2

## 2022-10-05 MED ORDER — DEXMEDETOMIDINE HCL IN NACL 80 MCG/20ML IV SOLN
INTRAVENOUS | Status: DC | PRN
  Administered 2022-10-05: 8 ug via BUCCAL

## 2022-10-05 MED ORDER — CEFAZOLIN IN SODIUM CHLORIDE 3-0.9 GM/100ML-% IV SOLN
3.0000 g | INTRAVENOUS | Status: AC
  Administered 2022-10-05: 3 g via INTRAVENOUS
  Filled 2022-10-05: qty 100

## 2022-10-05 MED ORDER — CHLORHEXIDINE GLUCONATE CLOTH 2 % EX PADS: 6.0000 | MEDICATED_PAD | Freq: Once | CUTANEOUS | Status: DC

## 2022-10-05 MED ORDER — ONDANSETRON HCL 4 MG/2ML IJ SOLN
INTRAMUSCULAR | Status: DC | PRN
  Administered 2022-10-05: 4 mg via INTRAVENOUS

## 2022-10-05 MED ORDER — HEPARIN SOD (PORK) LOCK FLUSH 100 UNIT/ML IV SOLN
INTRAVENOUS | Status: AC
  Filled 2022-10-05: qty 5

## 2022-10-05 MED ORDER — LIDOCAINE HCL (PF) 1 % IJ SOLN
INTRAMUSCULAR | Status: DC | PRN
  Administered 2022-10-05: 10 mL via SUBCUTANEOUS
  Administered 2022-10-05: 6 mL via SUBCUTANEOUS

## 2022-10-05 MED ORDER — EPHEDRINE 5 MG/ML INJ
INTRAVENOUS | Status: AC
  Filled 2022-10-05: qty 5

## 2022-10-05 MED ORDER — FENTANYL CITRATE (PF) 250 MCG/5ML IJ SOLN
INTRAMUSCULAR | Status: DC | PRN
  Administered 2022-10-05: 50 ug via INTRAVENOUS
  Administered 2022-10-05 (×4): 25 ug via INTRAVENOUS
  Administered 2022-10-05: 50 ug via INTRAVENOUS

## 2022-10-05 MED ORDER — PROPOFOL 10 MG/ML IV BOLUS
INTRAVENOUS | Status: DC | PRN
  Administered 2022-10-05: 200 mg via INTRAVENOUS
  Administered 2022-10-05: 50 mg via INTRAVENOUS
  Administered 2022-10-05: 30 mg via INTRAVENOUS
  Administered 2022-10-05: 100 mg via INTRAVENOUS

## 2022-10-05 MED ORDER — DEXAMETHASONE SODIUM PHOSPHATE 10 MG/ML IJ SOLN
INTRAMUSCULAR | Status: AC
  Filled 2022-10-05: qty 1

## 2022-10-05 MED ORDER — LIDOCAINE HCL (PF) 1 % IJ SOLN
INTRAMUSCULAR | Status: AC
  Filled 2022-10-05: qty 30

## 2022-10-05 MED ORDER — PHENYLEPHRINE 80 MCG/ML (10ML) SYRINGE FOR IV PUSH (FOR BLOOD PRESSURE SUPPORT)
PREFILLED_SYRINGE | INTRAVENOUS | Status: DC | PRN
  Administered 2022-10-05: 160 ug via INTRAVENOUS
  Administered 2022-10-05 (×2): 80 ug via INTRAVENOUS
  Administered 2022-10-05: 160 ug via INTRAVENOUS

## 2022-10-05 MED ORDER — 0.9 % SODIUM CHLORIDE (POUR BTL) OPTIME
TOPICAL | Status: DC | PRN
  Administered 2022-10-05: 1000 mL

## 2022-10-05 MED ORDER — POVIDONE-IODINE 10 % EX OINT
TOPICAL_OINTMENT | CUTANEOUS | Status: AC
  Filled 2022-10-05: qty 1

## 2022-10-05 MED ORDER — SUCCINYLCHOLINE CHLORIDE 200 MG/10ML IV SOSY
PREFILLED_SYRINGE | INTRAVENOUS | Status: AC
  Filled 2022-10-05: qty 10

## 2022-10-05 MED ORDER — MIDAZOLAM HCL 2 MG/2ML IJ SOLN
INTRAMUSCULAR | Status: DC | PRN
  Administered 2022-10-05: 2 mg via INTRAVENOUS

## 2022-10-05 MED ORDER — SODIUM CHLORIDE (PF) 0.9 % IJ SOLN
INTRAMUSCULAR | Status: DC | PRN
  Administered 2022-10-05: 500 mL via INTRAVENOUS

## 2022-10-05 MED ORDER — SUGAMMADEX SODIUM 200 MG/2ML IV SOLN
INTRAVENOUS | Status: DC | PRN
  Administered 2022-10-05: 300 mg via INTRAVENOUS

## 2022-10-05 MED ORDER — LIDOCAINE HCL (PF) 2 % IJ SOLN
INTRAMUSCULAR | Status: AC
  Filled 2022-10-05: qty 5

## 2022-10-05 MED ORDER — LIDOCAINE 2% (20 MG/ML) 5 ML SYRINGE
INTRAMUSCULAR | Status: DC | PRN
  Administered 2022-10-05: 50 mg via INTRAVENOUS
  Administered 2022-10-05: 100 mg via INTRAVENOUS

## 2022-10-05 MED ORDER — ROCURONIUM BROMIDE 10 MG/ML (PF) SYRINGE
PREFILLED_SYRINGE | INTRAVENOUS | Status: DC | PRN
  Administered 2022-10-05: 30 mg via INTRAVENOUS
  Administered 2022-10-05: 10 mg via INTRAVENOUS

## 2022-10-05 MED ORDER — PROPOFOL 10 MG/ML IV BOLUS
INTRAVENOUS | Status: AC
  Filled 2022-10-05: qty 20

## 2022-10-05 MED ORDER — OXYCODONE HCL 5 MG PO TABS: 5.0000 mg | ORAL_TABLET | Freq: Once | ORAL | Status: DC | PRN

## 2022-10-05 MED ORDER — HEPARIN SOD (PORK) LOCK FLUSH 100 UNIT/ML IV SOLN
INTRAVENOUS | Status: DC | PRN
  Administered 2022-10-05: 500 [IU] via INTRAVENOUS

## 2022-10-05 MED ORDER — ONDANSETRON HCL 4 MG/2ML IJ SOLN
INTRAMUSCULAR | Status: AC
  Filled 2022-10-05: qty 2

## 2022-10-05 MED ORDER — FENTANYL CITRATE PF 50 MCG/ML IJ SOSY
25.0000 ug | PREFILLED_SYRINGE | INTRAMUSCULAR | Status: DC | PRN
  Administered 2022-10-05 (×2): 50 ug via INTRAVENOUS
  Filled 2022-10-05 (×2): qty 1

## 2022-10-05 MED ORDER — LACTATED RINGERS IV SOLN: INTRAVENOUS | Status: DC | PRN

## 2022-10-05 MED ORDER — ONDANSETRON HCL 4 MG/2ML IJ SOLN
4.0000 mg | Freq: Once | INTRAMUSCULAR | Status: AC | PRN
  Administered 2022-10-05: 4 mg via INTRAVENOUS
  Filled 2022-10-05: qty 2

## 2022-10-05 MED ORDER — AMOXICILLIN-POT CLAVULANATE 875-125 MG PO TABS: 1.0000 | ORAL_TABLET | Freq: Two times a day (BID) | ORAL | 0 refills | Status: AC

## 2022-10-05 MED ORDER — OXYCODONE HCL 5 MG/5ML PO SOLN: 5.0000 mg | Freq: Once | ORAL | Status: DC | PRN

## 2022-10-05 SURGICAL SUPPLY — 47 items
ADH SKN CLS APL DERMABOND .7 (GAUZE/BANDAGES/DRESSINGS) ×2
APL PRP STRL LF ISPRP CHG 10.5 (MISCELLANEOUS) ×2
APPLICATOR CHLORAPREP 10.5 ORG (MISCELLANEOUS) ×3 IMPLANT
BAG DECANTER FOR FLEXI CONT (MISCELLANEOUS) ×3 IMPLANT
BNDG GAUZE DERMACEA FLUFF 4 (GAUZE/BANDAGES/DRESSINGS) IMPLANT
BNDG GZE DERMACEA 4 6PLY (GAUZE/BANDAGES/DRESSINGS) ×2
CLOTH BEACON ORANGE TIMEOUT ST (SAFETY) ×3 IMPLANT
COVER LIGHT HANDLE STERIS (MISCELLANEOUS) ×6 IMPLANT
COVER PROBE U/S 5X48 (MISCELLANEOUS) IMPLANT
DECANTER SPIKE VIAL GLASS SM (MISCELLANEOUS) ×3 IMPLANT
DERMABOND ADVANCED .7 DNX12 (GAUZE/BANDAGES/DRESSINGS) ×3 IMPLANT
DRAPE C-ARM FOLDED MOBILE STRL (DRAPES) ×3 IMPLANT
ELECT REM PT RETURN 9FT ADLT (ELECTROSURGICAL) ×2
ELECTRODE REM PT RTRN 9FT ADLT (ELECTROSURGICAL) ×3 IMPLANT
EVACUATOR DRAINAGE 10X20 100CC (DRAIN) IMPLANT
EVACUATOR SILICONE 100CC (DRAIN) ×2
GLOVE BIO SURGEON STRL SZ 6.5 (GLOVE) ×3 IMPLANT
GLOVE BIO SURGEON STRL SZ7 (GLOVE) IMPLANT
GLOVE BIOGEL PI IND STRL 6.5 (GLOVE) ×3 IMPLANT
GLOVE BIOGEL PI IND STRL 7.0 (GLOVE) ×6 IMPLANT
GOWN STRL REUS W/TWL LRG LVL3 (GOWN DISPOSABLE) ×6 IMPLANT
IV NS 500ML (IV SOLUTION) ×2
IV NS 500ML BAXH (IV SOLUTION) ×3 IMPLANT
KIT PORT POWER 8FR ISP MRI (Port) ×3 IMPLANT
KIT TURNOVER KIT A (KITS) ×3 IMPLANT
MANIFOLD NEPTUNE II (INSTRUMENTS) ×3 IMPLANT
NDL HYPO 21X1.5 SAFETY (NEEDLE) IMPLANT
NDL HYPO 25X1 1.5 SAFETY (NEEDLE) ×3 IMPLANT
NEEDLE HYPO 21X1.5 SAFETY (NEEDLE) ×2 IMPLANT
NEEDLE HYPO 25X1 1.5 SAFETY (NEEDLE) ×2 IMPLANT
PACK MINOR (CUSTOM PROCEDURE TRAY) ×3 IMPLANT
PAD ABD 5X9 TENDERSORB (GAUZE/BANDAGES/DRESSINGS) IMPLANT
PAD ARMBOARD 7.5X6 YLW CONV (MISCELLANEOUS) ×3 IMPLANT
SET BASIN LINEN APH (SET/KITS/TRAYS/PACK) ×3 IMPLANT
SPONGE DRAIN TRACH 4X4 STRL 2S (GAUZE/BANDAGES/DRESSINGS) IMPLANT
SPONGE T-LAP 18X18 ~~LOC~~+RFID (SPONGE) IMPLANT
SUT ETHILON 3 0 FSL (SUTURE) IMPLANT
SUT MNCRL AB 4-0 PS2 18 (SUTURE) ×3 IMPLANT
SUT PROLENE 2 0 SH 30 (SUTURE) IMPLANT
SUT VIC AB 3-0 SH 27 (SUTURE) ×4
SUT VIC AB 3-0 SH 27X BRD (SUTURE) ×3 IMPLANT
SYR 10ML LL (SYRINGE) ×6 IMPLANT
SYR 5ML LL (SYRINGE) IMPLANT
SYR BULB IRRIG 60ML STRL (SYRINGE) IMPLANT
SYR CONTROL 10ML LL (SYRINGE) ×3 IMPLANT
TAPE CLOTH SURG 4X10 WHT LF (GAUZE/BANDAGES/DRESSINGS) IMPLANT
TOWEL OR 17X26 4PK STRL BLUE (TOWEL DISPOSABLE) IMPLANT

## 2022-10-05 NOTE — Op Note (Addendum)
Operative Note 10/05/22   Preoperative Diagnosis: Left breast cancer, left mastectomy site hematoma, concern for skin flap necrosis    Postoperative Diagnosis: Left breast cancer, left mastectomy site hematoma, viable skin   Procedure(s) Performed: Port-A-Cath placement, right subclavian; Evacuation of hematoma from left mastectomy site, JP drain placement   Surgeon: Leatrice Jewels. Henreitta Leber, MD   Assistants: No qualified resident was available   Anesthesia: General anesthesia    Anesthesiologist: Dr. Johnnette Litter, MD    Specimens: None   Estimated Blood Loss: Minimal   Fluoroscopy time: 3 min 9 seconds   Blood Replacement: None    Complications: None    Operative Findings: Initial port jumped into azygos vein and would not draw back, had to replace; Viable skin edges on the mastectomy site and old hematoma  (medial is the top of the photo)    Indications: Ms. Guel is a 47 yo with a history of left sided cancer s/p mastectomy and SLNB. She has developed a hematoma and concern for some skin flap necrosis. She needs a port placed for treatment and we discussed evacuation of the hematoma and possible debridement of the skin flaps. We discussed risk of bleeding, infection, pneumothorax, continued issues with drainage of hematoma.   Procedure: The patient was brought into the operating room and general anesthesia was induced.  One percent lidocaine was used for local anesthesia.   The right chest and neck was prepped and draped in the usual sterile fashion. The left mastectomy site was prepped with betadine and included in the draping in the sterile fashion.  Preoperative antibiotics were given.  I started with the port with the mastectomy site toweled off.   An incision was made below the right clavicle. A subcutaneous pocket was formed. The needles advanced into the right subclavian vein using the Seldinger technique without difficulty. A guidewire was then advanced into the right atrium under  fluoroscopic guidance.  Ectopia was noted and the wire pulled back. An introducer and peel-away sheath were placed over the guidewire. The catheter was then inserted through the peel-away sheath and the peel-away sheath was removed.  A spot film was performed to confirm the position. The catheter was then attached to the port and the port placed in subcutaneous pocket. The catheter seemed shorter than it had been previously and I worried it had gone into the azygos vein after looking at the positioning by fluoroscopy. I could not pull back or flush. I then cut the port from the catheter, rewired the catheter and started over with a new placement and kit. An introducer and peel-away sheath were placed over the guidewire. The catheter was then inserted through the peel-away sheath and the peel-away sheath was removed.  A spot film was performed to confirm the position. The catheter was then attached to the port and the port placed in subcutaneous pocket. The catheter then flushed and pulled back, and was confirmed to be in good position under fluorsoscopy.  Hemostasis was confirmed, and the port was secured with 2-0 prolene sutures.  The port was flushed with heparin flush. Subcutaneous layer was reapproximated using a 3-0 Vicryl interrupted suture. The skin was closed using a 4-0 Vicryl subcuticular suture. Dermabond was applied. This was toweled off and I turned my attention to the hematoma of the left mastectomy site.   The skin in the central portion of the mastectomy site felt thin and was either bruised or concern for impending necrosis. It looked much better today after I had aspirated  it on Tuesday.  I opened up the incision with a scalpel and evacuated over 200 of old liquidified hematoma. I irrigated over 1L of saline into the area. The subcutaneous tissue was thickened and edematous and very oozy from the edema.  There was no obvious source of bleeding just a generalized oozing from the edematous tissue.  The skin edges were actually about 0.5cm thick and bleeding and were viable. To be sure I did excise an ellipse at the central portion and sent this to pathology. This was no more than 0.5cm on each side and no more than 2cm long. The new skin edges bled easily and this was controlled with cautery. Given the generalized oozing, I placed JP Drain in the superior flap and secured it with a Nylon suture. I closed the flaps with 3-0 Vicryl interrupted and the skin with 3-0 Nylon interrupted. Betadine ointment was placed and a kerlix fluff with ABDs and paper tape were placed over the wound. There were no signs of infection in the wound.     All tape and needle counts were correct at the end of the procedure. The patient was transferred to PACU in stable condition. A chest x-ray will be performed at that time.  Algis Greenhouse, MD [facility] [address] [place], [place] [address] [phone] (office)

## 2022-10-05 NOTE — Anesthesia Procedure Notes (Incomplete Revision)
Procedure Name: Intubation Date/Time: 10/05/2022 11:09 AM  Performed by: Yona Kosek L, CRNAPre-anesthesia Checklist: Patient identified, Emergency Drugs available, Suction available and Patient being monitored Patient Re-evaluated:Patient Re-evaluated prior to induction Oxygen Delivery Method: Circle system utilized Preoxygenation: Pre-oxygenation with 100% oxygen Induction Type: IV induction Ventilation: Mask ventilation without difficulty Laryngoscope Size: Mac and 3 Grade View: Grade II Tube type: Oral Tube size: 7.0 mm Number of attempts: 1 Airway Equipment and Method: Stylet and Oral airway Placement Confirmation: ETT inserted through vocal cords under direct vision, positive ETCO2 and breath sounds checked- equal and bilateral Tube secured with: Tape Dental Injury: Teeth and Oropharynx as per pre-operative assessment       

## 2022-10-05 NOTE — Discharge Instructions (Addendum)
Leave the dressing in place for 72 hours unless it is saturated. JP drain empty and record the output and character.  Will hopefully be able to remove the drain next week. Will do a preventative Augmentin Rx to make sure we do not get any type of infection after having to replace a drain for the hematoma.   After you remove the dressing, do neosporin to the sutures and keep covered. The site does look a little concave (indented) this is from where the tissue around that spot is so swollen and thickened.  This will improve with time. I only removed a small piece of skin as the skin was actually healthy and bleeding.   You may shower just make sure you keep the drain and the mastectomy site clear from the water, pat those areas with a clean cloth and soap as needed.

## 2022-10-05 NOTE — Progress Notes (Addendum)
[facility] History and Physical   Chief Complaint   Follow-up     Diana Jensen is a 47 y.o. female.  HPI: [name] continues to have some drainage from the left mastectomy site after I aspirated it over 1 week ago. She reports occasional drainage. She has not had any fevers or chills. She needs to get a port a catheter placed to start chemotherapy. She came in today to discuss this and to have me look at the incision and area where it is draining. Past Medical History:  Diagnosis Date   Allergy    seasonal   Antiphospholipid syndrome    Anxiety    Arthritis    deterioration of spine, knees   Carpal tunnel syndrome of right wrist    Clotting disorder    antiphospholipid syndrome   Depression    Encounter for general adult medical examination with abnormal findings 07/03/2022   Essential hypertension, benign 02/10/2020   Hyperlipidemia 08/02/2022   Sleep disorder 02/22/2016    Past Surgical History:  Procedure Laterality Date   BREAST BIOPSY Left 08/10/2022   [place] LT BREAST BX W LOC DEV 1ST LESION IMG BX SPEC US GUIDE 08/10/2022 [facility]   BREAST BIOPSY Left 08/18/2022   MM LT BREAST BX W LOC DEV EA AD LESION IMG BX SPEC STEREO GUIDE 08/18/2022 [facility]   BREAST BIOPSY Left 08/18/2022   MM LT BREAST BX W LOC DEV 1ST LESION IMAGE BX SPEC STEREO GUIDE 08/18/2022 [facility]   CARPAL TUNNEL RELEASE Right 01/09/2019   Procedure: RIGHT CARPAL TUNNEL RELEASE;  Surgeon: Vickki Hearing, MD;  Location: [facility];  Service: Orthopedics;  Laterality: Right;   DILATION AND CURETTAGE OF UTERUS     x2   EYE SURGERY     lazy eye   PARTIAL HYSTERECTOMY  2011   SIMPLE MASTECTOMY WITH AXILLARY SENTINEL NODE BIOPSY Left 08/28/2022   Procedure: SIMPLE MASTECTOMY WITH AXILLARY SENTINEL NODE BIOPSY;  Surgeon: [name], MD;  Location: [facility];  Service: General;  Laterality: Left;    Family History  Problem Relation Age of Onset   Cancer Mother  22       breast    Arthritis Mother    Hypertension Mother    Osteoporosis Mother    Thyroid disease Father    Hypertension Father    Skin cancer Father    Mental illness Sister        depression ? bipolar   Dementia Maternal Grandmother    Cancer Maternal Grandfather        prostate   Heart disease Paternal Grandmother    Cancer Paternal Grandfather        colon   Colon cancer Paternal Grandfather    Mental illness Daughter    Pancreatic cancer Cousin        maternal cousin   Cancer Cousin        paternal, unknown type   Cancer Cousin        paternal, unknown type   Cancer - Lung Neg Hx    Cervical cancer Neg Hx     Social History   Tobacco Use   Smoking status: Every Day    Packs/day: 0.50    Years: 28.00    Additional pack years: 0.00    Total pack years: 14.00    Types: Cigarettes   Smokeless tobacco: Never   Tobacco comments:    She has been smoking since age 65, smokes half a  pack daily,was doing one pack a day until last year   Vaping Use   Vaping Use: Never used  Substance Use Topics   Alcohol use: No   Drug use: No    Medications: I have reviewed the patient's current medications. Allergies as of 10/03/2022   No Known Allergies      Medication List        Accurate as of October 03, 2022 11:59 PM. If you have any questions, ask your nurse or doctor.          acetaminophen 500 MG tablet Commonly known as: TYLENOL Take 1,000 mg by mouth every 6 (six) hours as needed for headache.   escitalopram 10 MG tablet Commonly known as: Lexapro Take 1 tablet (10 mg total) by mouth daily.   hydrOXYzine 50 MG capsule Commonly known as: VISTARIL Take 1 capsule (50 mg total) by mouth daily as needed for anxiety.   losartan 25 MG tablet Commonly known as: COZAAR Take 1 tablet (25 mg total) by mouth daily.   ondansetron 4 MG disintegrating tablet Commonly known as: ZOFRAN-ODT Take 1 tablet (4 mg total) by mouth every 6 (six) hours as needed for  nausea.   oxyCODONE 5 MG immediate release tablet Commonly known as: Oxy IR/ROXICODONE Take 1-2 tablets (5-10 mg total) by mouth every 4 (four) hours as needed for severe pain or breakthrough pain.         ROS:  A comprehensive review of systems was negative except for: Integument/breast: positive for hematoma drainage from mastectomy site   Blood pressure 130/74, pulse 78, temperature 98.2 F (36.8 C), temperature source Oral, resp. rate 14, height 5\' 7"  (1.702 m), weight 270 lb (122.5 kg), SpO2 94 %. Physical Exam Vitals reviewed.  HENT:     Head: Normocephalic.     Nose: Nose normal.  Eyes:     Pupils: Pupils are equal, round, and reactive to light.  Cardiovascular:     Rate and Rhythm: Normal rate.  Pulmonary:     Effort: Pulmonary effort is normal.  Chest:     Comments: Left mastectomy site with some skin flap necrosis starting at the incision extending about 1cm out in the medial aspect, there is a hematoma palpated but no obvious drainage today Abdominal:     General: There is no distension.     Palpations: Abdomen is soft.  Musculoskeletal:        General: No swelling.  Skin:    General: Skin is warm.  Neurological:     General: No focal deficit present.     Mental Status: She is alert.  Psychiatric:        Mood and Affect: Mood normal.        Behavior: Behavior normal.   Hematoma aspirated from left mastectomy site- 180cc of old blood removed  Results: None  Assessment & Plan:  Diana Jensen is a 47 y.o. female with left breast cancer and some skin flap necrosis that is likely has progressed due to the hematoma pressing on the flaps. I have had Dr. Lovell Sheehan come into look at he agrees. I think we need to take the  demarcated area and excise this and plan for revision of the medial part of the incision after this debridement and port placement on the left. This will get her to chemotherapy sooner.   Discussed risk of bleeding, infection, worsening issues with  the flaps, pneumothorax with CVL.   All questions were answered to the satisfaction of  the patient and family.    [name] 10/05/2022, 8:50 AM

## 2022-10-05 NOTE — Progress Notes (Signed)
[facility]  Rx sent for pain meds. Discussed with husband; will see next week and hopefully get drain out.  CXR ordered for port.  Algis Greenhouse, MD [facility] [address] [place], [place] [address] [phone] (office)

## 2022-10-05 NOTE — Anesthesia Preprocedure Evaluation (Signed)
Anesthesia Evaluation  Patient identified by MRN, date of birth, ID band Patient awake    Reviewed: Allergy & Precautions, H&P , NPO status , Patient's Chart, lab work & pertinent test results, reviewed documented beta blocker date and time   Airway Mallampati: II  TM Distance: >3 FB Neck ROM: full    Dental no notable dental hx.    Pulmonary neg pulmonary ROS, Current Smoker   Pulmonary exam normal breath sounds clear to auscultation       Cardiovascular Exercise Tolerance: Good hypertension, negative cardio ROS  Rhythm:regular Rate:Normal     Neuro/Psych  PSYCHIATRIC DISORDERS Anxiety Depression     Neuromuscular disease negative neurological ROS  negative psych ROS   GI/Hepatic negative GI ROS, Neg liver ROS,,,  Endo/Other    Morbid obesity  Renal/GU negative Renal ROS  negative genitourinary   Musculoskeletal   Abdominal   Peds  Hematology negative hematology ROS (+)   Anesthesia Other Findings   Reproductive/Obstetrics negative OB ROS                             Anesthesia Physical Anesthesia Plan  ASA: 3  Anesthesia Plan: General and General LMA   Post-op Pain Management:    Induction:   PONV Risk Score and Plan: Ondansetron  Airway Management Planned:   Additional Equipment:   Intra-op Plan:   Post-operative Plan:   Informed Consent: I have reviewed the patients History and Physical, chart, labs and discussed the procedure including the risks, benefits and alternatives for the proposed anesthesia with the patient or authorized representative who has indicated his/her understanding and acceptance.     Dental Advisory Given  Plan Discussed with: CRNA  Anesthesia Plan Comments:        Anesthesia Quick Evaluation

## 2022-10-05 NOTE — Interval H&P Note (Signed)
History and Physical Interval Note:  10/05/2022 10:51 AM  Diana Jensen  has presented today for surgery, with the diagnosis of LEFT BREAST CANCER ER POSITIVE SKIN FLAP NECROSIS.  The various methods of treatment have been discussed with the patient and family. After consideration of risks, benefits and other options for treatment, the patient has consented to  Procedure(s): INSERTION PORT-A-CATH (N/A) IRRIGATION AND DEBRIDEMENT HEMATOMA BREAST (Left) as a surgical intervention.  The patient's history has been reviewed, patient examined, no change in status, stable for surgery.  I have reviewed the patient's chart and labs.  Questions were answered to the patient's satisfaction.    Skin flaps looking better after I aspirated hematoma in clinic the other day. Still then medially, will plan to excise and evacuate here to help with healing. Port placement planned first.  Diana Jensen

## 2022-10-05 NOTE — Transfer of Care (Signed)
Immediate Anesthesia Transfer of Care Note  Patient: Diana Jensen  Procedure(s) Performed: INSERTION PORT-A-CATH (Right: Chest) HEMATOMA EVACUATION OF LEFT BREAST WITH JP DRAIN PLACEMENT (Left: Breast)  Patient Location: PACU  Anesthesia Type:General  Level of Consciousness: drowsy, patient cooperative, and responds to stimulation  Airway & Oxygen Therapy: Patient Spontanous Breathing and Patient connected to nasal cannula oxygen  Post-op Assessment: Report given to RN, Post -op Vital signs reviewed and stable, and Patient moving all extremities X 4  Post vital signs: Reviewed and stable  Last Vitals:  Vitals Value Taken Time  BP 110/83 10/05/22 1241  Temp    Pulse 94 10/05/22 1243  Resp 19 10/05/22 1243  SpO2 97 % 10/05/22 1243  Vitals shown include unvalidated device data.  Last Pain:  Vitals:   10/05/22 0936  TempSrc: Oral  PainSc: 0-No pain      Patients Stated Pain Goal: 7 (10/05/22 0936)  Complications: No notable events documented.

## 2022-10-05 NOTE — H&P (Signed)
[facility] History and Physical   Chief Complaint   Follow-up     Diana Jensen is a 47 y.o. female.  HPI: Diana Jensen continues to have some drainage from the left mastectomy site after I aspirated it over 1 week ago. She reports occasional drainage. She has not had any fevers or chills. She needs to get a port a catheter placed to start chemotherapy. She came in today to discuss this and to have me look at the incision and area where it is draining. Past Medical History:  Diagnosis Date   Allergy    seasonal   Antiphospholipid syndrome    Anxiety    Arthritis    deterioration of spine, knees   Carpal tunnel syndrome of right wrist    Clotting disorder    antiphospholipid syndrome   Depression    Encounter for general adult medical examination with abnormal findings 07/03/2022   Essential hypertension, benign 02/10/2020   Hyperlipidemia 08/02/2022   Sleep disorder 02/22/2016    Past Surgical History:  Procedure Laterality Date   BREAST BIOPSY Left 08/10/2022   [place] LT BREAST BX W LOC DEV 1ST LESION IMG BX SPEC US GUIDE 08/10/2022 [facility]   BREAST BIOPSY Left 08/18/2022   MM LT BREAST BX W LOC DEV EA AD LESION IMG BX SPEC STEREO GUIDE 08/18/2022 [facility]   BREAST BIOPSY Left 08/18/2022   MM LT BREAST BX W LOC DEV 1ST LESION IMAGE BX SPEC STEREO GUIDE 08/18/2022 [facility]   CARPAL TUNNEL RELEASE Right 01/09/2019   Procedure: RIGHT CARPAL TUNNEL RELEASE;  Surgeon: Vickki Hearing, MD;  Location: [facility];  Service: Orthopedics;  Laterality: Right;   DILATION AND CURETTAGE OF UTERUS     x2   EYE SURGERY     lazy eye   PARTIAL HYSTERECTOMY  2011   SIMPLE MASTECTOMY WITH AXILLARY SENTINEL NODE BIOPSY Left 08/28/2022   Procedure: SIMPLE MASTECTOMY WITH AXILLARY SENTINEL NODE BIOPSY;  Surgeon: Diana Roers, MD;  Location: [facility];  Service: General;  Laterality: Left;    Family History  Problem Relation Age of Onset   Cancer Mother  22       breast    Arthritis Mother    Hypertension Mother    Osteoporosis Mother    Thyroid disease Father    Hypertension Father    Skin cancer Father    Mental illness Sister        depression ? bipolar   Dementia Maternal Grandmother    Cancer Maternal Grandfather        prostate   Heart disease Paternal Grandmother    Cancer Paternal Grandfather        colon   Colon cancer Paternal Grandfather    Mental illness Daughter    Pancreatic cancer Cousin        maternal cousin   Cancer Cousin        paternal, unknown type   Cancer Cousin        paternal, unknown type   Cancer - Lung Neg Hx    Cervical cancer Neg Hx     Social History   Tobacco Use   Smoking status: Every Day    Packs/day: 0.50    Years: 28.00    Additional pack years: 0.00    Total pack years: 14.00    Types: Cigarettes   Smokeless tobacco: Never   Tobacco comments:    She has been smoking since age 65, smokes half a  pack daily,was doing one pack a day until last year   Vaping Use   Vaping Use: Never used  Substance Use Topics   Alcohol use: No   Drug use: No    Medications: I have reviewed the patient's current medications. Allergies as of 10/03/2022   No Known Allergies      Medication List        Accurate as of October 03, 2022 11:59 PM. If you have any questions, ask your nurse or doctor.          acetaminophen 500 MG tablet Commonly known as: TYLENOL Take 1,000 mg by mouth every 6 (six) hours as needed for headache.   escitalopram 10 MG tablet Commonly known as: Lexapro Take 1 tablet (10 mg total) by mouth daily.   hydrOXYzine 50 MG capsule Commonly known as: VISTARIL Take 1 capsule (50 mg total) by mouth daily as needed for anxiety.   losartan 25 MG tablet Commonly known as: COZAAR Take 1 tablet (25 mg total) by mouth daily.   ondansetron 4 MG disintegrating tablet Commonly known as: ZOFRAN-ODT Take 1 tablet (4 mg total) by mouth every 6 (six) hours as needed for  nausea.   oxyCODONE 5 MG immediate release tablet Commonly known as: Oxy IR/ROXICODONE Take 1-2 tablets (5-10 mg total) by mouth every 4 (four) hours as needed for severe pain or breakthrough pain.         ROS:  A comprehensive review of systems was negative except for: Integument/breast: positive for hematoma drainage from mastectomy site   Blood pressure 130/74, pulse 78, temperature 98.2 F (36.8 C), temperature source Oral, resp. rate 14, height 5\' 7"  (1.702 m), weight 270 lb (122.5 kg), SpO2 94 %. Physical Exam Vitals reviewed.  HENT:     Head: Normocephalic.     Nose: Nose normal.  Eyes:     Pupils: Pupils are equal, round, and reactive to light.  Cardiovascular:     Rate and Rhythm: Normal rate.  Pulmonary:     Effort: Pulmonary effort is normal.  Chest:     Comments: Left mastectomy site with some skin flap necrosis starting at the incision extending about 1cm out in the medial aspect, there is a hematoma palpated but no obvious drainage today Abdominal:     General: There is no distension.     Palpations: Abdomen is soft.  Musculoskeletal:        General: No swelling.  Skin:    General: Skin is warm.  Neurological:     General: No focal deficit present.     Mental Status: She is alert.  Psychiatric:        Mood and Affect: Mood normal.        Behavior: Behavior normal.     Results: None  Assessment & Plan:  Diana Jensen is a 47 y.o. female with left breast cancer and some skin flap necrosis that is likely has progressed due to the hematoma pressing on the flaps. I have had Dr. Lovell SheehanJenkins come into look at he agrees. I think we need to take the  demarcated area and excise this and plan for revision of the medial part of the incision after this debridement and port placement on the left. This will get her to chemotherapy sooner.   Discussed risk of bleeding, infection, worsening issues with the flaps, pneumothorax with CVL.   All questions were answered to  the satisfaction of the patient and family.    Diana Jensen  10/05/2022, 8:50 AM

## 2022-10-05 NOTE — Anesthesia Procedure Notes (Cosign Needed)
Procedure Name: Intubation Date/Time: 10/05/2022 11:09 AM  Performed by: Lorin Glass, CRNAPre-anesthesia Checklist: Patient identified, Emergency Drugs available, Suction available and Patient being monitored Patient Re-evaluated:Patient Re-evaluated prior to induction Oxygen Delivery Method: Circle system utilized Preoxygenation: Pre-oxygenation with 100% oxygen Induction Type: IV induction Ventilation: Mask ventilation without difficulty Laryngoscope Size: Mac and 3 Grade View: Grade II Tube type: Oral Tube size: 7.0 mm Number of attempts: 1 Airway Equipment and Method: Stylet and Oral airway Placement Confirmation: ETT inserted through vocal cords under direct vision, positive ETCO2 and breath sounds checked- equal and bilateral Tube secured with: Tape Dental Injury: Teeth and Oropharynx as per pre-operative assessment

## 2022-10-06 ENCOUNTER — Other Ambulatory Visit: Payer: Self-pay

## 2022-10-06 LAB — SURGICAL PATHOLOGY

## 2022-10-06 NOTE — Anesthesia Postprocedure Evaluation (Signed)
Anesthesia Post Note  Patient: Diana Jensen  Procedure(s) Performed: INSERTION PORT-A-CATH (Right: Chest) HEMATOMA EVACUATION OF LEFT BREAST WITH JP DRAIN PLACEMENT (Left: Breast)  Patient location during evaluation: Phase II Anesthesia Type: General Level of consciousness: awake Pain management: pain level controlled Vital Signs Assessment: post-procedure vital signs reviewed and stable Respiratory status: spontaneous breathing and respiratory function stable Cardiovascular status: blood pressure returned to baseline and stable Postop Assessment: no headache and no apparent nausea or vomiting Anesthetic complications: no Comments: Late entry   No notable events documented.   Last Vitals:  Vitals:   10/05/22 1333 10/05/22 1351  BP: (!) 158/90 124/82  Pulse: 77 82  Resp: 16 16  Temp:  36.8 C  SpO2: 94% 93%    Last Pain:  Vitals:   10/06/22 1350  TempSrc:   PainSc: 0-No pain                 Windell Norfolk

## 2022-10-11 ENCOUNTER — Ambulatory Visit (INDEPENDENT_AMBULATORY_CARE_PROVIDER_SITE_OTHER): Payer: Managed Care, Other (non HMO) | Admitting: General Surgery

## 2022-10-11 ENCOUNTER — Encounter: Payer: Self-pay | Admitting: General Surgery

## 2022-10-11 ENCOUNTER — Other Ambulatory Visit: Payer: Self-pay

## 2022-10-11 VITALS — BP 127/85 | HR 102 | Temp 98.9°F | Resp 16 | Ht 67.0 in | Wt 267.0 lb

## 2022-10-11 DIAGNOSIS — F419 Anxiety disorder, unspecified: Secondary | ICD-10-CM

## 2022-10-11 DIAGNOSIS — T148XXA Other injury of unspecified body region, initial encounter: Secondary | ICD-10-CM

## 2022-10-11 MED ORDER — ESCITALOPRAM OXALATE 10 MG PO TABS
10.0000 mg | ORAL_TABLET | Freq: Every day | ORAL | 0 refills | Status: DC
Start: 2022-10-11 — End: 2023-01-29

## 2022-10-11 NOTE — Progress Notes (Unsigned)
[facility]  Minimal out from JP. Dark old blood and 20cc in the last two days. No drainage from wound reported or redness.  BP 127/85   Pulse (!) 102   Temp 98.9 F (37.2 C) (Oral)   Resp 16   Ht 5\' 7"  (1.702 m)   Wt 267 lb (121.1 kg)   SpO2 95%   BMI 41.82 kg/m  Sutures c/d/I and some indention from the suciton of the drain and the edema of the tissue JP drain removed, dressing placed   Pathology from skin benign and reassuring  Patient s/p debridement/ hematoma evacuation for left mastectomy site. Doing well. JP removed.   Keep are dry and covered for 72 hours until closes up.  Call with issues. Replace gauze if draining.   Future Appointments  Date Time Provider Department Center  10/12/2022  2:00 PM AP-[facility]A CHEMO CLASS [facility] None  10/17/2022  9:00 AM [name], MD [facility] None  10/19/2022  9:00 AM AP-PET CT 1 AP-NM [place] [facility]  10/30/2022  1:20 PM [name], MD [facility]  11/10/2022 10:30 AM AP - ECHO 1 OUTPATIENT AP-CARDIOPUL [place] [facility]  01/04/2023 11:15 AM Lacy Duverney T [facility] None  01/04/2023 12:15 PM AP-AC[facility] LAB [facility] None   Algis Greenhouse, MD [facility] [address] [place] [place], [place] [address] [phone] (office)

## 2022-10-11 NOTE — Patient Instructions (Signed)
Keep are dry and covered for 72 hours until closes up.  Call with issues. Replace gauze if draining.

## 2022-10-12 ENCOUNTER — Inpatient Hospital Stay: Payer: Managed Care, Other (non HMO)

## 2022-10-12 ENCOUNTER — Ambulatory Visit (HOSPITAL_COMMUNITY): Admission: RE | Admit: 2022-10-12 | Payer: Managed Care, Other (non HMO) | Source: Ambulatory Visit

## 2022-10-12 ENCOUNTER — Encounter (HOSPITAL_COMMUNITY): Payer: Self-pay | Admitting: General Surgery

## 2022-10-12 ENCOUNTER — Other Ambulatory Visit (HOSPITAL_COMMUNITY): Payer: Managed Care, Other (non HMO)

## 2022-10-16 ENCOUNTER — Other Ambulatory Visit: Payer: Managed Care, Other (non HMO)

## 2022-10-16 ENCOUNTER — Ambulatory Visit: Payer: Managed Care, Other (non HMO) | Admitting: Hematology

## 2022-10-17 ENCOUNTER — Encounter: Payer: Self-pay | Admitting: General Surgery

## 2022-10-17 ENCOUNTER — Ambulatory Visit (INDEPENDENT_AMBULATORY_CARE_PROVIDER_SITE_OTHER): Payer: Managed Care, Other (non HMO) | Admitting: General Surgery

## 2022-10-17 ENCOUNTER — Ambulatory Visit: Payer: Managed Care, Other (non HMO)

## 2022-10-17 VITALS — BP 129/84 | HR 89 | Temp 98.1°F | Resp 14 | Ht 67.0 in | Wt 268.0 lb

## 2022-10-17 DIAGNOSIS — C50112 Malignant neoplasm of central portion of left female breast: Secondary | ICD-10-CM

## 2022-10-17 DIAGNOSIS — Z170 Estrogen receptor positive status [ER+]: Secondary | ICD-10-CM

## 2022-10-17 DIAGNOSIS — T148XXA Other injury of unspecified body region, initial encounter: Secondary | ICD-10-CM

## 2022-10-17 MED ORDER — LIDOCAINE-PRILOCAINE 2.5-2.5 % EX CREA
TOPICAL_CREAM | CUTANEOUS | 3 refills | Status: DC
Start: 2022-10-17 — End: 2023-07-31

## 2022-10-17 MED ORDER — PROCHLORPERAZINE MALEATE 10 MG PO TABS
10.0000 mg | ORAL_TABLET | Freq: Four times a day (QID) | ORAL | 1 refills | Status: DC | PRN
Start: 2022-10-17 — End: 2022-12-18

## 2022-10-17 NOTE — Progress Notes (Unsigned)
[facility]  Incision looking good. No drainage.  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 Wound healing well now.   Steristrips will peel off in the next 5-7 days. You can remove them once they are peeling off. It is ok to shower. Pat the area dry.   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   Call with any issues.   Let Dr. Kirtland Bouchard  team know that you can start chemotherapy.   Future Appointments  Date Time Provider Department Center  10/18/2022  2:00 PM AP-[facility] CHEMO CLASS [facility] None  10/19/2022  9:00 AM AP-PET CT 1 AP-NM [place] [facility]  10/25/2022  8:45 AM AP-[facility] NURSE [facility] None  10/25/2022  9:45 AM [name], MD [facility] None  10/25/2022 10:00 AM AP-[facility] CHAIR 1 [facility] None  10/27/2022  9:30 AM AP-[facility] NURSE [facility] None  10/30/2022  1:20 PM [name], MD [facility]  11/07/2022  9:45 AM [name], MD RS-RS None  11/07/2022  1:15 PM AP-ACAPA NURSE [facility] None  11/07/2022  2:00 PM [name], MD [facility] None  11/09/2022 10:30 AM AP-[facility] CHAIR 1 [facility] None  11/10/2022 10:30 AM AP - ECHO 1 OUTPATIENT AP-CARDIOPUL  H  01/04/2023 11:15 AM Lacy Duverney T [facility] None  01/04/2023 12:15 PM AP-AC[facility]A LAB [facility] None    Algis Greenhouse, MD [facility] [address] [place] [place], [place] [address] [phone] (office)

## 2022-10-17 NOTE — Patient Instructions (Addendum)
Steristrips will peel off in the next 5-7 days. You can remove them once they are peeling off. It is ok to shower. Pat the area dry.   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   Call with any issues.

## 2022-10-17 NOTE — Patient Instructions (Addendum)
[facility] Chemotherapy Teaching  You were seen and examined by Dr. Ellin Saba on you first visit with the Cancer Center. He will be treating you  for your recent diagnosis of breast cancer. This cancer is considered a Stage II disease.    He discussed with you starting treatment on a combination of chemotherapy drugs. Those drugs are Adriamycin, Cytoxan, and Taxol. Treatment is in two phases. The first phase you will come to the clinic every 2 weeks for a total of 4 times to receive Adriamycin and Cytoxan. After 4 of these treatments, you will then come weekly to the clinic to receive the Taxol. You will do this for a total of 12 weeks. The intent of treatment is to prevent the cancer from coming back.  The intent of treatment is curative.    You will see the doctor regularly throughout treatment.  We will obtain blood work from you prior to every treatment and monitor your results to make sure it is safe to give your treatment. The doctor monitors your response to treatment by the way you are feeling, your blood work, and by obtaining scans periodically.  There will be wait times while you are here for treatment.  It will take about 30 minutes to 1 hour for your lab work to result.  Then there will be wait times while pharmacy mixes your medications.     Medications you will receive in the clinic prior to your chemotherapy medications:  Aloxi:  ALOXI is used in adults to help prevent nausea and vomiting that happens with certain chemotherapy drugs.  Aloxi is a long acting medication, and will remain in your system for about two days.   Emend:  This is an anti-nausea medication that is used with Aloxi to help prevent nausea and vomiting caused by chemotherapy.  Dexamethasone:  This is a steroid given prior to chemotherapy to help prevent allergic reactions; it may also help prevent and control nausea and diarrhea.          Udenyca (Neulasta) - this medication is not chemo but  being given because you have had chemo. It is usually given 24-72 hours after the completion of chemotherapy. This medication works by boosting your bone marrow's supply of white blood cells. White blood cells are what protect our bodies against infection. The medication is given in the form of a subcutaneous injection. It is given in the fatty tissue of your abdomen or in the skin in the back of your arm. It is a short needle. The major side effect of this medication is bone or muscle pain. The drug of choice to relieve or lessen the pain is Aleve or Ibuprofen. If a physician has ever told you not to take Aleve or Ibuprofen - then don't take it. You should then take Tylenol/acetaminophen. Take either medication as the bottle directs you to.  The level of pain you experience as a result of this injection can range from none, to mild or moderate, or severe. Please let us know if you develop moderate or severe bone pain.   You can take Claritin 10 mg over the counter for a few days after receiving neulasta to help with the bone aches and pains.    Cyclophosphamide (Generic Name) Other Names: Cytoxan, Neosar   About This Drug Cyclophosphamide is a drug used to treat cancer. It is given in the vein (IV) or by mouth.  Takes 30 minutes for this drug to infuse.   Possible  Side Effects (More Common)  Nausea and throwing up (vomiting). These symptoms may happen within a few hours after your treatment and may last up to 72 hours. Medicines are available to stop or lessen these side effects.  Bone marrow depression. This is a decrease in the number of white blood cells, red blood cells, and platelets. This may raise your risk of infection, make you tired and weak (fatigue), and raise your risk of bleeding.  Hair loss: You may notice hair getting thin. Some patients lose their hair. Hair loss is often complete scalp hair loss and can involve loss of eyebrows, eyelashes, and pubic hair. You may notice this a few  days or weeks after treatment has started. Most often hair loss is temporary; your hair should grow back when treatment is done.  Decreased appetite (decreased hunger)  Blurred vision  Soreness of the mouth and throat. You may have red areas, white patches, or sores that hurt.  Effects on the bladder. This drug may cause irritation and bleeding in the bladder. You may have blood in your urine. To help stop this, you will get extra fluids to help you pass more urine. You may get a drug called mesna, which helps to decrease irritation and bleeding. You may also get a medicine to help you pass more urine. You may have a catheter (tube) placed in your bladder so that your bladder will be washed with this drug.   Possible Side Effects (Less Common)  Darkening of the skin or nails  Metallic taste in the mouth  Changes in lung tissue may happen with large amounts of this drug. These changes may not last forever, and your lung tissue may go back to normal. Sometimes these changes may not be seen for many years. You may get a cough or have trouble catching your breath.   Allergic Reactions   Serious allergic reactions including anaphylaxis are rare. While you are getting this drug in your vein (IV), tell your nurse right away if you have any of these symptoms of an allergic reaction:  Trouble catching your breath  Feeling like your tongue or throat are swelling  Feeling your heart beat quickly or in a not normal way (palpitations)  Feeling dizzy or lightheaded  Flushing, itching, rash, and/or hives   Treating Side Effects  Drink 6-8 cups of fluids each day unless your doctor has told you to limit your fluid intake due to some other health problem. A cup is 8 ounces of fluid. If you throw up or have loose bowel movements you should drink more fluids so that you do not become dehydrated (lack water in the body due to losing too much fluid).  Ask your doctor or nurse about medicine that is available to help  stop or lessen nausea or throwing up.  Mouth care is very important. Your mouth care should consist of routine, gentle cleaning of your teeth or dentures and rinsing your mouth with a mixture of 1/2 teaspoon of salt in 8 ounces of water or  teaspoon of baking soda in 8 ounces of water. This should be done at least after each meal and at bedtime.  If you have mouth sores, avoid mouthwash that has alcohol. Also avoid alcohol and smoking because they can bother your mouth and throat.  Talk with your nurse about getting a wig before you lose your hair. Also, call the [employer] at 800-ACS-2345 to find out information about the " Look Good.Marland KitchenMarland KitchenFeel Better" program  close to where you live. It is a free program where women undergoing chemotherapy learn about wigs, turbans and scarves as well as makeup techniques and skin and nail care.   Important Information  Whenever you tell a doctor or nurse your health history, always tell them that you have received cyclophosphamide in the past.  If you take this drug by mouth swallow the medicine whole. Do not chew, break or crush it.  You can take the medicine with or without food. If you have nausea, take it with food. Do not take the pills at bedtime.   Food and Drug Interactions There are no known interactions of cyclophosphamide with food. This drug may interact with other medicines. Tell your doctor and pharmacist about all the medicines and dietary supplements (vitamins, minerals, herbs and others) that you are taking at this time. The safety and use of dietary supplements and alternative diets are often not known. Using these might affect your cancer or interfere with your treatment. Until more is known, you should not use dietary supplements or alternative diets without your cancer doctor's help.   When to Call the Doctor   Call your doctor or nurse right away if you have any of these symptoms:    Fever of 100.5 F (38 C) or higher  Chills   Bleeding or bruising that is not normal  Blurred vision or other changes in eyesight  Pain when passing urine; blood in urine  Pain in your lower back or side  Wheezing or trouble breathing  Swelling of legs, ankles, or feet  Feeling dizzy or lightheaded  Feeling confused or agitated  Signs of liver problems: dark urine, pale bowel movements, bad stomach pain, feeling very tired and weak, unusual itching, or yellowing of the eyes or skin  Unusual thirst or passing urine often  Nausea that stops you from eating or drinking  Throwing up more than 3 times a day  Call your doctor or nurse as soon as possible if any of these symptoms happen:  Pain in your mouth or throat that makes it hard to eat or drink  Nausea not relieved by prescribed medicines   Sexual Problems and Reproductive Concerns    Infertility warning: Sexual problems and reproduction concerns may happen. In both men and women, this drug may affect your ability to have children. This cannot be determined before your treatment. Talk with your doctor or nurse if you plan to have children. Ask for information on sperm or egg banking.  In men, this drug may interfere with your ability to make sperm, but it should not change your ability to have sexual relations.  In women, menstrual bleeding may become irregular or stop while you are getting this drug. Do not assume that you cannot become pregnant if you do not have a menstrual period.  Women may go through signs of menopause (change of life) like vaginal dryness or itching. Vaginal lubricants can be used to lessen vaginal dryness, itching, and pain during sexual relations.  Genetic counseling is available for you to talk about the effects of this drug therapy on future pregnancies. Also, a genetic counselor can look at the possible risk of problems in the unborn baby due to this medicine if an exposure happens during pregnancy.  Pregnancy warning: This drug may have harmful effects on  the unborn child, so effective methods of birth control should be used during your cancer treatment.  Breast feeding warning: Women should not breast feed during treatment  because this drug could enter the breast milk and badly harm a breast feeding baby    Doxorubicin (Generic Name) Other Names: Adriamycin, hydroxyl daunorubicin   About This Drug Doxorubicin is a drug used to treat cancer. This drug is given in the vein (IV).  This drug is an IV push over about 10 minutes.     Possible Side Effects (More Common)  Bone marrow depression. This is a decrease in the number of white blood cells, red blood cells, and platelets. This may raise your risk of infection, make you tired and weak (fatigue), and raise your risk of bleeding.  Hair loss: Hair loss is often complete scalp hair loss and can involve loss of eyebrows, eyelashes, and pubic hair. You may notice this a few days or weeks after treatment has started. Most often hair loss is temporary; your hair should grow back when treatment is done.  Nausea and throwing up (vomiting). These symptoms may happen within a few hours after your treatment and may last up to 24 hours. Medicines are available to stop or lessen these side effects.  Soreness of the mouth and throat. You may have red areas, white patches, or sores that hurt.  Change in the color of your urine to pink or red. This color change will go away in one to two days.  Effects on the heart: This drug can weaken the heart and lower heart function. Your heart function will be checked as needed. You may have trouble catching your breath, mainly during activities. You may also have trouble breathing while lying down, and have swelling in your ankles.  Sensitivity to light (photosensitivity). Photosensitivity means that you may become more sensitive to the effects of the sun, sun lamps, and tanning beds. Your eyes may water more, mostly in bright light.  Metallic taste in the mouth: This may  change the taste of food and drinks  Decreased appetite (decreased hunger)  Darkening of the skin or nails  Weakness that interferes with your daily activities   Possible Side Effects (Less Common)  Skin and tissue irritation may involve redness, pain, warmth, or swelling at the IV site. This happens if the drug leaks out of the vein and into nearby tissue.  Changes in your liver function. Your doctor will check your liver function as needed.  This drug may cause an increased risk of developing a second cancer   Allergic Reaction Serious allergic reactions, including anaphylaxis are rare. While you are getting this drug in your vein (IV), tell your nurse right away if you have any of these symptoms of an allergic reaction:  Trouble catching your breath  Feeling like your tongue or throat are swelling  Feeling your heart beat quickly or in a not normal way (palpitations)  Feeling dizzy or lightheaded  Flushing, itching, rash, and/or hives   Treating Side Effects  Drink 6-8 cups of fluids every day unless your doctor has told you to limit your fluid intake due to some other health problem. A cup is 8 ounces of fluid. If you vomit or have diarrhea, you should drink more fluids so that you do not become dehydrated (lack water in the body due to losing too much fluid).  Ask your doctor or nurse about medicine that is available to help stop or lessen nausea, throwing up, and/or loose bowel movements  Wear dark sunglasses and use sunscreen with SPF 30 or higher when you are outdoors even for a short time. Cover up when  you are out in the sun. Wear wide-brimmed hats, long-sleeved shirts, and pants. Keep your neck, chest, and back covered.  Mouth care is very important. Your mouth care should consist of routine, gentle cleaning of your teeth or dentures and rinsing your mouth with a mixture of 1/2 teaspoon of salt in 8 ounces of water or  teaspoon of baking soda in 8 ounces of water. This should be  done at least after each meal and at bedtime.  If you have mouth sores, avoid mouthwash that has alcohol. Avoid alcohol and smoking because they can bother your mouth and throat.  Talk with your nurse about getting a wig before you lose your hair. Also, call the [employer] at 800-ACS-2345 to find out information about the "Look Good, Feel Better" program close to where you live. It is a free program where women getting chemotherapy can learn about wigs, turbans and scarves as well as makeup techniques and skin and nail care.  While you are getting this drug, please tell your nurse right away if you have any pain, redness, or swelling at the site of the IV infusion.   Food and Drug Interactions There are no known interactions of doxorubicin with food. This drug may interact with other medicines. Tell your doctor and pharmacist about all the medicines and dietary supplements (vitamins, minerals, herbs and others) that you are taking at this time. The safety and use of dietary supplements and alternative diets are often not known. Using these might affect your cancer or interfere with your treatment. Until more is known, you should not use dietary supplements or alternative diets without your cancer doctor's help.   When to Call the Doctor Call your doctor or nurse right away if you have any of these symptoms:  Fever of 100.5 F (38 C) or above  Chills  Easy bruising or bleeding  Wheezing or trouble breathing  Rash or itching  Feeling dizzy or lightheaded  Feeling that your heart is beating in a fast or not normal way (palpitations)  Loose bowel movements (diarrhea) more than 4 times a day or diarrhea with weakness or feeling lightheaded  Nausea that stops you from eating or drinking  Throwing up more than 3 times a day  Signs of liver problems: dark urine, pale bowel movements, bad stomach pain, feeling very tired and weak, unusual itching, or yellowing of the eyes or skin,  During the  IV infusion, if you have pain, redness, or swelling at the site of the IV infusion, please tell your nurse right away   Call your doctor or nurse as soon as possible if any of these symptoms happen:  Decreased urine  Pain in your mouth or throat that makes it hard to eat or drink  Nausea and throwing up that is not relieved by prescribed medicines  Rash that is not relieved by prescribed medicines  Swelling of legs, ankles, or feet  Weight gain of 5 pounds in one week (fluid retention)  Lasting loss of appetite or rapid weight loss of five pounds in a week  Fatigue that interferes with your daily activities  Extreme weakness that interferes with normal activities   Sexual Problems and Reproduction Concerns  Infertility warning: Sexual problems and reproduction concerns may happen. In both men and women, this drug may affect your ability to have children. This cannot be determined before your treatment. Talk with your doctor or nurse if you plan to have children. Ask for information on sperm  or egg banking.  In men, this drug may interfere with your ability to make sperm, but it should not change your ability to have sexual relations.  In women, menstrual bleeding may become irregular or stop while you are getting this drug. Do not assume that you cannot become pregnant if you do not have a menstrual period.      Paclitaxel (Taxol)   About This Drug Paclitaxel is a drug used to treat cancer. It is given in the vein (IV).  This will take 1 hour to infuse.  The first infusion will take longer to infuse because it is increased slowly to monitor for reactions.  The nurse will be in the room with you for the first 15 minutes of the first infusion.   Possible Side Effects  Hair loss. Hair loss is often temporary, although with certain medicine, hair loss can sometimes be permanent. Hair loss may happen suddenly or gradually. If you lose hair, you may lose it from your head, face, armpits, pubic  area, chest, and/or legs. You may also notice your hair getting thin.  Swelling of your legs, ankles and/or feet (edema)  Flushing  Nausea and throwing up (vomiting)  Loose bowel movements (diarrhea)  Bone marrow depression. This is a decrease in the number of white blood cells, red blood cells, and platelets. This may raise your risk of infection, make you tired and weak (fatigue), and raise your risk of bleeding.  Effects on the nerves are called peripheral neuropathy. You may feel numbness, tingling, or pain in your hands and feet. It may be hard for you to button your clothes, open jars, or walk as usual. The effect on the nerves may get worse with more doses of the drug. These effects get better in some people after the drug is stopped but it does not get better in all people.  Changes in your liver function  Bone, joint and muscle pain  Abnormal EKG  Allergic reaction: Allergic reactions, including anaphylaxis are rare but may happen in some patients. Signs of allergic reaction to this drug may be swelling of the face, feeling like your tongue or throat are swelling, trouble breathing, rash, itching, fever, chills, feeling dizzy, and/or feeling that your heart is beating in a fast or not normal way. If this happens, do not take another dose of this drug. You should get urgent medical treatment.  Infection  Changes in your kidney function. Note: Each of the side effects above was reported in 20% or greater of patients treated with paclitaxel. Not all possible side effects are included above.   Warnings and Precautions  Severe allergic reactions  Severe bone marrow depression   Treating Side Effects  To help with hair loss, wash with a mild shampoo and avoid washing your hair every day.  Avoid rubbing your scalp, instead, pat your hair or scalp dry  Avoid coloring your hair  Limit your use of hair spray, electric curlers, blow dryers, and curling irons.  If you are interested in getting a  wig, talk to your nurse. You can also call the [employer] at 800-ACS-2345 to find out information about the "Look Good, Feel Better" program close to where you live. It is a free program where women getting chemotherapy can learn about wigs, turbans and scarves as well as makeup techniques and skin and nail care.  Ask your doctor or nurse about medicines that are available to help stop or lessen diarrhea and/or nausea.  To  help with nausea and vomiting, eat small, frequent meals instead of three large meals a day. Choose foods and drinks that are at room temperature. Ask your nurse or doctor about other helpful tips and medicine that is available to help or stop lessen these symptoms.  If you get diarrhea, eat low-fiber foods that are high in protein and calories and avoid foods that can irritate your digestive tracts or lead to cramping. Ask your nurse or doctor about medicine that can lessen or stop your diarrhea.  Mouth care is very important. Your mouth care should consist of routine, gentle cleaning of your teeth or dentures and rinsing your mouth with a mixture of 1/2 teaspoon of salt in 8 ounces of water or  teaspoon of baking soda in 8 ounces of water. This should be done at least after each meal and at bedtime.  If you have mouth sores, avoid mouthwash that has alcohol. Also avoid alcohol and smoking because they can bother your mouth and throat.  Drink plenty of fluids (a minimum of eight glasses per day is recommended).  Take your temperature as your doctor or nurse tells you, and whenever you feel like you may have a fever.  Talk to your doctor or nurse about precautions you can take to avoid infections and bleeding.  Be careful when cooking, walking, and handling sharp objects and hot liquids.   Food and Drug Interactions  There are no known interactions of paclitaxel with food.  This drug may interact with other medicines. Tell your doctor and pharmacist about all the  medicines and dietary supplements (vitamins, minerals, herbs and others) that you are taking at this time.  The safety and use of dietary supplements and alternative diets are often not known. Using these might affect your cancer or interfere with your treatment. Until more is known, you should not use dietary supplements or alternative diets without your cancer doctor's help.   When to Call the Doctor Call your doctor or nurse if you have any of the following symptoms and/or any new or unusual symptoms:  Fever of 100.5 F (38 C) or above  Chills  Redness, pain, warmth, or swelling at the IV site during the infusion  Signs of allergic reaction: swelling of the face, feeling like your tongue or throat are swelling, trouble breathing, rash, itching, fever, chills, feeling dizzy, and/or feeling that your heart is beating in a fast or not normal way  Feeling that your heart is beating in a fast or not normal way (palpitations)  Weight gain of 5 pounds in one week (fluid retention)  Decreased urine or very dark urine  Signs of liver problems: dark urine, pale bowel movements, bad stomach pain, feeling very tired and weak, unusual itching, or yellowing of the eyes or skin  Heavy menstrual period that lasts longer than normal  Easy bruising or bleeding  Nausea that stops you from eating or drinking, and/or that is not relieved by prescribed medicines.  Loose bowel movements (diarrhea) more than 4 times a day or diarrhea with weakness or lightheadedness  Pain in your mouth or throat that makes it hard to eat or drink  Lasting loss of appetite or rapid weight loss of five pounds in a week  Signs of peripheral neuropathy: numbness, tingling, or decreased feeling in fingers or toes; trouble walking or changes in the way you walk; or feeling clumsy when buttoning clothes, opening jars, or other routine activities  Joint and muscle pain that is not  relieved by prescribed medicines  Extreme fatigue that  interferes with normal activities  While you are getting this drug, please tell your nurse right away if you have any pain, redness, or swelling at the site of the IV infusion.  If you think you are pregnant.   Reproduction Warnings    Pregnancy warning: This drug may have harmful effects on the unborn child, it is recommended that effective methods of birth control should be used during your cancer treatment. Let your doctor know right away if you think you may be pregnant.    Breast feeding warning: Women should not breast feed during treatment because this drug could enter the breastmilk and cause harm to a breast feeding baby.    SELF CARE ACTIVITIES WHILE ON CHEMOTHERAPY/IMMUNOTHERAPY:  Hydration Increase your fluid intake and drink at least 64 ounces (2 liters) of water/decaffeinated beverages per day. You can still have your cup of coffee or soda but these beverages do not count as part of the 64 ounces that you need to drink daily. Limit alcohol intake.  Medications Continue taking your normal prescription medication as prescribed. If you start any new herbal or new supplements please let us know first to make sure it is safe.  Mouth Care Have teeth cleaned professionally before starting treatment. Keep dentures and partial plates clean. Use soft toothbrush and do not use mouthwashes that contain alcohol. Biotene is a good mouthwash that is available at most pharmacies or may be ordered by calling [phone]. Use warm salt water gargles (1 teaspoon salt per 1 quart warm water) before and after meals and at bedtime. If you are still having problems with your mouth or sores in your mouth please call the clinic. If you need dental work, please let the doctor know before you go for your appointment so that we can coordinate the best possible time for you in regards to your chemo regimen. You need to also let your dentist know that you are actively taking chemo. We may need to do labs  prior to your dental appointment.  Skin Care Always use sunscreen that has not expired and with SPF (Sun Protection Factor) of 50 or higher. Wear hats to protect your head from the sun. Remember to use sunscreen on your hands, ears, face, & feet.  Use good moisturizing lotions such as udder cream, eucerin, or even Vaseline. Some chemotherapies can cause dry skin, color changes in your skin and nails.    Avoid long, hot showers or baths. Use gentle, fragrance-free soaps and laundry detergent. Use moisturizers, preferably creams or ointments rather than lotions because the thicker consistency is better at preventing skin dehydration. Apply the cream or ointment within 15 minutes of showering. Reapply moisturizer at night, and moisturize your hands every time after you wash them.   Infection Prevention Please wash your hands for at least 30 seconds using warm soapy water. Handwashing is the #1 way to prevent the spread of germs. Stay away from sick people or people who are getting over a cold. If you develop respiratory systems such as green/yellow mucus production or productive cough or persistent cough let us know and we will see if you need an antibiotic. It is a good idea to keep a pair of gloves on when going into grocery stores/[employer] to decrease your risk of coming into contact with germs on the carts, etc. Carry alcohol hand gel with you at all times and use it frequently if out in public. If your  temperature reaches 100.5 or higher please call the clinic and let us know.  If it is after hours or on the weekend please go to the ER if your temperature is over 100.4.  Please have your own personal thermometer at home to use.    Sex and bodily fluids If you are going to have sex, a condom must be used to protect the person that isn't taking immunotherapy. For a few days after treatment, immunotherapy can be excreted through your bodily fluids.  When using the toilet please close the lid and flush  the toilet twice.  Do this for a few day after you have had immunotherapy.   Contraception It is not known for sure whether or not immunotherapy drugs can be passed on through semen or secretions from the vagina. Because of this some doctors advise people to use a barrier method if you have sex during treatment. This applies to vaginal, anal or oral sex.  Generally, doctors advise a barrier method only for the time you are actually having the treatment and for about a week after your treatment.  Advice like this can be worrying, but this does not mean that you have to avoid being intimate with your partner. You can still have close contact with your partner and continue to enjoy sex.  Animals If you have cats or birds we ask that you not change the litter or change the cage.  Please have someone else do this for you while you are on immunotherapy.   Food Safety During and After Cancer Treatment Food safety is important for people both during and after cancer treatment. Cancer and cancer treatments, such as chemotherapy, radiation therapy, and stem cell/bone marrow transplantation, often weaken the immune system. This makes it harder for your body to protect itself from foodborne illness, also called food poisoning. Foodborne illness is caused by eating food that contains harmful bacteria, parasites, or viruses.  Foods to avoid Some foods have a higher risk of becoming tainted with bacteria. These include: Unwashed fresh fruit and vegetables, especially leafy vegetables that can hide dirt and other contaminants Raw sprouts, such as alfalfa sprouts Raw or undercooked beef, especially ground beef, or other raw or undercooked meat and poultry Fatty, fried, or spicy foods immediately before or after treatment.  These can sit heavy on your stomach and make you feel nauseous. Raw or undercooked shellfish, such as oysters. Sushi and sashimi, which often contain raw fish.  Unpasteurized beverages, such  as unpasteurized fruit juices, raw milk, raw yogurt, or cider Undercooked eggs, such as soft boiled, over easy, and poached; raw, unpasteurized eggs; or foods made with raw egg, such as homemade raw cookie dough and homemade mayonnaise  Simple steps for food safety  Shop smart. Do not buy food stored or displayed in an unclean area. Do not buy bruised or damaged fruits or vegetables. Do not buy cans that have cracks, dents, or bulges. Pick up foods that can spoil at the end of your shopping trip and store them in a cooler on the way home.  Prepare and clean up foods carefully. Rinse all fresh fruits and vegetables under running water, and dry them with a clean towel or paper towel. Clean the top of cans before opening them. After preparing food, wash your hands for 20 seconds with hot water and soap. Pay special attention to areas between fingers and under nails. Clean your utensils and dishes with hot water and soap. Disinfect your kitchen and cutting boards  using 1 teaspoon of liquid, unscented bleach mixed into 1 quart of water.    Dispose of old food. Eat canned and packaged food before its expiration date (the "use by" or "best before" date). Consume refrigerated leftovers within 3 to 4 days. After that time, throw out the food. Even if the food does not smell or look spoiled, it still may be unsafe. Some bacteria, such as Listeria, can grow even on foods stored in the refrigerator if they are kept for too long.  Take precautions when eating out. At restaurants, avoid buffets and salad bars where food sits out for a long time and comes in contact with many people. Food can become contaminated when someone with a virus, often a norovirus, or another "bug" handles it. Put any leftover food in a "to-go" container yourself, rather than having the server do it. And, refrigerate leftovers as soon as you get home. Choose restaurants that are clean and that are willing to prepare your food as  you order it cooked.    SYMPTOMS TO REPORT AS SOON AS POSSIBLE AFTER TREATMENT:  FEVER GREATER THAN 100.4 F CHILLS WITH OR WITHOUT FEVER NAUSEA AND VOMITING THAT IS NOT CONTROLLED WITH YOUR NAUSEA MEDICATION UNUSUAL SHORTNESS OF BREATH UNUSUAL BRUISING OR BLEEDING TENDERNESS IN MOUTH AND THROAT WITH OR WITHOUT PRESENCE OF ULCERS URINARY PROBLEMS BOWEL PROBLEMS UNUSUAL RASH     Wear comfortable clothing and clothing appropriate for easy access to any Portacath or PICC line. Let us know if there is anything that we can do to make your therapy better!   What to do if you need assistance after hours or on the weekends: CALL [phone].  HOLD on the line, do not hang up.  You will hear multiple messages but at the end you will be connected with a nurse triage line.  They will contact the doctor if necessary.  Most of the time they will be able to assist you.  Do not call the hospital operator.    I have been informed and understand all of the instructions given to me and have received a copy. I have been instructed to call the clinic [phone] or my family physician as soon as possible for continued medical care, if indicated. I do not have any more questions at this time but understand that I may call the Cancer Center or the Patient Navigator at [phone] during office hours should I have questions or need assistance in obtaining follow-up care.

## 2022-10-18 ENCOUNTER — Other Ambulatory Visit: Payer: Self-pay | Admitting: *Deleted

## 2022-10-18 ENCOUNTER — Inpatient Hospital Stay: Payer: Managed Care, Other (non HMO)

## 2022-10-18 DIAGNOSIS — Z170 Estrogen receptor positive status [ER+]: Secondary | ICD-10-CM

## 2022-10-18 MED ORDER — PEGFILGRASTIM-CBQV 6 MG/0.6ML ~~LOC~~ SOSY: PREFILLED_SYRINGE | SUBCUTANEOUS | 3 refills | Status: DC

## 2022-10-19 ENCOUNTER — Encounter: Payer: Self-pay | Admitting: Hematology

## 2022-10-19 ENCOUNTER — Ambulatory Visit: Payer: Managed Care, Other (non HMO)

## 2022-10-19 ENCOUNTER — Encounter (HOSPITAL_COMMUNITY)
Admission: RE | Admit: 2022-10-19 | Discharge: 2022-10-19 | Disposition: A | Discharge status: Home / Self Care | Payer: Managed Care, Other (non HMO) | Source: Ambulatory Visit | Attending: Hematology | Admitting: Hematology

## 2022-10-19 DIAGNOSIS — Z170 Estrogen receptor positive status [ER+]: Secondary | ICD-10-CM | POA: Diagnosis present

## 2022-10-19 DIAGNOSIS — C50212 Malignant neoplasm of upper-inner quadrant of left female breast: Secondary | ICD-10-CM | POA: Diagnosis present

## 2022-10-19 MED ORDER — FLUDEOXYGLUCOSE F - 18 (FDG) INJECTION
13.2200 | Freq: Once | INTRAVENOUS | Status: AC | PRN
  Administered 2022-10-19: 13.22 via INTRAVENOUS

## 2022-10-19 NOTE — Progress Notes (Signed)

## 2022-10-23 ENCOUNTER — Ambulatory Visit (HOSPITAL_BASED_OUTPATIENT_CLINIC_OR_DEPARTMENT_OTHER): Payer: Managed Care, Other (non HMO)

## 2022-10-23 DIAGNOSIS — C50912 Malignant neoplasm of unspecified site of left female breast: Secondary | ICD-10-CM

## 2022-10-23 DIAGNOSIS — Z170 Estrogen receptor positive status [ER+]: Secondary | ICD-10-CM | POA: Diagnosis not present

## 2022-10-23 LAB — ECHOCARDIOGRAM COMPLETE
Area-P 1/2: 3.53 cm2
Calc EF: 54.4 %
S' Lateral: 2.89 cm
Single Plane A2C EF: 50.6 %
Single Plane A4C EF: 57.5 %

## 2022-10-24 NOTE — Progress Notes (Signed)
Pharmacist Chemotherapy Monitoring - Initial Assessment    Anticipated start date: 10/25/22   The following has been reviewed per standard work regarding the patient's treatment regimen: The patient's diagnosis, treatment plan and drug doses, and organ/hematologic function Lab orders and baseline tests specific to treatment regimen  The treatment plan start date, drug sequencing, and pre-medications Prior authorization status  Patient's documented medication list, including drug-drug interaction screen and prescriptions for anti-emetics and supportive care specific to the treatment regimen The drug concentrations, fluid compatibility, administration routes, and timing of the medications to be used The patient's access for treatment and lifetime cumulative dose history, if applicable  The patient's medication allergies and previous infusion related reactions, if applicable   Changes made to treatment plan:  N/A  Follow up needed:  N/A   Stephens Shire, [facility], 10/24/2022  1:36 PM

## 2022-10-24 NOTE — Progress Notes (Signed)
[facility] [address] [place] [address]    Clinic Day:  10/25/2022  Referring physician: [name], MD  Patient Care Team: [name], MD as PCP - General (Internal Medicine) [name], MD as Consulting Physician (Hematology)   ASSESSMENT & PLAN:   Assessment: 1.  T1cN1 G2 left breast IDC, ER/PR positive, HER2 negative: - She felt a lump in her breast in December 2023. - Left breast subareolar mass at 9:00 biopsy (08/10/2022): grade 2 IDC, ER 95% strong intensity, PR 90% strong intensity, Ki-67 10%, HER2 1+ - Left mastectomy and SLNB (08/28/2022): 1.6 x 1.2 x 0.7 cm IDC, margins negative, LVI present, 1/1 lymph node with metastatic carcinoma, metastasis 15 mm, focal extracapsular extension.  PT1 cpN1a. 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  No prior history of arterial/venous thrombosis/strokes. - PET scan (10/19/2022): No evidence of hypermetabolic metastatic disease.  Diffuse hepatic steatosis. - Adjuvant AC started on 10/25/2022.   2.  Social/family history: - Seen today with her husband and daughter.  She worked as a Conservation officer, nature.  Current active smoker, 5 to 7 cigarettes/day, started at age 77. - Mother had breast cancer at age 31.  Father had skin cancer.  Paternal grandfather had colon cancer.  Maternal grandfather had prostate cancer.  1 maternal cousin had pancreatic cancer.  2 paternal cousins have cancer.    Plan: 1.  T1cN1 G2 left breast IDC, ER/PR positive, HER2 negative: - She had reexcision of skin edges at the mastectomy site by Dr. Henreitta Leber and port placed. - The wound has completely healed at this time. - I reviewed and discussed PET scan results.  We also reviewed echocardiogram which showed EF 55 to 60%. - Labs today: Normal CBC.  Creatinine is normal. - We discussed proceeding with cycle 1 of AC today.  We discussed side effects in detail.  She is planning to go to the beach next Wednesday for 3 days.   She will call us next Monday if she needs any fluids. - RTC 2 weeks for follow-up prior to cycle 2.  2.  Elevated LFTs: - AST and ALT are elevated at 49 and 79. - This is likely from fatty liver.  Will closely monitor.    No orders of the defined types were placed in this encounter.     I,Katie Daubenspeck,acting as a Neurosurgeon for [name], MD.,have documented all relevant documentation on the behalf of [name], MD,as directed by  [name], MD while in the presence of [name], MD.   I, [name] MD, have reviewed the above documentation for accuracy and completeness, and I agree with the above.   [name], MD   5/1/20246:07 PM  CHIEF COMPLAINT:   Diagnosis: left breast cancer    Cancer Staging  Breast cancer of upper-inner quadrant of left female breast [facility]) Staging form: Breast, AJCC 8th Edition - Clinical stage from 10/01/2022: Stage IB (cT1c, cN1, cM0, G2, ER+, PR+, HER2-) - Signed by [name], MD on 10/01/2022    Prior Therapy: left mastectomy and +SLNB with Dr. Algis Greenhouse on 08/28/22   Current Therapy:  Adjuvant chemotherapy with dose dense AC followed by weekly paclitaxel    HISTORY OF PRESENT ILLNESS:   Oncology History  Breast cancer of upper-inner quadrant of left female breast (HCC)  10/01/2022 Initial Diagnosis   Breast cancer of upper-inner quadrant of left female breast   10/01/2022 Cancer Staging   Staging form:  Breast, AJCC 8th Edition - Clinical stage from 10/01/2022: Stage IB (cT1c, cN1, cM0, G2, ER+, PR+, HER2-) - Signed by [name], MD on 10/01/2022 Histopathologic type: Infiltrating duct carcinoma, NOS Stage prefix: Initial diagnosis Nuclear grade: G2 Histologic grading system: 3 grade system   10/25/2022 -  Chemotherapy   Patient is on Treatment Plan : BREAST ADJUVANT DOSE DENSE AC q14d / PACLitaxel q7d       INTERVAL HISTORY:   Debralee is a 47 y.o. female  presenting to clinic today for follow up of left breast cancer. She was last seen by me on 10/02/22 in consultation.  Since her last visit, she underwent port placement on 10/05/22.  She also underwent staging PET scan on 10/19/22 showing: low-level FDG-avidity throughout anterior chest wall where there are two fluid collections, measuring 9.5 cm and 19 mm, favoring postsurgical change; no evidence of hypermetabolic metastatic disease.  Today, she states that she is doing well overall. Her appetite level is at 100%. Her energy level is at 50%.  PAST MEDICAL HISTORY:   Past Medical History: Past Medical History:  Diagnosis Date   Allergy    seasonal   Antiphospholipid syndrome (HCC)    Anxiety    Arthritis    deterioration of spine, knees   Carpal tunnel syndrome of right wrist    Clotting disorder (HCC)    antiphospholipid syndrome   Depression    Encounter for general adult medical examination with abnormal findings 07/03/2022   Essential hypertension, benign 02/10/2020   Hyperlipidemia 08/02/2022   Sleep disorder 02/22/2016    Surgical History: Past Surgical History:  Procedure Laterality Date   BREAST BIOPSY Left 08/10/2022   [place] LT BREAST BX W LOC DEV 1ST LESION IMG BX SPEC US GUIDE 08/10/2022 [facility]   BREAST BIOPSY Left 08/18/2022   MM LT BREAST BX W LOC DEV EA AD LESION IMG BX SPEC STEREO GUIDE 08/18/2022 [facility]   BREAST BIOPSY Left 08/18/2022   MM LT BREAST BX W LOC DEV 1ST LESION IMAGE BX SPEC STEREO GUIDE 08/18/2022 [facility]   CARPAL TUNNEL RELEASE Right 01/09/2019   Procedure: RIGHT CARPAL TUNNEL RELEASE;  Surgeon: Vickki Hearing, MD;  Location: [facility];  Service: Orthopedics;  Laterality: Right;   DILATION AND CURETTAGE OF UTERUS     x2   EYE SURGERY     lazy eye   IRRIGATION AND DEBRIDEMENT HEMATOMA Left 10/05/2022   Procedure: HEMATOMA EVACUATION OF LEFT BREAST WITH JP DRAIN PLACEMENT;  Surgeon: [name], MD;  Location: [facility]  ORS;  Service: General;  Laterality: Left;   PARTIAL HYSTERECTOMY  2011   PORTACATH PLACEMENT Right 10/05/2022   Procedure: INSERTION PORT-A-CATH;  Surgeon: [name], MD;  Location: [facility];  Service: General;  Laterality: Right;   SIMPLE MASTECTOMY WITH AXILLARY SENTINEL NODE BIOPSY Left 08/28/2022   Procedure: SIMPLE MASTECTOMY WITH AXILLARY SENTINEL NODE BIOPSY;  Surgeon: [name], MD;  Location: [facility];  Service: General;  Laterality: Left;    Social History: Social History   Socioeconomic History   Marital status: Married    Spouse name: Not on file   Number of children: 2   Years of education: Not on file   Highest education level: Not on file  Occupational History   Not on file  Tobacco Use   Smoking status: Every Day    Packs/day: 0.50    Years: 28.00    Additional pack years: 0.00    Total pack years:  14.00    Types: Cigarettes   Smokeless tobacco: Never   Tobacco comments:    She has been smoking since age 61, smokes half a pack daily,was doing one pack a day until last year   Vaping Use   Vaping Use: Never used  Substance and Sexual Activity   Alcohol use: No   Drug use: No   Sexual activity: Yes    Birth control/protection: Surgical  Other Topics Concern   Not on file  Social History Narrative   Associates degree in Engineer, site. 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 Has 2 children 1 son and 1 daughter. Married x 15 years.   Social Determinants of Health   Financial Resource Strain: Not on file  Food Insecurity: No Food Insecurity (08/30/2022)   Hunger Vital Sign    Worried About Running Out of Food in the Last Year: Never true    Ran Out of Food in the Last Year: Never true  Transportation Needs: No Transportation Needs (08/30/2022)   PRAPARE - Administrator, Civil Service (Medical): No    Lack of Transportation (Non-Medical): No  Physical Activity: Not on file  Stress: Not on file  Social  Connections: Not on file  Intimate Partner Violence: Not At Risk (08/30/2022)   Humiliation, Afraid, Rape, and Kick questionnaire    Fear of Current or Ex-Partner: No    Emotionally Abused: No    Physically Abused: No    Sexually Abused: No    Family History: Family History  Problem Relation Age of Onset   Cancer Mother 4       breast    Arthritis Mother    Hypertension Mother    Osteoporosis Mother    Thyroid disease Father    Hypertension Father    Skin cancer Father    Mental illness Sister        depression ? bipolar   Dementia Maternal Grandmother    Cancer Maternal Grandfather        prostate   Heart disease Paternal Grandmother    Cancer Paternal Grandfather        colon   Colon cancer Paternal Grandfather    Mental illness Daughter    Pancreatic cancer Cousin        maternal cousin   Cancer Cousin        paternal, unknown type   Cancer Cousin        paternal, unknown type   Cancer - Lung Neg Hx    Cervical cancer Neg Hx     Current Medications:  Current Outpatient Medications:    acetaminophen (TYLENOL) 500 MG tablet, Take 1,000 mg by mouth every 6 (six) hours as needed for headache., Disp: , Rfl:    cycloPHOSphamide (CYTOXAN IJ), Inject as directed., Disp: , Rfl:    DOXOrubicin HCl (ADRIAMYCIN IV), Inject into the vein., Disp: , Rfl:    escitalopram (LEXAPRO) 10 MG tablet, Take 1 tablet (10 mg total) by mouth daily., Disp: 90 tablet, Rfl: 0   hydrOXYzine (VISTARIL) 50 MG capsule, Take 1 capsule (50 mg total) by mouth daily as needed for anxiety., Disp: 30 capsule, Rfl: 2   losartan (COZAAR) 25 MG tablet, Take 1 tablet (25 mg total) by mouth daily., Disp: 90 tablet, Rfl: 1   oxyCODONE (OXY IR/ROXICODONE) 5 MG immediate release tablet, Take 1-2 tablets (5-10 mg total) by mouth every 4 (four) hours as needed for severe pain or breakthrough pain., Disp: 15  tablet, Rfl: 0   pegfilgrastim-cbqv (UDENYCA) 6 MG/0.6ML injection, Administer 6 mg (0.6 ml) subcutaneously  every 14 days on Day 3 of each treatment cycle - to be administered by a Registered Nurse, Disp: 0.6 mL, Rfl: 3   lidocaine-prilocaine (EMLA) cream, Apply to affected area once, Disp: 30 g, Rfl: 3   ondansetron (ZOFRAN-ODT) 4 MG disintegrating tablet, Take 1 tablet (4 mg total) by mouth every 6 (six) hours as needed for nausea., Disp: 20 tablet, Rfl: 0   prochlorperazine (COMPAZINE) 10 MG tablet, Take 1 tablet (10 mg total) by mouth every 6 (six) hours as needed for nausea or vomiting., Disp: 30 tablet, Rfl: 1 No current facility-administered medications for this visit.  Facility-Administered Medications Ordered in Other Visits:    sodium chloride flush (NS) 0.9 % injection 10 mL, 10 mL, Intracatheter, PRN, [name], MD, 10 mL at 10/25/22 1306   Allergies: No Known Allergies  REVIEW OF SYSTEMS:   Review of Systems  Constitutional:  Negative for chills, fatigue and fever.  HENT:   Negative for lump/mass, mouth sores, nosebleeds, sore throat and trouble swallowing.   Eyes:  Negative for eye problems.  Respiratory:  Negative for cough and shortness of breath.   Cardiovascular:  Negative for chest pain, leg swelling and palpitations.  Gastrointestinal:  Negative for abdominal pain, constipation, diarrhea, nausea and vomiting.  Genitourinary:  Negative for bladder incontinence, difficulty urinating, dysuria, frequency, hematuria and nocturia.   Musculoskeletal:  Negative for arthralgias, back pain, flank pain, myalgias and neck pain.  Skin:  Negative for itching and rash.  Neurological:  Negative for dizziness, headaches and numbness.  Hematological:  Does not bruise/bleed easily.  Psychiatric/Behavioral:  Positive for depression. Negative for sleep disturbance and suicidal ideas. The patient is not nervous/anxious.   All other systems reviewed and are negative.    VITALS:   There were no vitals taken for this visit.  Wt Readings from Last 3 Encounters:  10/25/22 274 lb  (124.3 kg)  10/17/22 268 lb (121.6 kg)  10/11/22 267 lb (121.1 kg)    There is no height or weight on file to calculate BMI.  Performance status (ECOG): 1 - Symptomatic but completely ambulatory  PHYSICAL EXAM:   Physical Exam Vitals and nursing note reviewed. Exam conducted with a chaperone present.  Constitutional:      Appearance: Normal appearance.  Cardiovascular:     Rate and Rhythm: Normal rate and regular rhythm.     Pulses: Normal pulses.     Heart sounds: Normal heart sounds.  Pulmonary:     Effort: Pulmonary effort is normal.     Breath sounds: Normal breath sounds.  Abdominal:     Palpations: Abdomen is soft. There is no hepatomegaly, splenomegaly or mass.     Tenderness: There is no abdominal tenderness.  Musculoskeletal:     Right lower leg: No edema.     Left lower leg: No edema.  Lymphadenopathy:     Cervical: No cervical adenopathy.     Right cervical: No superficial, deep or posterior cervical adenopathy.    Left cervical: No superficial, deep or posterior cervical adenopathy.     Upper Body:     Right upper body: No supraclavicular or axillary adenopathy.     Left upper body: No supraclavicular or axillary adenopathy.  Neurological:     General: No focal deficit present.     Mental Status: She is alert and oriented to person, place, and time.  Psychiatric:  Mood and Affect: Mood normal.        Behavior: Behavior normal.     LABS:      Latest Ref Rng & Units 10/25/2022    8:50 AM 08/30/2022    4:50 AM 08/29/2022    4:39 AM  CBC  WBC 4.0 - 10.5 K/uL 7.3  11.1  14.8   Hemoglobin 12.0 - 15.0 g/dL 91.4  78.2  95.6   Hematocrit 36.0 - 46.0 % 40.2  33.3  35.2   Platelets 150 - 400 K/uL 271  305  304       Latest Ref Rng & Units 10/25/2022    8:50 AM 08/30/2022    4:50 AM 08/29/2022    4:39 AM  CMP  Glucose 70 - 99 mg/dL 213  086  578   BUN 6 - 20 mg/dL 15  14  15    Creatinine 0.44 - 1.00 mg/dL 4.69  6.29  5.28   Sodium 135 - 145 mmol/L 138  140   136   Potassium 3.5 - 5.1 mmol/L 3.5  3.9  4.5   Chloride 98 - 111 mmol/L 105  107  105   CO2 22 - 32 mmol/L 22  24  22    Calcium 8.9 - 10.3 mg/dL 9.0  8.4  8.3   Total Protein 6.5 - 8.1 g/dL 7.1     Total Bilirubin 0.3 - 1.2 mg/dL 0.6     Alkaline Phos 38 - 126 U/L 89     AST 15 - 41 U/L 49     ALT 0 - 44 U/L 79        No results found for: "CEA1", "CEA" / No results found for: "CEA1", "CEA" No results found for: "PSA1" No results found for: "UXL244" No results found for: "CAN125"  No results found for: "TOTALPROTELP", "ALBUMINELP", "A1GS", "A2GS", "BETS", "BETA2SER", "GAMS", "MSPIKE", "SPEI" No results found for: "TIBC", "FERRITIN", "IRONPCTSAT" No results found for: "LDH"   STUDIES:   ECHOCARDIOGRAM COMPLETE  Result Date: 10/23/2022    ECHOCARDIOGRAM REPORT   Patient Name:   VERDIE WILMS Date of Exam: 10/23/2022 Medical Rec #:  [id number]     Height:       67.0 in Accession #:    [id number]    Weight:       268.0 lb Date of Birth:  Nov 23, 1975     BSA:          2.290 m Patient Age:    46 years      BP:           125/80 mmHg Patient Gender: F             HR:           55 bpm. Exam Location:  Outpatient Procedure: 2D Echo, 3D Echo, Cardiac Doppler, Color Doppler and Strain Analysis Indications:    Chemo  History:        Patient has no prior history of Echocardiogram examinations.                 Risk Factors:Current Smoker, Hypertension, Diabetes and                 Dyslipidemia. Family history of Breast cancer; Left breast                 cancer.  Sonographer:    Jeryl Columbia RDCS Referring Phys: [id number] [facility]  Sonographer Comments: Left mastectomy 10/05/22. IMPRESSIONS  1. Left ventricular  ejection fraction, by estimation, is 55 to 60%. The left ventricle has normal function. The left ventricle has no regional wall motion abnormalities. Left ventricular diastolic parameters were normal. The average left ventricular global longitudinal strain is -17.5 %. The global  longitudinal strain is mildly abnormal.  2. Right ventricular systolic function is normal. The right ventricular size is normal. Tricuspid regurgitation signal is inadequate for assessing PA pressure.  3. The mitral valve is grossly normal. Trivial mitral valve regurgitation.  4. The aortic valve was not well visualized. Aortic valve regurgitation is not visualized. No aortic stenosis is present.  5. The inferior vena cava is normal in size with <50% respiratory variability, suggesting right atrial pressure of 8 mmHg. Comparison(s): No prior Echocardiogram. FINDINGS  Left Ventricle: Left ventricular ejection fraction, by estimation, is 55 to 60%. The left ventricle has normal function. The left ventricle has no regional wall motion abnormalities. The average left ventricular global longitudinal strain is -17.5 %. The global longitudinal strain is abnormal. 3D ejection fraction reviewed and evaluated as part of the interpretation. Alternate measurement of EF is felt to be most reflective of LV function. The left ventricular internal cavity size was normal in size.  There is no left ventricular hypertrophy. Left ventricular diastolic parameters were normal. Right Ventricle: The right ventricular size is normal. No increase in right ventricular wall thickness. Right ventricular systolic function is normal. Tricuspid regurgitation signal is inadequate for assessing PA pressure. Left Atrium: Left atrial size was normal in size. Right Atrium: Right atrial size was normal in size. Pericardium: There is no evidence of pericardial effusion. Mitral Valve: The mitral valve is grossly normal. Trivial mitral valve regurgitation. Tricuspid Valve: The tricuspid valve is normal in structure. Tricuspid valve regurgitation is trivial. Aortic Valve: The aortic valve was not well visualized. Aortic valve regurgitation is not visualized. No aortic stenosis is present. Pulmonic Valve: The pulmonic valve was not well visualized. Aorta:  The aortic root is normal in size and structure. Venous: The inferior vena cava is normal in size with less than 50% respiratory variability, suggesting right atrial pressure of 8 mmHg. IAS/Shunts: The atrial septum is grossly normal.  LEFT VENTRICLE PLAX 2D LVIDd:         3.92 cm      Diastology LVIDs:         2.89 cm      LV e' medial:    8.81 cm/s LV PW:         1.37 cm      LV E/e' medial:  8.4 LV IVS:        1.33 cm      LV e' lateral:   9.25 cm/s LVOT diam:     2.10 cm      LV E/e' lateral: 8.0 LV SV:         55 LV SV Index:   24           2D Longitudinal Strain LVOT Area:     3.46 cm     2D Strain GLS Avg:     -17.5 %  LV Volumes (MOD) LV vol d, MOD A2C: 131.0 ml LV vol d, MOD A4C: 134.0 ml LV vol s, MOD A2C: 64.7 ml LV vol s, MOD A4C: 56.9 ml LV SV MOD A2C:     66.3 ml LV SV MOD A4C:     134.0 ml LV SV MOD BP:      73.3 ml RIGHT VENTRICLE RV Basal diam:  4.70  cm RV Mid diam:    3.55 cm RV S prime:     13.90 cm/s TAPSE (M-mode): 2.4 cm LEFT ATRIUM             Index        RIGHT ATRIUM           Index LA diam:        3.70 cm 1.62 cm/m   RA Area:     13.20 cm LA Vol (A2C):   61.7 ml 26.94 ml/m  RA Volume:   32.90 ml  14.37 ml/m LA Vol (A4C):   40.3 ml 17.60 ml/m LA Biplane Vol: 49.6 ml 21.66 ml/m  AORTIC VALVE LVOT Vmax:   80.70 cm/s LVOT Vmean:  52.400 cm/s LVOT VTI:    0.160 m  AORTA Ao Root diam: 3.00 cm MITRAL VALVE MV Area (PHT): 3.53 cm    SHUNTS MV Decel Time: 215 msec    Systemic VTI:  0.16 m MV E velocity: 73.90 cm/s  Systemic Diam: 2.10 cm MV A velocity: 67.60 cm/s MV E/A ratio:  1.09 Laurance Flatten MD Electronically signed by Laurance Flatten MD Signature Date/Time: 10/23/2022/2:30:36 PM    Final    NM PET Image Initial (PI) Skull Base To Thigh  Result Date: 10/22/2022 CLINICAL DATA:  Initial treatment strategy for breast cancer. EXAM: NUCLEAR MEDICINE PET SKULL BASE TO THIGH TECHNIQUE: 13.22 mCi F-18 FDG was injected intravenously. Full-ring PET imaging was performed from the skull  base to thigh after the radiotracer. CT data was obtained and used for attenuation correction and anatomic localization. Fasting blood glucose: 108 mg/dl COMPARISON:  None Available. FINDINGS: Mediastinal blood pool activity: SUV max 2.5 Liver activity: SUV max NA NECK: No hypermetabolic lymph nodes in the neck. Incidental CT findings: None. CHEST: Low-level FDG avidity throughout the anterior chest wall where there are 2 fluid collections the larger measuring 9.5 x 1.9 cm in the smaller measuring 19 x 11 mm, with the smaller collection demonstrates an internal focus of gas adjacent, there adjacent fluid stranding with some overlying skin thickening and max SUV of 5.72. No hypermetabolic thoracic adenopathy. No hypermetabolic pulmonary nodules or masses. Incidental CT findings: Surgical clips in the left axilla. Motion degraded examination reveals no suspicious pulmonary nodules or masses. Right chest Port-A-Cath with tip at the superior cavoatrial junction. ABDOMEN/PELVIS: No abnormal hypermetabolic activity within the liver, pancreas, adrenal glands, or spleen. No hypermetabolic lymph nodes in the abdomen or pelvis. Incidental CT findings: Diffuse hepatic steatosis with focal fatty sparing along the gallbladder fossa. Aortic atherosclerosis. SKELETON: No focal hypermetabolic activity to suggest skeletal metastasis. Incidental CT findings: Bilateral L5 pars defects with grade 1 L5-S1 anterolisthesis. IMPRESSION: 1. Low-level FDG avidity throughout the anterior chest wall where there are 2 fluid collections the larger measuring 9.5 x 1.9 cm, with the smaller measuring 19 x 11 mm, with adjacent fluid stranding with some overlying skin thickening and max SUV 5.72. Findings are favored to reflect postsurgical change. Suggest attention on follow-up imaging. 2. No evidence of hypermetabolic metastatic disease. 3. Diffuse hepatic steatosis with focal fatty sparing along the gallbladder fossa. 4. Bilateral L5 pars defects  with grade 1 L5-S1 anterolisthesis. 5.  Aortic Atherosclerosis (ICD10-I70.0). Electronically Signed   By: Maudry Mayhew M.D.   On: 10/22/2022 09:09   DG Chest Port 1 View  Result Date: 10/05/2022 CLINICAL DATA:  Port-A-Cath placement. EXAM: PORTABLE CHEST 1 VIEW COMPARISON:  August 28, 2022 FINDINGS: Right subclavian approach central venous catheter has been placed  with tip at the expected location of the cavoatrial junction. Cardiomediastinal silhouette is normal for technique. Mediastinal contours appear intact. There is no evidence of focal airspace consolidation, pleural effusion or pneumothorax. Low lung volumes. Crowding of the interstitium. Osseous structures are without acute abnormality. Soft tissues are grossly normal. IMPRESSION: No pneumothorax post right subclavian approach central venous catheter placement. Possible mild pulmonary edema. Electronically Signed   By: Ted Mcalpine M.D.   On: 10/05/2022 13:11

## 2022-10-25 ENCOUNTER — Inpatient Hospital Stay: Payer: Managed Care, Other (non HMO) | Attending: Hematology

## 2022-10-25 ENCOUNTER — Inpatient Hospital Stay (HOSPITAL_BASED_OUTPATIENT_CLINIC_OR_DEPARTMENT_OTHER): Payer: Managed Care, Other (non HMO) | Admitting: Hematology

## 2022-10-25 ENCOUNTER — Inpatient Hospital Stay: Payer: Managed Care, Other (non HMO)

## 2022-10-25 VITALS — BP 147/96 | HR 92 | Temp 98.1°F | Resp 18 | Wt 274.0 lb

## 2022-10-25 VITALS — BP 120/76 | HR 76 | Temp 98.1°F | Resp 18

## 2022-10-25 DIAGNOSIS — Z79899 Other long term (current) drug therapy: Secondary | ICD-10-CM | POA: Insufficient documentation

## 2022-10-25 DIAGNOSIS — Z5111 Encounter for antineoplastic chemotherapy: Secondary | ICD-10-CM | POA: Diagnosis present

## 2022-10-25 DIAGNOSIS — Z808 Family history of malignant neoplasm of other organs or systems: Secondary | ICD-10-CM | POA: Diagnosis not present

## 2022-10-25 DIAGNOSIS — F17210 Nicotine dependence, cigarettes, uncomplicated: Secondary | ICD-10-CM | POA: Diagnosis not present

## 2022-10-25 DIAGNOSIS — Z9012 Acquired absence of left breast and nipple: Secondary | ICD-10-CM | POA: Diagnosis not present

## 2022-10-25 DIAGNOSIS — C50212 Malignant neoplasm of upper-inner quadrant of left female breast: Secondary | ICD-10-CM

## 2022-10-25 DIAGNOSIS — C50112 Malignant neoplasm of central portion of left female breast: Secondary | ICD-10-CM

## 2022-10-25 DIAGNOSIS — Z170 Estrogen receptor positive status [ER+]: Secondary | ICD-10-CM | POA: Diagnosis not present

## 2022-10-25 DIAGNOSIS — Z800 Family history of malignant neoplasm of digestive organs: Secondary | ICD-10-CM | POA: Diagnosis not present

## 2022-10-25 DIAGNOSIS — Z95828 Presence of other vascular implants and grafts: Secondary | ICD-10-CM

## 2022-10-25 DIAGNOSIS — Z803 Family history of malignant neoplasm of breast: Secondary | ICD-10-CM | POA: Insufficient documentation

## 2022-10-25 DIAGNOSIS — Z5189 Encounter for other specified aftercare: Secondary | ICD-10-CM | POA: Insufficient documentation

## 2022-10-25 LAB — COMPREHENSIVE METABOLIC PANEL
ALT: 79 U/L — ABNORMAL HIGH (ref 0–44)
AST: 49 U/L — ABNORMAL HIGH (ref 15–41)
Albumin: 3.8 g/dL (ref 3.5–5.0)
Alkaline Phosphatase: 89 U/L (ref 38–126)
Anion gap: 11 (ref 5–15)
BUN: 15 mg/dL (ref 6–20)
CO2: 22 mmol/L (ref 22–32)
Calcium: 9 mg/dL (ref 8.9–10.3)
Chloride: 105 mmol/L (ref 98–111)
Creatinine, Ser: 0.74 mg/dL (ref 0.44–1.00)
GFR, Estimated: >60 mL/min (ref >60)
Glucose, Bld: 178 mg/dL — ABNORMAL HIGH (ref 70–99)
Potassium: 3.5 mmol/L (ref 3.5–5.1)
Sodium: 138 mmol/L (ref 135–145)
Total Bilirubin: 0.6 mg/dL (ref 0.3–1.2)
Total Protein: 7.1 g/dL (ref 6.5–8.1)

## 2022-10-25 LAB — CBC WITH DIFFERENTIAL/PLATELET
Abs Immature Granulocytes: 0.09 10*3/uL — ABNORMAL HIGH (ref 0.00–0.07)
Basophils Absolute: 0 10*3/uL (ref 0.0–0.1)
Basophils Relative: 1 %
Eosinophils Absolute: 0.6 10*3/uL — ABNORMAL HIGH (ref 0.0–0.5)
Eosinophils Relative: 8 %
HCT: 40.2 % (ref 36.0–46.0)
Hemoglobin: 12.8 g/dL (ref 12.0–15.0)
Immature Granulocytes: 1 %
Lymphocytes Relative: 23 %
Lymphs Abs: 1.6 10*3/uL (ref 0.7–4.0)
MCH: 26.9 pg (ref 26.0–34.0)
MCHC: 31.8 g/dL (ref 30.0–36.0)
MCV: 84.5 fL (ref 80.0–100.0)
Monocytes Absolute: 0.4 10*3/uL (ref 0.1–1.0)
Monocytes Relative: 5 %
Neutro Abs: 4.6 10*3/uL (ref 1.7–7.7)
Neutrophils Relative %: 62 %
Platelets: 271 10*3/uL (ref 150–400)
RBC: 4.76 MIL/uL (ref 3.87–5.11)
RDW: 13.7 % (ref 11.5–15.5)
WBC: 7.3 10*3/uL (ref 4.0–10.5)
nRBC: 0 % (ref 0.0–0.2)

## 2022-10-25 LAB — MAGNESIUM: Magnesium: 1.8 mg/dL (ref 1.7–2.4)

## 2022-10-25 MED ORDER — SODIUM CHLORIDE 0.9 % IV SOLN: Freq: Once | INTRAVENOUS | Status: AC

## 2022-10-25 MED ORDER — SODIUM CHLORIDE 0.9 % IV SOLN
10.0000 mg | Freq: Once | INTRAVENOUS | Status: AC
  Administered 2022-10-25: 10 mg via INTRAVENOUS
  Filled 2022-10-25: qty 10

## 2022-10-25 MED ORDER — SODIUM CHLORIDE 0.9 % IV SOLN
150.0000 mg | Freq: Once | INTRAVENOUS | Status: AC
  Administered 2022-10-25: 150 mg via INTRAVENOUS
  Filled 2022-10-25: qty 150

## 2022-10-25 MED ORDER — SODIUM CHLORIDE 0.9% FLUSH
10.0000 mL | INTRAVENOUS | Status: DC | PRN
  Administered 2022-10-25: 10 mL

## 2022-10-25 MED ORDER — DOXORUBICIN HCL CHEMO IV INJECTION 2 MG/ML
60.0000 mg/m2 | Freq: Once | INTRAVENOUS | Status: AC
  Administered 2022-10-25: 144 mg via INTRAVENOUS
  Filled 2022-10-25: qty 72

## 2022-10-25 MED ORDER — SODIUM CHLORIDE 0.9% FLUSH
10.0000 mL | Freq: Once | INTRAVENOUS | Status: AC
  Administered 2022-10-25: 10 mL via INTRAVENOUS

## 2022-10-25 MED ORDER — SODIUM CHLORIDE 0.9 % IV SOLN
600.0000 mg/m2 | Freq: Once | INTRAVENOUS | Status: AC
  Administered 2022-10-25: 1500 mg via INTRAVENOUS
  Filled 2022-10-25: qty 75

## 2022-10-25 MED ORDER — PALONOSETRON HCL INJECTION 0.25 MG/5ML
0.2500 mg | Freq: Once | INTRAVENOUS | Status: AC
  Administered 2022-10-25: 0.25 mg via INTRAVENOUS
  Filled 2022-10-25: qty 5

## 2022-10-25 MED ORDER — HEPARIN SOD (PORK) LOCK FLUSH 100 UNIT/ML IV SOLN
500.0000 [IU] | Freq: Once | INTRAVENOUS | Status: AC | PRN
  Administered 2022-10-25: 500 [IU]

## 2022-10-25 NOTE — Progress Notes (Addendum)
Patient presents today for C1D1 Doxorubicin and Cytoxan infusion. Patient is in satisfactory condition with no new complaints voiced.  Vital signs are stable.  Labs reviewed by Dr. Ellin Saba during the office visit and all labs are within treatment parameters.  We will proceed with treatment per MD orders.   Per Pryor Ochoa, PharmD pt will be able to receive the first two Udenyca shots in the clinic, then receive the remainder shots at home with a nurse coming in to administer the shots due to insurance requirement. Pt made aware and verbalized understanding  C1D1 Doxorubicin and Cytoxan given today per MD orders. Tolerated infusion without adverse affects. Vital signs stable. No complaints at this time. Discharged from clinic ambulatory in stable condition. Alert and oriented x 3. F/U with [facility] as scheduled.

## 2022-10-25 NOTE — Progress Notes (Signed)
Patient has been examined by Dr. Katragadda. Vital signs and labs have been reviewed by MD - ANC, Creatinine, LFTs, hemoglobin, and platelets are within treatment parameters per M.D. - pt may proceed with treatment.  Primary RN and pharmacy notified.  

## 2022-10-25 NOTE — Patient Instructions (Signed)
[facility]  Discharge Instructions: Thank you for choosing [facility] to provide your oncology and hematology care.  If you have a lab appointment with the Cancer Center - please note that after April 8th, 2024, all labs will be drawn in the cancer center.  You do not have to check in or register with the main entrance as you have in the past but will complete your check-in in the cancer center.  Wear comfortable clothing and clothing appropriate for easy access to any Portacath or PICC line.   We strive to give you quality time with your provider. You may need to reschedule your appointment if you arrive late (15 or more minutes).  Arriving late affects you and other patients whose appointments are after yours.  Also, if you miss three or more appointments without notifying the office, you may be dismissed from the clinic at the provider's discretion.      For prescription refill requests, have your pharmacy contact our office and allow 72 hours for refills to be completed.    Today you received the following chemotherapy and/or immunotherapy agents Doxorubicin and Cytoxan   To help prevent nausea and vomiting after your treatment, we encourage you to take your nausea medication as directed.  Doxorubicin Injection What is this medication? DOXORUBICIN (dox oh ROO bi sin) treats some types of cancer. It works by slowing down the growth of cancer cells. This medicine may be used for other purposes; ask your health care provider or pharmacist if you have questions. COMMON BRAND NAME(S): Adriamycin, Adriamycin PFS, Adriamycin RDF, Rubex What should I tell my care team before I take this medication? They need to know if you have any of these conditions: Heart disease History of low blood cell levels caused by a medication Liver disease Recent or ongoing radiation An unusual or allergic reaction to doxorubicin, other medications, foods, dyes, or  preservatives If you or your partner are pregnant or trying to get pregnant Breast-feeding How should I use this medication? This medication is injected into a vein. It is given by your care team in a hospital or clinic setting. Talk to your care team about the use of this medication in children. Special care may be needed. Overdosage: If you think you have taken too much of this medicine contact a poison control center or emergency room at once. NOTE: This medicine is only for you. Do not share this medicine with others. What if I miss a dose? Keep appointments for follow-up doses. It is important not to miss your dose. Call your care team if you are unable to keep an appointment. What may interact with this medication? 6-mercaptopurine Paclitaxel Phenytoin St. John's wort Trastuzumab Verapamil This list may not describe all possible interactions. Give your health care provider a list of all the medicines, herbs, non-prescription drugs, or dietary supplements you use. Also tell them if you smoke, drink alcohol, or use illegal drugs. Some items may interact with your medicine. What should I watch for while using this medication? Your condition will be monitored carefully while you are receiving this medication. You may need blood work while taking this medication. This medication may make you feel generally unwell. This is not uncommon as chemotherapy can affect healthy cells as well as cancer cells. Report any side effects. Continue your course of treatment even though you feel ill unless your care team tells you to stop. There is a maximum amount of this medication you should   receive throughout your life. The amount depends on the medical condition being treated and your overall health. Your care team will watch how much of this medication you receive. Tell your care team if you have taken this medication before. Your urine may turn red for a few days after your dose. This is not blood. If  your urine is dark or brown, call your care team. In some cases, you may be given additional medications to help with side effects. Follow all directions for their use. This medication may increase your risk of getting an infection. Call your care team for advice if you get a fever, chills, sore throat, or other symptoms of a cold or flu. Do not treat yourself. Try to avoid being around people who are sick. This medication may increase your risk to bruise or bleed. Call your care team if you notice any unusual bleeding. Talk to your care team about your risk of cancer. You may be more at risk for certain types of cancers if you take this medication. You should make sure that you get enough Coenzyme Q10 while you are taking this medication. Discuss the foods you eat and the vitamins you take with your care team. Talk to your care team if you or your partner may be pregnant. Serious birth defects can occur if you take this medication during pregnancy and for 6 months after the last dose. Contraception is recommended while taking this medication and for 6 months after the last dose. Your care team can help you find the option that works for you. If your partner can get pregnant, use a condom while taking this medication and for 6 months after the last dose. Do not breastfeed while taking this medication. This medication may cause infertility. Talk to your care team if you are concerned about your fertility. What side effects may I notice from receiving this medication? Side effects that you should report to your care team as soon as possible: Allergic reactions--skin rash, itching, hives, swelling of the face, lips, tongue, or throat Heart failure--shortness of breath, swelling of the ankles, feet, or hands, sudden weight gain, unusual weakness or fatigue Heart rhythm changes--fast or irregular heartbeat, dizziness, feeling faint or lightheaded, chest pain, trouble breathing Infection--fever, chills,  cough, sore throat, wounds that don't heal, pain or trouble when passing urine, general feeling of discomfort or being unwell Low red blood cell level--unusual weakness or fatigue, dizziness, headache, trouble breathing Painful swelling, warmth, or redness of the skin, blisters or sores at the infusion site Unusual bruising or bleeding Side effects that usually do not require medical attention (report to your care team if they continue or are bothersome): Diarrhea Hair loss Nausea Pain, redness, or swelling with sores inside the mouth or throat Red urine This list may not describe all possible side effects. Call your doctor for medical advice about side effects. You may report side effects to FDA at 1-800-FDA-1088. Where should I keep my medication? This medication is given in a hospital or clinic. It will not be stored at home. NOTE: This sheet is a summary. It may not cover all possible information. If you have questions about this medicine, talk to your doctor, pharmacist, or health care provider.  2023 Elsevier/Gold Standard (2021-10-19 00:00:00)  Cyclophosphamide Injection What is this medication? CYCLOPHOSPHAMIDE (sye kloe FOSS fa mide) treats some types of cancer. It works by slowing down the growth of cancer cells. This medicine may be used for other purposes; ask your health care   provider or pharmacist if you have questions. COMMON BRAND NAME(S): Cyclophosphamide, Cytoxan, Neosar What should I tell my care team before I take this medication? They need to know if you have any of these conditions: Heart disease Irregular heartbeat or rhythm Infection Kidney problems Liver disease Low blood cell levels (white cells, platelets, or red blood cells) Lung disease Previous radiation Trouble passing urine An unusual or allergic reaction to cyclophosphamide, other medications, foods, dyes, or preservatives Pregnant or trying to get pregnant Breast-feeding How should I use this  medication? This medication is injected into a vein. It is given by your care team in a hospital or clinic setting. Talk to your care team about the use of this medication in children. Special care may be needed. Overdosage: If you think you have taken too much of this medicine contact a poison control center or emergency room at once. NOTE: This medicine is only for you. Do not share this medicine with others. What if I miss a dose? Keep appointments for follow-up doses. It is important not to miss your dose. Call your care team if you are unable to keep an appointment. What may interact with this medication? Amphotericin B Amiodarone Azathioprine Certain antivirals for HIV or hepatitis Certain medications for blood pressure, such as enalapril, lisinopril, quinapril Cyclosporine Diuretics Etanercept Indomethacin Medications that relax muscles Metronidazole Natalizumab Tamoxifen Warfarin This list may not describe all possible interactions. Give your health care provider a list of all the medicines, herbs, non-prescription drugs, or dietary supplements you use. Also tell them if you smoke, drink alcohol, or use illegal drugs. Some items may interact with your medicine. What should I watch for while using this medication? This medication may make you feel generally unwell. This is not uncommon as chemotherapy can affect healthy cells as well as cancer cells. Report any side effects. Continue your course of treatment even though you feel ill unless your care team tells you to stop. You may need blood work while you are taking this medication. This medication may increase your risk of getting an infection. Call your care team for advice if you get a fever, chills, sore throat, or other symptoms of a cold or flu. Do not treat yourself. Try to avoid being around people who are sick. Avoid taking medications that contain aspirin, acetaminophen, ibuprofen, naproxen, or ketoprofen unless  instructed by your care team. These medications may hide a fever. Be careful brushing or flossing your teeth or using a toothpick because you may get an infection or bleed more easily. If you have any dental work done, tell your dentist you are receiving this medication. Drink water or other fluids as directed. Urinate often, even at night. Some products may contain alcohol. Ask your care team if this medication contains alcohol. Be sure to tell all care teams you are taking this medicine. Certain medicines, like metronidazole and disulfiram, can cause an unpleasant reaction when taken with alcohol. The reaction includes flushing, headache, nausea, vomiting, sweating, and increased thirst. The reaction can last from 30 minutes to several hours. Talk to your care team if you wish to become pregnant or think you might be pregnant. This medication can cause serious birth defects if taken during pregnancy and for 1 year after the last dose. A negative pregnancy test is required before starting this medication. A reliable form of contraception is recommended while taking this medication and for 1 year after the last dose. Talk to your care team about reliable forms of contraception.   Do not father a child while taking this medication and for 4 months after the last dose. Use a condom during this time period. Do not breast-feed while taking this medication or for 1 week after the last dose. This medication may cause infertility. Talk to your care team if you are concerned about your fertility. Talk to your care team about your risk of cancer. You may be more at risk for certain types of cancer if you take this medication. What side effects may I notice from receiving this medication? Side effects that you should report to your care team as soon as possible: Allergic reactions--skin rash, itching, hives, swelling of the face, lips, tongue, or throat Dry cough, shortness of breath or trouble breathing Heart  failure--shortness of breath, swelling of the ankles, feet, or hands, sudden weight gain, unusual weakness or fatigue Heart muscle inflammation--unusual weakness or fatigue, shortness of breath, chest pain, fast or irregular heartbeat, dizziness, swelling of the ankles, feet, or hands Heart rhythm changes--fast or irregular heartbeat, dizziness, feeling faint or lightheaded, chest pain, trouble breathing Infection--fever, chills, cough, sore throat, wounds that don't heal, pain or trouble when passing urine, general feeling of discomfort or being unwell Kidney injury--decrease in the amount of urine, swelling of the ankles, hands, or feet Liver injury--right upper belly pain, loss of appetite, nausea, light-colored stool, dark yellow or brown urine, yellowing skin or eyes, unusual weakness or fatigue Low red blood cell level--unusual weakness or fatigue, dizziness, headache, trouble breathing Low sodium level--muscle weakness, fatigue, dizziness, headache, confusion Red or dark brown urine Unusual bruising or bleeding Side effects that usually do not require medical attention (report to your care team if they continue or are bothersome): Hair loss Irregular menstrual cycles or spotting Loss of appetite Nausea Pain, redness, or swelling with sores inside the mouth or throat Vomiting This list may not describe all possible side effects. Call your doctor for medical advice about side effects. You may report side effects to FDA at 1-800-FDA-1088. Where should I keep my medication? This medication is given in a hospital or clinic. It will not be stored at home. NOTE: This sheet is a summary. It may not cover all possible information. If you have questions about this medicine, talk to your doctor, pharmacist, or health care provider.  2023 Elsevier/Gold Standard (2021-08-02 00:00:00)    BELOW ARE SYMPTOMS THAT SHOULD BE REPORTED IMMEDIATELY: *FEVER GREATER THAN 100.4 F (38 C) OR HIGHER *CHILLS  OR SWEATING *NAUSEA AND VOMITING THAT IS NOT CONTROLLED WITH YOUR NAUSEA MEDICATION *UNUSUAL SHORTNESS OF BREATH *UNUSUAL BRUISING OR BLEEDING *URINARY PROBLEMS (pain or burning when urinating, or frequent urination) *BOWEL PROBLEMS (unusual diarrhea, constipation, pain near the anus) TENDERNESS IN MOUTH AND THROAT WITH OR WITHOUT PRESENCE OF ULCERS (sore throat, sores in mouth, or a toothache) UNUSUAL RASH, SWELLING OR PAIN  UNUSUAL VAGINAL DISCHARGE OR ITCHING   Items with * indicate a potential emergency and should be followed up as soon as possible or go to the Emergency Department if any problems should occur.  Please show the CHEMOTHERAPY ALERT CARD or IMMUNOTHERAPY ALERT CARD at check-in to the Emergency Department and triage nurse.  Should you have questions after your visit or need to cancel or reschedule your appointment, please contact [facility] [phone]  and follow the prompts.  Office hours are 8:00 a.m. to 4:30 p.m. Monday - Friday. Please note that voicemails left after 4:00 p.m. may not be returned until the following business day.    We are closed weekends and major holidays. You have access to a nurse at all times for urgent questions. Please call the main number to the clinic [phone] and follow the prompts.  For any non-urgent questions, you may also contact your provider using MyChart. We now offer e-Visits for anyone 18 and older to request care online for non-urgent symptoms. For details visit [email or web address].   Also download the MyChart app! Go to the app store, search "MyChart", open the app, select [place], and log in with your MyChart username and password.   

## 2022-10-25 NOTE — Patient Instructions (Signed)
[facility] Discharge Instructions   You were seen and examined today by Dr. Katragadda.  He reviewed the results of your lab work which are normal/stable.   We will proceed with your treatment today.  Return as scheduled.    Thank you for choosing [facility] at [facility] to provide your oncology and hematology care.  To afford each patient quality time with our provider, please arrive at least 15 minutes before your scheduled appointment time.   If you have a lab appointment with the Cancer Center please come in thru the Main Entrance and check in at the main information desk.  You need to re-schedule your appointment should you arrive 10 or more minutes late.  We strive to give you quality time with our providers, and arriving late affects you and other patients whose appointments are after yours.  Also, if you no show three or more times for appointments you may be dismissed from the clinic at the providers discretion.     Again, thank you for choosing [facility].  Our hope is that these requests will decrease the amount of time that you wait before being seen by our physicians.       _____________________________________________________________  Should you have questions after your visit to  Cancer Center, please contact our office at [phone] and follow the prompts.  Our office hours are 8:00 a.m. and 4:30 p.m. Monday - Friday.  Please note that voicemails left after 4:00 p.m. may not be returned until the following business day.  We are closed weekends and major holidays.  You do have access to a nurse 24-7, just call the main number to the clinic [phone] and do not press any options, hold on the line and a nurse will answer the phone.    For prescription refill requests, have your pharmacy contact our office and allow 72 hours.    Due to Covid, you will need to wear a mask upon entering  the hospital. If you do not have a mask, a mask will be given to you at the Main Entrance upon arrival. For doctor visits, patients may have 1 support person age 18 or older with them. For treatment visits, patients can not have anyone with them due to social distancing guidelines and our immunocompromised population.      

## 2022-10-26 ENCOUNTER — Inpatient Hospital Stay: Payer: Managed Care, Other (non HMO)

## 2022-10-26 VITALS — BP 137/85 | HR 80 | Temp 98.7°F | Resp 18

## 2022-10-26 DIAGNOSIS — Z170 Estrogen receptor positive status [ER+]: Secondary | ICD-10-CM

## 2022-10-26 DIAGNOSIS — Z5111 Encounter for antineoplastic chemotherapy: Secondary | ICD-10-CM | POA: Diagnosis not present

## 2022-10-26 MED ORDER — PEGFILGRASTIM-CBQV 6 MG/0.6ML ~~LOC~~ SOSY
6.0000 mg | PREFILLED_SYRINGE | Freq: Once | SUBCUTANEOUS | Status: AC
  Administered 2022-10-26: 6 mg via SUBCUTANEOUS
  Filled 2022-10-26: qty 0.6

## 2022-10-26 NOTE — Progress Notes (Signed)
Patient presents today 24- hour after treatment. Patient reports feeling tired, and did have a little bit of nausea however patient took nausea medication and nausea resolved. Patient advised to call cancer center before next appointment if symptoms are not manageable.    Patient tolerated injection with no complaints voiced. Site clean and dry with no bruising or swelling noted at site. See MAR for details. Band aid applied.  Patient stable during and after injection. VSS with discharge and left in satisfactory condition with no s/s of distress noted.

## 2022-10-26 NOTE — Patient Instructions (Signed)
[facility]  Discharge Instructions: Thank you for choosing [facility] to provide your oncology and hematology care.  If you have a lab appointment with the Cancer Center - please note that after April 8th, 2024, all labs will be drawn in the cancer center.  You do not have to check in or register with the main entrance as you have in the past but will complete your check-in in the cancer center.  Wear comfortable clothing and clothing appropriate for easy access to any Portacath or PICC line.   We strive to give you quality time with your provider. You may need to reschedule your appointment if you arrive late (15 or more minutes).  Arriving late affects you and other patients whose appointments are after yours.  Also, if you miss three or more appointments without notifying the office, you may be dismissed from the clinic at the provider's discretion.      For prescription refill requests, have your pharmacy contact our office and allow 72 hours for refills to be completed.    Today you received the following pegfilgrastim return as scheduled.   To help prevent nausea and vomiting after your treatment, we encourage you to take your nausea medication as directed.  BELOW ARE SYMPTOMS THAT SHOULD BE REPORTED IMMEDIATELY: *FEVER GREATER THAN 100.4 F (38 C) OR HIGHER *CHILLS OR SWEATING *NAUSEA AND VOMITING THAT IS NOT CONTROLLED WITH YOUR NAUSEA MEDICATION *UNUSUAL SHORTNESS OF BREATH *UNUSUAL BRUISING OR BLEEDING *URINARY PROBLEMS (pain or burning when urinating, or frequent urination) *BOWEL PROBLEMS (unusual diarrhea, constipation, pain near the anus) TENDERNESS IN MOUTH AND THROAT WITH OR WITHOUT PRESENCE OF ULCERS (sore throat, sores in mouth, or a toothache) UNUSUAL RASH, SWELLING OR PAIN  UNUSUAL VAGINAL DISCHARGE OR ITCHING   Items with * indicate a potential emergency and should be followed up as soon as possible or go to the Emergency Department  if any problems should occur.  Please show the CHEMOTHERAPY ALERT CARD or IMMUNOTHERAPY ALERT CARD at check-in to the Emergency Department and triage nurse.  Should you have questions after your visit or need to cancel or reschedule your appointment, please contact [facility] [phone]  and follow the prompts.  Office hours are 8:00 a.m. to 4:30 p.m. Monday - Friday. Please note that voicemails left after 4:00 p.m. may not be returned until the following business day.  We are closed weekends and major holidays. You have access to a nurse at all times for urgent questions. Please call the main number to the clinic [phone] and follow the prompts.  For any non-urgent questions, you may also contact your provider using MyChart. We now offer e-Visits for anyone 2 and older to request care online for non-urgent symptoms. For details visit [email or web address].   Also download the MyChart app! Go to the app store, search "MyChart", open the app, select [place], and log in with your MyChart username and password.

## 2022-10-27 ENCOUNTER — Inpatient Hospital Stay: Payer: Managed Care, Other (non HMO)

## 2022-10-30 ENCOUNTER — Ambulatory Visit (INDEPENDENT_AMBULATORY_CARE_PROVIDER_SITE_OTHER): Payer: Managed Care, Other (non HMO) | Admitting: Internal Medicine

## 2022-10-30 ENCOUNTER — Encounter: Payer: Self-pay | Admitting: Internal Medicine

## 2022-10-30 VITALS — BP 116/76 | HR 97 | Ht 67.0 in | Wt 273.2 lb

## 2022-10-30 DIAGNOSIS — F3341 Major depressive disorder, recurrent, in partial remission: Secondary | ICD-10-CM | POA: Diagnosis not present

## 2022-10-30 DIAGNOSIS — Z170 Estrogen receptor positive status [ER+]: Secondary | ICD-10-CM

## 2022-10-30 DIAGNOSIS — E559 Vitamin D deficiency, unspecified: Secondary | ICD-10-CM

## 2022-10-30 DIAGNOSIS — K760 Fatty (change of) liver, not elsewhere classified: Secondary | ICD-10-CM

## 2022-10-30 DIAGNOSIS — E782 Mixed hyperlipidemia: Secondary | ICD-10-CM

## 2022-10-30 DIAGNOSIS — C50212 Malignant neoplasm of upper-inner quadrant of left female breast: Secondary | ICD-10-CM | POA: Diagnosis not present

## 2022-10-30 NOTE — Progress Notes (Signed)
Established Patient Office Visit  Subjective   Patient ID: Diana Jensen, female    DOB: April 06, 1976  Age: 47 y.o. MRN: [id number]  Chief Complaint  Patient presents with   Hyperlipidemia    Follow up   Diana Jensen returns to care today for 55-month follow-up.  She was last evaluated by me on 2/5.  No medication changes were made at that time.  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  She then underwent left mastectomy with SLNB on 3/4.  Postoperative course complicated by skin flap necrosis and hematoma formation.  She returned to the OR on 4/11 for Port-A-Cath placement and hematoma evacuation.  Diana Jensen has also started chemotherapy, completing cycle 1 of doxorubicin/Cytoxan on 5/1.  She reports feeling fairly well today.  Her acute concern is wishing to review recent PET scan results.  She endorses fatigue and seasonal allergies but is otherwise asymptomatic and has no acute concerns to discuss.  Past Medical History:  Diagnosis Date   Allergy    seasonal   Antiphospholipid syndrome (HCC)    Anxiety    Arthritis    deterioration of spine, knees   Carpal tunnel syndrome of right wrist    Clotting disorder (HCC)    antiphospholipid syndrome   Depression    Encounter for general adult medical examination with abnormal findings 07/03/2022   Essential hypertension, benign 02/10/2020   Hyperlipidemia 08/02/2022   Sleep disorder 02/22/2016   Past Surgical History:  Procedure Laterality Date   BREAST BIOPSY Left 08/10/2022   [place] LT BREAST BX W LOC DEV 1ST LESION IMG BX SPEC US GUIDE 08/10/2022 [facility]   BREAST BIOPSY Left 08/18/2022   MM LT BREAST BX W LOC DEV EA AD LESION IMG BX SPEC STEREO GUIDE 08/18/2022 [facility]   BREAST BIOPSY Left 08/18/2022   MM LT BREAST BX W LOC DEV 1ST LESION IMAGE BX SPEC STEREO GUIDE 08/18/2022 [facility]   CARPAL TUNNEL RELEASE Right 01/09/2019    Procedure: RIGHT CARPAL TUNNEL RELEASE;  Surgeon: Vickki Hearing, MD;  Location: [facility];  Service: Orthopedics;  Laterality: Right;   DILATION AND CURETTAGE OF UTERUS     x2   EYE SURGERY     lazy eye   IRRIGATION AND DEBRIDEMENT HEMATOMA Left 10/05/2022   Procedure: HEMATOMA EVACUATION OF LEFT BREAST WITH JP DRAIN PLACEMENT;  Surgeon: [name], MD;  Location: [facility];  Service: General;  Laterality: Left;   PARTIAL HYSTERECTOMY  2011   PORTACATH PLACEMENT Right 10/05/2022   Procedure: INSERTION PORT-A-CATH;  Surgeon: [name], MD;  Location: [facility];  Service: General;  Laterality: Right;   SIMPLE MASTECTOMY WITH AXILLARY SENTINEL NODE BIOPSY Left 08/28/2022   Procedure: SIMPLE MASTECTOMY WITH AXILLARY SENTINEL NODE BIOPSY;  Surgeon: [name], MD;  Location: [facility];  Service: General;  Laterality: Left;   Social History   Tobacco Use   Smoking status: Every Day    Packs/day: 0.50    Years: 28.00    Additional pack years: 0.00    Total pack years: 14.00    Types: Cigarettes   Smokeless tobacco: Never   Tobacco comments:    She has been smoking since age 61, smokes half a pack daily,was doing one pack a day until last year   Vaping Use   Vaping Use: Never used  Substance Use Topics   Alcohol use: No  Drug use: No   Family History  Problem Relation Age of Onset   Cancer Mother 74       breast    Arthritis Mother    Hypertension Mother    Osteoporosis Mother    Thyroid disease Father    Hypertension Father    Skin cancer Father    Mental illness Sister        depression ? bipolar   Dementia Maternal Grandmother    Cancer Maternal Grandfather        prostate   Heart disease Paternal Grandmother    Cancer Paternal Grandfather        colon   Colon cancer Paternal Grandfather    Mental illness Daughter    Pancreatic cancer Cousin        maternal cousin   Cancer Cousin        paternal, unknown type   Cancer Cousin        paternal,  unknown type   Cancer - Lung Neg Hx    Cervical cancer Neg Hx    No Known Allergies  Review of Systems  Constitutional:  Positive for malaise/fatigue. Negative for chills and fever.  HENT:  Negative for sore throat.   Respiratory:  Negative for cough and shortness of breath.   Cardiovascular:  Negative for chest pain, palpitations and leg swelling.  Gastrointestinal:  Negative for abdominal pain, blood in stool, constipation, diarrhea, nausea and vomiting.  Genitourinary:  Negative for dysuria and hematuria.  Musculoskeletal:  Negative for myalgias.  Skin:  Negative for itching and rash.  Neurological:  Negative for dizziness and headaches.  Psychiatric/Behavioral:  Negative for depression and suicidal ideas.      Objective:     BP 116/76   Pulse 97   Ht 5\' 7"  (1.702 m)   Wt 273 lb 3.2 oz (123.9 kg)   SpO2 95%   BMI 42.79 kg/m  BP Readings from Last 3 Encounters:  10/30/22 116/76  10/26/22 137/85  10/25/22 (!) 147/96   Physical Exam Vitals reviewed.  Constitutional:      General: She is not in acute distress.    Appearance: Normal appearance. She is obese. She is not toxic-appearing.  HENT:     Head: Normocephalic and atraumatic.     Right Ear: External ear normal.     Left Ear: External ear normal.     Nose: Nose normal. No congestion or rhinorrhea.     Mouth/Throat:     Mouth: Mucous membranes are moist.     Pharynx: Oropharynx is clear. No oropharyngeal exudate or posterior oropharyngeal erythema.  Eyes:     General: No scleral icterus.    Extraocular Movements: Extraocular movements intact.     Conjunctiva/sclera: Conjunctivae normal.     Pupils: Pupils are equal, round, and reactive to light.  Cardiovascular:     Rate and Rhythm: Normal rate and regular rhythm.     Pulses: Normal pulses.     Heart sounds: Normal heart sounds. No murmur heard.    No friction rub. No gallop.  Pulmonary:     Effort: Pulmonary effort is normal.     Breath sounds: Normal  breath sounds. No wheezing, rhonchi or rales.  Abdominal:     General: Abdomen is flat. Bowel sounds are normal. There is no distension.     Palpations: Abdomen is soft.     Tenderness: There is no abdominal tenderness.  Musculoskeletal:        General: No swelling. Normal  range of motion.     Cervical back: Normal range of motion.     Right lower leg: No edema.     Left lower leg: No edema.  Lymphadenopathy:     Cervical: No cervical adenopathy.  Skin:    General: Skin is warm and dry.     Capillary Refill: Capillary refill takes less than 2 seconds.     Coloration: Skin is not jaundiced.  Neurological:     General: No focal deficit present.     Mental Status: She is alert and oriented to person, place, and time.  Psychiatric:        Mood and Affect: Mood normal.        Behavior: Behavior normal.   Last CBC Lab Results  Component Value Date   WBC 7.3 10/25/2022   HGB 12.8 10/25/2022   HCT 40.2 10/25/2022   MCV 84.5 10/25/2022   MCH 26.9 10/25/2022   RDW 13.7 10/25/2022   PLT 271 10/25/2022   Last metabolic panel Lab Results  Component Value Date   GLUCOSE 178 (H) 10/25/2022   NA 138 10/25/2022   K 3.5 10/25/2022   CL 105 10/25/2022   CO2 22 10/25/2022   BUN 15 10/25/2022   CREATININE 0.74 10/25/2022   GFRNONAA >60 10/25/2022   CALCIUM 9.0 10/25/2022   PROT 7.1 10/25/2022   ALBUMIN 3.8 10/25/2022   LABGLOB 2.4 07/03/2022   AGRATIO 1.9 07/03/2022   BILITOT 0.6 10/25/2022   ALKPHOS 89 10/25/2022   AST 49 (H) 10/25/2022   ALT 79 (H) 10/25/2022   ANIONGAP 11 10/25/2022   Last lipids Lab Results  Component Value Date   CHOL 254 (H) 07/03/2022   HDL 33 (L) 07/03/2022   LDLCALC 163 (H) 07/03/2022   TRIG 308 (H) 07/03/2022   CHOLHDL 7.7 (H) 07/03/2022   Last hemoglobin A1c Lab Results  Component Value Date   HGBA1C 6.0 (H) 07/03/2022   Last thyroid functions Lab Results  Component Value Date   TSH 1.410 07/03/2022   Last vitamin D Lab Results   Component Value Date   VD25OH 15.4 (L) 07/03/2022   Last vitamin B12 and Folate Lab Results  Component Value Date   VITAMINB12 281 07/03/2022   FOLATE 6.2 07/03/2022   The 10-year ASCVD risk score (Arnett DK, et al., 2019) is: 10.1%    Assessment & Plan:   Problem List Items Addressed This Visit       Hepatic steatosis    Suggested on recent imaging.  We reviewed what this means today and that the best treatment is weight loss and improved control of additional chronic medical conditions.  She plans to focus on lifestyle modifications over the next few months. -Reassess at follow-up in 3 months -GLP-1 therapy discussed, however do not feel that timing is appropriate given that she just started chemotherapy.      Depression, major, recurrent, in partial remission (HCC)    Mood is currently stable on Lexapro 10 mg daily.  She is also followed by counseling in [place]. -No medication changes today      Vitamin D deficiency - Primary    She has completed high-dose, weekly vitamin D supplementation. -Repeat vitamin D level ordered today      Hyperlipidemia    Lipid panel last updated in January.  Total cholesterol 254 and LDL 163.  She has preferred to focus on lifestyle modifications aimed at improving her cholesterol before starting statin therapy. 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  S/p left mastectomy and SLNB on 3/4.  Chemotherapy (doxorubicin/Cytoxan) started 5/1.       Return in about 3 months (around 01/30/2023).    [name], MD

## 2022-10-30 NOTE — Patient Instructions (Signed)
It was a pleasure to see you today.  Thank you for giving [place] the opportunity to be involved in your care.  Below is a brief recap of your visit and next steps.  We will plan to see you again in 3 months.  Summary No medication changes today. Repeat vitamin D level on 5/14. Follow up in 3 months

## 2022-10-30 NOTE — Assessment & Plan Note (Signed)
She has completed high-dose, weekly vitamin D supplementation. -Repeat vitamin D level ordered today

## 2022-10-30 NOTE — Assessment & Plan Note (Signed)
Lipid panel last updated in January.  Total cholesterol 254 and LDL 163.  She has preferred to focus on lifestyle modifications aimed at improving her cholesterol before starting statin therapy. -Repeat lipid panel in 6 months

## 2022-10-30 NOTE — Assessment & Plan Note (Signed)
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  S/p left mastectomy and SLNB on 3/4.  Chemotherapy (doxorubicin/Cytoxan) started 5/1.

## 2022-10-30 NOTE — Assessment & Plan Note (Signed)
Suggested on recent imaging.  We reviewed what this means today and that the best treatment is weight loss and improved control of additional chronic medical conditions.  She plans to focus on lifestyle modifications over the next few months. -Reassess at follow-up in 3 months -GLP-1 therapy discussed, however do not feel that timing is appropriate given that she just started chemotherapy.

## 2022-10-30 NOTE — Assessment & Plan Note (Signed)
Mood is currently stable on Lexapro 10 mg daily.  She is also followed by counseling in [place]. -No medication changes today

## 2022-10-31 ENCOUNTER — Ambulatory Visit: Payer: Managed Care, Other (non HMO) | Admitting: Hematology

## 2022-10-31 ENCOUNTER — Other Ambulatory Visit: Payer: Self-pay

## 2022-10-31 ENCOUNTER — Other Ambulatory Visit: Payer: Managed Care, Other (non HMO)

## 2022-10-31 ENCOUNTER — Ambulatory Visit: Payer: Managed Care, Other (non HMO)

## 2022-11-02 ENCOUNTER — Ambulatory Visit: Payer: Managed Care, Other (non HMO)

## 2022-11-06 NOTE — Progress Notes (Signed)
[facility] [address] [place] [address]    Clinic Day:  11/07/2022  Referring physician: [name], MD  Patient Care Team: Diana Lade, MD as PCP - General (Internal Medicine) Diana Massed, MD as Consulting Physician (Hematology)   ASSESSMENT & PLAN:   Assessment: 1.  T1cN1 G2 left breast IDC, ER/PR positive, HER2 negative: - She felt a lump in her breast in December 2023. - Left breast subareolar mass at 9:00 biopsy (08/10/2022): grade 2 IDC, ER 95% strong intensity, PR 90% strong intensity, Ki-67 10%, HER2 1+ - Left mastectomy and SLNB (08/28/2022): 1.6 x 1.2 x 0.7 cm IDC, margins negative, LVI present, 1/1 lymph node with metastatic carcinoma, metastasis 15 mm, focal extracapsular extension.  PT1 cpN1a. 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  No prior history of arterial/venous thrombosis/strokes. - PET scan (10/19/2022): No evidence of hypermetabolic metastatic disease.  Diffuse hepatic steatosis. - Adjuvant AC started on 10/25/2022.   2.  Social/family history: - Seen today with her husband and daughter.  She worked as a Conservation officer, nature.  Current active smoker, 5 to 7 cigarettes/day, started at age 34. - Mother had breast cancer at age 25.  Father had skin cancer.  Paternal grandfather had colon cancer.  Maternal grandfather had prostate cancer.  1 maternal cousin had pancreatic cancer.  2 paternal cousins have cancer.    Plan: 1.  T1cN1 G2 left breast IDC, ER/PR positive, HER2 negative: - Cycle 1 of AC on 10/25/2022. - She felt tired for 2 to 3 days after G-CSF injection.  She had occasional nausea but denied any vomiting.  She had metallic taste at the time of infusion of Adriamycin. - Labs today: LFTs stable.  Creatinine normal.  CBC grossly normal. - She will receive her cycle 2 on 11/09/2022.  She will come back in 2 weeks for follow-up.   2.  Elevated LFTs: - AST and ALT are elevated at 50 and 63 respectively. - Most  likely from fatty liver.  Will closely monitor.  3.  High risk drug monitoring: - 2D echo on 10/23/2022: EF 55-60%.    Orders Placed This Encounter  Procedures   Magnesium    Standing Status:   Future    Standing Expiration Date:   12/21/2023   CBC with Differential    Standing Status:   Future    Standing Expiration Date:   12/21/2023   Comprehensive metabolic panel    Standing Status:   Future    Standing Expiration Date:   12/21/2023   Magnesium    Standing Status:   Future    Standing Expiration Date:   12/28/2023   CBC with Differential    Standing Status:   Future    Standing Expiration Date:   12/28/2023   Comprehensive metabolic panel    Standing Status:   Future    Standing Expiration Date:   12/28/2023   Magnesium    Standing Status:   Future    Standing Expiration Date:   01/04/2024   CBC with Differential    Standing Status:   Future    Standing Expiration Date:   01/04/2024   Comprehensive metabolic panel    Standing Status:   Future    Standing Expiration Date:   01/04/2024   Magnesium    Standing Status:   Future    Standing Expiration Date:   01/11/2024   CBC with Differential    Standing Status:  Future    Standing Expiration Date:   01/11/2024   Comprehensive metabolic panel    Standing Status:   Future    Standing Expiration Date:   01/11/2024   Magnesium    Standing Status:   Future    Standing Expiration Date:   01/18/2024   CBC with Differential    Standing Status:   Future    Standing Expiration Date:   01/18/2024   Comprehensive metabolic panel    Standing Status:   Future    Standing Expiration Date:   01/18/2024   Magnesium    Standing Status:   Future    Standing Expiration Date:   01/25/2024   CBC with Differential    Standing Status:   Future    Standing Expiration Date:   01/25/2024   Comprehensive metabolic panel    Standing Status:   Future    Standing Expiration Date:   01/25/2024      I,Diana Jensen,acting as a scribe for Diana Massed, MD.,have documented all relevant documentation on the behalf of Diana Massed, MD,as directed by  Diana Massed, MD while in the presence of Diana Massed, MD.   I, Diana Massed MD, have reviewed the above documentation for accuracy and completeness, and I agree with the above.   Diana Massed, MD   5/14/20244:58 PM  CHIEF COMPLAINT:   Diagnosis: left breast cancer    Cancer Staging  Breast cancer of upper-inner quadrant of left female breast [facility]) Staging form: Breast, AJCC 8th Edition - Clinical stage from 10/01/2022: Stage IB (cT1c, cN1, cM0, G2, ER+, PR+, HER2-) - Signed by Diana Massed, MD on 10/01/2022    Prior Therapy: left mastectomy and +SLNB with Dr. Algis Jensen on 08/28/22   Current Therapy:   Adjuvant chemotherapy with dose dense AC followed by weekly paclitaxel    HISTORY OF PRESENT ILLNESS:   Oncology History  Breast cancer of upper-inner quadrant of left female breast (HCC)  10/01/2022 Initial Diagnosis   Breast cancer of upper-inner quadrant of left female breast   10/01/2022 Cancer Staging   Staging form: Breast, AJCC 8th Edition - Clinical stage from 10/01/2022: Stage IB (cT1c, cN1, cM0, G2, ER+, PR+, HER2-) - Signed by Diana Massed, MD on 10/01/2022 Histopathologic type: Infiltrating duct carcinoma, NOS Stage prefix: Initial diagnosis Nuclear grade: G2 Histologic grading system: 3 grade system   10/25/2022 -  Chemotherapy   Patient is on Treatment Plan : BREAST ADJUVANT DOSE DENSE AC q14d / PACLitaxel q7d        INTERVAL HISTORY:   Diana Jensen is a 47 y.o. female presenting to clinic today for follow up of left breast cancer. She was last seen by me on 10/25/22.  Today, she states that she is doing well overall. Her appetite level is at 100%. Her energy level is at 75%.  PAST MEDICAL HISTORY:   Past Medical History: Past Medical History:  Diagnosis Date   Allergy    seasonal   Antiphospholipid syndrome  (HCC)    Anxiety    Arthritis    deterioration of spine, knees   Carpal tunnel syndrome of right wrist    Clotting disorder (HCC)    antiphospholipid syndrome   Depression    Encounter for general adult medical examination with abnormal findings 07/03/2022   Essential hypertension, benign 02/10/2020   Hyperlipidemia 08/02/2022   Sleep disorder 02/22/2016    Surgical History: Past Surgical History:  Procedure Laterality Date   BREAST BIOPSY Left 08/10/2022   [place]  LT BREAST BX W LOC DEV 1ST LESION IMG BX SPEC US GUIDE 08/10/2022 [facility]   BREAST BIOPSY Left 08/18/2022   MM LT BREAST BX W LOC DEV EA AD LESION IMG BX SPEC STEREO GUIDE 08/18/2022 [facility]   BREAST BIOPSY Left 08/18/2022   MM LT BREAST BX W LOC DEV 1ST LESION IMAGE BX SPEC STEREO GUIDE 08/18/2022 [facility]   CARPAL TUNNEL RELEASE Right 01/09/2019   Procedure: RIGHT CARPAL TUNNEL RELEASE;  Surgeon: Vickki Hearing, MD;  Location: [facility];  Service: Orthopedics;  Laterality: Right;   DILATION AND CURETTAGE OF UTERUS     x2   EYE SURGERY     lazy eye   IRRIGATION AND DEBRIDEMENT HEMATOMA Left 10/05/2022   Procedure: HEMATOMA EVACUATION OF LEFT BREAST WITH JP DRAIN PLACEMENT;  Surgeon: [name], MD;  Location: [facility];  Service: General;  Laterality: Left;   PARTIAL HYSTERECTOMY  2011   PORTACATH PLACEMENT Right 10/05/2022   Procedure: INSERTION PORT-A-CATH;  Surgeon: [name], MD;  Location: [facility];  Service: General;  Laterality: Right;   SIMPLE MASTECTOMY WITH AXILLARY SENTINEL NODE BIOPSY Left 08/28/2022   Procedure: SIMPLE MASTECTOMY WITH AXILLARY SENTINEL NODE BIOPSY;  Surgeon: [name], MD;  Location: [facility];  Service: General;  Laterality: Left;    Social History: Social History   Socioeconomic History   Marital status: Married    Spouse name: Not on file   Number of children: 2   Years of education: Not on file   Highest education level: Not on file   Occupational History   Not on file  Tobacco Use   Smoking status: Every Day    Packs/day: 0.50    Years: 28.00    Additional pack years: 0.00    Total pack years: 14.00    Types: Cigarettes   Smokeless tobacco: Never   Tobacco comments:    She has been smoking since age 89, smokes half a pack daily,was doing one pack a day until last year   Vaping Use   Vaping Use: Never used  Substance and Sexual Activity   Alcohol use: No   Drug use: No   Sexual activity: Yes    Birth control/protection: Surgical  Other Topics Concern   Not on file  Social History Narrative   Associates degree in Engineer, site. 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 Has 2 children 1 son and 1 daughter. Married x 15 years.   Social Determinants of Health   Financial Resource Strain: Not on file  Food Insecurity: No Food Insecurity (08/30/2022)   Hunger Vital Sign    Worried About Running Out of Food in the Last Year: Never true    Ran Out of Food in the Last Year: Never true  Transportation Needs: No Transportation Needs (08/30/2022)   PRAPARE - Administrator, Civil Service (Medical): No    Lack of Transportation (Non-Medical): No  Physical Activity: Not on file  Stress: Not on file  Social Connections: Not on file  Intimate Partner Violence: Not At Risk (08/30/2022)   Humiliation, Afraid, Rape, and Kick questionnaire    Fear of Current or Ex-Partner: No    Emotionally Abused: No    Physically Abused: No    Sexually Abused: No    Family History: Family History  Problem Relation Age of Onset   Cancer Mother 29       breast    Arthritis Mother  Hypertension Mother    Osteoporosis Mother    Thyroid disease Father    Hypertension Father    Skin cancer Father    Mental illness Sister        depression ? bipolar   Dementia Maternal Grandmother    Cancer Maternal Grandfather        prostate   Heart disease Paternal Grandmother    Cancer Paternal Grandfather         colon   Colon cancer Paternal Grandfather    Mental illness Daughter    Pancreatic cancer Cousin        maternal cousin   Cancer Cousin        paternal, unknown type   Cancer Cousin        paternal, unknown type   Cancer - Lung Neg Hx    Cervical cancer Neg Hx     Current Medications:  Current Outpatient Medications:    acetaminophen (TYLENOL) 500 MG tablet, Take 1,000 mg by mouth every 6 (six) hours as needed for headache., Disp: , Rfl:    cycloPHOSphamide (CYTOXAN IJ), Inject as directed., Disp: , Rfl:    DOXOrubicin HCl (ADRIAMYCIN IV), Inject into the vein., Disp: , Rfl:    escitalopram (LEXAPRO) 10 MG tablet, Take 1 tablet (10 mg total) by mouth daily., Disp: 90 tablet, Rfl: 0   hydrOXYzine (VISTARIL) 50 MG capsule, Take 1 capsule (50 mg total) by mouth daily as needed for anxiety., Disp: 30 capsule, Rfl: 2   lidocaine-prilocaine (EMLA) cream, Apply to affected area once, Disp: 30 g, Rfl: 3   loratadine (CLARITIN) 10 MG tablet, Take 10 mg by mouth daily., Disp: , Rfl:    losartan (COZAAR) 25 MG tablet, Take 1 tablet (25 mg total) by mouth daily., Disp: 90 tablet, Rfl: 1   ondansetron (ZOFRAN-ODT) 4 MG disintegrating tablet, Take 1 tablet (4 mg total) by mouth every 6 (six) hours as needed for nausea., Disp: 20 tablet, Rfl: 0   oxyCODONE (OXY IR/ROXICODONE) 5 MG immediate release tablet, Take 1-2 tablets (5-10 mg total) by mouth every 4 (four) hours as needed for severe pain or breakthrough pain., Disp: 15 tablet, Rfl: 0   pegfilgrastim-cbqv (UDENYCA) 6 MG/0.6ML injection, Administer 6 mg (0.6 ml) subcutaneously every 14 days on Day 3 of each treatment cycle - to be administered by a Designer, jewellery, Disp: 0.6 mL, Rfl: 3   prochlorperazine (COMPAZINE) 10 MG tablet, Take 1 tablet (10 mg total) by mouth every 6 (six) hours as needed for nausea or vomiting., Disp: 30 tablet, Rfl: 1   Allergies: No Known Allergies  REVIEW OF SYSTEMS:   Review of Systems  Constitutional:   Negative for chills, fatigue and fever.  HENT:   Negative for lump/mass, mouth sores, nosebleeds, sore throat and trouble swallowing.   Eyes:  Negative for eye problems.  Respiratory:  Negative for cough and shortness of breath.   Cardiovascular:  Negative for chest pain, leg swelling and palpitations.  Gastrointestinal:  Positive for nausea. Negative for abdominal pain, constipation, diarrhea and vomiting.  Genitourinary:  Negative for bladder incontinence, difficulty urinating, dysuria, frequency, hematuria and nocturia.   Musculoskeletal:  Negative for arthralgias, back pain, flank pain, myalgias and neck pain.  Skin:  Negative for itching and rash.  Neurological:  Positive for dizziness. Negative for headaches and numbness.  Hematological:  Does not bruise/bleed easily.  Psychiatric/Behavioral:  Negative for depression, sleep disturbance and suicidal ideas. The patient is not nervous/anxious.   All other systems reviewed  and are negative.    VITALS:   There were no vitals taken for this visit.  Wt Readings from Last 3 Encounters:  11/07/22 272 lb 3.2 oz (123.5 kg)  11/07/22 272 lb (123.4 kg)  10/30/22 273 lb 3.2 oz (123.9 kg)    There is no height or weight on file to calculate BMI.  Performance status (ECOG): 1 - Symptomatic but completely ambulatory  PHYSICAL EXAM:   Physical Exam Vitals and nursing note reviewed. Exam conducted with a chaperone present.  Constitutional:      Appearance: Normal appearance.  Cardiovascular:     Rate and Rhythm: Normal rate and regular rhythm.     Pulses: Normal pulses.     Heart sounds: Normal heart sounds.  Pulmonary:     Effort: Pulmonary effort is normal.     Breath sounds: Normal breath sounds.  Abdominal:     Palpations: Abdomen is soft. There is no hepatomegaly, splenomegaly or mass.     Tenderness: There is no abdominal tenderness.  Musculoskeletal:     Right lower leg: No edema.     Left lower leg: No edema.   Lymphadenopathy:     Cervical: No cervical adenopathy.     Right cervical: No superficial, deep or posterior cervical adenopathy.    Left cervical: No superficial, deep or posterior cervical adenopathy.     Upper Body:     Right upper body: No supraclavicular or axillary adenopathy.     Left upper body: No supraclavicular or axillary adenopathy.  Neurological:     General: No focal deficit present.     Mental Status: She is alert and oriented to person, place, and time.  Psychiatric:        Mood and Affect: Mood normal.        Behavior: Behavior normal.     LABS:      Latest Ref Rng & Units 11/07/2022    1:14 PM 10/25/2022    8:50 AM 08/30/2022    4:50 AM  CBC  WBC 4.0 - 10.5 K/uL 15.2  7.3  11.1   Hemoglobin 12.0 - 15.0 g/dL 16.1  09.6  04.5   Hematocrit 36.0 - 46.0 % 39.6  40.2  33.3   Platelets 150 - 400 K/uL 200  271  305       Latest Ref Rng & Units 11/07/2022    1:14 PM 10/25/2022    8:50 AM 08/30/2022    4:50 AM  CMP  Glucose 70 - 99 mg/dL 409  811  914   BUN 6 - 20 mg/dL 12  15  14    Creatinine 0.44 - 1.00 mg/dL 7.82  9.56  2.13   Sodium 135 - 145 mmol/L 139  138  140   Potassium 3.5 - 5.1 mmol/L 3.4  3.5  3.9   Chloride 98 - 111 mmol/L 104  105  107   CO2 22 - 32 mmol/L 25  22  24    Calcium 8.9 - 10.3 mg/dL 8.5  9.0  8.4   Total Protein 6.5 - 8.1 g/dL 7.1  7.1    Total Bilirubin 0.3 - 1.2 mg/dL 0.3  0.6    Alkaline Phos 38 - 126 U/L 99  89    AST 15 - 41 U/L 50  49    ALT 0 - 44 U/L 63  79       No results found for: "CEA1", "CEA" / No results found for: "CEA1", "CEA" No results found for: "  PSA1" No results found for: "OZH086" No results found for: "CAN125"  No results found for: "TOTALPROTELP", "ALBUMINELP", "A1GS", "A2GS", "BETS", "BETA2SER", "GAMS", "MSPIKE", "SPEI" No results found for: "TIBC", "FERRITIN", "IRONPCTSAT" No results found for: "LDH"   STUDIES:   ECHOCARDIOGRAM COMPLETE  Result Date: 10/23/2022    ECHOCARDIOGRAM REPORT   Patient Name:    Diana Jensen Date of Exam: 10/23/2022 Medical Rec #:  [id number]     Height:       67.0 in Accession #:    [id number]    Weight:       268.0 lb Date of Birth:  [birth year]     BSA:          2.290 m Patient Age:    46 years      BP:           125/80 mmHg Patient Gender: F             HR:           55 bpm. Exam Location:  Outpatient Procedure: 2D Echo, 3D Echo, Cardiac Doppler, Color Doppler and Strain Analysis Indications:    Chemo  History:        Patient has no prior history of Echocardiogram examinations.                 Risk Factors:Current Smoker, Hypertension, Diabetes and                 Dyslipidemia. Family history of Breast cancer; Left breast                 cancer.  Sonographer:    Jeryl Columbia RDCS Referring Phys: [id number] [facility]  Sonographer Comments: Left mastectomy 10/05/22. IMPRESSIONS  1. Left ventricular ejection fraction, by estimation, is 55 to 60%. The left ventricle has normal function. The left ventricle has no regional wall motion abnormalities. Left ventricular diastolic parameters were normal. The average left ventricular global longitudinal strain is -17.5 %. The global longitudinal strain is mildly abnormal.  2. Right ventricular systolic function is normal. The right ventricular size is normal. Tricuspid regurgitation signal is inadequate for assessing PA pressure.  3. The mitral valve is grossly normal. Trivial mitral valve regurgitation.  4. The aortic valve was not well visualized. Aortic valve regurgitation is not visualized. No aortic stenosis is present.  5. The inferior vena cava is normal in size with <50% respiratory variability, suggesting right atrial pressure of 8 mmHg. Comparison(s): No prior Echocardiogram. FINDINGS  Left Ventricle: Left ventricular ejection fraction, by estimation, is 55 to 60%. The left ventricle has normal function. The left ventricle has no regional wall motion abnormalities. The average left ventricular global longitudinal strain is -17.5 %.  The global longitudinal strain is abnormal. 3D ejection fraction reviewed and evaluated as part of the interpretation. Alternate measurement of EF is felt to be most reflective of LV function. The left ventricular internal cavity size was normal in size.  There is no left ventricular hypertrophy. Left ventricular diastolic parameters were normal. Right Ventricle: The right ventricular size is normal. No increase in right ventricular wall thickness. Right ventricular systolic function is normal. Tricuspid regurgitation signal is inadequate for assessing PA pressure. Left Atrium: Left atrial size was normal in size. Right Atrium: Right atrial size was normal in size. Pericardium: There is no evidence of pericardial effusion. Mitral Valve: The mitral valve is grossly normal. Trivial mitral valve regurgitation. Tricuspid Valve: The tricuspid valve is normal in structure. Tricuspid valve  regurgitation is trivial. Aortic Valve: The aortic valve was not well visualized. Aortic valve regurgitation is not visualized. No aortic stenosis is present. Pulmonic Valve: The pulmonic valve was not well visualized. Aorta: The aortic root is normal in size and structure. Venous: The inferior vena cava is normal in size with less than 50% respiratory variability, suggesting right atrial pressure of 8 mmHg. IAS/Shunts: The atrial septum is grossly normal.  LEFT VENTRICLE PLAX 2D LVIDd:         3.92 cm      Diastology LVIDs:         2.89 cm      LV e' medial:    8.81 cm/s LV PW:         1.37 cm      LV E/e' medial:  8.4 LV IVS:        1.33 cm      LV e' lateral:   9.25 cm/s LVOT diam:     2.10 cm      LV E/e' lateral: 8.0 LV SV:         55 LV SV Index:   24           2D Longitudinal Strain LVOT Area:     3.46 cm     2D Strain GLS Avg:     -17.5 %  LV Volumes (MOD) LV vol d, MOD A2C: 131.0 ml LV vol d, MOD A4C: 134.0 ml LV vol s, MOD A2C: 64.7 ml LV vol s, MOD A4C: 56.9 ml LV SV MOD A2C:     66.3 ml LV SV MOD A4C:     134.0 ml LV SV MOD  BP:      73.3 ml RIGHT VENTRICLE RV Basal diam:  4.70 cm RV Mid diam:    3.55 cm RV S prime:     13.90 cm/s TAPSE (M-mode): 2.4 cm LEFT ATRIUM             Index        RIGHT ATRIUM           Index LA diam:        3.70 cm 1.62 cm/m   RA Area:     13.20 cm LA Vol (A2C):   61.7 ml 26.94 ml/m  RA Volume:   32.90 ml  14.37 ml/m LA Vol (A4C):   40.3 ml 17.60 ml/m LA Biplane Vol: 49.6 ml 21.66 ml/m  AORTIC VALVE LVOT Vmax:   80.70 cm/s LVOT Vmean:  52.400 cm/s LVOT VTI:    0.160 m  AORTA Ao Root diam: 3.00 cm MITRAL VALVE MV Area (PHT): 3.53 cm    SHUNTS MV Decel Time: 215 msec    Systemic VTI:  0.16 m MV E velocity: 73.90 cm/s  Systemic Diam: 2.10 cm MV A velocity: 67.60 cm/s MV E/A ratio:  1.09 Laurance Flatten MD Electronically signed by Laurance Flatten MD Signature Date/Time: 10/23/2022/2:30:36 PM    Final    NM PET Image Initial (PI) Skull Base To Thigh  Result Date: 10/22/2022 CLINICAL DATA:  Initial treatment strategy for breast cancer. EXAM: NUCLEAR MEDICINE PET SKULL BASE TO THIGH TECHNIQUE: 13.22 mCi F-18 FDG was injected intravenously. Full-ring PET imaging was performed from the skull base to thigh after the radiotracer. CT data was obtained and used for attenuation correction and anatomic localization. Fasting blood glucose: 108 mg/dl COMPARISON:  None Available. FINDINGS: Mediastinal blood pool activity: SUV max 2.5 Liver activity: SUV max NA NECK: No hypermetabolic lymph nodes  in the neck. Incidental CT findings: None. CHEST: Low-level FDG avidity throughout the anterior chest wall where there are 2 fluid collections the larger measuring 9.5 x 1.9 cm in the smaller measuring 19 x 11 mm, with the smaller collection demonstrates an internal focus of gas adjacent, there adjacent fluid stranding with some overlying skin thickening and max SUV of 5.72. No hypermetabolic thoracic adenopathy. No hypermetabolic pulmonary nodules or masses. Incidental CT findings: Surgical clips in the left axilla.  Motion degraded examination reveals no suspicious pulmonary nodules or masses. Right chest Port-A-Cath with tip at the superior cavoatrial junction. ABDOMEN/PELVIS: No abnormal hypermetabolic activity within the liver, pancreas, adrenal glands, or spleen. No hypermetabolic lymph nodes in the abdomen or pelvis. Incidental CT findings: Diffuse hepatic steatosis with focal fatty sparing along the gallbladder fossa. Aortic atherosclerosis. SKELETON: No focal hypermetabolic activity to suggest skeletal metastasis. Incidental CT findings: Bilateral L5 pars defects with grade 1 L5-S1 anterolisthesis. IMPRESSION: 1. Low-level FDG avidity throughout the anterior chest wall where there are 2 fluid collections the larger measuring 9.5 x 1.9 cm, with the smaller measuring 19 x 11 mm, with adjacent fluid stranding with some overlying skin thickening and max SUV 5.72. Findings are favored to reflect postsurgical change. Suggest attention on follow-up imaging. 2. No evidence of hypermetabolic metastatic disease. 3. Diffuse hepatic steatosis with focal fatty sparing along the gallbladder fossa. 4. Bilateral L5 pars defects with grade 1 L5-S1 anterolisthesis. 5.  Aortic Atherosclerosis (ICD10-I70.0). Electronically Signed   By: Maudry Mayhew M.D.   On: 10/22/2022 09:09

## 2022-11-07 ENCOUNTER — Ambulatory Visit (INDEPENDENT_AMBULATORY_CARE_PROVIDER_SITE_OTHER): Payer: Managed Care, Other (non HMO) | Admitting: General Surgery

## 2022-11-07 ENCOUNTER — Encounter: Payer: Self-pay | Admitting: General Surgery

## 2022-11-07 ENCOUNTER — Other Ambulatory Visit: Payer: Self-pay

## 2022-11-07 ENCOUNTER — Inpatient Hospital Stay (HOSPITAL_BASED_OUTPATIENT_CLINIC_OR_DEPARTMENT_OTHER): Payer: Managed Care, Other (non HMO) | Admitting: Hematology

## 2022-11-07 ENCOUNTER — Inpatient Hospital Stay: Payer: Managed Care, Other (non HMO)

## 2022-11-07 VITALS — BP 137/92 | HR 78 | Temp 98.4°F | Resp 16 | Ht 67.0 in | Wt 272.0 lb

## 2022-11-07 VITALS — BP 129/84 | HR 90 | Temp 97.4°F | Resp 20 | Wt 272.2 lb

## 2022-11-07 DIAGNOSIS — C50212 Malignant neoplasm of upper-inner quadrant of left female breast: Secondary | ICD-10-CM

## 2022-11-07 DIAGNOSIS — E559 Vitamin D deficiency, unspecified: Secondary | ICD-10-CM

## 2022-11-07 DIAGNOSIS — Z95828 Presence of other vascular implants and grafts: Secondary | ICD-10-CM

## 2022-11-07 DIAGNOSIS — C50112 Malignant neoplasm of central portion of left female breast: Secondary | ICD-10-CM

## 2022-11-07 DIAGNOSIS — Z170 Estrogen receptor positive status [ER+]: Secondary | ICD-10-CM | POA: Diagnosis not present

## 2022-11-07 DIAGNOSIS — T148XXA Other injury of unspecified body region, initial encounter: Secondary | ICD-10-CM

## 2022-11-07 DIAGNOSIS — Z5111 Encounter for antineoplastic chemotherapy: Secondary | ICD-10-CM | POA: Diagnosis not present

## 2022-11-07 LAB — CBC WITH DIFFERENTIAL/PLATELET
Abs Immature Granulocytes: 1.5 10*3/uL — ABNORMAL HIGH (ref 0.00–0.07)
Band Neutrophils: 1 %
Basophils Absolute: 0.2 10*3/uL — ABNORMAL HIGH (ref 0.0–0.1)
Basophils Relative: 1 %
Eosinophils Absolute: 0 10*3/uL (ref 0.0–0.5)
Eosinophils Relative: 0 %
HCT: 39.6 % (ref 36.0–46.0)
Hemoglobin: 12.8 g/dL (ref 12.0–15.0)
Lymphocytes Relative: 16 %
Lymphs Abs: 2.4 10*3/uL (ref 0.7–4.0)
MCH: 27.1 pg (ref 26.0–34.0)
MCHC: 32.3 g/dL (ref 30.0–36.0)
MCV: 83.9 fL (ref 80.0–100.0)
Metamyelocytes Relative: 8 %
Monocytes Absolute: 0.6 10*3/uL (ref 0.1–1.0)
Monocytes Relative: 4 %
Myelocytes: 2 %
Neutro Abs: 10.5 10*3/uL — ABNORMAL HIGH (ref 1.7–7.7)
Neutrophils Relative %: 68 %
Platelets: 200 10*3/uL (ref 150–400)
RBC: 4.72 MIL/uL (ref 3.87–5.11)
RDW: 14.2 % (ref 11.5–15.5)
WBC: 15.2 10*3/uL — ABNORMAL HIGH (ref 4.0–10.5)
nRBC: 0.2 % (ref 0.0–0.2)

## 2022-11-07 LAB — COMPREHENSIVE METABOLIC PANEL
ALT: 63 U/L — ABNORMAL HIGH (ref 0–44)
AST: 50 U/L — ABNORMAL HIGH (ref 15–41)
Albumin: 3.9 g/dL (ref 3.5–5.0)
Alkaline Phosphatase: 99 U/L (ref 38–126)
Anion gap: 10 (ref 5–15)
BUN: 12 mg/dL (ref 6–20)
CO2: 25 mmol/L (ref 22–32)
Calcium: 8.5 mg/dL — ABNORMAL LOW (ref 8.9–10.3)
Chloride: 104 mmol/L (ref 98–111)
Creatinine, Ser: 0.71 mg/dL (ref 0.44–1.00)
GFR, Estimated: >60 mL/min (ref >60)
Glucose, Bld: 131 mg/dL — ABNORMAL HIGH (ref 70–99)
Potassium: 3.4 mmol/L — ABNORMAL LOW (ref 3.5–5.1)
Sodium: 139 mmol/L (ref 135–145)
Total Bilirubin: 0.3 mg/dL (ref 0.3–1.2)
Total Protein: 7.1 g/dL (ref 6.5–8.1)

## 2022-11-07 LAB — MAGNESIUM: Magnesium: 1.9 mg/dL (ref 1.7–2.4)

## 2022-11-07 MED ORDER — HEPARIN SOD (PORK) LOCK FLUSH 100 UNIT/ML IV SOLN
500.0000 [IU] | Freq: Once | INTRAVENOUS | Status: AC
  Administered 2022-11-07: 500 [IU] via INTRAVENOUS

## 2022-11-07 MED ORDER — SODIUM CHLORIDE 0.9% FLUSH
10.0000 mL | Freq: Once | INTRAVENOUS | Status: AC
  Administered 2022-11-07: 10 mL via INTRAVENOUS

## 2022-11-07 NOTE — Progress Notes (Signed)
[facility]  No major issues. Started her chemotherapy and is going well.   BP (!) 137/92   Pulse 78   Temp 98.4 F (36.9 C) (Oral)   Resp 16   Ht 5\' 7"  (1.702 m)   Wt 272 lb (123.4 kg)   SpO2 94%   BMI 42.60 kg/m  Left mastectomy site healing. Some swelling still and swelling in the axilla.  Minor bruising still, the central portion of incision is indented inward from the edema and reexcision, some of this is from surround adipose tissue and some is from post op swelling   Patient s/p left mastectomy and SLNB. Healing but edematous and swollen still. This will continue to improve with new venous formation and with new lymphatic formation. Axilla swelling and upper left chest area swelling should get better too.   Over 3-6 months the swelling should continue to get better around the incision and in the axilla (armpit area).   Call with issues or concerns or if you want me to look at the area.   Algis Greenhouse, MD [facility] [address] [place], [place] [address] [phone] (office)

## 2022-11-07 NOTE — Patient Instructions (Signed)
[facility] at [facility] Discharge Instructions   You were seen and examined today by Dr. Ellin Saba.  He reviewed the results of your lab work which are normal/stable.   We will proceed with your treatment on Thursday.   Return as scheduled.    Thank you for choosing [facility] at [facility] to provide your oncology and hematology care.  To afford each patient quality time with our provider, please arrive at least 15 minutes before your scheduled appointment time.   If you have a lab appointment with the Cancer Center please come in thru the Main Entrance and check in at the main information desk.  You need to re-schedule your appointment should you arrive 10 or more minutes late.  We strive to give you quality time with our providers, and arriving late affects you and other patients whose appointments are after yours.  Also, if you no show three or more times for appointments you may be dismissed from the clinic at the providers discretion.     Again, thank you for choosing [facility].  Our hope is that these requests will decrease the amount of time that you wait before being seen by our physicians.       _____________________________________________________________  Should you have questions after your visit to [facility], please contact our office at [phone] and follow the prompts.  Our office hours are 8:00 a.m. and 4:30 p.m. Monday - Friday.  Please note that voicemails left after 4:00 p.m. may not be returned until the following business day.  We are closed weekends and major holidays.  You do have access to a nurse 24-7, just call the main number to the clinic [phone] and do not press any options, hold on the line and a nurse will answer the phone.    For prescription refill requests, have your pharmacy contact our office and allow 72 hours.    Due to Covid, you will need to wear a mask upon  entering the hospital. If you do not have a mask, a mask will be given to you at the Main Entrance upon arrival. For doctor visits, patients may have 1 support person age 62 or older with them. For treatment visits, patients can not have anyone with them due to social distancing guidelines and our immunocompromised population.

## 2022-11-07 NOTE — Progress Notes (Signed)
Patient has been examined by Dr. Ellin Saba. Vital signs and labs have been reviewed by MD - ANC, Creatinine, LFTs, hemoglobin, and platelets are within treatment parameters per M.D. - pt may proceed with treatment as scheduled on 11/09/2022.

## 2022-11-07 NOTE — Patient Instructions (Signed)
Over 3-6 months the swelling should continue to get better around the incision and in the axilla (armpit area). Call with issues or concerns or if you want me to look at the area.

## 2022-11-09 ENCOUNTER — Inpatient Hospital Stay: Payer: Managed Care, Other (non HMO) | Admitting: Licensed Clinical Social Worker

## 2022-11-09 ENCOUNTER — Inpatient Hospital Stay: Payer: Managed Care, Other (non HMO)

## 2022-11-09 VITALS — BP 128/82 | HR 82 | Temp 97.6°F | Resp 20 | Wt 268.3 lb

## 2022-11-09 DIAGNOSIS — C50212 Malignant neoplasm of upper-inner quadrant of left female breast: Secondary | ICD-10-CM

## 2022-11-09 DIAGNOSIS — Z5111 Encounter for antineoplastic chemotherapy: Secondary | ICD-10-CM | POA: Diagnosis not present

## 2022-11-09 MED ORDER — SODIUM CHLORIDE 0.9 % IV SOLN
150.0000 mg | Freq: Once | INTRAVENOUS | Status: AC
  Administered 2022-11-09: 150 mg via INTRAVENOUS
  Filled 2022-11-09: qty 150

## 2022-11-09 MED ORDER — PALONOSETRON HCL INJECTION 0.25 MG/5ML
0.2500 mg | Freq: Once | INTRAVENOUS | Status: AC
  Administered 2022-11-09: 0.25 mg via INTRAVENOUS
  Filled 2022-11-09: qty 5

## 2022-11-09 MED ORDER — SODIUM CHLORIDE 0.9 % IV SOLN: Freq: Once | INTRAVENOUS | Status: AC

## 2022-11-09 MED ORDER — SODIUM CHLORIDE 0.9 % IV SOLN
600.0000 mg/m2 | Freq: Once | INTRAVENOUS | Status: AC
  Administered 2022-11-09: 1500 mg via INTRAVENOUS
  Filled 2022-11-09: qty 75

## 2022-11-09 MED ORDER — SODIUM CHLORIDE 0.9% FLUSH
10.0000 mL | INTRAVENOUS | Status: DC | PRN
  Administered 2022-11-09: 10 mL

## 2022-11-09 MED ORDER — SODIUM CHLORIDE 0.9 % IV SOLN
10.0000 mg | Freq: Once | INTRAVENOUS | Status: AC
  Administered 2022-11-09: 10 mg via INTRAVENOUS
  Filled 2022-11-09: qty 10

## 2022-11-09 MED ORDER — DOXORUBICIN HCL CHEMO IV INJECTION 2 MG/ML
60.0000 mg/m2 | Freq: Once | INTRAVENOUS | Status: AC
  Administered 2022-11-09: 144 mg via INTRAVENOUS
  Filled 2022-11-09: qty 72

## 2022-11-09 MED ORDER — HEPARIN SOD (PORK) LOCK FLUSH 100 UNIT/ML IV SOLN
500.0000 [IU] | Freq: Once | INTRAVENOUS | Status: AC | PRN
  Administered 2022-11-09: 500 [IU]

## 2022-11-09 NOTE — Progress Notes (Signed)
[facility] Clinical Social Work  Initial Assessment   Diana Jensen is a 47 y.o. year old female accompanied by patient and spouse. Clinical Social Work was referred by medical provider for assessment of psychosocial needs.   SDOH (Social Determinants of Health) assessments performed: Yes SDOH Interventions    Flowsheet Row Office Visit from 10/30/2022 in [facility] Office Visit from 07/03/2022 in [facility] Office Visit from 09/15/2021 in [facility]  SDOH Interventions     Depression Interventions/Treatment  Currently on Treatment Medication Counseling       SDOH Screenings   Food Insecurity: No Food Insecurity (08/30/2022)  Housing: Low Risk  (08/30/2022)  Transportation Needs: No Transportation Needs (08/30/2022)  Utilities: Not At Risk (08/30/2022)  Depression (PHQ2-9): High Risk (10/30/2022)  Tobacco Use: High Risk (11/07/2022)     Distress Screen completed: No     No data to display            Family/Social Information:  Housing Arrangement: patient lives with her husband as well as their two children.  Pt's son is 82 and her daughter is 73. Family members/support persons in your life? Pt states both she and her spouse have family nearby and they also are very connected to their church community, all of which have offered to assist. 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  Prior to diagnosis she had started to put in applications for part time employment, but at this time is focused on treatment and continuing school to the capacity she is able.  Pt's spouse works at [employer].   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 yes Concerns about diagnosis and/or treatment: Feelings of anger or sadness, Body image, Overwhelmed by information, Afraid of cancer, and How I will pay for the services I need Patient reported stressors: Work/ school, Finances, Depression, Anxiety/ nervousness, and Adjusting to my illness Hopes and/or priorities: Pt's priority is to continue treatment w/ the hope of positive results Patient enjoys time with family/ friends Current coping skills/ strengths: Capable of independent living , Manufacturing systems engineer , Motivation for treatment/growth , Physical Health , and Supportive family/friends     SUMMARY: Current SDOH Barriers:  Financial constraints related to limited income and unexpected medical expenses  Clinical Social Work Clinical Goal(s):  Explore community resource options for unmet needs related to:  Financial Strain   Interventions: Discussed common feeling and emotions when being diagnosed with cancer, and the importance of support during treatment Informed patient of the support team roles and support services at [facility] Provided CSW contact information and encouraged patient to call with any questions or concerns Referred patient to Dance movement psychotherapist for [employer], provided both the [employer] and the [employer] in [place].  Pt to complete grants and will return to CSW for submission.  Sent a message to [place] w/ pt's request to join their breast cancer support group.  Submitted referral for a mentor.  Emailed resources for children.     Follow Up Plan: Patient will contact CSW with any support or  resource needs Patient verbalizes understanding of plan: Yes    Rachel Moulds, LCSW Clinical Social Worker [facility]

## 2022-11-09 NOTE — Patient Instructions (Signed)
[facility]  Discharge Instructions: Thank you for choosing [facility] to provide your oncology and hematology care.  If you have a lab appointment with the Cancer Center - please note that after April 8th, 2024, all labs will be drawn in the cancer center.  You do not have to check in or register with the main entrance as you have in the past but will complete your check-in in the cancer center.  Wear comfortable clothing and clothing appropriate for easy access to any Portacath or PICC line.   We strive to give you quality time with your provider. You may need to reschedule your appointment if you arrive late (15 or more minutes).  Arriving late affects you and other patients whose appointments are after yours.  Also, if you miss three or more appointments without notifying the office, you may be dismissed from the clinic at the provider's discretion.      For prescription refill requests, have your pharmacy contact our office and allow 72 hours for refills to be completed.    Today you received the following chemotherapy and/or immunotherapy agents Doxorubicin/Cytoxan      To help prevent nausea and vomiting after your treatment, we encourage you to take your nausea medication as directed.  BELOW ARE SYMPTOMS THAT SHOULD BE REPORTED IMMEDIATELY: *FEVER GREATER THAN 100.4 F (38 C) OR HIGHER *CHILLS OR SWEATING *NAUSEA AND VOMITING THAT IS NOT CONTROLLED WITH YOUR NAUSEA MEDICATION *UNUSUAL SHORTNESS OF BREATH *UNUSUAL BRUISING OR BLEEDING *URINARY PROBLEMS (pain or burning when urinating, or frequent urination) *BOWEL PROBLEMS (unusual diarrhea, constipation, pain near the anus) TENDERNESS IN MOUTH AND THROAT WITH OR WITHOUT PRESENCE OF ULCERS (sore throat, sores in mouth, or a toothache) UNUSUAL RASH, SWELLING OR PAIN  UNUSUAL VAGINAL DISCHARGE OR ITCHING   Items with * indicate a potential emergency and should be followed up as soon as possible or  go to the Emergency Department if any problems should occur.  Please show the CHEMOTHERAPY ALERT CARD or IMMUNOTHERAPY ALERT CARD at check-in to the Emergency Department and triage nurse.  Should you have questions after your visit or need to cancel or reschedule your appointment, please contact [facility] [phone]  and follow the prompts.  Office hours are 8:00 a.m. to 4:30 p.m. Monday - Friday. Please note that voicemails left after 4:00 p.m. may not be returned until the following business day.  We are closed weekends and major holidays. You have access to a nurse at all times for urgent questions. Please call the main number to the clinic [phone] and follow the prompts.  For any non-urgent questions, you may also contact your provider using MyChart. We now offer e-Visits for anyone 69 and older to request care online for non-urgent symptoms. For details visit [email or web address].   Also download the MyChart app! Go to the app store, search "MyChart", open the app, select [place], and log in with your MyChart username and password.

## 2022-11-09 NOTE — Progress Notes (Signed)
Patient presents today for Doxorubicyn/Cytoxan per providers order.  Vital signs and labs within parameters for treatment.  Patient has no new complaints at this time.

## 2022-11-10 ENCOUNTER — Inpatient Hospital Stay: Payer: Managed Care, Other (non HMO)

## 2022-11-10 ENCOUNTER — Other Ambulatory Visit (HOSPITAL_COMMUNITY): Payer: Managed Care, Other (non HMO)

## 2022-11-10 VITALS — BP 120/77 | HR 80 | Temp 97.3°F | Resp 18

## 2022-11-10 DIAGNOSIS — C50212 Malignant neoplasm of upper-inner quadrant of left female breast: Secondary | ICD-10-CM

## 2022-11-10 DIAGNOSIS — Z5111 Encounter for antineoplastic chemotherapy: Secondary | ICD-10-CM | POA: Diagnosis not present

## 2022-11-10 MED ORDER — PEGFILGRASTIM-CBQV 6 MG/0.6ML ~~LOC~~ SOSY
6.0000 mg | PREFILLED_SYRINGE | Freq: Once | SUBCUTANEOUS | Status: AC
  Administered 2022-11-10: 6 mg via SUBCUTANEOUS

## 2022-11-10 NOTE — Progress Notes (Signed)
Udenyca injection given per orders.Patient tolerated it well without problems. Vitals stable and discharged home from clinic ambulatory. Follow up as scheduled.  

## 2022-11-10 NOTE — Patient Instructions (Signed)
[facility]  Discharge Instructions: Thank you for choosing [facility] to provide your oncology and hematology care.  If you have a lab appointment with the Cancer Center - please note that after April 8th, 2024, all labs will be drawn in the cancer center.  You do not have to check in or register with the main entrance as you have in the past but will complete your check-in in the cancer center.  Wear comfortable clothing and clothing appropriate for easy access to any Portacath or PICC line.   We strive to give you quality time with your provider. You may need to reschedule your appointment if you arrive late (15 or more minutes).  Arriving late affects you and other patients whose appointments are after yours.  Also, if you miss three or more appointments without notifying the office, you may be dismissed from the clinic at the provider's discretion.      For prescription refill requests, have your pharmacy contact our office and allow 72 hours for refills to be completed.    Today you received udenyca   To help prevent nausea and vomiting after your treatment, we encourage you to take your nausea medication as directed.  BELOW ARE SYMPTOMS THAT SHOULD BE REPORTED IMMEDIATELY: *FEVER GREATER THAN 100.4 F (38 C) OR HIGHER *CHILLS OR SWEATING *NAUSEA AND VOMITING THAT IS NOT CONTROLLED WITH YOUR NAUSEA MEDICATION *UNUSUAL SHORTNESS OF BREATH *UNUSUAL BRUISING OR BLEEDING *URINARY PROBLEMS (pain or burning when urinating, or frequent urination) *BOWEL PROBLEMS (unusual diarrhea, constipation, pain near the anus) TENDERNESS IN MOUTH AND THROAT WITH OR WITHOUT PRESENCE OF ULCERS (sore throat, sores in mouth, or a toothache) UNUSUAL RASH, SWELLING OR PAIN  UNUSUAL VAGINAL DISCHARGE OR ITCHING   Items with * indicate a potential emergency and should be followed up as soon as possible or go to the Emergency Department if any problems should occur.  Please  show the CHEMOTHERAPY ALERT CARD or IMMUNOTHERAPY ALERT CARD at check-in to the Emergency Department and triage nurse.  Should you have questions after your visit or need to cancel or reschedule your appointment, please contact [facility] AT [facility] [phone]  and follow the prompts.  Office hours are 8:00 a.m. to 4:30 p.m. Monday - Friday. Please note that voicemails left after 4:00 p.m. may not be returned until the following business day.  We are closed weekends and major holidays. You have access to a nurse at all times for urgent questions. Please call the main number to the clinic [phone] and follow the prompts.  For any non-urgent questions, you may also contact your provider using MyChart. We now offer e-Visits for anyone 59 and older to request care online for non-urgent symptoms. For details visit [email or web address].   Also download the MyChart app! Go to the app store, search "MyChart", open the app, select [place], and log in with your MyChart username and password.

## 2022-11-14 ENCOUNTER — Ambulatory Visit: Payer: Managed Care, Other (non HMO)

## 2022-11-14 ENCOUNTER — Ambulatory Visit: Payer: Managed Care, Other (non HMO) | Admitting: Hematology

## 2022-11-14 ENCOUNTER — Other Ambulatory Visit: Payer: Managed Care, Other (non HMO)

## 2022-11-16 ENCOUNTER — Ambulatory Visit: Payer: Managed Care, Other (non HMO)

## 2022-11-23 ENCOUNTER — Telehealth: Payer: Self-pay | Admitting: Hematology

## 2022-11-23 NOTE — Telephone Encounter (Signed)
Received pts financial documents. Enrolled her into the [facility]

## 2022-11-27 ENCOUNTER — Ambulatory Visit: Payer: Managed Care, Other (non HMO) | Admitting: Hematology

## 2022-11-27 ENCOUNTER — Other Ambulatory Visit: Payer: Managed Care, Other (non HMO)

## 2022-11-27 ENCOUNTER — Inpatient Hospital Stay: Payer: Managed Care, Other (non HMO)

## 2022-11-27 ENCOUNTER — Inpatient Hospital Stay: Payer: Managed Care, Other (non HMO) | Attending: Hematology

## 2022-11-27 ENCOUNTER — Inpatient Hospital Stay (HOSPITAL_BASED_OUTPATIENT_CLINIC_OR_DEPARTMENT_OTHER): Payer: Managed Care, Other (non HMO) | Admitting: Hematology

## 2022-11-27 VITALS — BP 122/89 | HR 89 | Temp 97.7°F | Resp 18 | Ht 67.0 in | Wt 265.8 lb

## 2022-11-27 VITALS — BP 119/75 | HR 73 | Temp 96.9°F | Resp 20

## 2022-11-27 DIAGNOSIS — C50212 Malignant neoplasm of upper-inner quadrant of left female breast: Secondary | ICD-10-CM | POA: Diagnosis present

## 2022-11-27 DIAGNOSIS — Z95828 Presence of other vascular implants and grafts: Secondary | ICD-10-CM

## 2022-11-27 DIAGNOSIS — Z79899 Other long term (current) drug therapy: Secondary | ICD-10-CM | POA: Insufficient documentation

## 2022-11-27 DIAGNOSIS — Z170 Estrogen receptor positive status [ER+]: Secondary | ICD-10-CM

## 2022-11-27 DIAGNOSIS — Z5111 Encounter for antineoplastic chemotherapy: Secondary | ICD-10-CM | POA: Insufficient documentation

## 2022-11-27 DIAGNOSIS — E559 Vitamin D deficiency, unspecified: Secondary | ICD-10-CM

## 2022-11-27 LAB — COMPREHENSIVE METABOLIC PANEL
ALT: 88 U/L — ABNORMAL HIGH (ref 0–44)
AST: 41 U/L (ref 15–41)
Albumin: 4 g/dL (ref 3.5–5.0)
Alkaline Phosphatase: 91 U/L (ref 38–126)
Anion gap: 11 (ref 5–15)
BUN: 12 mg/dL (ref 6–20)
CO2: 21 mmol/L — ABNORMAL LOW (ref 22–32)
Calcium: 8.6 mg/dL — ABNORMAL LOW (ref 8.9–10.3)
Chloride: 104 mmol/L (ref 98–111)
Creatinine, Ser: 0.77 mg/dL (ref 0.44–1.00)
GFR, Estimated: >60 mL/min (ref >60)
Glucose, Bld: 156 mg/dL — ABNORMAL HIGH (ref 70–99)
Potassium: 3.6 mmol/L (ref 3.5–5.1)
Sodium: 136 mmol/L (ref 135–145)
Total Bilirubin: 0.5 mg/dL (ref 0.3–1.2)
Total Protein: 7 g/dL (ref 6.5–8.1)

## 2022-11-27 LAB — CBC WITH DIFFERENTIAL/PLATELET
Abs Immature Granulocytes: 0.43 10*3/uL — ABNORMAL HIGH (ref 0.00–0.07)
Basophils Absolute: 0.1 10*3/uL (ref 0.0–0.1)
Basophils Relative: 1 %
Eosinophils Absolute: 0.2 10*3/uL (ref 0.0–0.5)
Eosinophils Relative: 2 %
HCT: 39.8 % (ref 36.0–46.0)
Hemoglobin: 13 g/dL (ref 12.0–15.0)
Immature Granulocytes: 5 %
Lymphocytes Relative: 15 %
Lymphs Abs: 1.3 10*3/uL (ref 0.7–4.0)
MCH: 27.3 pg (ref 26.0–34.0)
MCHC: 32.7 g/dL (ref 30.0–36.0)
MCV: 83.6 fL (ref 80.0–100.0)
Monocytes Absolute: 0.4 10*3/uL (ref 0.1–1.0)
Monocytes Relative: 5 %
Neutro Abs: 6.1 10*3/uL (ref 1.7–7.7)
Neutrophils Relative %: 72 %
Platelets: 212 10*3/uL (ref 150–400)
RBC: 4.76 MIL/uL (ref 3.87–5.11)
RDW: 15.7 % — ABNORMAL HIGH (ref 11.5–15.5)
WBC: 8.5 10*3/uL (ref 4.0–10.5)
nRBC: 0.2 % (ref 0.0–0.2)

## 2022-11-27 LAB — VITAMIN D 25 HYDROXY (VIT D DEFICIENCY, FRACTURES): Vit D, 25-Hydroxy: 23.76 ng/mL — ABNORMAL LOW (ref 30–100)

## 2022-11-27 LAB — MAGNESIUM: Magnesium: 2 mg/dL (ref 1.7–2.4)

## 2022-11-27 MED ORDER — PALONOSETRON HCL INJECTION 0.25 MG/5ML
0.2500 mg | Freq: Once | INTRAVENOUS | Status: AC
  Administered 2022-11-27: 0.25 mg via INTRAVENOUS
  Filled 2022-11-27: qty 5

## 2022-11-27 MED ORDER — SODIUM CHLORIDE 0.9% FLUSH
10.0000 mL | Freq: Once | INTRAVENOUS | Status: AC
  Administered 2022-11-27: 10 mL via INTRAVENOUS

## 2022-11-27 MED ORDER — SODIUM CHLORIDE 0.9% FLUSH
10.0000 mL | INTRAVENOUS | Status: DC | PRN
  Administered 2022-11-27: 10 mL

## 2022-11-27 MED ORDER — DOXORUBICIN HCL CHEMO IV INJECTION 2 MG/ML
60.0000 mg/m2 | Freq: Once | INTRAVENOUS | Status: AC
  Administered 2022-11-27: 144 mg via INTRAVENOUS
  Filled 2022-11-27: qty 72

## 2022-11-27 MED ORDER — HEPARIN SOD (PORK) LOCK FLUSH 100 UNIT/ML IV SOLN
500.0000 [IU] | Freq: Once | INTRAVENOUS | Status: AC | PRN
  Administered 2022-11-27: 500 [IU]

## 2022-11-27 MED ORDER — SODIUM CHLORIDE 0.9 % IV SOLN
600.0000 mg/m2 | Freq: Once | INTRAVENOUS | Status: AC
  Administered 2022-11-27: 1500 mg via INTRAVENOUS
  Filled 2022-11-27: qty 75

## 2022-11-27 MED ORDER — SODIUM CHLORIDE 0.9 % IV SOLN: Freq: Once | INTRAVENOUS | Status: AC

## 2022-11-27 MED ORDER — SODIUM CHLORIDE 0.9 % IV SOLN
150.0000 mg | Freq: Once | INTRAVENOUS | Status: AC
  Administered 2022-11-27: 150 mg via INTRAVENOUS
  Filled 2022-11-27: qty 150

## 2022-11-27 MED ORDER — SODIUM CHLORIDE 0.9 % IV SOLN
10.0000 mg | Freq: Once | INTRAVENOUS | Status: AC
  Administered 2022-11-27: 10 mg via INTRAVENOUS
  Filled 2022-11-27: qty 1

## 2022-11-27 NOTE — Patient Instructions (Signed)
[facility] at [facility] Discharge Instructions   You were seen and examined today by Dr. Katragadda.  He reviewed the results of your lab work which are normal/stable.   We will proceed with your treatment today.  Return as scheduled.    Thank you for choosing [facility] at [facility] to provide your oncology and hematology care.  To afford each patient quality time with our provider, please arrive at least 15 minutes before your scheduled appointment time.   If you have a lab appointment with the Cancer Center please come in thru the Main Entrance and check in at the main information desk.  You need to re-schedule your appointment should you arrive 10 or more minutes late.  We strive to give you quality time with our providers, and arriving late affects you and other patients whose appointments are after yours.  Also, if you no show three or more times for appointments you may be dismissed from the clinic at the providers discretion.     Again, thank you for choosing [facility].  Our hope is that these requests will decrease the amount of time that you wait before being seen by our physicians.       _____________________________________________________________  Should you have questions after your visit to [facility], please contact our office at [phone] and follow the prompts.  Our office hours are 8:00 a.m. and 4:30 p.m. Monday - Friday.  Please note that voicemails left after 4:00 p.m. may not be returned until the following business day.  We are closed weekends and major holidays.  You do have access to a nurse 24-7, just call the main number to the clinic [phone] and do not press any options, hold on the line and a nurse will answer the phone.    For prescription refill requests, have your pharmacy contact our office and allow 72 hours.    Due to Covid, you will need to wear a mask upon entering  the hospital. If you do not have a mask, a mask will be given to you at the Main Entrance upon arrival. For doctor visits, patients may have 1 support person age 18 or older with them. For treatment visits, patients can not have anyone with them due to social distancing guidelines and our immunocompromised population.      

## 2022-11-27 NOTE — Progress Notes (Signed)
Patient presents today for Doxorubicin and Cytoxan infusion. Patient is in satisfactory condition with no new complaints voiced.  Vital signs are stable.  Labs reviewed by Dr. Ellin Saba during the office visit and all labs are within treatment parameters.  We will proceed with treatment per MD orders.   Treatment given today per MD orders. Tolerated infusion without adverse affects. Vital signs stable. No complaints at this time. Discharged from clinic ambulatory in stable condition. Alert and oriented x 3. F/U with [facility] as scheduled.

## 2022-11-27 NOTE — Progress Notes (Signed)
[facility] [address], [place] [address]    Clinic Day:  11/27/2022  Referring physician: [name], MD  Patient Care Team: [name], MD as PCP - General (Internal Medicine) [name], MD as Consulting Physician (Hematology)   ASSESSMENT & PLAN:   Assessment: 1.  T1cN1 G2 left breast IDC, ER/PR positive, HER2 negative: - She felt a lump in her breast in December 2023. - Left breast subareolar mass at 9:00 biopsy (08/10/2022): grade 2 IDC, ER 95% strong intensity, PR 90% strong intensity, Ki-67 10%, HER2 1+ - Left mastectomy and SLNB (08/28/2022): 1.6 x 1.2 x 0.7 cm IDC, margins negative, LVI present, 1/1 lymph node with metastatic carcinoma, metastasis 15 mm, focal extracapsular extension.  PT1 cpN1a. 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  No prior history of arterial/venous thrombosis/strokes. - PET scan (10/19/2022): No evidence of hypermetabolic metastatic disease.  Diffuse hepatic steatosis. - Adjuvant AC started on 10/25/2022.   2.  Social/family history: - Seen today with her husband and daughter.  She worked as a Conservation officer, nature.  Current active smoker, 5 to 7 cigarettes/day, started at age 42. - Mother had breast cancer at age 40.  Father had skin cancer.  Paternal grandfather had colon cancer.  Maternal grandfather had prostate cancer.  1 maternal cousin had pancreatic cancer.  2 paternal cousins have cancer.    Plan: 1.  T1cN1 G2 left breast IDC, ER/PR positive, HER2 negative: - She has tolerated cycle 2 reasonably well on 11/09/2022. - She had constipation and gas for 2 days after last treatment.  Later it turned into diarrhea with 4-5 watery stools per day.  Pepto-Bismol helped.  She had occasional nausea but denied any vomiting. - I have recommended that she start taking Imodium for diarrhea. - Reviewed labs today: ALT elevated at 88.  Rest of LFTs normal.  Creatinine normal.  CBC grossly normal.  Vitamin D is low  at 23. - Proceed with cycle 3 today.  RTC 2 weeks for follow-up.   2.  Elevated LFTs: - ALT elevated at 88.  Most likely from fatty liver.  Closely monitor.   3.  High risk drug monitoring: - 2D echo on 10/23/2022: EF 55-60%.    Orders Placed This Encounter  Procedures   Magnesium    Standing Status:   Future    Standing Expiration Date:   02/01/2024   CBC with Differential    Standing Status:   Future    Standing Expiration Date:   02/01/2024   Comprehensive metabolic panel    Standing Status:   Future    Standing Expiration Date:   02/01/2024   Magnesium    Standing Status:   Future    Standing Expiration Date:   02/08/2024   CBC with Differential    Standing Status:   Future    Standing Expiration Date:   02/08/2024   Comprehensive metabolic panel    Standing Status:   Future    Standing Expiration Date:   02/08/2024   Magnesium    Standing Status:   Future    Standing Expiration Date:   02/15/2024   CBC with Differential    Standing Status:   Future    Standing Expiration Date:   02/15/2024   Comprehensive metabolic panel    Standing Status:   Future    Standing Expiration Date:   02/15/2024   Magnesium    Standing Status:   Future  Standing Expiration Date:   02/22/2024   CBC with Differential    Standing Status:   Future    Standing Expiration Date:   02/22/2024   Comprehensive metabolic panel    Standing Status:   Future    Standing Expiration Date:   02/22/2024   Magnesium    Standing Status:   Future    Standing Expiration Date:   02/29/2024   CBC with Differential    Standing Status:   Future    Standing Expiration Date:   02/29/2024   Comprehensive metabolic panel    Standing Status:   Future    Standing Expiration Date:   02/29/2024   Magnesium    Standing Status:   Future    Standing Expiration Date:   03/07/2024   CBC with Differential    Standing Status:   Future    Standing Expiration Date:   03/07/2024   Comprehensive metabolic panel    Standing Status:    Future    Standing Expiration Date:   03/07/2024      I,Katie Daubenspeck,acting as a scribe for [name], MD.,have documented all relevant documentation on the behalf of [name], MD,as directed by  [name], MD while in the presence of [name], MD.   I, [name] MD, have reviewed the above documentation for accuracy and completeness, and I agree with the above.   [name], MD   6/3/20246:16 PM  CHIEF COMPLAINT:   Diagnosis: left breast cancer    Cancer Staging  Breast cancer of upper-inner quadrant of left female breast [facility]) Staging form: Breast, AJCC 8th Edition - Clinical stage from 10/01/2022: Stage IB (cT1c, cN1, cM0, G2, ER+, PR+, HER2-) - Signed by [name], MD on 10/01/2022    Prior Therapy: left mastectomy and +SLNB with Dr. Algis Greenhouse on 08/28/22   Current Therapy:  Adjuvant chemotherapy with dose dense AC followed by weekly paclitaxel    HISTORY OF PRESENT ILLNESS:   Oncology History  Breast cancer of upper-inner quadrant of left female breast (HCC)  10/01/2022 Initial Diagnosis   Breast cancer of upper-inner quadrant of left female breast   10/01/2022 Cancer Staging   Staging form: Breast, AJCC 8th Edition - Clinical stage from 10/01/2022: Stage IB (cT1c, cN1, cM0, G2, ER+, PR+, HER2-) - Signed by [name], MD on 10/01/2022 Histopathologic type: Infiltrating duct carcinoma, NOS Stage prefix: Initial diagnosis Nuclear grade: G2 Histologic grading system: 3 grade system   10/25/2022 -  Chemotherapy   Patient is on Treatment Plan : BREAST ADJUVANT DOSE DENSE AC q14d / PACLitaxel q7d        INTERVAL HISTORY:   Kimmie is a 47 y.o. female presenting to clinic today for follow up of left breast cancer. She was last seen by me on 11/07/22.  Today, she states that she is doing well overall. Her appetite level is at 50%. Her energy level is at 50%.  PAST MEDICAL HISTORY:   Past  Medical History: Past Medical History:  Diagnosis Date   Allergy    seasonal   Antiphospholipid syndrome (HCC)    Anxiety    Arthritis    deterioration of spine, knees   Carpal tunnel syndrome of right wrist    Clotting disorder (HCC)    antiphospholipid syndrome   Depression    Encounter for general adult medical examination with abnormal findings 07/03/2022   Essential hypertension, benign 02/10/2020   Hyperlipidemia 08/02/2022   Sleep disorder 02/22/2016    Surgical  History: Past Surgical History:  Procedure Laterality Date   BREAST BIOPSY Left 08/10/2022   [place] LT BREAST BX W LOC DEV 1ST LESION IMG BX SPEC US GUIDE 08/10/2022 [facility]   BREAST BIOPSY Left 08/18/2022   MM LT BREAST BX W LOC DEV EA AD LESION IMG BX SPEC STEREO GUIDE 08/18/2022 [facility]   BREAST BIOPSY Left 08/18/2022   MM LT BREAST BX W LOC DEV 1ST LESION IMAGE BX SPEC STEREO GUIDE 08/18/2022 [facility]   CARPAL TUNNEL RELEASE Right 01/09/2019   Procedure: RIGHT CARPAL TUNNEL RELEASE;  Surgeon: Vickki Hearing, MD;  Location: [facility];  Service: Orthopedics;  Laterality: Right;   DILATION AND CURETTAGE OF UTERUS     x2   EYE SURGERY     lazy eye   IRRIGATION AND DEBRIDEMENT HEMATOMA Left 10/05/2022   Procedure: HEMATOMA EVACUATION OF LEFT BREAST WITH JP DRAIN PLACEMENT;  Surgeon: [name], MD;  Location: [facility];  Service: General;  Laterality: Left;   PARTIAL HYSTERECTOMY  2011   PORTACATH PLACEMENT Right 10/05/2022   Procedure: INSERTION PORT-A-CATH;  Surgeon: [name], MD;  Location: [facility];  Service: General;  Laterality: Right;   SIMPLE MASTECTOMY WITH AXILLARY SENTINEL NODE BIOPSY Left 08/28/2022   Procedure: SIMPLE MASTECTOMY WITH AXILLARY SENTINEL NODE BIOPSY;  Surgeon: [name], MD;  Location: [facility];  Service: General;  Laterality: Left;    Social History: Social History   Socioeconomic History   Marital status: Married    Spouse name: Not on  file   Number of children: 2   Years of education: Not on file   Highest education level: Not on file  Occupational History   Not on file  Tobacco Use   Smoking status: Every Day    Packs/day: 0.50    Years: 28.00    Additional pack years: 0.00    Total pack years: 14.00    Types: Cigarettes   Smokeless tobacco: Never   Tobacco comments:    She has been smoking since age 7, smokes half a pack daily,was doing one pack a day until last year   Vaping Use   Vaping Use: Never used  Substance and Sexual Activity   Alcohol use: No   Drug use: No   Sexual activity: Yes    Birth control/protection: Surgical  Other Topics Concern   Not on file  Social History Narrative   Associates degree in Engineer, site. 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 Has 2 children 1 son and 1 daughter. Married x 15 years.   Social Determinants of Health   Financial Resource Strain: High Risk (11/09/2022)   Overall Financial Resource Strain (CARDIA)    Difficulty of Paying Living Expenses: Hard  Food Insecurity: No Food Insecurity (08/30/2022)   Hunger Vital Sign    Worried About Running Out of Food in the Last Year: Never true    Ran Out of Food in the Last Year: Never true  Transportation Needs: No Transportation Needs (08/30/2022)   PRAPARE - Administrator, Civil Service (Medical): No    Lack of Transportation (Non-Medical): No  Physical Activity: Not on file  Stress: Not on file  Social Connections: Not on file  Intimate Partner Violence: Not At Risk (08/30/2022)   Humiliation, Afraid, Rape, and Kick questionnaire    Fear of Current or Ex-Partner: No    Emotionally Abused: No    Physically Abused: No    Sexually  Abused: No    Family History: Family History  Problem Relation Age of Onset   Cancer Mother 24       breast    Arthritis Mother    Hypertension Mother    Osteoporosis Mother    Thyroid disease Father    Hypertension Father    Skin cancer  Father    Mental illness Sister        depression ? bipolar   Dementia Maternal Grandmother    Cancer Maternal Grandfather        prostate   Heart disease Paternal Grandmother    Cancer Paternal Grandfather        colon   Colon cancer Paternal Grandfather    Mental illness Daughter    Pancreatic cancer Cousin        maternal cousin   Cancer Cousin        paternal, unknown type   Cancer Cousin        paternal, unknown type   Cancer - Lung Neg Hx    Cervical cancer Neg Hx     Current Medications:  Current Outpatient Medications:    acetaminophen (TYLENOL) 500 MG tablet, Take 1,000 mg by mouth every 6 (six) hours as needed for headache., Disp: , Rfl:    cycloPHOSphamide (CYTOXAN IJ), Inject as directed., Disp: , Rfl:    DOXOrubicin HCl (ADRIAMYCIN IV), Inject into the vein., Disp: , Rfl:    escitalopram (LEXAPRO) 10 MG tablet, Take 1 tablet (10 mg total) by mouth daily., Disp: 90 tablet, Rfl: 0   hydrOXYzine (VISTARIL) 50 MG capsule, Take 1 capsule (50 mg total) by mouth daily as needed for anxiety., Disp: 30 capsule, Rfl: 2   loratadine (CLARITIN) 10 MG tablet, Take 10 mg by mouth daily., Disp: , Rfl:    losartan (COZAAR) 25 MG tablet, Take 1 tablet (25 mg total) by mouth daily., Disp: 90 tablet, Rfl: 1   oxyCODONE (OXY IR/ROXICODONE) 5 MG immediate release tablet, Take 1-2 tablets (5-10 mg total) by mouth every 4 (four) hours as needed for severe pain or breakthrough pain., Disp: 15 tablet, Rfl: 0   pegfilgrastim-cbqv (UDENYCA) 6 MG/0.6ML injection, Administer 6 mg (0.6 ml) subcutaneously every 14 days on Day 3 of each treatment cycle - to be administered by a Registered Nurse, Disp: 0.6 mL, Rfl: 3   lidocaine-prilocaine (EMLA) cream, Apply to affected area once, Disp: 30 g, Rfl: 3   ondansetron (ZOFRAN-ODT) 4 MG disintegrating tablet, Take 1 tablet (4 mg total) by mouth every 6 (six) hours as needed for nausea., Disp: 20 tablet, Rfl: 0   prochlorperazine (COMPAZINE) 10 MG tablet,  Take 1 tablet (10 mg total) by mouth every 6 (six) hours as needed for nausea or vomiting., Disp: 30 tablet, Rfl: 1 No current facility-administered medications for this visit.  Facility-Administered Medications Ordered in Other Visits:    sodium chloride flush (NS) 0.9 % injection 10 mL, 10 mL, Intracatheter, PRN, [name], MD, 10 mL at 11/27/22 1542   Allergies: No Known Allergies  REVIEW OF SYSTEMS:   Review of Systems  Constitutional:  Negative for chills, fatigue and fever.  HENT:   Negative for lump/mass, mouth sores, nosebleeds, sore throat and trouble swallowing.   Eyes:  Negative for eye problems.  Respiratory:  Negative for cough and shortness of breath.   Cardiovascular:  Negative for chest pain, leg swelling and palpitations.  Gastrointestinal:  Positive for constipation and diarrhea. Negative for abdominal pain, nausea and vomiting.  Genitourinary:  Negative for bladder incontinence, difficulty urinating, dysuria, frequency, hematuria and nocturia.   Musculoskeletal:  Negative for arthralgias, back pain, flank pain, myalgias and neck pain.  Skin:  Negative for itching and rash.  Neurological:  Positive for dizziness and headaches. Negative for numbness.  Hematological:  Does not bruise/bleed easily.  Psychiatric/Behavioral:  Negative for depression, sleep disturbance and suicidal ideas. The patient is not nervous/anxious.   All other systems reviewed and are negative.    VITALS:   There were no vitals taken for this visit.  Wt Readings from Last 3 Encounters:  11/27/22 265 lb 12.8 oz (120.6 kg)  11/09/22 268 lb 4.8 oz (121.7 kg)  11/07/22 272 lb 3.2 oz (123.5 kg)    There is no height or weight on file to calculate BMI.  Performance status (ECOG): 1 - Symptomatic but completely ambulatory  PHYSICAL EXAM:   Physical Exam Vitals and nursing note reviewed. Exam conducted with a chaperone present.  Constitutional:      Appearance: Normal appearance.   Cardiovascular:     Rate and Rhythm: Normal rate and regular rhythm.     Pulses: Normal pulses.     Heart sounds: Normal heart sounds.  Pulmonary:     Effort: Pulmonary effort is normal.     Breath sounds: Normal breath sounds.  Abdominal:     Palpations: Abdomen is soft. There is no hepatomegaly, splenomegaly or mass.     Tenderness: There is no abdominal tenderness.  Musculoskeletal:     Right lower leg: No edema.     Left lower leg: No edema.  Lymphadenopathy:     Cervical: No cervical adenopathy.     Right cervical: No superficial, deep or posterior cervical adenopathy.    Left cervical: No superficial, deep or posterior cervical adenopathy.     Upper Body:     Right upper body: No supraclavicular or axillary adenopathy.     Left upper body: No supraclavicular or axillary adenopathy.  Neurological:     General: No focal deficit present.     Mental Status: She is alert and oriented to person, place, and time.  Psychiatric:        Mood and Affect: Mood normal.        Behavior: Behavior normal.     LABS:      Latest Ref Rng & Units 11/27/2022   10:32 AM 11/07/2022    1:14 PM 10/25/2022    8:50 AM  CBC  WBC 4.0 - 10.5 K/uL 8.5  15.2  7.3   Hemoglobin 12.0 - 15.0 g/dL 16.1  09.6  04.5   Hematocrit 36.0 - 46.0 % 39.8  39.6  40.2   Platelets 150 - 400 K/uL 212  200  271       Latest Ref Rng & Units 11/27/2022   10:32 AM 11/07/2022    1:14 PM 10/25/2022    8:50 AM  CMP  Glucose 70 - 99 mg/dL 409  811  914   BUN 6 - 20 mg/dL 12  12  15    Creatinine 0.44 - 1.00 mg/dL 7.82  9.56  2.13   Sodium 135 - 145 mmol/L 136  139  138   Potassium 3.5 - 5.1 mmol/L 3.6  3.4  3.5   Chloride 98 - 111 mmol/L 104  104  105   CO2 22 - 32 mmol/L 21  25  22    Calcium 8.9 - 10.3 mg/dL 8.6  8.5  9.0   Total Protein 6.5 -  8.1 g/dL 7.0  7.1  7.1   Total Bilirubin 0.3 - 1.2 mg/dL 0.5  0.3  0.6   Alkaline Phos 38 - 126 U/L 91  99  89   AST 15 - 41 U/L 41  50  49   ALT 0 - 44 U/L 88  63  79       No results found for: "CEA1", "CEA" / No results found for: "CEA1", "CEA" No results found for: "PSA1" No results found for: "UJW119" No results found for: "CAN125"  No results found for: "TOTALPROTELP", "ALBUMINELP", "A1GS", "A2GS", "BETS", "BETA2SER", "GAMS", "MSPIKE", "SPEI" No results found for: "TIBC", "FERRITIN", "IRONPCTSAT" No results found for: "LDH"   STUDIES:   No results found.

## 2022-11-27 NOTE — Progress Notes (Signed)
Patient has been examined by Dr. Ellin Saba. Vital signs and labs have been reviewed by MD - ANC, Creatinine, LFTs (ALT 88), hemoglobin, and platelets are within treatment parameters per M.D. - pt may proceed with treatment.  Primary RN and pharmacy notified.

## 2022-11-27 NOTE — Patient Instructions (Signed)
[facility]  Discharge Instructions: Thank you for choosing  Cancer Center to provide your oncology and hematology care.  If you have a lab appointment with the Cancer Center - please note that after April 8th, 2024, all labs will be drawn in the cancer center.  You do not have to check in or register with the main entrance as you have in the past but will complete your check-in in the cancer center.  Wear comfortable clothing and clothing appropriate for easy access to any Portacath or PICC line.   We strive to give you quality time with your provider. You may need to reschedule your appointment if you arrive late (15 or more minutes).  Arriving late affects you and other patients whose appointments are after yours.  Also, if you miss three or more appointments without notifying the office, you may be dismissed from the clinic at the provider's discretion.      For prescription refill requests, have your pharmacy contact our office and allow 72 hours for refills to be completed.    Today you received the following chemotherapy and/or immunotherapy agents Doxorubicin and Cytoxan   To help prevent nausea and vomiting after your treatment, we encourage you to take your nausea medication as directed.  Doxorubicin Injection What is this medication? DOXORUBICIN (dox oh ROO bi sin) treats some types of cancer. It works by slowing down the growth of cancer cells. This medicine may be used for other purposes; ask your health care provider or pharmacist if you have questions. COMMON BRAND NAME(S): Adriamycin, Adriamycin PFS, Adriamycin RDF, Rubex What should I tell my care team before I take this medication? They need to know if you have any of these conditions: Heart disease History of low blood cell levels caused by a medication Liver disease Recent or ongoing radiation An unusual or allergic reaction to doxorubicin, other medications, foods, dyes, or  preservatives If you or your partner are pregnant or trying to get pregnant Breast-feeding How should I use this medication? This medication is injected into a vein. It is given by your care team in a hospital or clinic setting. Talk to your care team about the use of this medication in children. Special care may be needed. Overdosage: If you think you have taken too much of this medicine contact a poison control center or emergency room at once. NOTE: This medicine is only for you. Do not share this medicine with others. What if I miss a dose? Keep appointments for follow-up doses. It is important not to miss your dose. Call your care team if you are unable to keep an appointment. What may interact with this medication? 6-mercaptopurine Paclitaxel Phenytoin St. John's wort Trastuzumab Verapamil This list may not describe all possible interactions. Give your health care provider a list of all the medicines, herbs, non-prescription drugs, or dietary supplements you use. Also tell them if you smoke, drink alcohol, or use illegal drugs. Some items may interact with your medicine. What should I watch for while using this medication? Your condition will be monitored carefully while you are receiving this medication. You may need blood work while taking this medication. This medication may make you feel generally unwell. This is not uncommon as chemotherapy can affect healthy cells as well as cancer cells. Report any side effects. Continue your course of treatment even though you feel ill unless your care team tells you to stop. There is a maximum amount of this medication you should  receive throughout your life. The amount depends on the medical condition being treated and your overall health. Your care team will watch how much of this medication you receive. Tell your care team if you have taken this medication before. Your urine may turn red for a few days after your dose. This is not blood. If  your urine is dark or brown, call your care team. In some cases, you may be given additional medications to help with side effects. Follow all directions for their use. This medication may increase your risk of getting an infection. Call your care team for advice if you get a fever, chills, sore throat, or other symptoms of a cold or flu. Do not treat yourself. Try to avoid being around people who are sick. This medication may increase your risk to bruise or bleed. Call your care team if you notice any unusual bleeding. Talk to your care team about your risk of cancer. You may be more at risk for certain types of cancers if you take this medication. You should make sure that you get enough Coenzyme Q10 while you are taking this medication. Discuss the foods you eat and the vitamins you take with your care team. Talk to your care team if you or your partner may be pregnant. Serious birth defects can occur if you take this medication during pregnancy and for 6 months after the last dose. Contraception is recommended while taking this medication and for 6 months after the last dose. Your care team can help you find the option that works for you. If your partner can get pregnant, use a condom while taking this medication and for 6 months after the last dose. Do not breastfeed while taking this medication. This medication may cause infertility. Talk to your care team if you are concerned about your fertility. What side effects may I notice from receiving this medication? Side effects that you should report to your care team as soon as possible: Allergic reactions--skin rash, itching, hives, swelling of the face, lips, tongue, or throat Heart failure--shortness of breath, swelling of the ankles, feet, or hands, sudden weight gain, unusual weakness or fatigue Heart rhythm changes--fast or irregular heartbeat, dizziness, feeling faint or lightheaded, chest pain, trouble breathing Infection--fever, chills,  cough, sore throat, wounds that don't heal, pain or trouble when passing urine, general feeling of discomfort or being unwell Low red blood cell level--unusual weakness or fatigue, dizziness, headache, trouble breathing Painful swelling, warmth, or redness of the skin, blisters or sores at the infusion site Unusual bruising or bleeding Side effects that usually do not require medical attention (report to your care team if they continue or are bothersome): Diarrhea Hair loss Nausea Pain, redness, or swelling with sores inside the mouth or throat Red urine This list may not describe all possible side effects. Call your doctor for medical advice about side effects. You may report side effects to FDA at 1-800-FDA-1088. Where should I keep my medication? This medication is given in a hospital or clinic. It will not be stored at home. NOTE: This sheet is a summary. It may not cover all possible information. If you have questions about this medicine, talk to your doctor, pharmacist, or health care provider.  2024 Elsevier/Gold Standard (2021-10-20 00:00:00)  Cyclophosphamide Injection What is this medication? CYCLOPHOSPHAMIDE (sye kloe FOSS fa mide) treats some types of cancer. It works by slowing down the growth of cancer cells. This medicine may be used for other purposes; ask your health care  provider or pharmacist if you have questions. COMMON BRAND NAME(S): Cyclophosphamide, Cytoxan, Neosar What should I tell my care team before I take this medication? They need to know if you have any of these conditions: Heart disease Irregular heartbeat or rhythm Infection Kidney problems Liver disease Low blood cell levels (white cells, platelets, or red blood cells) Lung disease Previous radiation Trouble passing urine An unusual or allergic reaction to cyclophosphamide, other medications, foods, dyes, or preservatives Pregnant or trying to get pregnant Breast-feeding How should I use this  medication? This medication is injected into a vein. It is given by your care team in a hospital or clinic setting. Talk to your care team about the use of this medication in children. Special care may be needed. Overdosage: If you think you have taken too much of this medicine contact a poison control center or emergency room at once. NOTE: This medicine is only for you. Do not share this medicine with others. What if I miss a dose? Keep appointments for follow-up doses. It is important not to miss your dose. Call your care team if you are unable to keep an appointment. What may interact with this medication? Amphotericin B Amiodarone Azathioprine Certain antivirals for HIV or hepatitis Certain medications for blood pressure, such as enalapril, lisinopril, quinapril Cyclosporine Diuretics Etanercept Indomethacin Medications that relax muscles Metronidazole Natalizumab Tamoxifen Warfarin This list may not describe all possible interactions. Give your health care provider a list of all the medicines, herbs, non-prescription drugs, or dietary supplements you use. Also tell them if you smoke, drink alcohol, or use illegal drugs. Some items may interact with your medicine. What should I watch for while using this medication? This medication may make you feel generally unwell. This is not uncommon as chemotherapy can affect healthy cells as well as cancer cells. Report any side effects. Continue your course of treatment even though you feel ill unless your care team tells you to stop. You may need blood work while you are taking this medication. This medication may increase your risk of getting an infection. Call your care team for advice if you get a fever, chills, sore throat, or other symptoms of a cold or flu. Do not treat yourself. Try to avoid being around people who are sick. Avoid taking medications that contain aspirin, acetaminophen, ibuprofen, naproxen, or ketoprofen unless  instructed by your care team. These medications may hide a fever. Be careful brushing or flossing your teeth or using a toothpick because you may get an infection or bleed more easily. If you have any dental work done, tell your dentist you are receiving this medication. Drink water or other fluids as directed. Urinate often, even at night. Some products may contain alcohol. Ask your care team if this medication contains alcohol. Be sure to tell all care teams you are taking this medicine. Certain medicines, like metronidazole and disulfiram, can cause an unpleasant reaction when taken with alcohol. The reaction includes flushing, headache, nausea, vomiting, sweating, and increased thirst. The reaction can last from 30 minutes to several hours. Talk to your care team if you wish to become pregnant or think you might be pregnant. This medication can cause serious birth defects if taken during pregnancy and for 1 year after the last dose. A negative pregnancy test is required before starting this medication. A reliable form of contraception is recommended while taking this medication and for 1 year after the last dose. Talk to your care team about reliable forms of contraception.  Do not father a child while taking this medication and for 4 months after the last dose. Use a condom during this time period. Do not breast-feed while taking this medication or for 1 week after the last dose. This medication may cause infertility. Talk to your care team if you are concerned about your fertility. Talk to your care team about your risk of cancer. You may be more at risk for certain types of cancer if you take this medication. What side effects may I notice from receiving this medication? Side effects that you should report to your care team as soon as possible: Allergic reactions--skin rash, itching, hives, swelling of the face, lips, tongue, or throat Dry cough, shortness of breath or trouble breathing Heart  failure--shortness of breath, swelling of the ankles, feet, or hands, sudden weight gain, unusual weakness or fatigue Heart muscle inflammation--unusual weakness or fatigue, shortness of breath, chest pain, fast or irregular heartbeat, dizziness, swelling of the ankles, feet, or hands Heart rhythm changes--fast or irregular heartbeat, dizziness, feeling faint or lightheaded, chest pain, trouble breathing Infection--fever, chills, cough, sore throat, wounds that don't heal, pain or trouble when passing urine, general feeling of discomfort or being unwell Kidney injury--decrease in the amount of urine, swelling of the ankles, hands, or feet Liver injury--right upper belly pain, loss of appetite, nausea, light-colored stool, dark yellow or brown urine, yellowing skin or eyes, unusual weakness or fatigue Low red blood cell level--unusual weakness or fatigue, dizziness, headache, trouble breathing Low sodium level--muscle weakness, fatigue, dizziness, headache, confusion Red or dark brown urine Unusual bruising or bleeding Side effects that usually do not require medical attention (report to your care team if they continue or are bothersome): Hair loss Irregular menstrual cycles or spotting Loss of appetite Nausea Pain, redness, or swelling with sores inside the mouth or throat Vomiting This list may not describe all possible side effects. Call your doctor for medical advice about side effects. You may report side effects to FDA at 1-800-FDA-1088. Where should I keep my medication? This medication is given in a hospital or clinic. It will not be stored at home. NOTE: This sheet is a summary. It may not cover all possible information. If you have questions about this medicine, talk to your doctor, pharmacist, or health care provider.  2024 Elsevier/Gold Standard (2021-10-28 00:00:00)    BELOW ARE SYMPTOMS THAT SHOULD BE REPORTED IMMEDIATELY: *FEVER GREATER THAN 100.4 F (38 C) OR HIGHER *CHILLS  OR SWEATING *NAUSEA AND VOMITING THAT IS NOT CONTROLLED WITH YOUR NAUSEA MEDICATION *UNUSUAL SHORTNESS OF BREATH *UNUSUAL BRUISING OR BLEEDING *URINARY PROBLEMS (pain or burning when urinating, or frequent urination) *BOWEL PROBLEMS (unusual diarrhea, constipation, pain near the anus) TENDERNESS IN MOUTH AND THROAT WITH OR WITHOUT PRESENCE OF ULCERS (sore throat, sores in mouth, or a toothache) UNUSUAL RASH, SWELLING OR PAIN  UNUSUAL VAGINAL DISCHARGE OR ITCHING   Items with * indicate a potential emergency and should be followed up as soon as possible or go to the Emergency Department if any problems should occur.  Please show the CHEMOTHERAPY ALERT CARD or IMMUNOTHERAPY ALERT CARD at check-in to the Emergency Department and triage nurse.  Should you have questions after your visit or need to cancel or reschedule your appointment, please contact [facility] [phone]  and follow the prompts.  Office hours are 8:00 a.m. to 4:30 p.m. Monday - Friday. Please note that voicemails left after 4:00 p.m. may not be returned until the following business day.  We are closed weekends and major holidays. You have access to a nurse at all times for urgent questions. Please call the main number to the clinic [phone] and follow the prompts.  For any non-urgent questions, you may also contact your provider using MyChart. We now offer e-Visits for anyone 86 and older to request care online for non-urgent symptoms. For details visit [email or web address].   Also download the MyChart app! Go to the app store, search "MyChart", open the app, select [place], and log in with your MyChart username and password.

## 2022-11-28 ENCOUNTER — Inpatient Hospital Stay: Payer: Managed Care, Other (non HMO) | Admitting: Licensed Clinical Social Worker

## 2022-11-28 ENCOUNTER — Ambulatory Visit: Payer: Managed Care, Other (non HMO)

## 2022-11-28 DIAGNOSIS — Z170 Estrogen receptor positive status [ER+]: Secondary | ICD-10-CM

## 2022-11-28 NOTE — Progress Notes (Signed)
[facility] CSW Progress Note  Clinical Child psychotherapist  received completed [employer] application from patient.  Medical form completed and pathology attached.  Grant submitted on behalf of pt and pt informed.  CSW to continue to provide support at appropriate throughout duration of treatment.        Rachel Moulds, LCSW Clinical Social Worker [facility]

## 2022-11-29 ENCOUNTER — Inpatient Hospital Stay: Payer: Managed Care, Other (non HMO)

## 2022-11-30 ENCOUNTER — Ambulatory Visit: Payer: Managed Care, Other (non HMO)

## 2022-11-30 ENCOUNTER — Inpatient Hospital Stay: Payer: Managed Care, Other (non HMO) | Admitting: Licensed Clinical Social Worker

## 2022-11-30 DIAGNOSIS — C50212 Malignant neoplasm of upper-inner quadrant of left female breast: Secondary | ICD-10-CM

## 2022-11-30 NOTE — Progress Notes (Signed)
[facility] CSW Progress Note  Visual merchandiser  received an Agricultural engineer from [employer] in [place] confirming that patient has been approved for a [employer].  CSW contacted pt to inform and forwarded confirmation email to pt.  Pt instructed to contact CSW should any questions arise regarding submission of bills.  CSW to remain available throughout duration of treatment as appropriate to provide support.        Rachel Moulds, LCSW Clinical Social Worker [facility]

## 2022-12-07 ENCOUNTER — Inpatient Hospital Stay: Payer: Managed Care, Other (non HMO) | Admitting: Licensed Clinical Social Worker

## 2022-12-07 NOTE — Progress Notes (Signed)
[facility] CSW Progress Note  Visual merchandiser  received copies of medical bills pt would like submitted to [employer] in [place] to be considered for payment from the grant she was awarded.  CSW submitted bills to [employer] in [place] on behalf of pt.  CSW to continue to follow as appropriate to provide support throughout duration of treatment.        Rachel Moulds, LCSW Clinical Social Worker [facility]

## 2022-12-11 NOTE — Progress Notes (Signed)
Diana Jensen [address], [place] [address]    Clinic Day:  12/12/2022  Referring physician: [name], MD  Patient Care Team: Diana Lade, MD as PCP - General (Internal Medicine) Diana Massed, MD as Consulting Physician (Hematology)   ASSESSMENT & PLAN:   Assessment: 1.  T1cN1 G2 left breast IDC, ER/PR positive, HER2 negative: - She felt a lump in her breast in December 2023. - Left breast subareolar mass at 9:00 biopsy (08/10/2022): grade 2 IDC, ER 95% strong intensity, PR 90% strong intensity, Ki-67 10%, HER2 1+ - Left mastectomy and SLNB (08/28/2022): 1.6 x 1.2 x 0.7 cm IDC, margins negative, LVI present, 1/1 lymph node with metastatic carcinoma, metastasis 15 mm, focal extracapsular extension.  PT1 cpN1a. 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  No prior history of arterial/venous thrombosis/strokes. - PET scan (10/19/2022): No evidence of hypermetabolic metastatic disease.  Diffuse hepatic steatosis. - Adjuvant AC started on 10/25/2022.   2.  Social/family history: - Seen today with her husband and daughter.  She worked as a Conservation officer, nature.  Current active smoker, 5 to 7 cigarettes/day, started at age 71. - Mother had breast cancer at age 41.  Father had skin cancer.  Paternal grandfather had colon cancer.  Maternal grandfather had prostate cancer.  1 maternal cousin had pancreatic cancer.  2 paternal cousins have cancer.    Plan: 1.  T1cN1 G2 left breast IDC, ER/PR positive, HER2 negative: - She has completed 3 cycles of dose dense AC. - She has difficulty swallowing the night of chemo which gets better the following day. - She had diarrhea on and off which was controlled with Imodium.  She also had nausea but denied any vomiting. - Labs today: Normal creatinine.  Elevated AST and ALT are stable.  CBC grossly normal. - Proceed with cycle 4 today.  RTC 2 weeks for initiation of weekly paclitaxel.   2.  Elevated LFTs: - AST  elevated at 49 and ALT at 80.  Likely from fatty liver.  Will closely monitor.   3.  High risk drug monitoring: - 2D echo on 10/23/2022: EF 55-60%.  4.  Low vitamin D levels: - Vitamin D level was 23.7.  She was started on vitamin D 1000 units daily.    No orders of the defined types were placed in this encounter.     I,Diana Jensen,acting as a Neurosurgeon for Diana Massed, MD.,have documented all relevant documentation on the behalf of Diana Massed, MD,as directed by  Diana Massed, MD while in the presence of Diana Massed, MD.   I, Diana Massed MD, have reviewed the above documentation for accuracy and completeness, and I agree with the above.   Diana Massed, MD   6/18/20241:34 PM  CHIEF COMPLAINT:   Diagnosis: left breast cancer    Cancer Staging  Breast cancer of upper-inner quadrant of left female breast [facility]) Staging form: Breast, AJCC 8th Edition - Clinical stage from 10/01/2022: Stage IB (cT1c, cN1, cM0, G2, ER+, PR+, HER2-) - Signed by Diana Massed, MD on 10/01/2022    Prior Therapy: left mastectomy and +SLNB with Dr. Algis Greenhouse on 08/28/22   Current Therapy:   Adjuvant chemotherapy with dose dense AC followed by weekly paclitaxel    HISTORY OF PRESENT ILLNESS:   Oncology History  Breast cancer of upper-inner quadrant of left female breast (HCC)  10/01/2022 Initial Diagnosis   Breast cancer of upper-inner quadrant of left female breast  10/01/2022 Cancer Staging   Staging form: Breast, AJCC 8th Edition - Clinical stage from 10/01/2022: Stage IB (cT1c, cN1, cM0, G2, ER+, PR+, HER2-) - Signed by Diana Massed, MD on 10/01/2022 Histopathologic type: Infiltrating duct carcinoma, NOS Stage prefix: Initial diagnosis Nuclear grade: G2 Histologic grading system: 3 grade system   10/25/2022 -  Chemotherapy   Patient is on Treatment Plan : BREAST ADJUVANT DOSE DENSE AC q14d / PACLitaxel q7d        INTERVAL HISTORY:    Diana Jensen is a 47 y.o. female presenting to clinic today for follow up of left breast cancer. She was last seen by me on 11/27/22.  Today, she states that she is doing well overall. Her appetite level is at 75%. Her energy level is at 60%.  PAST MEDICAL HISTORY:   Past Medical History: Past Medical History:  Diagnosis Date   Allergy    seasonal   Antiphospholipid syndrome (HCC)    Anxiety    Arthritis    deterioration of spine, knees   Carpal tunnel syndrome of right wrist    Clotting disorder (HCC)    antiphospholipid syndrome   Depression    Encounter for general adult medical examination with abnormal findings 07/03/2022   Essential hypertension, benign 02/10/2020   Hyperlipidemia 08/02/2022   Sleep disorder 02/22/2016    Surgical History: Past Surgical History:  Procedure Laterality Date   BREAST BIOPSY Left 08/10/2022   [place] LT BREAST BX W LOC DEV 1ST LESION IMG BX SPEC US GUIDE 08/10/2022 [facility]   BREAST BIOPSY Left 08/18/2022   MM LT BREAST BX W LOC DEV EA AD LESION IMG BX SPEC STEREO GUIDE 08/18/2022 [facility]   BREAST BIOPSY Left 08/18/2022   MM LT BREAST BX W LOC DEV 1ST LESION IMAGE BX SPEC STEREO GUIDE 08/18/2022 [facility]   CARPAL TUNNEL RELEASE Right 01/09/2019   Procedure: RIGHT CARPAL TUNNEL RELEASE;  Surgeon: Vickki Hearing, MD;  Location: [facility];  Service: Orthopedics;  Laterality: Right;   DILATION AND CURETTAGE OF UTERUS     x2   EYE SURGERY     lazy eye   IRRIGATION AND DEBRIDEMENT HEMATOMA Left 10/05/2022   Procedure: HEMATOMA EVACUATION OF LEFT BREAST WITH JP DRAIN PLACEMENT;  Surgeon: [name], MD;  Location: [facility];  Service: General;  Laterality: Left;   PARTIAL HYSTERECTOMY  2011   PORTACATH PLACEMENT Right 10/05/2022   Procedure: INSERTION PORT-A-CATH;  Surgeon: [name], MD;  Location: [facility];  Service: General;  Laterality: Right;   SIMPLE MASTECTOMY WITH AXILLARY SENTINEL NODE BIOPSY Left 08/28/2022    Procedure: SIMPLE MASTECTOMY WITH AXILLARY SENTINEL NODE BIOPSY;  Surgeon: [name], MD;  Location: [facility];  Service: General;  Laterality: Left;    Social History: Social History   Socioeconomic History   Marital status: Married    Spouse name: Not on file   Number of children: 2   Years of education: Not on file   Highest education level: Not on file  Occupational History   Not on file  Tobacco Use   Smoking status: Every Day    Packs/day: 0.50    Years: 28.00    Additional pack years: 0.00    Total pack years: 14.00    Types: Cigarettes   Smokeless tobacco: Never   Tobacco comments:    She has been smoking since age 24, smokes half a pack daily,was doing one pack a day until last year   Vaping Use  Vaping Use: Never used  Substance and Sexual Activity   Alcohol use: No   Drug use: No   Sexual activity: Yes    Birth control/protection: Surgical  Other Topics Concern   Not on file  Social History Narrative   Associates degree in Engineer, site. 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 Has 2 children 1 son and 1 daughter. Married x 15 years.   Social Determinants of Health   Financial Resource Strain: High Risk (11/09/2022)   Overall Financial Resource Strain (CARDIA)    Difficulty of Paying Living Expenses: Hard  Food Insecurity: No Food Insecurity (08/30/2022)   Hunger Vital Sign    Worried About Running Out of Food in the Last Year: Never true    Ran Out of Food in the Last Year: Never true  Transportation Needs: No Transportation Needs (08/30/2022)   PRAPARE - Administrator, Civil Service (Medical): No    Lack of Transportation (Non-Medical): No  Physical Activity: Not on file  Stress: Not on file  Social Connections: Not on file  Intimate Partner Violence: Not At Risk (08/30/2022)   Humiliation, Afraid, Rape, and Kick questionnaire    Fear of Current or Ex-Partner: No    Emotionally Abused: No    Physically  Abused: No    Sexually Abused: No    Family History: Family History  Problem Relation Age of Onset   Cancer Mother 97       breast    Arthritis Mother    Hypertension Mother    Osteoporosis Mother    Thyroid disease Father    Hypertension Father    Skin cancer Father    Mental illness Sister        depression ? bipolar   Dementia Maternal Grandmother    Cancer Maternal Grandfather        prostate   Heart disease Paternal Grandmother    Cancer Paternal Grandfather        colon   Colon cancer Paternal Grandfather    Mental illness Daughter    Pancreatic cancer Cousin        maternal cousin   Cancer Cousin        paternal, unknown type   Cancer Cousin        paternal, unknown type   Cancer - Lung Neg Hx    Cervical cancer Neg Hx     Current Medications:  Current Outpatient Medications:    acetaminophen (TYLENOL) 500 MG tablet, Take 1,000 mg by mouth every 6 (six) hours as needed for headache., Disp: , Rfl:    Cholecalciferol (VITAMIN D-3) 25 MCG (1000 UT) CAPS, Take 1 capsule by mouth daily., Disp: , Rfl:    cycloPHOSphamide (CYTOXAN IJ), Inject as directed., Disp: , Rfl:    DOXOrubicin HCl (ADRIAMYCIN IV), Inject into the vein., Disp: , Rfl:    escitalopram (LEXAPRO) 10 MG tablet, Take 1 tablet (10 mg total) by mouth daily., Disp: 90 tablet, Rfl: 0   hydrOXYzine (VISTARIL) 50 MG capsule, Take 1 capsule (50 mg total) by mouth daily as needed for anxiety., Disp: 30 capsule, Rfl: 2   lidocaine-prilocaine (EMLA) cream, Apply to affected area once, Disp: 30 g, Rfl: 3   loratadine (CLARITIN) 10 MG tablet, Take 10 mg by mouth daily., Disp: , Rfl:    losartan (COZAAR) 25 MG tablet, Take 1 tablet (25 mg total) by mouth daily., Disp: 90 tablet, Rfl: 1   ondansetron (ZOFRAN-ODT) 4 MG disintegrating tablet,  Take 1 tablet (4 mg total) by mouth every 6 (six) hours as needed for nausea., Disp: 20 tablet, Rfl: 0   oxyCODONE (OXY IR/ROXICODONE) 5 MG immediate release tablet, Take 1-2  tablets (5-10 mg total) by mouth every 4 (four) hours as needed for severe pain or breakthrough pain., Disp: 15 tablet, Rfl: 0   pegfilgrastim-cbqv (UDENYCA) 6 MG/0.6ML injection, Administer 6 mg (0.6 ml) subcutaneously every 14 days on Day 3 of each treatment cycle - to be administered by a Registered Nurse, Disp: 0.6 mL, Rfl: 3   prochlorperazine (COMPAZINE) 10 MG tablet, Take 1 tablet (10 mg total) by mouth every 6 (six) hours as needed for nausea or vomiting., Disp: 30 tablet, Rfl: 1 No current facility-administered medications for this visit.  Facility-Administered Medications Ordered in Other Visits:    0.9 %  sodium chloride infusion, , Intravenous, Once, Diana Massed, MD   cyclophosphamide (CYTOXAN) 1,500 mg in sodium chloride 0.9 % 250 mL chemo infusion, 600 mg/m2 (Treatment Plan Recorded), Intravenous, Once, Diana Massed, MD   dexamethasone (DECADRON) 10 mg in sodium chloride 0.9 % 50 mL IVPB, 10 mg, Intravenous, Once, Diana Massed, MD   DOXOrubicin (ADRIAMYCIN) chemo injection 144 mg, 60 mg/m2 (Treatment Plan Recorded), Intravenous, Once, Diana Massed, MD   fosaprepitant (EMEND) 150 mg in sodium chloride 0.9 % 145 mL IVPB, 150 mg, Intravenous, Once, Diana Massed, MD   heparin lock flush 100 unit/mL, 500 Units, Intracatheter, Once PRN, Diana Massed, MD   palonosetron (ALOXI) injection 0.25 mg, 0.25 mg, Intravenous, Once, Diana Massed, MD   sodium chloride flush (NS) 0.9 % injection 10 mL, 10 mL, Intracatheter, PRN, Diana Massed, MD   Allergies: No Known Allergies  REVIEW OF SYSTEMS:   Review of Systems  Constitutional:  Negative for chills, fatigue and fever.  HENT:   Negative for lump/mass, mouth sores, nosebleeds, sore throat and trouble swallowing.   Eyes:  Negative for eye problems.  Respiratory:  Negative for cough and shortness of breath.   Cardiovascular:  Negative for chest pain, leg swelling and palpitations.   Gastrointestinal:  Positive for diarrhea and nausea. Negative for abdominal pain, constipation and vomiting.  Genitourinary:  Negative for bladder incontinence, difficulty urinating, dysuria, frequency, hematuria and nocturia.   Musculoskeletal:  Negative for arthralgias, back pain, flank pain, myalgias and neck pain.  Skin:  Negative for itching and rash.  Neurological:  Positive for dizziness and headaches. Negative for numbness.  Hematological:  Does not bruise/bleed easily.  Psychiatric/Behavioral:  Negative for depression, sleep disturbance and suicidal ideas. The patient is not nervous/anxious.   All other systems reviewed and are negative.    VITALS:   There were no vitals taken for this visit.  Wt Readings from Last 3 Encounters:  12/12/22 264 lb 9.6 oz (120 kg)  11/27/22 265 lb 12.8 oz (120.6 kg)  11/09/22 268 lb 4.8 oz (121.7 kg)    There is no height or weight on file to calculate BMI.  Performance status (ECOG): 1 - Symptomatic but completely ambulatory  PHYSICAL EXAM:   Physical Exam Vitals and nursing note reviewed. Exam conducted with a chaperone present.  Constitutional:      Appearance: Normal appearance.  Cardiovascular:     Rate and Rhythm: Normal rate and regular rhythm.     Pulses: Normal pulses.     Heart sounds: Normal heart sounds.  Pulmonary:     Effort: Pulmonary effort is normal.     Breath sounds: Normal breath sounds.  Abdominal:  Palpations: Abdomen is soft. There is no hepatomegaly, splenomegaly or mass.     Tenderness: There is no abdominal tenderness.  Musculoskeletal:     Right lower leg: No edema.     Left lower leg: No edema.  Lymphadenopathy:     Cervical: No cervical adenopathy.     Right cervical: No superficial, deep or posterior cervical adenopathy.    Left cervical: No superficial, deep or posterior cervical adenopathy.     Upper Body:     Right upper body: No supraclavicular or axillary adenopathy.     Left upper body: No  supraclavicular or axillary adenopathy.  Neurological:     General: No focal deficit present.     Mental Status: She is alert and oriented to person, place, and time.  Psychiatric:        Mood and Affect: Mood normal.        Behavior: Behavior normal.     LABS:      Latest Ref Rng & Units 12/12/2022   12:26 PM 11/27/2022   10:32 AM 11/07/2022    1:14 PM  CBC  WBC 4.0 - 10.5 K/uL 12.7  8.5  15.2   Hemoglobin 12.0 - 15.0 g/dL 16.1  09.6  04.5   Hematocrit 36.0 - 46.0 % 36.7  39.8  39.6   Platelets 150 - 400 K/uL 170  212  200       Latest Ref Rng & Units 12/12/2022   12:26 PM 11/27/2022   10:32 AM 11/07/2022    1:14 PM  CMP  Glucose 70 - 99 mg/dL 409  811  914   BUN 6 - 20 mg/dL 11  12  12    Creatinine 0.44 - 1.00 mg/dL 7.82  9.56  2.13   Sodium 135 - 145 mmol/L 137  136  139   Potassium 3.5 - 5.1 mmol/L 3.5  3.6  3.4   Chloride 98 - 111 mmol/L 104  104  104   CO2 22 - 32 mmol/L 24  21  25    Calcium 8.9 - 10.3 mg/dL 8.4  8.6  8.5   Total Protein 6.5 - 8.1 g/dL 6.7  7.0  7.1   Total Bilirubin 0.3 - 1.2 mg/dL 0.8  0.5  0.3   Alkaline Phos 38 - 126 U/L 97  91  99   AST 15 - 41 U/L 49  41  50   ALT 0 - 44 U/L 80  88  63      No results found for: "CEA1", "CEA" / No results found for: "CEA1", "CEA" No results found for: "PSA1" No results found for: "YQM578" No results found for: "CAN125"  No results found for: "TOTALPROTELP", "ALBUMINELP", "A1GS", "A2GS", "BETS", "BETA2SER", "GAMS", "MSPIKE", "SPEI" No results found for: "TIBC", "FERRITIN", "IRONPCTSAT" No results found for: "LDH"   STUDIES:   No results found.

## 2022-12-12 ENCOUNTER — Inpatient Hospital Stay (HOSPITAL_BASED_OUTPATIENT_CLINIC_OR_DEPARTMENT_OTHER): Payer: Managed Care, Other (non HMO) | Admitting: Hematology

## 2022-12-12 ENCOUNTER — Inpatient Hospital Stay: Payer: Managed Care, Other (non HMO)

## 2022-12-12 VITALS — BP 123/83 | HR 95 | Temp 97.2°F | Resp 19 | Ht 67.0 in | Wt 264.6 lb

## 2022-12-12 VITALS — BP 127/82 | HR 74 | Temp 96.7°F | Resp 18

## 2022-12-12 DIAGNOSIS — Z95828 Presence of other vascular implants and grafts: Secondary | ICD-10-CM

## 2022-12-12 DIAGNOSIS — Z170 Estrogen receptor positive status [ER+]: Secondary | ICD-10-CM

## 2022-12-12 DIAGNOSIS — Z5111 Encounter for antineoplastic chemotherapy: Secondary | ICD-10-CM | POA: Diagnosis not present

## 2022-12-12 DIAGNOSIS — C50112 Malignant neoplasm of central portion of left female breast: Secondary | ICD-10-CM

## 2022-12-12 LAB — CBC WITH DIFFERENTIAL/PLATELET
Abs Immature Granulocytes: 0.9 10*3/uL — ABNORMAL HIGH (ref 0.00–0.07)
Band Neutrophils: 8 %
Basophils Absolute: 0.3 10*3/uL — ABNORMAL HIGH (ref 0.0–0.1)
Basophils Relative: 2 %
Eosinophils Absolute: 0.1 10*3/uL (ref 0.0–0.5)
Eosinophils Relative: 1 %
HCT: 36.7 % (ref 36.0–46.0)
Hemoglobin: 12.1 g/dL (ref 12.0–15.0)
Lymphocytes Relative: 10 %
Lymphs Abs: 1.3 10*3/uL (ref 0.7–4.0)
MCH: 27.6 pg (ref 26.0–34.0)
MCHC: 33 g/dL (ref 30.0–36.0)
MCV: 83.6 fL (ref 80.0–100.0)
Metamyelocytes Relative: 6 %
Monocytes Absolute: 0.5 10*3/uL (ref 0.1–1.0)
Monocytes Relative: 4 %
Myelocytes: 1 %
Neutro Abs: 9.7 10*3/uL — ABNORMAL HIGH (ref 1.7–7.7)
Neutrophils Relative %: 68 %
Platelets: 170 10*3/uL (ref 150–400)
RBC: 4.39 MIL/uL (ref 3.87–5.11)
RDW: 17.2 % — ABNORMAL HIGH (ref 11.5–15.5)
WBC: 12.7 10*3/uL — ABNORMAL HIGH (ref 4.0–10.5)
nRBC: 0.3 % — ABNORMAL HIGH (ref 0.0–0.2)

## 2022-12-12 LAB — COMPREHENSIVE METABOLIC PANEL
ALT: 80 U/L — ABNORMAL HIGH (ref 0–44)
AST: 49 U/L — ABNORMAL HIGH (ref 15–41)
Albumin: 3.8 g/dL (ref 3.5–5.0)
Alkaline Phosphatase: 97 U/L (ref 38–126)
Anion gap: 9 (ref 5–15)
BUN: 11 mg/dL (ref 6–20)
CO2: 24 mmol/L (ref 22–32)
Calcium: 8.4 mg/dL — ABNORMAL LOW (ref 8.9–10.3)
Chloride: 104 mmol/L (ref 98–111)
Creatinine, Ser: 0.73 mg/dL (ref 0.44–1.00)
GFR, Estimated: >60 mL/min (ref >60)
Glucose, Bld: 116 mg/dL — ABNORMAL HIGH (ref 70–99)
Potassium: 3.5 mmol/L (ref 3.5–5.1)
Sodium: 137 mmol/L (ref 135–145)
Total Bilirubin: 0.8 mg/dL (ref 0.3–1.2)
Total Protein: 6.7 g/dL (ref 6.5–8.1)

## 2022-12-12 LAB — MAGNESIUM: Magnesium: 2.1 mg/dL (ref 1.7–2.4)

## 2022-12-12 MED ORDER — SODIUM CHLORIDE 0.9 % IV SOLN
150.0000 mg | Freq: Once | INTRAVENOUS | Status: AC
  Administered 2022-12-12: 150 mg via INTRAVENOUS
  Filled 2022-12-12: qty 150

## 2022-12-12 MED ORDER — SODIUM CHLORIDE 0.9 % IV SOLN
10.0000 mg | Freq: Once | INTRAVENOUS | Status: AC
  Administered 2022-12-12: 10 mg via INTRAVENOUS
  Filled 2022-12-12: qty 10

## 2022-12-12 MED ORDER — DOXORUBICIN HCL CHEMO IV INJECTION 2 MG/ML
60.0000 mg/m2 | Freq: Once | INTRAVENOUS | Status: AC
  Administered 2022-12-12: 144 mg via INTRAVENOUS
  Filled 2022-12-12: qty 72

## 2022-12-12 MED ORDER — PALONOSETRON HCL INJECTION 0.25 MG/5ML
0.2500 mg | Freq: Once | INTRAVENOUS | Status: AC
  Administered 2022-12-12: 0.25 mg via INTRAVENOUS
  Filled 2022-12-12: qty 5

## 2022-12-12 MED ORDER — SODIUM CHLORIDE 0.9% FLUSH
10.0000 mL | INTRAVENOUS | Status: DC | PRN
  Administered 2022-12-12: 10 mL via INTRAVENOUS

## 2022-12-12 MED ORDER — SODIUM CHLORIDE 0.9 % IV SOLN
600.0000 mg/m2 | Freq: Once | INTRAVENOUS | Status: AC
  Administered 2022-12-12: 1500 mg via INTRAVENOUS
  Filled 2022-12-12: qty 75

## 2022-12-12 MED ORDER — SODIUM CHLORIDE 0.9% FLUSH
10.0000 mL | INTRAVENOUS | Status: DC | PRN
  Administered 2022-12-12: 10 mL

## 2022-12-12 MED ORDER — SODIUM CHLORIDE 0.9 % IV SOLN: Freq: Once | INTRAVENOUS | Status: AC

## 2022-12-12 MED ORDER — HEPARIN SOD (PORK) LOCK FLUSH 100 UNIT/ML IV SOLN
500.0000 [IU] | Freq: Once | INTRAVENOUS | Status: AC | PRN
  Administered 2022-12-12: 500 [IU]

## 2022-12-12 NOTE — Patient Instructions (Signed)
[facility] at [facility] Discharge Instructions   You were seen and examined today by Dr. Katragadda.  He reviewed the results of your lab work which are normal/stable.   We will proceed with your treatment today.  Return as scheduled.    Thank you for choosing [facility] at [facility] to provide your oncology and hematology care.  To afford each patient quality time with our provider, please arrive at least 15 minutes before your scheduled appointment time.   If you have a lab appointment with the Cancer Center please come in thru the Main Entrance and check in at the main information desk.  You need to re-schedule your appointment should you arrive 10 or more minutes late.  We strive to give you quality time with our providers, and arriving late affects you and other patients whose appointments are after yours.  Also, if you no show three or more times for appointments you may be dismissed from the clinic at the providers discretion.     Again, thank you for choosing [facility].  Our hope is that these requests will decrease the amount of time that you wait before being seen by our physicians.       _____________________________________________________________  Should you have questions after your visit to [facility], please contact our office at [phone] and follow the prompts.  Our office hours are 8:00 a.m. and 4:30 p.m. Monday - Friday.  Please note that voicemails left after 4:00 p.m. may not be returned until the following business day.  We are closed weekends and major holidays.  You do have access to a nurse 24-7, just call the main number to the clinic [phone] and do not press any options, hold on the line and a nurse will answer the phone.    For prescription refill requests, have your pharmacy contact our office and allow 72 hours.    Due to Covid, you will need to wear a mask upon entering  the hospital. If you do not have a mask, a mask will be given to you at the Main Entrance upon arrival. For doctor visits, patients may have 1 support person age 18 or older with them. For treatment visits, patients can not have anyone with them due to social distancing guidelines and our immunocompromised population.      

## 2022-12-12 NOTE — Progress Notes (Signed)
Patients port flushed without difficulty.  Good blood return noted with no bruising or swelling noted at site.  Patient remains accessed for treatment.  

## 2022-12-12 NOTE — Progress Notes (Signed)
Labs reviewed today with MD at office visit. Ok to treat per MD.  Treatment given per orders. Patient tolerated it well without problems. Vitals stable and discharged home from clinic ambulatory. Follow up as scheduled.  

## 2022-12-12 NOTE — Patient Instructions (Signed)
[facility]  Discharge Instructions: Thank you for choosing [facility] to provide your oncology and hematology care.  If you have a lab appointment with the Cancer Center - please note that after April 8th, 2024, all labs will be drawn in the cancer center.  You do not have to check in or register with the main entrance as you have in the past but will complete your check-in in the cancer center.  Wear comfortable clothing and clothing appropriate for easy access to any Portacath or PICC line.   We strive to give you quality time with your provider. You may need to reschedule your appointment if you arrive late (15 or more minutes).  Arriving late affects you and other patients whose appointments are after yours.  Also, if you miss three or more appointments without notifying the office, you may be dismissed from the clinic at the provider's discretion.      For prescription refill requests, have your pharmacy contact our office and allow 72 hours for refills to be completed.    Today you received the following chemotherapy and/or immunotherapy agents Adriamycin, cyclophosphamide    To help prevent nausea and vomiting after your treatment, we encourage you to take your nausea medication as directed.  BELOW ARE SYMPTOMS THAT SHOULD BE REPORTED IMMEDIATELY: *FEVER GREATER THAN 100.4 F (38 C) OR HIGHER *CHILLS OR SWEATING *NAUSEA AND VOMITING THAT IS NOT CONTROLLED WITH YOUR NAUSEA MEDICATION *UNUSUAL SHORTNESS OF BREATH *UNUSUAL BRUISING OR BLEEDING *URINARY PROBLEMS (pain or burning when urinating, or frequent urination) *BOWEL PROBLEMS (unusual diarrhea, constipation, pain near the anus) TENDERNESS IN MOUTH AND THROAT WITH OR WITHOUT PRESENCE OF ULCERS (sore throat, sores in mouth, or a toothache) UNUSUAL RASH, SWELLING OR PAIN  UNUSUAL VAGINAL DISCHARGE OR ITCHING   Items with * indicate a potential emergency and should be followed up as soon as  possible or go to the Emergency Department if any problems should occur.  Please show the CHEMOTHERAPY ALERT CARD or IMMUNOTHERAPY ALERT CARD at check-in to the Emergency Department and triage nurse.  Should you have questions after your visit or need to cancel or reschedule your appointment, please contact [facility] [phone]  and follow the prompts.  Office hours are 8:00 a.m. to 4:30 p.m. Monday - Friday. Please note that voicemails left after 4:00 p.m. may not be returned until the following business day.  We are closed weekends and major holidays. You have access to a nurse at all times for urgent questions. Please call the main number to the clinic [phone] and follow the prompts.  For any non-urgent questions, you may also contact your provider using MyChart. We now offer e-Visits for anyone 38 and older to request care online for non-urgent symptoms. For details visit [email or web address].   Also download the MyChart app! Go to the app store, search "MyChart", open the app, select , and log in with your MyChart username and password.

## 2022-12-12 NOTE — Progress Notes (Signed)
Patient has been examined by Dr. Katragadda. Vital signs and labs have been reviewed by MD - ANC, Creatinine, LFTs, hemoglobin, and platelets are within treatment parameters per M.D. - pt may proceed with treatment.  Primary RN and pharmacy notified.  

## 2022-12-14 ENCOUNTER — Ambulatory Visit: Payer: Managed Care, Other (non HMO)

## 2022-12-18 ENCOUNTER — Other Ambulatory Visit: Payer: Self-pay | Admitting: Hematology

## 2022-12-18 DIAGNOSIS — C50212 Malignant neoplasm of upper-inner quadrant of left female breast: Secondary | ICD-10-CM

## 2022-12-19 ENCOUNTER — Other Ambulatory Visit: Payer: Self-pay

## 2022-12-19 NOTE — Progress Notes (Signed)
Discontinue diphenhydramine from oncology treatment plan --> Add Quzyttir (cetirizine) 10 mg IVPush x 1 as premedication for oncology treatment plan.  T.O. Dr Katragadda/Anaia Frith, PharmD  

## 2022-12-20 ENCOUNTER — Other Ambulatory Visit: Payer: Self-pay

## 2022-12-21 ENCOUNTER — Inpatient Hospital Stay: Payer: Managed Care, Other (non HMO) | Admitting: Hematology

## 2022-12-21 ENCOUNTER — Inpatient Hospital Stay: Payer: Managed Care, Other (non HMO)

## 2022-12-26 ENCOUNTER — Encounter: Payer: Self-pay | Admitting: Licensed Clinical Social Worker

## 2022-12-26 NOTE — Progress Notes (Signed)
[facility] [address], [place] [address]    Clinic Day:  12/27/2022  Referring physician: [name], MD  Patient Care Team: [name], MD as PCP - General (Internal Medicine) [name], MD as Consulting Physician (Hematology)   ASSESSMENT & PLAN:   Assessment: 1.  T1cN1 G2 left breast IDC, ER/PR positive, HER2 negative: - She felt a lump in her breast in December 2023. - Left breast subareolar mass at 9:00 biopsy (08/10/2022): grade 2 IDC, ER 95% strong intensity, PR 90% strong intensity, Ki-67 10%, HER2 1+ - Left mastectomy and SLNB (08/28/2022): 1.6 x 1.2 x 0.7 cm IDC, margins negative, LVI present, 1/1 lymph node with metastatic carcinoma, metastasis 15 mm, focal extracapsular extension.  PT1 cpN1a. 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  No prior history of arterial/venous thrombosis/strokes. - PET scan (10/19/2022): No evidence of hypermetabolic metastatic disease.  Diffuse hepatic steatosis. - 4 cycles of adjuvant AC from 10/25/2022 through 12/12/2022.  Weekly paclitaxel started on 12/27/2022.   2.  Social/family history: - Seen today with her husband and daughter.  She worked as a Conservation officer, nature.  Current active smoker, 5 to 7 cigarettes/day, started at age 63. - Mother had breast cancer at age 53.  Father had skin cancer.  Paternal grandfather had colon cancer.  Maternal grandfather had prostate cancer.  1 maternal cousin had pancreatic cancer.  2 paternal cousins have cancer.    Plan: 1.  T1cN1 G2 left breast IDC, ER/PR positive, HER2 negative: - She has completed cycle 4 of AC. - She reported tingling and numbness in the feet and hands on and off during the first week after cycle 4.  They resolved completely during week 2. - Reviewed labs today: ALT improved to 54 and AST normal.  Rest of LFTs are normal.  CBC grossly normal. - We talked about starting her on weekly paclitaxel.  We discussed side effects in detail.   Proceed with Taxol today and next week.  RTC 2 weeks for follow-up.   2.  Elevated LFTs: - ALT improved to 54 from 80.  AST normal at 33.   3.  High risk drug monitoring: - 2D echo on 10/23/2022: EF 55 to 60%.   4.  Low vitamin D levels: - Continue vitamin D 1000 units daily.    No orders of the defined types were placed in this encounter.     I,Katie Daubenspeck,acting as a Neurosurgeon for [name], MD.,have documented all relevant documentation on the behalf of [name], MD,as directed by  [name], MD while in the presence of [name], MD.   I, [name] MD, have reviewed the above documentation for accuracy and completeness, and I agree with the above.   [name], MD   7/3/20247:14 PM  CHIEF COMPLAINT:   Diagnosis: left breast cancer    Cancer Staging  Breast cancer of upper-inner quadrant of left female breast [facility]) Staging form: Breast, AJCC 8th Edition - Clinical stage from 10/01/2022: Stage IB (cT1c, cN1, cM0, G2, ER+, PR+, HER2-) - Signed by [name], MD on 10/01/2022    Prior Therapy: 1. left mastectomy and +SLNB with Dr. Algis Greenhouse on 08/28/22  2. Adjuvant AC, 4 cycles, 10/25/22 - 12/12/22  Current Therapy:  weekly paclitaxel    HISTORY OF PRESENT ILLNESS:   Oncology History  Breast cancer of upper-inner quadrant of left female breast (HCC)  10/01/2022 Initial Diagnosis   Breast cancer of  upper-inner quadrant of left female breast   10/01/2022 Cancer Staging   Staging form: Breast, AJCC 8th Edition - Clinical stage from 10/01/2022: Stage IB (cT1c, cN1, cM0, G2, ER+, PR+, HER2-) - Signed by [name], MD on 10/01/2022 Histopathologic type: Infiltrating duct carcinoma, NOS Stage prefix: Initial diagnosis Nuclear grade: G2 Histologic grading system: 3 grade system   10/25/2022 -  Chemotherapy   Patient is on Treatment Plan : BREAST ADJUVANT DOSE DENSE AC q14d / PACLitaxel q7d         INTERVAL HISTORY:   Diana Jensen is a 47 y.o. female presenting to clinic today for follow up of left breast cancer. She was last seen by me on 12/12/22.  Today, she states that she is doing well overall. Her appetite level is at 100%. Her energy level is at 50%.  PAST MEDICAL HISTORY:   Past Medical History: Past Medical History:  Diagnosis Date   Allergy    seasonal   Antiphospholipid syndrome (HCC)    Anxiety    Arthritis    deterioration of spine, knees   Carpal tunnel syndrome of right wrist    Clotting disorder (HCC)    antiphospholipid syndrome   Depression    Encounter for general adult medical examination with abnormal findings 07/03/2022   Essential hypertension, benign 02/10/2020   Hyperlipidemia 08/02/2022   Sleep disorder 02/22/2016    Surgical History: Past Surgical History:  Procedure Laterality Date   BREAST BIOPSY Left 08/10/2022   [place] LT BREAST BX W LOC DEV 1ST LESION IMG BX SPEC US GUIDE 08/10/2022 [facility]   BREAST BIOPSY Left 08/18/2022   MM LT BREAST BX W LOC DEV EA AD LESION IMG BX SPEC STEREO GUIDE 08/18/2022 [facility]   BREAST BIOPSY Left 08/18/2022   MM LT BREAST BX W LOC DEV 1ST LESION IMAGE BX SPEC STEREO GUIDE 08/18/2022 [facility]   CARPAL TUNNEL RELEASE Right 01/09/2019   Procedure: RIGHT CARPAL TUNNEL RELEASE;  Surgeon: Vickki Hearing, MD;  Location: [facility];  Service: Orthopedics;  Laterality: Right;   DILATION AND CURETTAGE OF UTERUS     x2   EYE SURGERY     lazy eye   IRRIGATION AND DEBRIDEMENT HEMATOMA Left 10/05/2022   Procedure: HEMATOMA EVACUATION OF LEFT BREAST WITH JP DRAIN PLACEMENT;  Surgeon: [name], MD;  Location: [facility];  Service: General;  Laterality: Left;   PARTIAL HYSTERECTOMY  2011   PORTACATH PLACEMENT Right 10/05/2022   Procedure: INSERTION PORT-A-CATH;  Surgeon: [name], MD;  Location: [facility];  Service: General;  Laterality: Right;   SIMPLE MASTECTOMY WITH AXILLARY SENTINEL NODE  BIOPSY Left 08/28/2022   Procedure: SIMPLE MASTECTOMY WITH AXILLARY SENTINEL NODE BIOPSY;  Surgeon: [name], MD;  Location: [facility];  Service: General;  Laterality: Left;    Social History: Social History   Socioeconomic History   Marital status: Married    Spouse name: Not on file   Number of children: 2   Years of education: Not on file   Highest education level: Not on file  Occupational History   Not on file  Tobacco Use   Smoking status: Every Day    Packs/day: 0.50    Years: 28.00    Additional pack years: 0.00    Total pack years: 14.00    Types: Cigarettes   Smokeless tobacco: Never   Tobacco comments:    She has been smoking since age 42, smokes half a pack daily,was doing one pack a  day until last year   Vaping Use   Vaping Use: Never used  Substance and Sexual Activity   Alcohol use: No   Drug use: No   Sexual activity: Yes    Birth control/protection: Surgical  Other Topics Concern   Not on file  Social History Narrative   Associates degree in Engineer, site. 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 Has 2 children 1 son and 1 daughter. Married x 15 years.   Social Determinants of Health   Financial Resource Strain: High Risk (11/09/2022)   Overall Financial Resource Strain (CARDIA)    Difficulty of Paying Living Expenses: Hard  Food Insecurity: No Food Insecurity (08/30/2022)   Hunger Vital Sign    Worried About Running Out of Food in the Last Year: Never true    Ran Out of Food in the Last Year: Never true  Transportation Needs: No Transportation Needs (08/30/2022)   PRAPARE - Administrator, Civil Service (Medical): No    Lack of Transportation (Non-Medical): No  Physical Activity: Not on file  Stress: Not on file  Social Connections: Not on file  Intimate Partner Violence: Not At Risk (08/30/2022)   Humiliation, Afraid, Rape, and Kick questionnaire    Fear of Current or Ex-Partner: No    Emotionally Abused:  No    Physically Abused: No    Sexually Abused: No    Family History: Family History  Problem Relation Age of Onset   Breast cancer Mother 29   Arthritis Mother    Hypertension Mother    Osteoporosis Mother    Thyroid disease Father    Hypertension Father    Skin cancer Father    Mental illness Sister        depression ? bipolar   Dementia Maternal Grandmother    Prostate cancer Maternal Grandfather        prostate   Heart disease Paternal Grandmother    Colon cancer Paternal Grandfather    Mental illness Daughter    Pancreatic cancer Cousin        maternal cousin   Cancer Cousin        paternal, unknown type   Cancer Cousin        paternal, unknown type   Cancer - Lung Neg Hx    Cervical cancer Neg Hx     Current Medications:  Current Outpatient Medications:    acetaminophen (TYLENOL) 500 MG tablet, Take 1,000 mg by mouth every 6 (six) hours as needed for headache., Disp: , Rfl:    Cholecalciferol (VITAMIN D-3) 25 MCG (1000 UT) CAPS, Take 1 capsule by mouth daily., Disp: , Rfl:    cycloPHOSphamide (CYTOXAN IJ), Inject as directed., Disp: , Rfl:    DOXOrubicin HCl (ADRIAMYCIN IV), Inject into the vein., Disp: , Rfl:    escitalopram (LEXAPRO) 10 MG tablet, Take 1 tablet (10 mg total) by mouth daily., Disp: 90 tablet, Rfl: 0   hydrOXYzine (VISTARIL) 50 MG capsule, Take 1 capsule (50 mg total) by mouth daily as needed for anxiety., Disp: 30 capsule, Rfl: 2   loratadine (CLARITIN) 10 MG tablet, Take 10 mg by mouth daily., Disp: , Rfl:    losartan (COZAAR) 25 MG tablet, Take 1 tablet (25 mg total) by mouth daily., Disp: 90 tablet, Rfl: 1   oxyCODONE (OXY IR/ROXICODONE) 5 MG immediate release tablet, Take 1-2 tablets (5-10 mg total) by mouth every 4 (four) hours as needed for severe pain or breakthrough pain., Disp: 15  tablet, Rfl: 0   pegfilgrastim-cbqv (UDENYCA) 6 MG/0.6ML injection, Administer 6 mg (0.6 ml) subcutaneously every 14 days on Day 3 of each treatment cycle - to  be administered by a Registered Nurse, Disp: 0.6 mL, Rfl: 3   lidocaine-prilocaine (EMLA) cream, Apply to affected area once, Disp: 30 g, Rfl: 3   ondansetron (ZOFRAN-ODT) 4 MG disintegrating tablet, Take 1 tablet (4 mg total) by mouth every 6 (six) hours as needed for nausea., Disp: 20 tablet, Rfl: 0   prochlorperazine (COMPAZINE) 10 MG tablet, TAKE ONE TABLET BY MOUTH EVERY 6 HOURS AS NEEDED FOR NAUSEA AND VOMITING, Disp: 60 tablet, Rfl: 3   Allergies: No Known Allergies  REVIEW OF SYSTEMS:   Review of Systems  Constitutional:  Negative for chills, fatigue and fever.  HENT:   Negative for lump/mass, mouth sores, nosebleeds, sore throat and trouble swallowing.   Eyes:  Negative for eye problems.  Respiratory:  Negative for cough and shortness of breath.   Cardiovascular:  Negative for chest pain, leg swelling and palpitations.  Gastrointestinal:  Positive for diarrhea. Negative for abdominal pain, constipation, nausea and vomiting.  Genitourinary:  Negative for bladder incontinence, difficulty urinating, dysuria, frequency, hematuria and nocturia.   Musculoskeletal:  Negative for arthralgias, back pain, flank pain, myalgias and neck pain.  Skin:  Negative for itching and rash.  Neurological:  Positive for dizziness, headaches and numbness.  Hematological:  Does not bruise/bleed easily.  Psychiatric/Behavioral:  Negative for depression, sleep disturbance and suicidal ideas. The patient is not nervous/anxious.   All other systems reviewed and are negative.    VITALS:   Blood pressure (!) 131/90, pulse 80, temperature 98.2 F (36.8 C), temperature source Oral, resp. rate 18, weight 264 lb (119.7 kg), SpO2 97 %.  Wt Readings from Last 3 Encounters:  12/27/22 264 lb (119.7 kg)  12/12/22 264 lb 9.6 oz (120 kg)  11/27/22 265 lb 12.8 oz (120.6 kg)    Body mass index is 41.35 kg/m.  Performance status (ECOG): 1 - Symptomatic but completely ambulatory  PHYSICAL EXAM:   Physical  Exam Vitals and nursing note reviewed. Exam conducted with a chaperone present.  Constitutional:      Appearance: Normal appearance.  Cardiovascular:     Rate and Rhythm: Normal rate and regular rhythm.     Pulses: Normal pulses.     Heart sounds: Normal heart sounds.  Pulmonary:     Effort: Pulmonary effort is normal.     Breath sounds: Normal breath sounds.  Abdominal:     Palpations: Abdomen is soft. There is no hepatomegaly, splenomegaly or mass.     Tenderness: There is no abdominal tenderness.  Musculoskeletal:     Right lower leg: No edema.     Left lower leg: No edema.  Lymphadenopathy:     Cervical: No cervical adenopathy.     Right cervical: No superficial, deep or posterior cervical adenopathy.    Left cervical: No superficial, deep or posterior cervical adenopathy.     Upper Body:     Right upper body: No supraclavicular or axillary adenopathy.     Left upper body: No supraclavicular or axillary adenopathy.  Neurological:     General: No focal deficit present.     Mental Status: She is alert and oriented to person, place, and time.  Psychiatric:        Mood and Affect: Mood normal.        Behavior: Behavior normal.     LABS:  Latest Ref Rng & Units 12/27/2022    8:44 AM 12/12/2022   12:26 PM 11/27/2022   10:32 AM  CBC  WBC 4.0 - 10.5 K/uL 7.9  12.7  8.5   Hemoglobin 12.0 - 15.0 g/dL 16.1  09.6  04.5   Hematocrit 36.0 - 46.0 % 37.6  36.7  39.8   Platelets 150 - 400 K/uL 235  170  212       Latest Ref Rng & Units 12/27/2022    8:44 AM 12/12/2022   12:26 PM 11/27/2022   10:32 AM  CMP  Glucose 70 - 99 mg/dL 97  409  811   BUN 6 - 20 mg/dL 8  11  12    Creatinine 0.44 - 1.00 mg/dL 9.14  7.82  9.56   Sodium 135 - 145 mmol/L 137  137  136   Potassium 3.5 - 5.1 mmol/L 3.5  3.5  3.6   Chloride 98 - 111 mmol/L 105  104  104   CO2 22 - 32 mmol/L 23  24  21    Calcium 8.9 - 10.3 mg/dL 8.7  8.4  8.6   Total Protein 6.5 - 8.1 g/dL 6.9  6.7  7.0   Total Bilirubin 0.3  - 1.2 mg/dL 0.6  0.8  0.5   Alkaline Phos 38 - 126 U/L 81  97  91   AST 15 - 41 U/L 33  49  41   ALT 0 - 44 U/L 54  80  88      No results found for: "CEA1", "CEA" / No results found for: "CEA1", "CEA" No results found for: "PSA1" No results found for: "OZH086" No results found for: "CAN125"  No results found for: "TOTALPROTELP", "ALBUMINELP", "A1GS", "A2GS", "BETS", "BETA2SER", "GAMS", "MSPIKE", "SPEI" No results found for: "TIBC", "FERRITIN", "IRONPCTSAT" No results found for: "LDH"   STUDIES:   No results found.

## 2022-12-27 ENCOUNTER — Inpatient Hospital Stay (HOSPITAL_BASED_OUTPATIENT_CLINIC_OR_DEPARTMENT_OTHER): Payer: Managed Care, Other (non HMO) | Admitting: Hematology

## 2022-12-27 ENCOUNTER — Inpatient Hospital Stay: Payer: Managed Care, Other (non HMO) | Attending: Hematology

## 2022-12-27 ENCOUNTER — Other Ambulatory Visit: Payer: Self-pay

## 2022-12-27 ENCOUNTER — Inpatient Hospital Stay: Payer: Managed Care, Other (non HMO)

## 2022-12-27 VITALS — BP 131/79 | HR 73 | Temp 98.9°F | Resp 18

## 2022-12-27 DIAGNOSIS — C50212 Malignant neoplasm of upper-inner quadrant of left female breast: Secondary | ICD-10-CM

## 2022-12-27 DIAGNOSIS — Z5111 Encounter for antineoplastic chemotherapy: Secondary | ICD-10-CM | POA: Insufficient documentation

## 2022-12-27 DIAGNOSIS — Z95828 Presence of other vascular implants and grafts: Secondary | ICD-10-CM

## 2022-12-27 DIAGNOSIS — Z79899 Other long term (current) drug therapy: Secondary | ICD-10-CM | POA: Insufficient documentation

## 2022-12-27 DIAGNOSIS — Z170 Estrogen receptor positive status [ER+]: Secondary | ICD-10-CM

## 2022-12-27 LAB — CBC WITH DIFFERENTIAL/PLATELET
Abs Immature Granulocytes: 0.6 10*3/uL — ABNORMAL HIGH (ref 0.00–0.07)
Basophils Absolute: 0.2 10*3/uL — ABNORMAL HIGH (ref 0.0–0.1)
Basophils Relative: 2 %
Eosinophils Absolute: 0.1 10*3/uL (ref 0.0–0.5)
Eosinophils Relative: 1 %
HCT: 37.6 % (ref 36.0–46.0)
Hemoglobin: 12.2 g/dL (ref 12.0–15.0)
Lymphocytes Relative: 14 %
Lymphs Abs: 1.1 10*3/uL (ref 0.7–4.0)
MCH: 27.1 pg (ref 26.0–34.0)
MCHC: 32.4 g/dL (ref 30.0–36.0)
MCV: 83.6 fL (ref 80.0–100.0)
Metamyelocytes Relative: 5 %
Monocytes Absolute: 0.2 10*3/uL (ref 0.1–1.0)
Monocytes Relative: 3 %
Myelocytes: 2 %
Neutro Abs: 5.8 10*3/uL (ref 1.7–7.7)
Neutrophils Relative %: 73 %
Platelets: 235 10*3/uL (ref 150–400)
RBC: 4.5 MIL/uL (ref 3.87–5.11)
RDW: 18.4 % — ABNORMAL HIGH (ref 11.5–15.5)
WBC: 7.9 10*3/uL (ref 4.0–10.5)
nRBC: 0.4 % — ABNORMAL HIGH (ref 0.0–0.2)

## 2022-12-27 LAB — COMPREHENSIVE METABOLIC PANEL
ALT: 54 U/L — ABNORMAL HIGH (ref 0–44)
AST: 33 U/L (ref 15–41)
Albumin: 3.8 g/dL (ref 3.5–5.0)
Alkaline Phosphatase: 81 U/L (ref 38–126)
Anion gap: 9 (ref 5–15)
BUN: 8 mg/dL (ref 6–20)
CO2: 23 mmol/L (ref 22–32)
Calcium: 8.7 mg/dL — ABNORMAL LOW (ref 8.9–10.3)
Chloride: 105 mmol/L (ref 98–111)
Creatinine, Ser: 0.76 mg/dL (ref 0.44–1.00)
GFR, Estimated: >60 mL/min (ref >60)
Glucose, Bld: 97 mg/dL (ref 70–99)
Potassium: 3.5 mmol/L (ref 3.5–5.1)
Sodium: 137 mmol/L (ref 135–145)
Total Bilirubin: 0.6 mg/dL (ref 0.3–1.2)
Total Protein: 6.9 g/dL (ref 6.5–8.1)

## 2022-12-27 LAB — MAGNESIUM: Magnesium: 2 mg/dL (ref 1.7–2.4)

## 2022-12-27 MED ORDER — SODIUM CHLORIDE 0.9% FLUSH
10.0000 mL | INTRAVENOUS | Status: DC | PRN
  Administered 2022-12-27: 10 mL via INTRAVENOUS

## 2022-12-27 MED ORDER — ONDANSETRON HCL 4 MG/2ML IJ SOLN
8.0000 mg | Freq: Once | INTRAMUSCULAR | Status: AC
  Administered 2022-12-27: 8 mg via INTRAVENOUS
  Filled 2022-12-27: qty 4

## 2022-12-27 MED ORDER — SODIUM CHLORIDE 0.9% FLUSH
10.0000 mL | INTRAVENOUS | Status: DC | PRN
  Administered 2022-12-27: 10 mL

## 2022-12-27 MED ORDER — SODIUM CHLORIDE 0.9 % IV SOLN
80.0000 mg/m2 | Freq: Once | INTRAVENOUS | Status: AC
  Administered 2022-12-27: 192 mg via INTRAVENOUS
  Filled 2022-12-27: qty 32

## 2022-12-27 MED ORDER — SODIUM CHLORIDE 0.9 % IV SOLN: Freq: Once | INTRAVENOUS | Status: AC

## 2022-12-27 MED ORDER — HEPARIN SOD (PORK) LOCK FLUSH 100 UNIT/ML IV SOLN
500.0000 [IU] | Freq: Once | INTRAVENOUS | Status: AC | PRN
  Administered 2022-12-27: 500 [IU]

## 2022-12-27 MED ORDER — SODIUM CHLORIDE 0.9 % IV SOLN
10.0000 mg | Freq: Once | INTRAVENOUS | Status: AC
  Administered 2022-12-27: 10 mg via INTRAVENOUS
  Filled 2022-12-27: qty 10

## 2022-12-27 MED ORDER — CETIRIZINE HCL 10 MG/ML IV SOLN
10.0000 mg | Freq: Once | INTRAVENOUS | Status: AC
  Administered 2022-12-27: 10 mg via INTRAVENOUS
  Filled 2022-12-27: qty 1

## 2022-12-27 MED ORDER — FAMOTIDINE IN NACL 20-0.9 MG/50ML-% IV SOLN
20.0000 mg | Freq: Once | INTRAVENOUS | Status: AC
  Administered 2022-12-27: 20 mg via INTRAVENOUS
  Filled 2022-12-27: qty 50

## 2022-12-27 NOTE — Progress Notes (Signed)
Patients port flushed without difficulty.  Good blood return noted with no bruising or swelling noted at site.  VSS. Patient remains accessed for treatment.  

## 2022-12-27 NOTE — Progress Notes (Signed)
Patient presents today for C1D1 Paclitaxel infusion. Patient is in satisfactory condition with no new complaints voiced.  Vital signs are stable.  Labs reviewed by Dr. Ellin Saba during the office visit and all labs are within treatment parameters.  We will proceed with treatment per MD orders.   Treatment given today per MD orders. Tolerated infusion without adverse affects. Vital signs stable. No complaints at this time. Discharged from clinic ambulatory in stable condition. Alert and oriented x 3. F/U with [facility] as scheduled.

## 2022-12-27 NOTE — Patient Instructions (Signed)
[facility] at [facility] Discharge Instructions   You were seen and examined today by Dr. Katragadda.  He reviewed the results of your lab work which are normal/stable.   We will proceed with your treatment today.  Return as scheduled.    Thank you for choosing [facility] at [facility] to provide your oncology and hematology care.  To afford each patient quality time with our provider, please arrive at least 15 minutes before your scheduled appointment time.   If you have a lab appointment with the Cancer Center please come in thru the Main Entrance and check in at the main information desk.  You need to re-schedule your appointment should you arrive 10 or more minutes late.  We strive to give you quality time with our providers, and arriving late affects you and other patients whose appointments are after yours.  Also, if you no show three or more times for appointments you may be dismissed from the clinic at the providers discretion.     Again, thank you for choosing [facility].  Our hope is that these requests will decrease the amount of time that you wait before being seen by our physicians.       _____________________________________________________________  Should you have questions after your visit to [facility], please contact our office at [phone] and follow the prompts.  Our office hours are 8:00 a.m. and 4:30 p.m. Monday - Friday.  Please note that voicemails left after 4:00 p.m. may not be returned until the following business day.  We are closed weekends and major holidays.  You do have access to a nurse 24-7, just call the main number to the clinic [phone] and do not press any options, hold on the line and a nurse will answer the phone.    For prescription refill requests, have your pharmacy contact our office and allow 72 hours.    Due to Covid, you will need to wear a mask upon entering  the hospital. If you do not have a mask, a mask will be given to you at the Main Entrance upon arrival. For doctor visits, patients may have 1 support person age 18 or older with them. For treatment visits, patients can not have anyone with them due to social distancing guidelines and our immunocompromised population.      

## 2022-12-27 NOTE — Patient Instructions (Signed)
[facility]  Discharge Instructions: Thank you for choosing [facility] to provide your oncology and hematology care.  If you have a lab appointment with the Cancer Center - please note that after April 8th, 2024, all labs will be drawn in the cancer center.  You do not have to check in or register with the main entrance as you have in the past but will complete your check-in in the cancer center.  Wear comfortable clothing and clothing appropriate for easy access to any Portacath or PICC line.   We strive to give you quality time with your provider. You may need to reschedule your appointment if you arrive late (15 or more minutes).  Arriving late affects you and other patients whose appointments are after yours.  Also, if you miss three or more appointments without notifying the office, you may be dismissed from the clinic at the provider's discretion.      For prescription refill requests, have your pharmacy contact our office and allow 72 hours for refills to be completed.    Today you received the following chemotherapy and/or immunotherapy agents Paclitaxel     To help prevent nausea and vomiting after your treatment, we encourage you to take your nausea medication as directed.  Paclitaxel Injection What is this medication? PACLITAXEL (PAK li TAX el) treats some types of cancer. It works by slowing down the growth of cancer cells. This medicine may be used for other purposes; ask your health care provider or pharmacist if you have questions. COMMON BRAND NAME(S): Onxol, Taxol What should I tell my care team before I take this medication? They need to know if you have any of these conditions: Heart disease Liver disease Low white blood cell levels An unusual or allergic reaction to paclitaxel, other medications, foods, dyes, or preservatives If you or your partner are pregnant or trying to get pregnant Breast-feeding How should I use this  medication? This medication is injected into a vein. It is given by your care team in a hospital or clinic setting. Talk to your care team about the use of this medication in children. While it may be given to children for selected conditions, precautions do apply. Overdosage: If you think you have taken too much of this medicine contact a poison control center or emergency room at once. NOTE: This medicine is only for you. Do not share this medicine with others. What if I miss a dose? Keep appointments for follow-up doses. It is important not to miss your dose. Call your care team if you are unable to keep an appointment. What may interact with this medication? Do not take this medication with any of the following: Live virus vaccines Other medications may affect the way this medication works. Talk with your care team about all of the medications you take. They may suggest changes to your treatment plan to lower the risk of side effects and to make sure your medications work as intended. This list may not describe all possible interactions. Give your health care provider a list of all the medicines, herbs, non-prescription drugs, or dietary supplements you use. Also tell them if you smoke, drink alcohol, or use illegal drugs. Some items may interact with your medicine. What should I watch for while using this medication? Your condition will be monitored carefully while you are receiving this medication. You may need blood work while taking this medication. This medication may make you feel generally unwell. This is not  uncommon as chemotherapy can affect healthy cells as well as cancer cells. Report any side effects. Continue your course of treatment even though you feel ill unless your care team tells you to stop. This medication can cause serious allergic reactions. To reduce the risk, your care team may give you other medications to take before receiving this one. Be sure to follow the directions  from your care team. This medication may increase your risk of getting an infection. Call your care team for advice if you get a fever, chills, sore throat, or other symptoms of a cold or flu. Do not treat yourself. Try to avoid being around people who are sick. This medication may increase your risk to bruise or bleed. Call your care team if you notice any unusual bleeding. Be careful brushing or flossing your teeth or using a toothpick because you may get an infection or bleed more easily. If you have any dental work done, tell your dentist you are receiving this medication. Talk to your care team if you may be pregnant. Serious birth defects can occur if you take this medication during pregnancy. Talk to your care team before breastfeeding. Changes to your treatment plan may be needed. What side effects may I notice from receiving this medication? Side effects that you should report to your care team as soon as possible: Allergic reactions--skin rash, itching, hives, swelling of the face, lips, tongue, or throat Heart rhythm changes--fast or irregular heartbeat, dizziness, feeling faint or lightheaded, chest pain, trouble breathing Increase in blood pressure Infection--fever, chills, cough, sore throat, wounds that don't heal, pain or trouble when passing urine, general feeling of discomfort or being unwell Low blood pressure--dizziness, feeling faint or lightheaded, blurry vision Low red blood cell level--unusual weakness or fatigue, dizziness, headache, trouble breathing Painful swelling, warmth, or redness of the skin, blisters or sores at the infusion site Pain, tingling, or numbness in the hands or feet Slow heartbeat--dizziness, feeling faint or lightheaded, confusion, trouble breathing, unusual weakness or fatigue Unusual bruising or bleeding Side effects that usually do not require medical attention (report to your care team if they continue or are bothersome): Diarrhea Hair  loss Joint pain Loss of appetite Muscle pain Nausea Vomiting This list may not describe all possible side effects. Call your doctor for medical advice about side effects. You may report side effects to FDA at 1-800-FDA-1088. Where should I keep my medication? This medication is given in a hospital or clinic. It will not be stored at home. NOTE: This sheet is a summary. It may not cover all possible information. If you have questions about this medicine, talk to your doctor, pharmacist, or health care provider.  2024 Elsevier/Gold Standard (2021-11-01 00:00:00)   BELOW ARE SYMPTOMS THAT SHOULD BE REPORTED IMMEDIATELY: *FEVER GREATER THAN 100.4 F (38 C) OR HIGHER *CHILLS OR SWEATING *NAUSEA AND VOMITING THAT IS NOT CONTROLLED WITH YOUR NAUSEA MEDICATION *UNUSUAL SHORTNESS OF BREATH *UNUSUAL BRUISING OR BLEEDING *URINARY PROBLEMS (pain or burning when urinating, or frequent urination) *BOWEL PROBLEMS (unusual diarrhea, constipation, pain near the anus) TENDERNESS IN MOUTH AND THROAT WITH OR WITHOUT PRESENCE OF ULCERS (sore throat, sores in mouth, or a toothache) UNUSUAL RASH, SWELLING OR PAIN  UNUSUAL VAGINAL DISCHARGE OR ITCHING   Items with * indicate a potential emergency and should be followed up as soon as possible or go to the Emergency Department if any problems should occur.  Please show the CHEMOTHERAPY ALERT CARD or IMMUNOTHERAPY ALERT CARD at check-in to the Emergency  Department and triage nurse.  Should you have questions after your visit or need to cancel or reschedule your appointment, please contact [facility] [phone]  and follow the prompts.  Office hours are 8:00 a.m. to 4:30 p.m. Monday - Friday. Please note that voicemails left after 4:00 p.m. may not be returned until the following business day.  We are closed weekends and major holidays. You have access to a nurse at all times for urgent questions. Please call the main number to the clinic  [phone] and follow the prompts.  For any non-urgent questions, you may also contact your provider using MyChart. We now offer e-Visits for anyone 60 and older to request care online for non-urgent symptoms. For details visit [email or web address].   Also download the MyChart app! Go to the app store, search "MyChart", open the app, select [place], and log in with your MyChart username and password.

## 2022-12-27 NOTE — Progress Notes (Signed)
Patient has been examined by Dr. Katragadda. Vital signs and labs have been reviewed by MD - ANC, Creatinine, LFTs, hemoglobin, and platelets are within treatment parameters per M.D. - pt may proceed with treatment.  Primary RN and pharmacy notified.  

## 2023-01-01 NOTE — Progress Notes (Signed)
24 hour call back. Patient states she had slight nausea after her first treatment that resolved after taking Compazine. Patient states for the first 24 hours she was fatigued. No other complaints voiced.

## 2023-01-02 ENCOUNTER — Other Ambulatory Visit: Payer: Self-pay

## 2023-01-03 ENCOUNTER — Inpatient Hospital Stay: Payer: Managed Care, Other (non HMO) | Admitting: Hematology

## 2023-01-03 ENCOUNTER — Inpatient Hospital Stay: Payer: Managed Care, Other (non HMO)

## 2023-01-03 VITALS — BP 117/83 | HR 84 | Temp 98.0°F | Resp 18

## 2023-01-03 DIAGNOSIS — Z5111 Encounter for antineoplastic chemotherapy: Secondary | ICD-10-CM | POA: Diagnosis not present

## 2023-01-03 DIAGNOSIS — C50212 Malignant neoplasm of upper-inner quadrant of left female breast: Secondary | ICD-10-CM

## 2023-01-03 LAB — CBC WITH DIFFERENTIAL/PLATELET
Abs Immature Granulocytes: 0.17 10*3/uL — ABNORMAL HIGH (ref 0.00–0.07)
Basophils Absolute: 0.1 10*3/uL (ref 0.0–0.1)
Basophils Relative: 1 %
Eosinophils Absolute: 0.2 10*3/uL (ref 0.0–0.5)
Eosinophils Relative: 2 %
HCT: 35.1 % — ABNORMAL LOW (ref 36.0–46.0)
Hemoglobin: 11.5 g/dL — ABNORMAL LOW (ref 12.0–15.0)
Immature Granulocytes: 2 %
Lymphocytes Relative: 11 %
Lymphs Abs: 0.9 10*3/uL (ref 0.7–4.0)
MCH: 27.8 pg (ref 26.0–34.0)
MCHC: 32.8 g/dL (ref 30.0–36.0)
MCV: 84.8 fL (ref 80.0–100.0)
Monocytes Absolute: 0.4 10*3/uL (ref 0.1–1.0)
Monocytes Relative: 5 %
Neutro Abs: 7 10*3/uL (ref 1.7–7.7)
Neutrophils Relative %: 79 %
Platelets: 349 10*3/uL (ref 150–400)
RBC: 4.14 MIL/uL (ref 3.87–5.11)
RDW: 18.8 % — ABNORMAL HIGH (ref 11.5–15.5)
WBC: 8.8 10*3/uL (ref 4.0–10.5)
nRBC: 0 % (ref 0.0–0.2)

## 2023-01-03 LAB — COMPREHENSIVE METABOLIC PANEL
ALT: 107 U/L — ABNORMAL HIGH (ref 0–44)
AST: 71 U/L — ABNORMAL HIGH (ref 15–41)
Albumin: 3.8 g/dL (ref 3.5–5.0)
Alkaline Phosphatase: 82 U/L (ref 38–126)
Anion gap: 10 (ref 5–15)
BUN: 12 mg/dL (ref 6–20)
CO2: 22 mmol/L (ref 22–32)
Calcium: 8.9 mg/dL (ref 8.9–10.3)
Chloride: 104 mmol/L (ref 98–111)
Creatinine, Ser: 0.74 mg/dL (ref 0.44–1.00)
GFR, Estimated: >60 mL/min (ref >60)
Glucose, Bld: 163 mg/dL — ABNORMAL HIGH (ref 70–99)
Potassium: 3.3 mmol/L — ABNORMAL LOW (ref 3.5–5.1)
Sodium: 136 mmol/L (ref 135–145)
Total Bilirubin: 0.7 mg/dL (ref 0.3–1.2)
Total Protein: 6.9 g/dL (ref 6.5–8.1)

## 2023-01-03 LAB — MAGNESIUM: Magnesium: 2 mg/dL (ref 1.7–2.4)

## 2023-01-03 MED ORDER — SODIUM CHLORIDE 0.9 % IV SOLN: Freq: Once | INTRAVENOUS | Status: AC

## 2023-01-03 MED ORDER — SODIUM CHLORIDE 0.9% FLUSH
10.0000 mL | Freq: Once | INTRAVENOUS | Status: AC
  Administered 2023-01-03: 10 mL via INTRAVENOUS

## 2023-01-03 MED ORDER — FAMOTIDINE IN NACL 20-0.9 MG/50ML-% IV SOLN
20.0000 mg | Freq: Once | INTRAVENOUS | Status: AC
  Administered 2023-01-03: 20 mg via INTRAVENOUS
  Filled 2023-01-03: qty 50

## 2023-01-03 MED ORDER — SODIUM CHLORIDE 0.9 % IV SOLN
80.0000 mg/m2 | Freq: Once | INTRAVENOUS | Status: AC
  Administered 2023-01-03: 192 mg via INTRAVENOUS
  Filled 2023-01-03: qty 32

## 2023-01-03 MED ORDER — SODIUM CHLORIDE 0.9% FLUSH
10.0000 mL | INTRAVENOUS | Status: DC | PRN
  Administered 2023-01-03: 10 mL

## 2023-01-03 MED ORDER — SODIUM CHLORIDE 0.9 % IV SOLN
10.0000 mg | Freq: Once | INTRAVENOUS | Status: AC
  Administered 2023-01-03: 10 mg via INTRAVENOUS
  Filled 2023-01-03: qty 10

## 2023-01-03 MED ORDER — ONDANSETRON HCL 4 MG/2ML IJ SOLN
8.0000 mg | Freq: Once | INTRAMUSCULAR | Status: AC
  Administered 2023-01-03: 8 mg via INTRAVENOUS
  Filled 2023-01-03: qty 4

## 2023-01-03 MED ORDER — HEPARIN SOD (PORK) LOCK FLUSH 100 UNIT/ML IV SOLN
500.0000 [IU] | Freq: Once | INTRAVENOUS | Status: AC | PRN
  Administered 2023-01-03: 500 [IU]

## 2023-01-03 MED ORDER — CETIRIZINE HCL 10 MG/ML IV SOLN
10.0000 mg | Freq: Once | INTRAVENOUS | Status: AC
  Administered 2023-01-03: 10 mg via INTRAVENOUS
  Filled 2023-01-03: qty 1

## 2023-01-03 NOTE — Patient Instructions (Signed)
[facility] [place]  Discharge Instructions: Thank you for choosing [facility] to provide your oncology and hematology care.  If you have a lab appointment with the Cancer Center - please note that after April 8th, 2024, all labs will be drawn in the cancer center.  You do not have to check in or register with the main entrance as you have in the past but will complete your check-in in the cancer center.  Wear comfortable clothing and clothing appropriate for easy access to any Portacath or PICC line.   We strive to give you quality time with your provider. You may need to reschedule your appointment if you arrive late (15 or more minutes).  Arriving late affects you and other patients whose appointments are after yours.  Also, if you miss three or more appointments without notifying the office, you may be dismissed from the clinic at the provider's discretion.      For prescription refill requests, have your pharmacy contact our office and allow 72 hours for refills to be completed.    Today you received the following chemotherapy and/or immunotherapy agents Taxol. Paclitaxel Injection What is this medication? PACLITAXEL (PAK li TAX el) treats some types of cancer. It works by slowing down the growth of cancer cells. This medicine may be used for other purposes; ask your health care provider or pharmacist if you have questions. COMMON BRAND NAME(S): Onxol, Taxol What should I tell my care team before I take this medication? They need to know if you have any of these conditions: Heart disease Liver disease Low white blood cell levels An unusual or allergic reaction to paclitaxel, other medications, foods, dyes, or preservatives If you or your partner are pregnant or trying to get pregnant Breast-feeding How should I use this medication? This medication is injected into a vein. It is given by your care team in a hospital or clinic setting. Talk to your care team  about the use of this medication in children. While it may be given to children for selected conditions, precautions do apply. Overdosage: If you think you have taken too much of this medicine contact a poison control center or emergency room at once. NOTE: This medicine is only for you. Do not share this medicine with others. What if I miss a dose? Keep appointments for follow-up doses. It is important not to miss your dose. Call your care team if you are unable to keep an appointment. What may interact with this medication? Do not take this medication with any of the following: Live virus vaccines Other medications may affect the way this medication works. Talk with your care team about all of the medications you take. They may suggest changes to your treatment plan to lower the risk of side effects and to make sure your medications work as intended. This list may not describe all possible interactions. Give your health care provider a list of all the medicines, herbs, non-prescription drugs, or dietary supplements you use. Also tell them if you smoke, drink alcohol, or use illegal drugs. Some items may interact with your medicine. What should I watch for while using this medication? Your condition will be monitored carefully while you are receiving this medication. You may need blood work while taking this medication. This medication may make you feel generally unwell. This is not uncommon as chemotherapy can affect healthy cells as well as cancer cells. Report any side effects. Continue your course of treatment even though you   feel ill unless your care team tells you to stop. This medication can cause serious allergic reactions. To reduce the risk, your care team may give you other medications to take before receiving this one. Be sure to follow the directions from your care team. This medication may increase your risk of getting an infection. Call your care team for advice if you get a fever,  chills, sore throat, or other symptoms of a cold or flu. Do not treat yourself. Try to avoid being around people who are sick. This medication may increase your risk to bruise or bleed. Call your care team if you notice any unusual bleeding. Be careful brushing or flossing your teeth or using a toothpick because you may get an infection or bleed more easily. If you have any dental work done, tell your dentist you are receiving this medication. Talk to your care team if you may be pregnant. Serious birth defects can occur if you take this medication during pregnancy. Talk to your care team before breastfeeding. Changes to your treatment plan may be needed. What side effects may I notice from receiving this medication? Side effects that you should report to your care team as soon as possible: Allergic reactions--skin rash, itching, hives, swelling of the face, lips, tongue, or throat Heart rhythm changes--fast or irregular heartbeat, dizziness, feeling faint or lightheaded, chest pain, trouble breathing Increase in blood pressure Infection--fever, chills, cough, sore throat, wounds that don't heal, pain or trouble when passing urine, general feeling of discomfort or being unwell Low blood pressure--dizziness, feeling faint or lightheaded, blurry vision Low red blood cell level--unusual weakness or fatigue, dizziness, headache, trouble breathing Painful swelling, warmth, or redness of the skin, blisters or sores at the infusion site Pain, tingling, or numbness in the hands or feet Slow heartbeat--dizziness, feeling faint or lightheaded, confusion, trouble breathing, unusual weakness or fatigue Unusual bruising or bleeding Side effects that usually do not require medical attention (report to your care team if they continue or are bothersome): Diarrhea Hair loss Joint pain Loss of appetite Muscle pain Nausea Vomiting This list may not describe all possible side effects. Call your doctor for  medical advice about side effects. You may report side effects to FDA at 1-800-FDA-1088. Where should I keep my medication? This medication is given in a hospital or clinic. It will not be stored at home. NOTE: This sheet is a summary. It may not cover all possible information. If you have questions about this medicine, talk to your doctor, pharmacist, or health care provider.  2024 Elsevier/Gold Standard (2021-11-01 00:00:00)       To help prevent nausea and vomiting after your treatment, we encourage you to take your nausea medication as directed.  BELOW ARE SYMPTOMS THAT SHOULD BE REPORTED IMMEDIATELY: *FEVER GREATER THAN 100.4 F (38 C) OR HIGHER *CHILLS OR SWEATING *NAUSEA AND VOMITING THAT IS NOT CONTROLLED WITH YOUR NAUSEA MEDICATION *UNUSUAL SHORTNESS OF BREATH *UNUSUAL BRUISING OR BLEEDING *URINARY PROBLEMS (pain or burning when urinating, or frequent urination) *BOWEL PROBLEMS (unusual diarrhea, constipation, pain near the anus) TENDERNESS IN MOUTH AND THROAT WITH OR WITHOUT PRESENCE OF ULCERS (sore throat, sores in mouth, or a toothache) UNUSUAL RASH, SWELLING OR PAIN  UNUSUAL VAGINAL DISCHARGE OR ITCHING   Items with * indicate a potential emergency and should be followed up as soon as possible or go to the Emergency Department if any problems should occur.  Please show the CHEMOTHERAPY ALERT CARD or IMMUNOTHERAPY ALERT CARD at check-in to the Emergency   Department and triage nurse.  Should you have questions after your visit or need to cancel or reschedule your appointment, please contact [facility] [place] [phone]  and follow the prompts.  Office hours are 8:00 a.m. to 4:30 p.m. Monday - Friday. Please note that voicemails left after 4:00 p.m. may not be returned until the following business day.  We are closed weekends and major holidays. You have access to a nurse at all times for urgent questions. Please call the main number to the clinic [phone] and  follow the prompts.  For any non-urgent questions, you may also contact your provider using MyChart. We now offer e-Visits for anyone 18 and older to request care online for non-urgent symptoms. For details visit [email or web address].   Also download the MyChart app! Go to the app store, search "MyChart", open the app, select [place], and log in with your MyChart username and password.   

## 2023-01-03 NOTE — Progress Notes (Signed)
Patient presents today for Taxol . Vital signs within parameters for treatment. ALT 107. Potassium 3.3. Standing orders followed.   Message received from Dr. Theo Dills to proceed with treatment.   Treatment given today per MD orders. Tolerated infusion without adverse affects. Vital signs stable. No complaints at this time. Discharged from clinic ambulatory in stable condition. Alert and oriented x 3. F/U with [facility] as scheduled.

## 2023-01-04 ENCOUNTER — Inpatient Hospital Stay: Payer: Managed Care, Other (non HMO) | Admitting: Licensed Clinical Social Worker

## 2023-01-04 ENCOUNTER — Inpatient Hospital Stay: Payer: Managed Care, Other (non HMO)

## 2023-01-04 DIAGNOSIS — Z800 Family history of malignant neoplasm of digestive organs: Secondary | ICD-10-CM

## 2023-01-04 DIAGNOSIS — C50212 Malignant neoplasm of upper-inner quadrant of left female breast: Secondary | ICD-10-CM

## 2023-01-04 DIAGNOSIS — Z8042 Family history of malignant neoplasm of prostate: Secondary | ICD-10-CM

## 2023-01-04 DIAGNOSIS — Z803 Family history of malignant neoplasm of breast: Secondary | ICD-10-CM

## 2023-01-04 NOTE — Progress Notes (Signed)
REFERRING PROVIDER: [name], MD [address] [place],  [place] [address]  PRIMARY PROVIDER:  [name], MD  PRIMARY REASON FOR VISIT:  1. Malignant neoplasm of upper-inner quadrant of left breast in female, estrogen receptor positive (HCC)   2. Family history of breast cancer   3. Family history of prostate cancer   4. Family history of colon cancer   5. Family history of pancreatic cancer    I connected with Diana Jensen on 01/04/2023 at 11:15 AM EDT by MyChart video conference and verified that I am speaking with the correct person using two identifiers.    Patient location: home Provider location: [facility]   HISTORY OF PRESENT ILLNESS:   Diana Jensen, a 47 y.o. female, was seen for a [facility] cancer genetics consultation at the request of Dr. Ellin Saba due to a personal and family history of breast cancer.  Diana Jensen presents to clinic today to discuss the possibility of a hereditary predisposition to cancer, genetic testing, and to further clarify her future cancer risks, as well as potential cancer risks for family members.   CANCER HISTORY:  In 2024, at the age of 77, Diana Jensen was diagnosed with invasive ductal carcinoma of the left breast, ER/PR+ HER2-. The treatment plan included left mastectomy, completed August 28, 2022 and chemotherapy.   Oncology History  Breast cancer of upper-inner quadrant of left female breast (HCC)  10/01/2022 Initial Diagnosis   Breast cancer of upper-inner quadrant of left female breast   10/01/2022 Cancer Staging   Staging form: Breast, AJCC 8th Edition - Clinical stage from 10/01/2022: Stage IB (cT1c, cN1, cM0, G2, ER+, PR+, HER2-) - Signed by [name], MD on 10/01/2022 Histopathologic type: Infiltrating duct carcinoma, NOS Stage prefix: Initial diagnosis Nuclear grade: G2 Histologic grading system: 3 grade system   10/25/2022 -  Chemotherapy   Patient is on Treatment Plan : BREAST ADJUVANT DOSE DENSE AC q14d /  PACLitaxel q7d      RISK FACTORS:  Menarche was at age 76.  First live birth at age 79.  Ovaries intact: yes.  Hysterectomy: yes.  Colonoscopy: no; not examined.   Past Medical History:  Diagnosis Date   Allergy    seasonal   Antiphospholipid syndrome (HCC)    Anxiety    Arthritis    deterioration of spine, knees   Carpal tunnel syndrome of right wrist    Clotting disorder (HCC)    antiphospholipid syndrome   Depression    Encounter for general adult medical examination with abnormal findings 07/03/2022   Essential hypertension, benign 02/10/2020   Hyperlipidemia 08/02/2022   Sleep disorder 02/22/2016    Past Surgical History:  Procedure Laterality Date   BREAST BIOPSY Left 08/10/2022   [place] LT BREAST BX W LOC DEV 1ST LESION IMG BX SPEC US GUIDE 08/10/2022 [facility]   BREAST BIOPSY Left 08/18/2022   MM LT BREAST BX W LOC DEV EA AD LESION IMG BX SPEC STEREO GUIDE 08/18/2022 [facility]AP[facility]   BREAST BIOPSY Left 08/18/2022   MM LT BREAST BX W LOC DEV 1ST LESION IMAGE BX SPEC STEREO GUIDE 08/18/2022 [facility]   CARPAL TUNNEL RELEASE Right 01/09/2019   Procedure: RIGHT CARPAL TUNNEL RELEASE;  Surgeon: Vickki Hearing, MD;  Location: [facility];  Service: Orthopedics;  Laterality: Right;   DILATION AND CURETTAGE OF UTERUS     x2   EYE SURGERY     lazy eye   IRRIGATION AND DEBRIDEMENT HEMATOMA Left 10/05/2022  Procedure: HEMATOMA EVACUATION OF LEFT BREAST WITH JP DRAIN PLACEMENT;  Surgeon: [name], MD;  Location: [facility];  Service: General;  Laterality: Left;   PARTIAL HYSTERECTOMY  2011   PORTACATH PLACEMENT Right 10/05/2022   Procedure: INSERTION PORT-A-CATH;  Surgeon: [name], MD;  Location: [facility];  Service: General;  Laterality: Right;   SIMPLE MASTECTOMY WITH AXILLARY SENTINEL NODE BIOPSY Left 08/28/2022   Procedure: SIMPLE MASTECTOMY WITH AXILLARY SENTINEL NODE BIOPSY;  Surgeon: [name], MD;  Location: [facility];  Service:  General;  Laterality: Left;    FAMILY HISTORY:  We obtained a detailed, 4-generation family history.  Significant diagnoses are listed below: Family History  Problem Relation Age of Onset   Breast cancer Mother 48   Arthritis Mother    Hypertension Mother    Osteoporosis Mother    Thyroid disease Father    Hypertension Father    Skin cancer Father    Mental illness Sister        depression ? bipolar   Dementia Maternal Grandmother    Prostate cancer Maternal Grandfather        prostate   Heart disease Paternal Grandmother    Colon cancer Paternal Grandfather    Mental illness Daughter    Pancreatic cancer Cousin        maternal cousin   Cancer Cousin        paternal, unknown type   Cancer Cousin        paternal, unknown type   Cancer - Lung Neg Hx    Cervical cancer Neg Hx    Diana Jensen has 1 son, 53, and 1 daughter, 85. She has 1 brother and 1 sister, no cancers.   Diana Jensen mother had breast cancer at 15 and is living at 58. A maternal cousin died of pancreatic cancer in his 39s. Maternal grandfather had prostate cancer over age 22 and possibly passed from it.  Diana Jensen father had skin cancer, Jensen exposure, he is living at 46. A paternal uncle had prostate cancer. Paternal aunt had skin cancer. A paternal aunt had cancer/tumor in her ear and is living at 41. Two paternal cousins had unknown types of cancer, one died in his 10s. Paternal grandfather had colon cancer and died over age 6.   Diana Jensen is unaware of previous family history of genetic testing for hereditary cancer risks. There is no reported Ashkenazi Jewish ancestry. There is no known consanguinity.    GENETIC COUNSELING ASSESSMENT: Diana Jensen is a 47 y.o. female with a personal and family history of breast cancer/other cancers which is somewhat suggestive of a hereditary cancer syndrome and predisposition to cancer. We, therefore, discussed and recommended the following at today's visit.   DISCUSSION: We  discussed that approximately 10% of breast cancer is hereditary. Most cases of hereditary breast cancer are associated with BRCA1/BRCA2 genes, although there are other genes associated with hereditary cancer as well. Cancers and risks are gene specific. We discussed that testing is beneficial for several reasons including knowing about cancer risks, identifying potential screening and risk-reduction options that may be appropriate, and to understand if other family members could be at risk for cancer and allow them to undergo genetic testing.   We reviewed the characteristics, features and inheritance patterns of hereditary cancer syndromes. We also discussed genetic testing, including the appropriate family members to test, the process of testing, insurance coverage and turn-around-time for results. We discussed the implications of a negative, positive and/or  variant of uncertain significant result. We recommended Diana Jensen pursue genetic testing for the [facility] Common Hereditary Cancers+RNA gene panel.   Based on Diana Jensen personal and family history of cancer, she meets medical criteria for genetic testing. Despite that she meets criteria, she may still have an out of pocket cost.   PLAN: After considering the risks, benefits, and limitations, Diana Jensen provided informed consent to pursue genetic testing and the blood sample was sent to [facility] for analysis of the Common Hereditary Cancers+RNA panel. Results should be available within approximately 2-3 weeks' time, at which point they will be disclosed by telephone to Diana Jensen, as will any additional recommendations warranted by these results. Diana Jensen will receive a summary of her genetic counseling visit and a copy of her results once available. This information will also be available in Epic.   Diana Jensen questions were answered to her satisfaction today. Our contact information was provided should additional questions or concerns arise.  Thank you for the referral and allowing [place] to share in the care of your patient.   Lacy Duverney, MS, [facility] Genetic Counselor [email or web address] Phone: [phone]  The patient was seen for a total of 20 minutes in virtual genetic counseling.  Dr. Orlie Dakin was available for discussion regarding this case.   _______________________________________________________________________ For Office Staff:  Number of people involved in session: 1 Was an Intern/ student involved with case: no

## 2023-01-09 ENCOUNTER — Other Ambulatory Visit: Payer: Self-pay

## 2023-01-10 NOTE — Progress Notes (Signed)
[facility] [address], [place] [address]    Clinic Day:  01/11/2023  Referring physician: [name], MD  Patient Care Team: Diana Lade, MD as PCP - General (Internal Medicine) Diana Massed, MD as Consulting Physician (Hematology)   ASSESSMENT & PLAN:   Assessment: 1.  T1cN1 G2 left breast IDC, ER/PR positive, HER2 negative: - She felt a lump in her breast in December 2023. - Left breast subareolar mass at 9:00 biopsy (08/10/2022): grade 2 IDC, ER 95% strong intensity, PR 90% strong intensity, Ki-67 10%, HER2 1+ - Left mastectomy and SLNB (08/28/2022): 1.6 x 1.2 x 0.7 cm IDC, margins negative, LVI present, 1/1 lymph node with metastatic carcinoma, metastasis 15 mm, focal extracapsular extension.  PT1 cpN1a. 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  No prior history of arterial/venous thrombosis/strokes. - PET scan (10/19/2022): No evidence of hypermetabolic metastatic disease.  Diffuse hepatic steatosis. - 4 cycles of adjuvant AC from 10/25/2022 through 12/12/2022.  Weekly paclitaxel started on 12/27/2022.   2.  Social/family history: - Seen today with her husband and daughter.  She worked as a Conservation officer, nature.  Current active smoker, 5 to 7 cigarettes/day, started at age 30. - Mother had breast cancer at age 14.  Father had skin cancer.  Paternal grandfather had colon cancer.  Maternal grandfather had prostate cancer.  1 maternal cousin had pancreatic cancer.  2 paternal cousins have cancer.    Plan: 1.  T1cN1 G2 left breast IDC, ER/PR positive, HER2 negative: - She has received 2 weekly doses of paclitaxel.  She has occasional nausea but not requiring Compazine.  No signs of peripheral neuropathy. - Reviewed labs today: Normal creatinine.  LFTs show stable elevated AST and ALT.  CBC grossly normal. - Continue weekly paclitaxel without any dose reduction.  RTC 3 weeks for follow-up and toxicity assessment.   2.  Elevated LFTs: -  Mildly elevated AST and ALT are stable at 50-100 respectively.   3.  High risk drug monitoring: - 2D echo on 10/23/2022 with EF 55 to 60%.  No signs of PND or orthopnea.   4.  Low vitamin D levels: - Last vitamin D was 23.  Continue vitamin D 1000 units daily.    No orders of the defined types were placed in this encounter.    Diana Jensen,acting as a Neurosurgeon for Diana Massed, MD.,have documented all relevant documentation on the behalf of Diana Massed, MD,as directed by  Diana Massed, MD while in the presence of Diana Massed, MD.  I, Diana Massed MD, have reviewed the above documentation for accuracy and completeness, and I agree with the above.    Diana Massed, MD   7/18/20245:54 PM  CHIEF COMPLAINT:   Diagnosis: left breast cancer    Cancer Staging  Breast cancer of upper-inner quadrant of left female breast [facility] Staging form: Breast, AJCC 8th Edition - Clinical stage from 10/01/2022: Stage IB (cT1c, cN1, cM0, G2, ER+, PR+, HER2-) - Signed by Diana Massed, MD on 10/01/2022    Prior Therapy: 1. left mastectomy and +SLNB with Dr. Algis Jensen on 08/28/22  2. Adjuvant AC, 4 cycles, 10/25/22 - 12/12/22  Current Therapy:  weekly paclitaxel    HISTORY OF PRESENT ILLNESS:   Oncology History  Breast cancer of upper-inner quadrant of left female breast (HCC)  10/01/2022 Initial Diagnosis   Breast cancer of upper-inner quadrant of left female breast   10/01/2022 Cancer Staging   Staging form:  Breast, AJCC 8th Edition - Clinical stage from 10/01/2022: Stage IB (cT1c, cN1, cM0, G2, ER+, PR+, HER2-) - Signed by Diana Massed, MD on 10/01/2022 Histopathologic type: Infiltrating duct carcinoma, NOS Stage prefix: Initial diagnosis Nuclear grade: G2 Histologic grading system: 3 grade system   10/25/2022 -  Chemotherapy   Patient is on Treatment Plan : BREAST ADJUVANT DOSE DENSE AC q14d / PACLitaxel q7d        INTERVAL HISTORY:    Diana Jensen is a 47 y.o. female presenting to clinic today for follow up of left breast cancer. She was last seen by me on 12/27/22.  Today, she states that she is doing well overall. Her appetite level is at 100%. Her energy level is at 50%.  PAST MEDICAL HISTORY:   Past Medical History: Past Medical History:  Diagnosis Date   Allergy    seasonal   Antiphospholipid syndrome (HCC)    Anxiety    Arthritis    deterioration of spine, knees   Carpal tunnel syndrome of right wrist    Clotting disorder (HCC)    antiphospholipid syndrome   Depression    Encounter for general adult medical examination with abnormal findings 07/03/2022   Essential hypertension, benign 02/10/2020   Hyperlipidemia 08/02/2022   Sleep disorder 02/22/2016    Surgical History: Past Surgical History:  Procedure Laterality Date   BREAST BIOPSY Left 08/10/2022   [place] LT BREAST BX W LOC DEV 1ST LESION IMG BX SPEC US GUIDE 08/10/2022 [facility]   BREAST BIOPSY Left 08/18/2022   MM LT BREAST BX W LOC DEV EA AD LESION IMG BX SPEC STEREO GUIDE 08/18/2022 [facility]   BREAST BIOPSY Left 08/18/2022   MM LT BREAST BX W LOC DEV 1ST LESION IMAGE BX SPEC STEREO GUIDE 08/18/2022 [facility]AM[facility]   CARPAL TUNNEL RELEASE Right 01/09/2019   Procedure: RIGHT CARPAL TUNNEL RELEASE;  Surgeon: Diana Hearing, MD;  Location: [facility];  Service: Orthopedics;  Laterality: Right;   DILATION AND CURETTAGE OF UTERUS     x2   EYE SURGERY     lazy eye   IRRIGATION AND DEBRIDEMENT HEMATOMA Left 10/05/2022   Procedure: HEMATOMA EVACUATION OF LEFT BREAST WITH JP DRAIN PLACEMENT;  Surgeon: Diana Roers, MD;  Location: [facility];  Service: General;  Laterality: Left;   PARTIAL HYSTERECTOMY  2011   PORTACATH PLACEMENT Right 10/05/2022   Procedure: INSERTION PORT-A-CATH;  Surgeon: Diana Roers, MD;  Location: [facility];  Service: General;  Laterality: Right;   SIMPLE MASTECTOMY WITH AXILLARY SENTINEL NODE BIOPSY Left 08/28/2022    Procedure: SIMPLE MASTECTOMY WITH AXILLARY SENTINEL NODE BIOPSY;  Surgeon: Diana Roers, MD;  Location: [facility];  Service: General;  Laterality: Left;    Social History: Social History   Socioeconomic History   Marital status: Married    Spouse name: Not on file   Number of children: 2   Years of education: Not on file   Highest education level: Not on file  Occupational History   Not on file  Tobacco Use   Smoking status: Every Day    Current packs/day: 0.50    Average packs/day: 0.5 packs/day for 28.0 years (14.0 ttl pk-yrs)    Types: Cigarettes   Smokeless tobacco: Never   Tobacco comments:    She has been smoking since age 32, smokes half a pack daily,was doing one pack a day until last year   Vaping Use   Vaping status: Never Used  Substance and Sexual Activity  Alcohol use: No   Drug use: No   Sexual activity: Yes    Birth control/protection: Surgical  Other Topics Concern   Not on file  Social History Narrative   Associates degree in Engineer, site. 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 Has 2 children 1 son and 1 daughter. Married x 15 years.   Social Determinants of Health   Financial Resource Strain: High Risk (11/09/2022)   Overall Financial Resource Strain (CARDIA)    Difficulty of Paying Living Expenses: Hard  Food Insecurity: No Food Insecurity (08/30/2022)   Hunger Vital Sign    Worried About Running Out of Food in the Last Year: Never true    Ran Out of Food in the Last Year: Never true  Transportation Needs: No Transportation Needs (08/30/2022)   PRAPARE - Administrator, Civil Service (Medical): No    Lack of Transportation (Non-Medical): No  Physical Activity: Not on file  Stress: Not on file  Social Connections: Not on file  Intimate Partner Violence: Not At Risk (08/30/2022)   Humiliation, Afraid, Rape, and Kick questionnaire    Fear of Current or Ex-Partner: No    Emotionally Abused: No    Physically  Abused: No    Sexually Abused: No    Family History: Family History  Problem Relation Age of Onset   Breast cancer Mother 10   Arthritis Mother    Hypertension Mother    Osteoporosis Mother    Thyroid disease Father    Hypertension Father    Skin cancer Father    Mental illness Sister        depression ? bipolar   Dementia Maternal Grandmother    Prostate cancer Maternal Grandfather        prostate   Heart disease Paternal Grandmother    Colon cancer Paternal Grandfather    Mental illness Daughter    Pancreatic cancer Cousin        maternal cousin   Cancer Cousin        paternal, unknown type   Cancer Cousin        paternal, unknown type   Cancer - Lung Neg Hx    Cervical cancer Neg Hx     Current Medications:  Current Outpatient Medications:    acetaminophen (TYLENOL) 500 MG tablet, Take 1,000 mg by mouth every 6 (six) hours as needed for headache., Disp: , Rfl:    Cholecalciferol (VITAMIN D-3) 25 MCG (1000 UT) CAPS, Take 1 capsule by mouth daily., Disp: , Rfl:    cycloPHOSphamide (CYTOXAN IJ), Inject as directed., Disp: , Rfl:    DOXOrubicin HCl (ADRIAMYCIN IV), Inject into the vein., Disp: , Rfl:    escitalopram (LEXAPRO) 10 MG tablet, Take 1 tablet (10 mg total) by mouth daily., Disp: 90 tablet, Rfl: 0   hydrOXYzine (VISTARIL) 50 MG capsule, Take 1 capsule (50 mg total) by mouth daily as needed for anxiety., Disp: 30 capsule, Rfl: 2   lidocaine-prilocaine (EMLA) cream, Apply to affected area once, Disp: 30 g, Rfl: 3   loratadine (CLARITIN) 10 MG tablet, Take 10 mg by mouth daily., Disp: , Rfl:    losartan (COZAAR) 25 MG tablet, Take 1 tablet (25 mg total) by mouth daily., Disp: 90 tablet, Rfl: 1   ondansetron (ZOFRAN-ODT) 4 MG disintegrating tablet, Take 1 tablet (4 mg total) by mouth every 6 (six) hours as needed for nausea., Disp: 20 tablet, Rfl: 0   oxyCODONE (OXY IR/ROXICODONE) 5 MG immediate release  tablet, Take 1-2 tablets (5-10 mg total) by mouth every 4 (four)  hours as needed for severe pain or breakthrough pain., Disp: 15 tablet, Rfl: 0   pegfilgrastim-cbqv (UDENYCA) 6 MG/0.6ML injection, Administer 6 mg (0.6 ml) subcutaneously every 14 days on Day 3 of each treatment cycle - to be administered by a Designer, jewellery, Disp: 0.6 mL, Rfl: 3   prochlorperazine (COMPAZINE) 10 MG tablet, TAKE ONE TABLET BY MOUTH EVERY 6 HOURS AS NEEDED FOR NAUSEA AND VOMITING, Disp: 60 tablet, Rfl: 3 No current facility-administered medications for this visit.  Facility-Administered Medications Ordered in Other Visits:    sodium chloride flush (NS) 0.9 % injection 10 mL, 10 mL, Intracatheter, PRN, Diana Massed, MD, 10 mL at 01/11/23 1415   Allergies: No Known Allergies  REVIEW OF SYSTEMS:   Review of Systems  Constitutional:  Positive for fatigue. Negative for chills and fever.  HENT:   Negative for lump/mass, mouth sores, nosebleeds, sore throat and trouble swallowing.   Eyes:  Negative for eye problems.  Respiratory:  Negative for cough and shortness of breath.   Cardiovascular:  Negative for chest pain, leg swelling and palpitations.  Gastrointestinal:  Positive for nausea. Negative for abdominal pain, constipation, diarrhea and vomiting.  Genitourinary:  Negative for bladder incontinence, difficulty urinating, dysuria, frequency, hematuria and nocturia.   Musculoskeletal:  Negative for arthralgias, back pain, flank pain, myalgias and neck pain.  Skin:  Negative for itching and rash.  Neurological:  Positive for dizziness. Negative for headaches and numbness.  Hematological:  Does not bruise/bleed easily.  Psychiatric/Behavioral:  Negative for depression, sleep disturbance and suicidal ideas. The patient is not nervous/anxious.   All other systems reviewed and are negative.    VITALS:   There were no vitals taken for this visit.  Wt Readings from Last 3 Encounters:  01/11/23 261 lb 12.8 oz (118.8 kg)  01/03/23 260 lb 6.4 oz (118.1 kg)  12/27/22 264  lb (119.7 kg)    There is no height or weight on file to calculate BMI.  Performance status (ECOG): 1 - Symptomatic but completely ambulatory  PHYSICAL EXAM:   Physical Exam Vitals and nursing note reviewed. Exam conducted with a chaperone present.  Constitutional:      Appearance: Normal appearance.  Cardiovascular:     Rate and Rhythm: Normal rate and regular rhythm.     Pulses: Normal pulses.     Heart sounds: Normal heart sounds.  Pulmonary:     Effort: Pulmonary effort is normal.     Breath sounds: Normal breath sounds.  Abdominal:     Palpations: Abdomen is soft. There is no hepatomegaly, splenomegaly or mass.     Tenderness: There is no abdominal tenderness.  Musculoskeletal:     Right lower leg: No edema.     Left lower leg: No edema.  Lymphadenopathy:     Cervical: No cervical adenopathy.     Right cervical: No superficial, deep or posterior cervical adenopathy.    Left cervical: No superficial, deep or posterior cervical adenopathy.     Upper Body:     Right upper body: No supraclavicular or axillary adenopathy.     Left upper body: No supraclavicular or axillary adenopathy.  Neurological:     General: No focal deficit present.     Mental Status: She is alert and oriented to person, place, and time.  Psychiatric:        Mood and Affect: Mood normal.        Behavior: Behavior  normal.     LABS:      Latest Ref Rng & Units 01/11/2023    9:49 AM 01/03/2023   10:42 AM 12/27/2022    8:44 AM  CBC  WBC 4.0 - 10.5 K/uL 6.8  8.8  7.9   Hemoglobin 12.0 - 15.0 g/dL 16.1  09.6  04.5   Hematocrit 36.0 - 46.0 % 36.1  35.1  37.6   Platelets 150 - 400 K/uL 289  349  235       Latest Ref Rng & Units 01/11/2023    9:49 AM 01/03/2023   10:42 AM 12/27/2022    8:44 AM  CMP  Glucose 70 - 99 mg/dL 409  811  97   BUN 6 - 20 mg/dL 14  12  8    Creatinine 0.44 - 1.00 mg/dL 9.14  7.82  9.56   Sodium 135 - 145 mmol/L 135  136  137   Potassium 3.5 - 5.1 mmol/L 3.4  3.3  3.5    Chloride 98 - 111 mmol/L 104  104  105   CO2 22 - 32 mmol/L 21  22  23    Calcium 8.9 - 10.3 mg/dL 8.6  8.9  8.7   Total Protein 6.5 - 8.1 g/dL 6.9  6.9  6.9   Total Bilirubin 0.3 - 1.2 mg/dL 0.7  0.7  0.6   Alkaline Phos 38 - 126 U/L 82  82  81   AST 15 - 41 U/L 52  71  33   ALT 0 - 44 U/L 100  107  54      No results found for: "CEA1", "CEA" / No results found for: "CEA1", "CEA" No results found for: "PSA1" No results found for: "OZH086" No results found for: "CAN125"  No results found for: "TOTALPROTELP", "ALBUMINELP", "A1GS", "A2GS", "BETS", "BETA2SER", "GAMS", "MSPIKE", "SPEI" No results found for: "TIBC", "FERRITIN", "IRONPCTSAT" No results found for: "LDH"   STUDIES:   No results found.

## 2023-01-11 ENCOUNTER — Inpatient Hospital Stay: Payer: Managed Care, Other (non HMO)

## 2023-01-11 ENCOUNTER — Encounter: Payer: Self-pay | Admitting: Hematology

## 2023-01-11 ENCOUNTER — Inpatient Hospital Stay (HOSPITAL_BASED_OUTPATIENT_CLINIC_OR_DEPARTMENT_OTHER): Payer: Managed Care, Other (non HMO) | Admitting: Hematology

## 2023-01-11 VITALS — BP 120/76 | HR 79 | Temp 98.4°F | Resp 18 | Ht 67.0 in | Wt 261.8 lb

## 2023-01-11 DIAGNOSIS — Z5111 Encounter for antineoplastic chemotherapy: Secondary | ICD-10-CM | POA: Diagnosis not present

## 2023-01-11 DIAGNOSIS — C50212 Malignant neoplasm of upper-inner quadrant of left female breast: Secondary | ICD-10-CM | POA: Diagnosis not present

## 2023-01-11 DIAGNOSIS — Z170 Estrogen receptor positive status [ER+]: Secondary | ICD-10-CM | POA: Diagnosis not present

## 2023-01-11 DIAGNOSIS — Z95828 Presence of other vascular implants and grafts: Secondary | ICD-10-CM

## 2023-01-11 LAB — CBC WITH DIFFERENTIAL/PLATELET
Abs Immature Granulocytes: 0.1 10*3/uL — ABNORMAL HIGH (ref 0.00–0.07)
Basophils Absolute: 0.1 10*3/uL (ref 0.0–0.1)
Basophils Relative: 1 %
Eosinophils Absolute: 0.3 10*3/uL (ref 0.0–0.5)
Eosinophils Relative: 4 %
HCT: 36.1 % (ref 36.0–46.0)
Hemoglobin: 11.5 g/dL — ABNORMAL LOW (ref 12.0–15.0)
Immature Granulocytes: 2 %
Lymphocytes Relative: 13 %
Lymphs Abs: 0.9 10*3/uL (ref 0.7–4.0)
MCH: 27.5 pg (ref 26.0–34.0)
MCHC: 31.9 g/dL (ref 30.0–36.0)
MCV: 86.4 fL (ref 80.0–100.0)
Monocytes Absolute: 0.4 10*3/uL (ref 0.1–1.0)
Monocytes Relative: 6 %
Neutro Abs: 5.1 10*3/uL (ref 1.7–7.7)
Neutrophils Relative %: 74 %
Platelets: 289 10*3/uL (ref 150–400)
RBC: 4.18 MIL/uL (ref 3.87–5.11)
RDW: 19 % — ABNORMAL HIGH (ref 11.5–15.5)
WBC: 6.8 10*3/uL (ref 4.0–10.5)
nRBC: 0 % (ref 0.0–0.2)

## 2023-01-11 LAB — COMPREHENSIVE METABOLIC PANEL
ALT: 100 U/L — ABNORMAL HIGH (ref 0–44)
AST: 52 U/L — ABNORMAL HIGH (ref 15–41)
Albumin: 3.7 g/dL (ref 3.5–5.0)
Alkaline Phosphatase: 82 U/L (ref 38–126)
Anion gap: 10 (ref 5–15)
BUN: 14 mg/dL (ref 6–20)
CO2: 21 mmol/L — ABNORMAL LOW (ref 22–32)
Calcium: 8.6 mg/dL — ABNORMAL LOW (ref 8.9–10.3)
Chloride: 104 mmol/L (ref 98–111)
Creatinine, Ser: 0.79 mg/dL (ref 0.44–1.00)
GFR, Estimated: >60 mL/min (ref >60)
Glucose, Bld: 159 mg/dL — ABNORMAL HIGH (ref 70–99)
Potassium: 3.4 mmol/L — ABNORMAL LOW (ref 3.5–5.1)
Sodium: 135 mmol/L (ref 135–145)
Total Bilirubin: 0.7 mg/dL (ref 0.3–1.2)
Total Protein: 6.9 g/dL (ref 6.5–8.1)

## 2023-01-11 LAB — MAGNESIUM: Magnesium: 2.1 mg/dL (ref 1.7–2.4)

## 2023-01-11 MED ORDER — SODIUM CHLORIDE 0.9 % IV SOLN
80.0000 mg/m2 | Freq: Once | INTRAVENOUS | Status: AC
  Administered 2023-01-11: 192 mg via INTRAVENOUS
  Filled 2023-01-11: qty 32

## 2023-01-11 MED ORDER — CETIRIZINE HCL 10 MG/ML IV SOLN
10.0000 mg | Freq: Once | INTRAVENOUS | Status: AC
  Administered 2023-01-11: 10 mg via INTRAVENOUS
  Filled 2023-01-11: qty 1

## 2023-01-11 MED ORDER — HEPARIN SOD (PORK) LOCK FLUSH 100 UNIT/ML IV SOLN
500.0000 [IU] | Freq: Once | INTRAVENOUS | Status: AC | PRN
  Administered 2023-01-11: 500 [IU]

## 2023-01-11 MED ORDER — FAMOTIDINE IN NACL 20-0.9 MG/50ML-% IV SOLN
20.0000 mg | Freq: Once | INTRAVENOUS | Status: AC
  Administered 2023-01-11: 20 mg via INTRAVENOUS
  Filled 2023-01-11: qty 50

## 2023-01-11 MED ORDER — SODIUM CHLORIDE 0.9 % IV SOLN
10.0000 mg | Freq: Once | INTRAVENOUS | Status: AC
  Administered 2023-01-11: 10 mg via INTRAVENOUS
  Filled 2023-01-11: qty 10

## 2023-01-11 MED ORDER — SODIUM CHLORIDE 0.9 % IV SOLN: Freq: Once | INTRAVENOUS | Status: AC

## 2023-01-11 MED ORDER — POTASSIUM CHLORIDE CRYS ER 20 MEQ PO TBCR
40.0000 meq | EXTENDED_RELEASE_TABLET | Freq: Once | ORAL | Status: AC
  Administered 2023-01-11: 40 meq via ORAL
  Filled 2023-01-11: qty 2

## 2023-01-11 MED ORDER — SODIUM CHLORIDE 0.9% FLUSH
10.0000 mL | INTRAVENOUS | Status: DC | PRN
  Administered 2023-01-11: 10 mL via INTRAVENOUS

## 2023-01-11 MED ORDER — ONDANSETRON HCL 4 MG/2ML IJ SOLN
8.0000 mg | Freq: Once | INTRAMUSCULAR | Status: AC
  Administered 2023-01-11: 8 mg via INTRAVENOUS
  Filled 2023-01-11: qty 4

## 2023-01-11 MED ORDER — SODIUM CHLORIDE 0.9% FLUSH
10.0000 mL | INTRAVENOUS | Status: DC | PRN
  Administered 2023-01-11: 10 mL

## 2023-01-11 NOTE — Patient Instructions (Signed)

## 2023-01-11 NOTE — Progress Notes (Signed)
Patient presents today for Paclitaxel infusion. Patient is in satisfactory condition with no new complaints voiced.  Vital signs are stable.  Labs reviewed by Dr. Ellin Saba during the office visit and all other labs are within treatment parameters. Pt's ALT is 100 today, Dr.K aware. Pt's potassium is 3.4 today, per Dr.K's standing order. Pt will receive 40 mEq potassium chloride p.o x 1 dose.We will proceed with treatment per MD orders.   Treatment given today per MD orders. Tolerated infusion without adverse affects. Vital signs stable. No complaints at this time. Discharged from clinic ambulatory in stable condition. Alert and oriented x 3. F/U with [facility] as scheduled.

## 2023-01-11 NOTE — Patient Instructions (Signed)
[facility]  Discharge Instructions: Thank you for choosing [facility] to provide your oncology and hematology care.  If you have a lab appointment with the Cancer Center - please note that after April 8th, 2024, all labs will be drawn in the cancer center.  You do not have to check in or register with the main entrance as you have in the past but will complete your check-in in the cancer center.  Wear comfortable clothing and clothing appropriate for easy access to any Portacath or PICC line.   We strive to give you quality time with your provider. You may need to reschedule your appointment if you arrive late (15 or more minutes).  Arriving late affects you and other patients whose appointments are after yours.  Also, if you miss three or more appointments without notifying the office, you may be dismissed from the clinic at the provider's discretion.      For prescription refill requests, have your pharmacy contact our office and allow 72 hours for refills to be completed.    Today you received the following chemotherapy and/or immunotherapy agents Paclitaxel     To help prevent nausea and vomiting after your treatment, we encourage you to take your nausea medication as directed.  Paclitaxel Injection What is this medication? PACLITAXEL (PAK li TAX el) treats some types of cancer. It works by slowing down the growth of cancer cells. This medicine may be used for other purposes; ask your health care provider or pharmacist if you have questions. COMMON BRAND NAME(S): Onxol, Taxol What should I tell my care team before I take this medication? They need to know if you have any of these conditions: Heart disease Liver disease Low white blood cell levels An unusual or allergic reaction to paclitaxel, other medications, foods, dyes, or preservatives If you or your partner are pregnant or trying to get pregnant Breast-feeding How should I use this  medication? This medication is injected into a vein. It is given by your care team in a hospital or clinic setting. Talk to your care team about the use of this medication in children. While it may be given to children for selected conditions, precautions do apply. Overdosage: If you think you have taken too much of this medicine contact a poison control center or emergency room at once. NOTE: This medicine is only for you. Do not share this medicine with others. What if I miss a dose? Keep appointments for follow-up doses. It is important not to miss your dose. Call your care team if you are unable to keep an appointment. What may interact with this medication? Do not take this medication with any of the following: Live virus vaccines Other medications may affect the way this medication works. Talk with your care team about all of the medications you take. They may suggest changes to your treatment plan to lower the risk of side effects and to make sure your medications work as intended. This list may not describe all possible interactions. Give your health care provider a list of all the medicines, herbs, non-prescription drugs, or dietary supplements you use. Also tell them if you smoke, drink alcohol, or use illegal drugs. Some items may interact with your medicine. What should I watch for while using this medication? Your condition will be monitored carefully while you are receiving this medication. You may need blood work while taking this medication. This medication may make you feel generally unwell. This is not  uncommon as chemotherapy can affect healthy cells as well as cancer cells. Report any side effects. Continue your course of treatment even though you feel ill unless your care team tells you to stop. This medication can cause serious allergic reactions. To reduce the risk, your care team may give you other medications to take before receiving this one. Be sure to follow the directions  from your care team. This medication may increase your risk of getting an infection. Call your care team for advice if you get a fever, chills, sore throat, or other symptoms of a cold or flu. Do not treat yourself. Try to avoid being around people who are sick. This medication may increase your risk to bruise or bleed. Call your care team if you notice any unusual bleeding. Be careful brushing or flossing your teeth or using a toothpick because you may get an infection or bleed more easily. If you have any dental work done, tell your dentist you are receiving this medication. Talk to your care team if you may be pregnant. Serious birth defects can occur if you take this medication during pregnancy. Talk to your care team before breastfeeding. Changes to your treatment plan may be needed. What side effects may I notice from receiving this medication? Side effects that you should report to your care team as soon as possible: Allergic reactions--skin rash, itching, hives, swelling of the face, lips, tongue, or throat Heart rhythm changes--fast or irregular heartbeat, dizziness, feeling faint or lightheaded, chest pain, trouble breathing Increase in blood pressure Infection--fever, chills, cough, sore throat, wounds that don't heal, pain or trouble when passing urine, general feeling of discomfort or being unwell Low blood pressure--dizziness, feeling faint or lightheaded, blurry vision Low red blood cell level--unusual weakness or fatigue, dizziness, headache, trouble breathing Painful swelling, warmth, or redness of the skin, blisters or sores at the infusion site Pain, tingling, or numbness in the hands or feet Slow heartbeat--dizziness, feeling faint or lightheaded, confusion, trouble breathing, unusual weakness or fatigue Unusual bruising or bleeding Side effects that usually do not require medical attention (report to your care team if they continue or are bothersome): Diarrhea Hair  loss Joint pain Loss of appetite Muscle pain Nausea Vomiting This list may not describe all possible side effects. Call your doctor for medical advice about side effects. You may report side effects to FDA at 1-800-FDA-1088. Where should I keep my medication? This medication is given in a hospital or clinic. It will not be stored at home. NOTE: This sheet is a summary. It may not cover all possible information. If you have questions about this medicine, talk to your doctor, pharmacist, or health care provider.  2024 Elsevier/Gold Standard (2021-11-01 00:00:00)   BELOW ARE SYMPTOMS THAT SHOULD BE REPORTED IMMEDIATELY: *FEVER GREATER THAN 100.4 F (38 C) OR HIGHER *CHILLS OR SWEATING *NAUSEA AND VOMITING THAT IS NOT CONTROLLED WITH YOUR NAUSEA MEDICATION *UNUSUAL SHORTNESS OF BREATH *UNUSUAL BRUISING OR BLEEDING *URINARY PROBLEMS (pain or burning when urinating, or frequent urination) *BOWEL PROBLEMS (unusual diarrhea, constipation, pain near the anus) TENDERNESS IN MOUTH AND THROAT WITH OR WITHOUT PRESENCE OF ULCERS (sore throat, sores in mouth, or a toothache) UNUSUAL RASH, SWELLING OR PAIN  UNUSUAL VAGINAL DISCHARGE OR ITCHING   Items with * indicate a potential emergency and should be followed up as soon as possible or go to the Emergency Department if any problems should occur.  Please show the CHEMOTHERAPY ALERT CARD or IMMUNOTHERAPY ALERT CARD at check-in to the Emergency  Department and triage nurse.  Should you have questions after your visit or need to cancel or reschedule your appointment, please contact [facility] [phone]  and follow the prompts.  Office hours are 8:00 a.m. to 4:30 p.m. Monday - Friday. Please note that voicemails left after 4:00 p.m. may not be returned until the following business day.  We are closed weekends and major holidays. You have access to a nurse at all times for urgent questions. Please call the main number to the clinic  [phone] and follow the prompts.  For any non-urgent questions, you may also contact your provider using MyChart. We now offer e-Visits for anyone 60 and older to request care online for non-urgent symptoms. For details visit [email or web address].   Also download the MyChart app! Go to the app store, search "MyChart", open the app, select [place], and log in with your MyChart username and password.

## 2023-01-11 NOTE — Progress Notes (Signed)
Patients port flushed without difficulty.  Good blood return noted with no bruising or swelling noted at site.  Patient remains accessed for treatment.  

## 2023-01-11 NOTE — Progress Notes (Signed)
Patient has been examined by Dr. Ellin Saba. Vital signs and labs have been reviewed by MD - ANC, Creatinine, LFTs (ALT 100), hemoglobin, and platelets are within treatment parameters per M.D. - pt may proceed with treatment.  Primary RN and pharmacy notified.

## 2023-01-16 ENCOUNTER — Telehealth: Payer: Self-pay | Admitting: Licensed Clinical Social Worker

## 2023-01-16 ENCOUNTER — Encounter: Payer: Self-pay | Admitting: Licensed Clinical Social Worker

## 2023-01-16 ENCOUNTER — Ambulatory Visit: Payer: Self-pay | Admitting: Licensed Clinical Social Worker

## 2023-01-16 DIAGNOSIS — Z1379 Encounter for other screening for genetic and chromosomal anomalies: Secondary | ICD-10-CM | POA: Insufficient documentation

## 2023-01-16 NOTE — Telephone Encounter (Signed)
I contacted Ms. Delashmit to discuss her genetic testing results. No pathogenic variants were identified in the 70 genes analyzed. Detailed clinic note to follow.   The test report has been scanned into EPIC and is located under the Molecular Pathology section of the Results Review tab.  A portion of the result report is included below for reference.      Lacy Duverney, MS, [facility] Genetic Counselor [email or web address] Phone: [phone]

## 2023-01-16 NOTE — Progress Notes (Signed)
HPI:   Ms. Shoff was previously seen in the [facility] due to a personal and family history of cancer and concerns regarding a hereditary predisposition to cancer. Please refer to our prior cancer genetics clinic note for more information regarding our discussion, assessment and recommendations, at the time. Ms. Badgett recent genetic test results were disclosed to her, as were recommendations warranted by these results. These results and recommendations are discussed in more detail below.  CANCER HISTORY:  Oncology History  Breast cancer of upper-inner quadrant of left female breast (HCC)  10/01/2022 Initial Diagnosis   Breast cancer of upper-inner quadrant of left female breast   10/01/2022 Cancer Staging   Staging form: Breast, AJCC 8th Edition - Clinical stage from 10/01/2022: Stage IB (cT1c, cN1, cM0, G2, ER+, PR+, HER2-) - Signed by [name], MD on 10/01/2022 Histopathologic type: Infiltrating duct carcinoma, NOS Stage prefix: Initial diagnosis Nuclear grade: G2 Histologic grading system: 3 grade system   10/25/2022 -  Chemotherapy   Patient is on Treatment Plan : BREAST ADJUVANT DOSE DENSE AC q14d / PACLitaxel q7d      Genetic Testing   No pathogenic variants identified on the [facility] Common Hereditary Cancers+RNA panel. VUS in PDGFRA called c.502G>C identified. The report date is 01/16/2023.  The Common Hereditary Cancers Panel + RNA offered by [facility] includes sequencing and/or deletion duplication testing of the following 48 genes: APC*, ATM*, AXIN2, BAP1, BARD1, BMPR1A, BRCA1, BRCA2, BRIP1, CDH1, CDK4, CDKN2A (p14ARF), CDKN2A (p16INK4a), CHEK2, CTNNA1, DICER1*, EPCAM*, FH*, GREM1*, HOXB13, KIT, MBD4, MEN1*, MLH1*, MSH2*, MSH3*, MSH6*, MUTYH, NF1*, NTHL1, PALB2, PDGFRA, PMS2*, POLD1*, POLE, PTEN*, RAD51C, RAD51D, SDHA*, SDHB, SDHC*, SDHD, SMAD4, SMARCA4, STK11, TP53, TSC1*, TSC2, VHL.      FAMILY HISTORY:  We obtained a detailed, 4-generation family  history.  Significant diagnoses are listed below: Family History  Problem Relation Age of Onset   Breast cancer Mother 57   Arthritis Mother    Hypertension Mother    Osteoporosis Mother    Thyroid disease Father    Hypertension Father    Skin cancer Father    Mental illness Sister        depression ? bipolar   Dementia Maternal Grandmother    Prostate cancer Maternal Grandfather        prostate   Heart disease Paternal Grandmother    Colon cancer Paternal Grandfather    Mental illness Daughter    Pancreatic cancer Cousin        maternal cousin   Cancer Cousin        paternal, unknown type   Cancer Cousin        paternal, unknown type   Cancer - Lung Neg Hx    Cervical cancer Neg Hx    Ms. Milo has 1 son, 28, and 1 daughter, 71. She has 1 brother and 1 sister, no cancers.    Ms. Ketcher mother had breast cancer at 61 and is living at 69. A maternal cousin died of pancreatic cancer in his 63s. Maternal grandfather had prostate cancer over age 51 and possibly passed from it.   Ms. Mol father had skin cancer, sun exposure, he is living at 60. A paternal uncle had prostate cancer. Paternal aunt had skin cancer. A paternal aunt had cancer/tumor in her ear and is living at 63. Two paternal cousins had unknown types of cancer, one died in his 53s. Paternal grandfather had colon cancer and died over age 46.    Ms. Vickroy  is unaware of previous family history of genetic testing for hereditary cancer risks. There is no reported Ashkenazi Jewish ancestry. There is no known consanguinity.      GENETIC TEST RESULTS:  The [facility] Multi-Cancer+RNA Panel found no pathogenic mutations.   The Multi-Cancer + RNA Panel offered by [facility] includes sequencing and/or deletion/duplication analysis of the following 70 genes:  AIP*, ALK, APC*, ATM*, AXIN2*, BAP1*, BARD1*, BLM*, BMPR1A*, BRCA1*, BRCA2*, BRIP1*, CDC73*, CDH1*, CDK4, CDKN1B*, CDKN2A, CHEK2*, CTNNA1*, DICER1*, EPCAM, EGFR, FH*, FLCN*,  GREM1, HOXB13, KIT, LZTR1, MAX*, MBD4, MEN1*, MET, MITF, MLH1*, MSH2*, MSH3*, MSH6*, MUTYH*, NF1*, NF2*, NTHL1*, PALB2*, PDGFRA, PMS2*, POLD1*, POLE*, POT1*, PRKAR1A*, PTCH1*, PTEN*, RAD51C*, RAD51D*, RB1*, RET, SDHA*, SDHAF2*, SDHB*, SDHC*, SDHD*, SMAD4*, SMARCA4*, SMARCB1*, SMARCE1*, STK11*, SUFU*, TMEM127*, TP53*, TSC1*, TSC2*, VHL*. RNA analysis is performed for * genes.   The test report has been scanned into EPIC and is located under the Molecular Pathology section of the Results Review tab.  A portion of the result report is included below for reference. Genetic testing reported out on 01/13/2023.      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  At this time, it is unknown if this variant is associated with an increased risk for cancer or if it is benign, but most uncertain variants are reclassified to benign. It should not be used to make medical management decisions. With time, we suspect the laboratory will determine the significance of this variant, if any. If the laboratory reclassifies this variant, we will attempt to contact Ms. Dupuy to discuss it further.   Even though a pathogenic variant was not identified, possible explanations for the cancer in the family may include: There may be no hereditary risk for cancer in the family. The cancers in Ms. Hodgkin and/or her family may be sporadic/familial or due to other genetic and environmental factors. There may be a gene mutation in one of these genes that current testing methods cannot detect but that chance is small. There could be another gene that has not yet been discovered, or that we have not yet tested, that is responsible for the cancer diagnoses in the family.  It is also possible there is a hereditary cause for the cancer in the family that Ms. Mancinelli did not inherit.  Therefore, it is important to remain in touch with cancer genetics in the future so that we can continue to offer Ms. Rask  the most up to date genetic testing.   ADDITIONAL GENETIC TESTING:  We discussed with Ms. Reno that her genetic testing was fairly extensive.  If there are additional relevant genes identified to increase cancer risk that can be analyzed in the future, we would be happy to discuss and coordinate this testing at that time.    CANCER SCREENING RECOMMENDATIONS:  Ms. Moilanen test result is considered negative (normal).  This means that we have not identified a hereditary cause for her personal and family history of cancer at this time.   An individual's cancer risk and medical management are not determined by genetic test results alone. Overall cancer risk assessment incorporates additional factors, including personal medical history, family history, and any available genetic information that may result in a personalized plan for cancer prevention and surveillance. Therefore, it is recommended she continue to follow the cancer management and screening guidelines provided by her oncology and primary healthcare provider.  RECOMMENDATIONS FOR FAMILY MEMBERS:   Since she did not inherit a identifiable mutation in a  cancer predisposition gene included on this panel, her children could not have inherited a known mutation from her in one of these genes. Individuals in this family might be at some increased risk of developing cancer, over the general population risk, due to the family history of cancer.  Individuals in the family should notify their providers of the family history of cancer. We recommend women in this family have a yearly mammogram beginning at age 55, or 24 years younger than the earliest onset of cancer, an annual clinical breast exam, and perform monthly breast self-exams.  Family members should have colonoscopies by at age 15, or earlier, as recommended by their providers. Other members of the family may still carry a pathogenic variant in one of these genes that Ms. Whitelaw did not inherit. Based  on the family history, we recommend those related to her cousin who had pancreatic cancer, have genetic counseling and testing. Ms. Profitt will let us know if we can be of any assistance in coordinating genetic counseling and/or testing for this family member.   We do not recommend familial testing for the PDGFRA variant of uncertain significance (VUS).  FOLLOW-UP:  Lastly, we discussed with Ms. Dias that cancer genetics is a rapidly advancing field and it is possible that new genetic tests will be appropriate for her and/or her family members in the future. We encouraged her to remain in contact with cancer genetics on an annual basis so we can update her personal and family histories and let her know of advances in cancer genetics that may benefit this family.   Our contact number was provided. Ms. Poehler questions were answered to her satisfaction, and she knows she is welcome to call us at anytime with additional questions or concerns.    Lacy Duverney, MS, [facility] Genetic Counselor [email or web address] Phone: [phone]

## 2023-01-17 ENCOUNTER — Other Ambulatory Visit: Payer: Self-pay

## 2023-01-18 ENCOUNTER — Inpatient Hospital Stay: Payer: Managed Care, Other (non HMO) | Admitting: Hematology

## 2023-01-18 ENCOUNTER — Inpatient Hospital Stay: Payer: Managed Care, Other (non HMO)

## 2023-01-18 VITALS — BP 118/79 | HR 85 | Temp 97.3°F | Resp 20

## 2023-01-18 DIAGNOSIS — Z170 Estrogen receptor positive status [ER+]: Secondary | ICD-10-CM

## 2023-01-18 DIAGNOSIS — Z5111 Encounter for antineoplastic chemotherapy: Secondary | ICD-10-CM | POA: Diagnosis not present

## 2023-01-18 LAB — CBC WITH DIFFERENTIAL/PLATELET
Abs Immature Granulocytes: 0.36 10*3/uL — ABNORMAL HIGH (ref 0.00–0.07)
Basophils Absolute: 0.1 10*3/uL (ref 0.0–0.1)
Basophils Relative: 2 %
Eosinophils Absolute: 0.5 10*3/uL (ref 0.0–0.5)
Eosinophils Relative: 6 %
HCT: 36.8 % (ref 36.0–46.0)
Hemoglobin: 11.8 g/dL — ABNORMAL LOW (ref 12.0–15.0)
Immature Granulocytes: 4 %
Lymphocytes Relative: 11 %
Lymphs Abs: 0.9 10*3/uL (ref 0.7–4.0)
MCH: 27.8 pg (ref 26.0–34.0)
MCHC: 32.1 g/dL (ref 30.0–36.0)
MCV: 86.6 fL (ref 80.0–100.0)
Monocytes Absolute: 0.4 10*3/uL (ref 0.1–1.0)
Monocytes Relative: 5 %
Neutro Abs: 5.9 10*3/uL (ref 1.7–7.7)
Neutrophils Relative %: 72 %
Platelets: 292 10*3/uL (ref 150–400)
RBC: 4.25 MIL/uL (ref 3.87–5.11)
RDW: 19.2 % — ABNORMAL HIGH (ref 11.5–15.5)
WBC: 8.2 10*3/uL (ref 4.0–10.5)
nRBC: 0 % (ref 0.0–0.2)

## 2023-01-18 LAB — COMPREHENSIVE METABOLIC PANEL
ALT: 101 U/L — ABNORMAL HIGH (ref 0–44)
AST: 44 U/L — ABNORMAL HIGH (ref 15–41)
Albumin: 3.8 g/dL (ref 3.5–5.0)
Alkaline Phosphatase: 91 U/L (ref 38–126)
Anion gap: 10 (ref 5–15)
BUN: 13 mg/dL (ref 6–20)
CO2: 23 mmol/L (ref 22–32)
Calcium: 8.6 mg/dL — ABNORMAL LOW (ref 8.9–10.3)
Chloride: 103 mmol/L (ref 98–111)
Creatinine, Ser: 0.73 mg/dL (ref 0.44–1.00)
GFR, Estimated: >60 mL/min (ref >60)
Glucose, Bld: 137 mg/dL — ABNORMAL HIGH (ref 70–99)
Potassium: 3.8 mmol/L (ref 3.5–5.1)
Sodium: 136 mmol/L (ref 135–145)
Total Bilirubin: 0.8 mg/dL (ref 0.3–1.2)
Total Protein: 7.2 g/dL (ref 6.5–8.1)

## 2023-01-18 LAB — MAGNESIUM: Magnesium: 2.1 mg/dL (ref 1.7–2.4)

## 2023-01-18 MED ORDER — HEPARIN SOD (PORK) LOCK FLUSH 100 UNIT/ML IV SOLN
500.0000 [IU] | Freq: Once | INTRAVENOUS | Status: AC | PRN
  Administered 2023-01-18: 500 [IU]

## 2023-01-18 MED ORDER — SODIUM CHLORIDE 0.9 % IV SOLN: Freq: Once | INTRAVENOUS | Status: AC

## 2023-01-18 MED ORDER — CETIRIZINE HCL 10 MG/ML IV SOLN
10.0000 mg | Freq: Once | INTRAVENOUS | Status: AC
  Administered 2023-01-18: 10 mg via INTRAVENOUS
  Filled 2023-01-18: qty 1

## 2023-01-18 MED ORDER — SODIUM CHLORIDE 0.9 % IV SOLN
10.0000 mg | Freq: Once | INTRAVENOUS | Status: AC
  Administered 2023-01-18: 10 mg via INTRAVENOUS
  Filled 2023-01-18: qty 1

## 2023-01-18 MED ORDER — SODIUM CHLORIDE 0.9% FLUSH
10.0000 mL | INTRAVENOUS | Status: DC | PRN
  Administered 2023-01-18: 10 mL via INTRAVENOUS

## 2023-01-18 MED ORDER — SODIUM CHLORIDE 0.9% FLUSH
10.0000 mL | INTRAVENOUS | Status: DC | PRN
  Administered 2023-01-18: 10 mL

## 2023-01-18 MED ORDER — SODIUM CHLORIDE 0.9 % IV SOLN
80.0000 mg/m2 | Freq: Once | INTRAVENOUS | Status: AC
  Administered 2023-01-18: 192 mg via INTRAVENOUS
  Filled 2023-01-18: qty 32

## 2023-01-18 MED ORDER — FAMOTIDINE IN NACL 20-0.9 MG/50ML-% IV SOLN
20.0000 mg | Freq: Once | INTRAVENOUS | Status: AC
  Administered 2023-01-18: 20 mg via INTRAVENOUS
  Filled 2023-01-18: qty 50

## 2023-01-18 MED ORDER — ONDANSETRON HCL 4 MG/2ML IJ SOLN
8.0000 mg | Freq: Once | INTRAMUSCULAR | Status: AC
  Administered 2023-01-18: 8 mg via INTRAVENOUS
  Filled 2023-01-18: qty 4

## 2023-01-18 NOTE — Progress Notes (Signed)
Ok to proceed with today's labs.  Dr Ellin Saba is aware of elevated ALT.  Dr Carilyn Goodpasture, PharmD

## 2023-01-18 NOTE — Patient Instructions (Signed)
[facility]  Discharge Instructions: Thank you for choosing [facility] to provide your oncology and hematology care.  If you have a lab appointment with the Cancer Center - please note that after April 8th, 2024, all labs will be drawn in the cancer center.  You do not have to check in or register with the main entrance as you have in the past but will complete your check-in in the cancer center.  Wear comfortable clothing and clothing appropriate for easy access to any Portacath or PICC line.   We strive to give you quality time with your provider. You may need to reschedule your appointment if you arrive late (15 or more minutes).  Arriving late affects you and other patients whose appointments are after yours.  Also, if you miss three or more appointments without notifying the office, you may be dismissed from the clinic at the provider's discretion.      For prescription refill requests, have your pharmacy contact our office and allow 72 hours for refills to be completed.    Today you received the following chemotherapy and/or immunotherapy agents Taxol    To help prevent nausea and vomiting after your treatment, we encourage you to take your nausea medication as directed.  Paclitaxel Injection What is this medication? PACLITAXEL (PAK li TAX el) treats some types of cancer. It works by slowing down the growth of cancer cells. This medicine may be used for other purposes; ask your health care provider or pharmacist if you have questions. COMMON BRAND NAME(S): Onxol, Taxol What should I tell my care team before I take this medication? They need to know if you have any of these conditions: Heart disease Liver disease Low white blood cell levels An unusual or allergic reaction to paclitaxel, other medications, foods, dyes, or preservatives If you or your partner are pregnant or trying to get pregnant Breast-feeding How should I use this  medication? This medication is injected into a vein. It is given by your care team in a hospital or clinic setting. Talk to your care team about the use of this medication in children. While it may be given to children for selected conditions, precautions do apply. Overdosage: If you think you have taken too much of this medicine contact a poison control center or emergency room at once. NOTE: This medicine is only for you. Do not share this medicine with others. What if I miss a dose? Keep appointments for follow-up doses. It is important not to miss your dose. Call your care team if you are unable to keep an appointment. What may interact with this medication? Do not take this medication with any of the following: Live virus vaccines Other medications may affect the way this medication works. Talk with your care team about all of the medications you take. They may suggest changes to your treatment plan to lower the risk of side effects and to make sure your medications work as intended. This list may not describe all possible interactions. Give your health care provider a list of all the medicines, herbs, non-prescription drugs, or dietary supplements you use. Also tell them if you smoke, drink alcohol, or use illegal drugs. Some items may interact with your medicine. What should I watch for while using this medication? Your condition will be monitored carefully while you are receiving this medication. You may need blood work while taking this medication. This medication may make you feel generally unwell. This is not uncommon  as chemotherapy can affect healthy cells as well as cancer cells. Report any side effects. Continue your course of treatment even though you feel ill unless your care team tells you to stop. This medication can cause serious allergic reactions. To reduce the risk, your care team may give you other medications to take before receiving this one. Be sure to follow the directions  from your care team. This medication may increase your risk of getting an infection. Call your care team for advice if you get a fever, chills, sore throat, or other symptoms of a cold or flu. Do not treat yourself. Try to avoid being around people who are sick. This medication may increase your risk to bruise or bleed. Call your care team if you notice any unusual bleeding. Be careful brushing or flossing your teeth or using a toothpick because you may get an infection or bleed more easily. If you have any dental work done, tell your dentist you are receiving this medication. Talk to your care team if you may be pregnant. Serious birth defects can occur if you take this medication during pregnancy. Talk to your care team before breastfeeding. Changes to your treatment plan may be needed. What side effects may I notice from receiving this medication? Side effects that you should report to your care team as soon as possible: Allergic reactions--skin rash, itching, hives, swelling of the face, lips, tongue, or throat Heart rhythm changes--fast or irregular heartbeat, dizziness, feeling faint or lightheaded, chest pain, trouble breathing Increase in blood pressure Infection--fever, chills, cough, sore throat, wounds that don't heal, pain or trouble when passing urine, general feeling of discomfort or being unwell Low blood pressure--dizziness, feeling faint or lightheaded, blurry vision Low red blood cell level--unusual weakness or fatigue, dizziness, headache, trouble breathing Painful swelling, warmth, or redness of the skin, blisters or sores at the infusion site Pain, tingling, or numbness in the hands or feet Slow heartbeat--dizziness, feeling faint or lightheaded, confusion, trouble breathing, unusual weakness or fatigue Unusual bruising or bleeding Side effects that usually do not require medical attention (report to your care team if they continue or are bothersome): Diarrhea Hair  loss Joint pain Loss of appetite Muscle pain Nausea Vomiting This list may not describe all possible side effects. Call your doctor for medical advice about side effects. You may report side effects to FDA at 1-800-FDA-1088. Where should I keep my medication? This medication is given in a hospital or clinic. It will not be stored at home. NOTE: This sheet is a summary. It may not cover all possible information. If you have questions about this medicine, talk to your doctor, pharmacist, or health care provider.  2024 Elsevier/Gold Standard (2021-11-01 00:00:00)   BELOW ARE SYMPTOMS THAT SHOULD BE REPORTED IMMEDIATELY: *FEVER GREATER THAN 100.4 F (38 C) OR HIGHER *CHILLS OR SWEATING *NAUSEA AND VOMITING THAT IS NOT CONTROLLED WITH YOUR NAUSEA MEDICATION *UNUSUAL SHORTNESS OF BREATH *UNUSUAL BRUISING OR BLEEDING *URINARY PROBLEMS (pain or burning when urinating, or frequent urination) *BOWEL PROBLEMS (unusual diarrhea, constipation, pain near the anus) TENDERNESS IN MOUTH AND THROAT WITH OR WITHOUT PRESENCE OF ULCERS (sore throat, sores in mouth, or a toothache) UNUSUAL RASH, SWELLING OR PAIN  UNUSUAL VAGINAL DISCHARGE OR ITCHING   Items with * indicate a potential emergency and should be followed up as soon as possible or go to the Emergency Department if any problems should occur.  Please show the CHEMOTHERAPY ALERT CARD or IMMUNOTHERAPY ALERT CARD at check-in to the Emergency Department  and triage nurse.  Should you have questions after your visit or need to cancel or reschedule your appointment, please contact [facility] [phone]  and follow the prompts.  Office hours are 8:00 a.m. to 4:30 p.m. Monday - Friday. Please note that voicemails left after 4:00 p.m. may not be returned until the following business day.  We are closed weekends and major holidays. You have access to a nurse at all times for urgent questions. Please call the main number to the clinic  [phone] and follow the prompts.  For any non-urgent questions, you may also contact your provider using MyChart. We now offer e-Visits for anyone 61 and older to request care online for non-urgent symptoms. For details visit [email or web address].   Also download the MyChart app! Go to the app store, search "MyChart", open the app, select [place], and log in with your MyChart username and password.

## 2023-01-18 NOTE — Progress Notes (Signed)
Patient presents today for Taxol infusion. Patient is in satisfactory condition with new complaints voiced. Patient c/o numbness and tingling in her hands and c/o having heart palpations three days this week. Dr.K made aware and stated to proceed with current dose of Taxol. We will reevaluate next week and dose-reduce if needed per Dr.K. Vital signs are stable.  Labs reviewed and all other labs are within treatment parameters. Pt's ALT is 101, Dr.K made aware. We will proceed with treatment per MD orders.    Treatment given today per MD orders. Tolerated infusion without adverse affects. Vital signs stable. No complaints at this time. Discharged from clinic ambulatory in stable condition. Alert and oriented x 3. F/U with [facility] as scheduled.

## 2023-01-23 ENCOUNTER — Other Ambulatory Visit: Payer: Self-pay

## 2023-01-24 NOTE — Progress Notes (Signed)
[facility] [address] [place] [address]    Clinic Day:  01/25/2023  Referring physician: [name], MD  Patient Care Team: [name], MD as PCP - General (Internal Medicine) [name], MD as Consulting Physician (Hematology)   ASSESSMENT & PLAN:   Assessment: 1.  T1cN1 G2 left breast IDC, ER/PR positive, HER2 negative: - She felt a lump in her breast in December 2023. - Left breast subareolar mass at 9:00 biopsy (08/10/2022): grade 2 IDC, ER 95% strong intensity, PR 90% strong intensity, Ki-67 10%, HER2 1+ - Left mastectomy and SLNB (08/28/2022): 1.6 x 1.2 x 0.7 cm IDC, margins negative, LVI present, 1/1 lymph node with metastatic carcinoma, metastasis 15 mm, focal extracapsular extension.  PT1 cpN1a. 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  No prior history of arterial/venous thrombosis/strokes. - PET scan (10/19/2022): No evidence of hypermetabolic metastatic disease.  Diffuse hepatic steatosis. - 4 cycles of adjuvant AC from 10/25/2022 through 12/12/2022.  Weekly paclitaxel started on 12/27/2022.   2.  Social/family history: - Seen today with her husband and daughter.  She worked as a Conservation officer, nature.  Current active smoker, 5 to 7 cigarettes/day, started at age 44. - Mother had breast cancer at age 73.  Father had skin cancer.  Paternal grandfather had colon cancer.  Maternal grandfather had prostate cancer.  1 maternal cousin had pancreatic cancer.  2 paternal cousins have cancer.    Plan: 1.  T1cN1 G2 left breast IDC, ER/PR positive, HER2 negative: - She has received 2 weekly doses of paclitaxel.  She has occasional nausea but not requiring Compazine.  No signs of peripheral neuropathy. - Reviewed labs today: Normal creatinine.  LFTs show stable elevated AST and ALT.  CBC grossly normal. - Continue weekly paclitaxel without any dose reduction.  RTC 3 weeks for follow-up and toxicity assessment.   2.  Elevated LFTs: -  Mildly elevated AST and ALT are stable at 50-100 respectively.   3.  High risk drug monitoring: - 2D echo on 10/23/2022 with EF 55 to 60%.  No signs of PND or orthopnea.   4.  Low vitamin D levels: - Last vitamin D was 23.  Continue vitamin D 1000 units daily.    No orders of the defined types were placed in this encounter.    Alben Deeds Teague,acting as a Neurosurgeon for [name], MD.,have documented all relevant documentation on the behalf of [name], MD,as directed by  [name], MD while in the presence of [name], MD.*  ***    Zalma R Teague   7/31/20249:33 PM  CHIEF COMPLAINT:   Diagnosis: left breast cancer    Cancer Staging  Breast cancer of upper-inner quadrant of left female breast [facility]) Staging form: Breast, AJCC 8th Edition - Clinical stage from 10/01/2022: Stage IB (cT1c, cN1, cM0, G2, ER+, PR+, HER2-) - Signed by [name], MD on 10/01/2022    Prior Therapy: 1. left mastectomy and +SLNB with Dr. Algis Greenhouse on 08/28/22  2. Adjuvant AC, 4 cycles, 10/25/22 - 12/12/22  Current Therapy:  weekly paclitaxel    HISTORY OF PRESENT ILLNESS:   Oncology History  Breast cancer of upper-inner quadrant of left female breast (HCC)  10/01/2022 Initial Diagnosis   Breast cancer of upper-inner quadrant of left female breast   10/01/2022 Cancer Staging   Staging form: Breast, AJCC 8th Edition - Clinical stage from 10/01/2022: Stage IB (cT1c, cN1, cM0, G2, ER+, PR+, HER2-) -  Signed by [name], MD on 10/01/2022 Histopathologic type: Infiltrating duct carcinoma, NOS Stage prefix: Initial diagnosis Nuclear grade: G2 Histologic grading system: 3 grade system   10/25/2022 -  Chemotherapy   Patient is on Treatment Plan : BREAST ADJUVANT DOSE DENSE AC q14d / PACLitaxel q7d      Genetic Testing   No pathogenic variants identified on the [facility] Common Hereditary Cancers+RNA panel. VUS in PDGFRA called c.502G>C identified. The  report date is 01/16/2023.  The Common Hereditary Cancers Panel + RNA offered by [facility] includes sequencing and/or deletion duplication testing of the following 48 genes: APC*, ATM*, AXIN2, BAP1, BARD1, BMPR1A, BRCA1, BRCA2, BRIP1, CDH1, CDK4, CDKN2A (p14ARF), CDKN2A (p16INK4a), CHEK2, CTNNA1, DICER1*, EPCAM*, FH*, GREM1*, HOXB13, KIT, MBD4, MEN1*, MLH1*, MSH2*, MSH3*, MSH6*, MUTYH, NF1*, NTHL1, PALB2, PDGFRA, PMS2*, POLD1*, POLE, PTEN*, RAD51C, RAD51D, SDHA*, SDHB, SDHC*, SDHD, SMAD4, SMARCA4, STK11, TP53, TSC1*, TSC2, VHL.       INTERVAL HISTORY:   Diana Jensen is a 47 y.o. female presenting to clinic today for follow up of left breast cancer. She was last seen by me on 01/11/23.  Today, she states that she is doing well overall. Her appetite level is at **%. Her energy level is at ***%.  PAST MEDICAL HISTORY:   Past Medical History: Past Medical History:  Diagnosis Date   Allergy    seasonal   Antiphospholipid syndrome (HCC)    Anxiety    Arthritis    deterioration of spine, knees   Carpal tunnel syndrome of right wrist    Clotting disorder (HCC)    antiphospholipid syndrome   Depression    Encounter for general adult medical examination with abnormal findings 07/03/2022   Essential hypertension, benign 02/10/2020   Hyperlipidemia 08/02/2022   Sleep disorder 02/22/2016    Surgical History: Past Surgical History:  Procedure Laterality Date   BREAST BIOPSY Left 08/10/2022   [place] LT BREAST BX W LOC DEV 1ST LESION IMG BX SPEC US GUIDE 08/10/2022 [facility]   BREAST BIOPSY Left 08/18/2022   MM LT BREAST BX W LOC DEV EA AD LESION IMG BX SPEC STEREO GUIDE 08/18/2022 [facility]   BREAST BIOPSY Left 08/18/2022   MM LT BREAST BX W LOC DEV 1ST LESION IMAGE BX SPEC STEREO GUIDE 08/18/2022 [facility]   CARPAL TUNNEL RELEASE Right 01/09/2019   Procedure: RIGHT CARPAL TUNNEL RELEASE;  Surgeon: Vickki Hearing, MD;  Location: [facility];  Service: Orthopedics;  Laterality: Right;    DILATION AND CURETTAGE OF UTERUS     x2   EYE SURGERY     lazy eye   IRRIGATION AND DEBRIDEMENT HEMATOMA Left 10/05/2022   Procedure: HEMATOMA EVACUATION OF LEFT BREAST WITH JP DRAIN PLACEMENT;  Surgeon: [name], MD;  Location: [facility];  Service: General;  Laterality: Left;   PARTIAL HYSTERECTOMY  2011   PORTACATH PLACEMENT Right 10/05/2022   Procedure: INSERTION PORT-A-CATH;  Surgeon: [name], MD;  Location: [facility];  Service: General;  Laterality: Right;   SIMPLE MASTECTOMY WITH AXILLARY SENTINEL NODE BIOPSY Left 08/28/2022   Procedure: SIMPLE MASTECTOMY WITH AXILLARY SENTINEL NODE BIOPSY;  Surgeon: [name], MD;  Location: [facility];  Service: General;  Laterality: Left;    Social History: Social History   Socioeconomic History   Marital status: Married    Spouse name: Not on file   Number of children: 2   Years of education: Not on file   Highest education level: Not on file  Occupational History  Not on file  Tobacco Use   Smoking status: Every Day    Current packs/day: 0.50    Average packs/day: 0.5 packs/day for 28.0 years (14.0 ttl pk-yrs)    Types: Cigarettes   Smokeless tobacco: Never   Tobacco comments:    She has been smoking since age 81, smokes half a pack daily,was doing one pack a day until last year   Vaping Use   Vaping status: Never Used  Substance and Sexual Activity   Alcohol use: No   Drug use: No   Sexual activity: Yes    Birth control/protection: Surgical  Other Topics Concern   Not on file  Social History Narrative   Associates degree in Engineer, site. 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 Has 2 children 1 son and 1 daughter. Married x 15 years.   Social Determinants of Health   Financial Resource Strain: High Risk (11/09/2022)   Overall Financial Resource Strain (CARDIA)    Difficulty of Paying Living Expenses: Hard  Food Insecurity: No Food Insecurity (08/30/2022)   Hunger Vital Sign     Worried About Running Out of Food in the Last Year: Never true    Ran Out of Food in the Last Year: Never true  Transportation Needs: No Transportation Needs (08/30/2022)   PRAPARE - Administrator, Civil Service (Medical): No    Lack of Transportation (Non-Medical): No  Physical Activity: Not on file  Stress: Not on file  Social Connections: Not on file  Intimate Partner Violence: Not At Risk (08/30/2022)   Humiliation, Afraid, Rape, and Kick questionnaire    Fear of Current or Ex-Partner: No    Emotionally Abused: No    Physically Abused: No    Sexually Abused: No    Family History: Family History  Problem Relation Age of Onset   Breast cancer Mother 29   Arthritis Mother    Hypertension Mother    Osteoporosis Mother    Thyroid disease Father    Hypertension Father    Skin cancer Father    Mental illness Sister        depression ? bipolar   Dementia Maternal Grandmother    Prostate cancer Maternal Grandfather        prostate   Heart disease Paternal Grandmother    Colon cancer Paternal Grandfather    Mental illness Daughter    Pancreatic cancer Cousin        maternal cousin   Cancer Cousin        paternal, unknown type   Cancer Cousin        paternal, unknown type   Cancer - Lung Neg Hx    Cervical cancer Neg Hx     Current Medications:  Current Outpatient Medications:    acetaminophen (TYLENOL) 500 MG tablet, Take 1,000 mg by mouth every 6 (six) hours as needed for headache., Disp: , Rfl:    Cholecalciferol (VITAMIN D-3) 25 MCG (1000 UT) CAPS, Take 1 capsule by mouth daily., Disp: , Rfl:    cycloPHOSphamide (CYTOXAN IJ), Inject as directed., Disp: , Rfl:    DOXOrubicin HCl (ADRIAMYCIN IV), Inject into the vein., Disp: , Rfl:    escitalopram (LEXAPRO) 10 MG tablet, Take 1 tablet (10 mg total) by mouth daily., Disp: 90 tablet, Rfl: 0   hydrOXYzine (VISTARIL) 50 MG capsule, Take 1 capsule (50 mg total) by mouth daily as needed for anxiety., Disp: 30  capsule, Rfl: 2   lidocaine-prilocaine (EMLA) cream,  Apply to affected area once, Disp: 30 g, Rfl: 3   loratadine (CLARITIN) 10 MG tablet, Take 10 mg by mouth daily., Disp: , Rfl:    losartan (COZAAR) 25 MG tablet, Take 1 tablet (25 mg total) by mouth daily., Disp: 90 tablet, Rfl: 1   ondansetron (ZOFRAN-ODT) 4 MG disintegrating tablet, Take 1 tablet (4 mg total) by mouth every 6 (six) hours as needed for nausea., Disp: 20 tablet, Rfl: 0   oxyCODONE (OXY IR/ROXICODONE) 5 MG immediate release tablet, Take 1-2 tablets (5-10 mg total) by mouth every 4 (four) hours as needed for severe pain or breakthrough pain., Disp: 15 tablet, Rfl: 0   pegfilgrastim-cbqv (UDENYCA) 6 MG/0.6ML injection, Administer 6 mg (0.6 ml) subcutaneously every 14 days on Day 3 of each treatment cycle - to be administered by a Designer, jewellery, Disp: 0.6 mL, Rfl: 3   prochlorperazine (COMPAZINE) 10 MG tablet, TAKE ONE TABLET BY MOUTH EVERY 6 HOURS AS NEEDED FOR NAUSEA AND VOMITING, Disp: 60 tablet, Rfl: 3   Allergies: No Known Allergies  REVIEW OF SYSTEMS:   Review of Systems  Constitutional:  Negative for chills, fatigue and fever.  HENT:   Negative for lump/mass, mouth sores, nosebleeds, sore throat and trouble swallowing.   Eyes:  Negative for eye problems.  Respiratory:  Negative for cough and shortness of breath.   Cardiovascular:  Negative for chest pain, leg swelling and palpitations.  Gastrointestinal:  Negative for abdominal pain, constipation, diarrhea, nausea and vomiting.  Genitourinary:  Negative for bladder incontinence, difficulty urinating, dysuria, frequency, hematuria and nocturia.   Musculoskeletal:  Negative for arthralgias, back pain, flank pain, myalgias and neck pain.  Skin:  Negative for itching and rash.  Neurological:  Negative for dizziness, headaches and numbness.  Hematological:  Does not bruise/bleed easily.  Psychiatric/Behavioral:  Negative for depression, sleep disturbance and suicidal  ideas. The patient is not nervous/anxious.   All other systems reviewed and are negative.    VITALS:   There were no vitals taken for this visit.  Wt Readings from Last 3 Encounters:  01/18/23 262 lb (118.8 kg)  01/11/23 261 lb 12.8 oz (118.8 kg)  01/03/23 260 lb 6.4 oz (118.1 kg)    There is no height or weight on file to calculate BMI.  Performance status (ECOG): 1 - Symptomatic but completely ambulatory  PHYSICAL EXAM:   Physical Exam Vitals and nursing note reviewed. Exam conducted with a chaperone present.  Constitutional:      Appearance: Normal appearance.  Cardiovascular:     Rate and Rhythm: Normal rate and regular rhythm.     Pulses: Normal pulses.     Heart sounds: Normal heart sounds.  Pulmonary:     Effort: Pulmonary effort is normal.     Breath sounds: Normal breath sounds.  Abdominal:     Palpations: Abdomen is soft. There is no hepatomegaly, splenomegaly or mass.     Tenderness: There is no abdominal tenderness.  Musculoskeletal:     Right lower leg: No edema.     Left lower leg: No edema.  Lymphadenopathy:     Cervical: No cervical adenopathy.     Right cervical: No superficial, deep or posterior cervical adenopathy.    Left cervical: No superficial, deep or posterior cervical adenopathy.     Upper Body:     Right upper body: No supraclavicular or axillary adenopathy.     Left upper body: No supraclavicular or axillary adenopathy.  Neurological:     General: No focal  deficit present.     Mental Status: She is alert and oriented to person, place, and time.  Psychiatric:        Mood and Affect: Mood normal.        Behavior: Behavior normal.     LABS:      Latest Ref Rng & Units 01/18/2023    9:11 AM 01/11/2023    9:49 AM 01/03/2023   10:42 AM  CBC  WBC 4.0 - 10.5 K/uL 8.2  6.8  8.8   Hemoglobin 12.0 - 15.0 g/dL 21.3  08.6  57.8   Hematocrit 36.0 - 46.0 % 36.8  36.1  35.1   Platelets 150 - 400 K/uL 292  289  349       Latest Ref Rng & Units  01/18/2023    9:11 AM 01/11/2023    9:49 AM 01/03/2023   10:42 AM  CMP  Glucose 70 - 99 mg/dL 469  629  528   BUN 6 - 20 mg/dL 13  14  12    Creatinine 0.44 - 1.00 mg/dL 4.13  2.44  0.10   Sodium 135 - 145 mmol/L 136  135  136   Potassium 3.5 - 5.1 mmol/L 3.8  3.4  3.3   Chloride 98 - 111 mmol/L 103  104  104   CO2 22 - 32 mmol/L 23  21  22    Calcium 8.9 - 10.3 mg/dL 8.6  8.6  8.9   Total Protein 6.5 - 8.1 g/dL 7.2  6.9  6.9   Total Bilirubin 0.3 - 1.2 mg/dL 0.8  0.7  0.7   Alkaline Phos 38 - 126 U/L 91  82  82   AST 15 - 41 U/L 44  52  71   ALT 0 - 44 U/L 101  100  107      No results found for: "CEA1", "CEA" / No results found for: "CEA1", "CEA" No results found for: "PSA1" No results found for: "UVO536" No results found for: "CAN125"  No results found for: "TOTALPROTELP", "ALBUMINELP", "A1GS", "A2GS", "BETS", "BETA2SER", "GAMS", "MSPIKE", "SPEI" No results found for: "TIBC", "FERRITIN", "IRONPCTSAT" No results found for: "LDH"   STUDIES:   No results found.

## 2023-01-25 ENCOUNTER — Inpatient Hospital Stay (HOSPITAL_BASED_OUTPATIENT_CLINIC_OR_DEPARTMENT_OTHER): Payer: Managed Care, Other (non HMO) | Admitting: Hematology

## 2023-01-25 ENCOUNTER — Inpatient Hospital Stay: Payer: Managed Care, Other (non HMO) | Admitting: Hematology

## 2023-01-25 ENCOUNTER — Other Ambulatory Visit: Payer: Self-pay

## 2023-01-25 ENCOUNTER — Inpatient Hospital Stay: Payer: Managed Care, Other (non HMO)

## 2023-01-25 ENCOUNTER — Inpatient Hospital Stay: Payer: Managed Care, Other (non HMO) | Attending: Hematology

## 2023-01-25 ENCOUNTER — Encounter: Payer: Self-pay | Admitting: Hematology

## 2023-01-25 VITALS — BP 115/68 | HR 81 | Temp 97.2°F | Resp 18

## 2023-01-25 DIAGNOSIS — R002 Palpitations: Secondary | ICD-10-CM | POA: Diagnosis not present

## 2023-01-25 DIAGNOSIS — Z5111 Encounter for antineoplastic chemotherapy: Secondary | ICD-10-CM | POA: Insufficient documentation

## 2023-01-25 DIAGNOSIS — C50212 Malignant neoplasm of upper-inner quadrant of left female breast: Secondary | ICD-10-CM

## 2023-01-25 DIAGNOSIS — Z79899 Other long term (current) drug therapy: Secondary | ICD-10-CM | POA: Diagnosis not present

## 2023-01-25 LAB — CBC WITH DIFFERENTIAL/PLATELET
Abs Immature Granulocytes: 0.25 10*3/uL — ABNORMAL HIGH (ref 0.00–0.07)
Basophils Absolute: 0.1 10*3/uL (ref 0.0–0.1)
Basophils Relative: 1 %
Eosinophils Absolute: 0.4 10*3/uL (ref 0.0–0.5)
Eosinophils Relative: 5 %
HCT: 35.6 % — ABNORMAL LOW (ref 36.0–46.0)
Hemoglobin: 11.7 g/dL — ABNORMAL LOW (ref 12.0–15.0)
Immature Granulocytes: 4 %
Lymphocytes Relative: 15 %
Lymphs Abs: 1.1 10*3/uL (ref 0.7–4.0)
MCH: 28.9 pg (ref 26.0–34.0)
MCHC: 32.9 g/dL (ref 30.0–36.0)
MCV: 87.9 fL (ref 80.0–100.0)
Monocytes Absolute: 0.4 10*3/uL (ref 0.1–1.0)
Monocytes Relative: 6 %
Neutro Abs: 5 10*3/uL (ref 1.7–7.7)
Neutrophils Relative %: 69 %
Platelets: 290 10*3/uL (ref 150–400)
RBC: 4.05 MIL/uL (ref 3.87–5.11)
RDW: 19 % — ABNORMAL HIGH (ref 11.5–15.5)
WBC: 7.1 10*3/uL (ref 4.0–10.5)
nRBC: 0 % (ref 0.0–0.2)

## 2023-01-25 LAB — COMPREHENSIVE METABOLIC PANEL
ALT: 117 U/L — ABNORMAL HIGH (ref 0–44)
AST: 63 U/L — ABNORMAL HIGH (ref 15–41)
Albumin: 3.9 g/dL (ref 3.5–5.0)
Alkaline Phosphatase: 84 U/L (ref 38–126)
Anion gap: 9 (ref 5–15)
BUN: 11 mg/dL (ref 6–20)
CO2: 23 mmol/L (ref 22–32)
Calcium: 8.7 mg/dL — ABNORMAL LOW (ref 8.9–10.3)
Chloride: 104 mmol/L (ref 98–111)
Creatinine, Ser: 0.67 mg/dL (ref 0.44–1.00)
GFR, Estimated: >60 mL/min (ref >60)
Glucose, Bld: 103 mg/dL — ABNORMAL HIGH (ref 70–99)
Potassium: 3.8 mmol/L (ref 3.5–5.1)
Sodium: 136 mmol/L (ref 135–145)
Total Bilirubin: 0.7 mg/dL (ref 0.3–1.2)
Total Protein: 7 g/dL (ref 6.5–8.1)

## 2023-01-25 LAB — MAGNESIUM: Magnesium: 2 mg/dL (ref 1.7–2.4)

## 2023-01-25 MED ORDER — SODIUM CHLORIDE 0.9 % IV SOLN: Freq: Once | INTRAVENOUS | Status: AC

## 2023-01-25 MED ORDER — CETIRIZINE HCL 10 MG/ML IV SOLN
10.0000 mg | Freq: Once | INTRAVENOUS | Status: AC
  Administered 2023-01-25: 10 mg via INTRAVENOUS
  Filled 2023-01-25: qty 1

## 2023-01-25 MED ORDER — FAMOTIDINE IN NACL 20-0.9 MG/50ML-% IV SOLN
20.0000 mg | Freq: Once | INTRAVENOUS | Status: AC
  Administered 2023-01-25: 20 mg via INTRAVENOUS
  Filled 2023-01-25: qty 50

## 2023-01-25 MED ORDER — HEPARIN SOD (PORK) LOCK FLUSH 100 UNIT/ML IV SOLN
500.0000 [IU] | Freq: Once | INTRAVENOUS | Status: AC | PRN
  Administered 2023-01-25: 500 [IU]

## 2023-01-25 MED ORDER — SODIUM CHLORIDE 0.9% FLUSH
10.0000 mL | INTRAVENOUS | Status: DC | PRN
  Administered 2023-01-25: 10 mL

## 2023-01-25 MED ORDER — SODIUM CHLORIDE 0.9 % IV SOLN
10.0000 mg | Freq: Once | INTRAVENOUS | Status: AC
  Administered 2023-01-25: 10 mg via INTRAVENOUS
  Filled 2023-01-25: qty 10

## 2023-01-25 MED ORDER — SODIUM CHLORIDE 0.9 % IV SOLN
64.0000 mg/m2 | Freq: Once | INTRAVENOUS | Status: AC
  Administered 2023-01-25: 156 mg via INTRAVENOUS
  Filled 2023-01-25: qty 26

## 2023-01-25 MED ORDER — ONDANSETRON HCL 4 MG/2ML IJ SOLN
8.0000 mg | Freq: Once | INTRAMUSCULAR | Status: AC
  Administered 2023-01-25: 8 mg via INTRAVENOUS
  Filled 2023-01-25: qty 4

## 2023-01-25 NOTE — Patient Instructions (Signed)

## 2023-01-25 NOTE — Progress Notes (Signed)
Patient has been examined by Dr. Ellin Saba. Vital signs and labs have been reviewed by MD - ANC, Creatinine, LFTs (ALT 117), hemoglobin, and platelets are within treatment parameters per M.D. - pt may proceed with treatment.  Primary RN and pharmacy notified.

## 2023-01-25 NOTE — Progress Notes (Signed)
Labs reviewed with MD today at office visit. Ok to treat per MD. Taxol dose reduced today per MD.   Treatment given per orders. Patient tolerated it well without problems. Vitals stable and discharged home from clinic ambulatory. Follow up as scheduled.

## 2023-01-25 NOTE — Patient Instructions (Signed)
[facility]  Discharge Instructions: Thank you for choosing [facility] to provide your oncology and hematology care.  If you have a lab appointment with the Cancer Center - please note that after April 8th, 2024, all labs will be drawn in the cancer center.  You do not have to check in or register with the main entrance as you have in the past but will complete your check-in in the cancer center.  Wear comfortable clothing and clothing appropriate for easy access to any Portacath or PICC line.   We strive to give you quality time with your provider. You may need to reschedule your appointment if you arrive late (15 or more minutes).  Arriving late affects you and other patients whose appointments are after yours.  Also, if you miss three or more appointments without notifying the office, you may be dismissed from the clinic at the provider's discretion.      For prescription refill requests, have your pharmacy contact our office and allow 72 hours for refills to be completed.    Today you received the following chemotherapy and/or immunotherapy agents Taxol      To help prevent nausea and vomiting after your treatment, we encourage you to take your nausea medication as directed.  BELOW ARE SYMPTOMS THAT SHOULD BE REPORTED IMMEDIATELY: *FEVER GREATER THAN 100.4 F (38 C) OR HIGHER *CHILLS OR SWEATING *NAUSEA AND VOMITING THAT IS NOT CONTROLLED WITH YOUR NAUSEA MEDICATION *UNUSUAL SHORTNESS OF BREATH *UNUSUAL BRUISING OR BLEEDING *URINARY PROBLEMS (pain or burning when urinating, or frequent urination) *BOWEL PROBLEMS (unusual diarrhea, constipation, pain near the anus) TENDERNESS IN MOUTH AND THROAT WITH OR WITHOUT PRESENCE OF ULCERS (sore throat, sores in mouth, or a toothache) UNUSUAL RASH, SWELLING OR PAIN  UNUSUAL VAGINAL DISCHARGE OR ITCHING   Items with * indicate a potential emergency and should be followed up as soon as possible or go to the  Emergency Department if any problems should occur.  Please show the CHEMOTHERAPY ALERT CARD or IMMUNOTHERAPY ALERT CARD at check-in to the Emergency Department and triage nurse.  Should you have questions after your visit or need to cancel or reschedule your appointment, please contact [facility] [phone]  and follow the prompts.  Office hours are 8:00 a.m. to 4:30 p.m. Monday - Friday. Please note that voicemails left after 4:00 p.m. may not be returned until the following business day.  We are closed weekends and major holidays. You have access to a nurse at all times for urgent questions. Please call the main number to the clinic [phone] and follow the prompts.  For any non-urgent questions, you may also contact your provider using MyChart. We now offer e-Visits for anyone 5 and older to request care online for non-urgent symptoms. For details visit [email or web address].   Also download the MyChart app! Go to the app store, search "MyChart", open the app, select [place], and log in with your MyChart username and password.

## 2023-01-25 NOTE — Progress Notes (Signed)
Ok to proceed with labs,  paclitaxel dose decrease by 20% today.  T.O. Dr Carilyn Goodpasture, PharmD

## 2023-01-28 ENCOUNTER — Other Ambulatory Visit: Payer: Self-pay | Admitting: Internal Medicine

## 2023-01-28 DIAGNOSIS — I10 Essential (primary) hypertension: Secondary | ICD-10-CM

## 2023-01-29 ENCOUNTER — Ambulatory Visit (INDEPENDENT_AMBULATORY_CARE_PROVIDER_SITE_OTHER): Payer: Managed Care, Other (non HMO) | Admitting: Internal Medicine

## 2023-01-29 ENCOUNTER — Encounter: Payer: Self-pay | Admitting: Internal Medicine

## 2023-01-29 VITALS — BP 133/88 | HR 102 | Resp 16 | Ht 67.0 in | Wt 263.0 lb

## 2023-01-29 DIAGNOSIS — K760 Fatty (change of) liver, not elsewhere classified: Secondary | ICD-10-CM | POA: Diagnosis not present

## 2023-01-29 DIAGNOSIS — F419 Anxiety disorder, unspecified: Secondary | ICD-10-CM | POA: Diagnosis not present

## 2023-01-29 DIAGNOSIS — I10 Essential (primary) hypertension: Secondary | ICD-10-CM | POA: Diagnosis not present

## 2023-01-29 DIAGNOSIS — E559 Vitamin D deficiency, unspecified: Secondary | ICD-10-CM

## 2023-01-29 DIAGNOSIS — F3341 Major depressive disorder, recurrent, in partial remission: Secondary | ICD-10-CM

## 2023-01-29 MED ORDER — LOSARTAN POTASSIUM 25 MG PO TABS: 25.0000 mg | ORAL_TABLET | Freq: Every day | ORAL | 1 refills | Status: DC

## 2023-01-29 MED ORDER — ESCITALOPRAM OXALATE 10 MG PO TABS
10.0000 mg | ORAL_TABLET | Freq: Every day | ORAL | 1 refills | Status: DC
Start: 2023-01-29 — End: 2023-07-19

## 2023-01-29 NOTE — Assessment & Plan Note (Signed)
Remains adequately controlled with Lexapro and as needed use of hydroxyzine.

## 2023-01-29 NOTE — Assessment & Plan Note (Signed)
Remains adequately controlled with losartan 25 mg daily.  Refill provided today.

## 2023-01-29 NOTE — Assessment & Plan Note (Signed)
LFTs remain stably elevated.  She has lost 10 pounds since her last appointment. -No changes today.  Follow-up in 3 months for reassessment.

## 2023-01-29 NOTE — Assessment & Plan Note (Signed)
Mood remains stable with Lexapro 10 mg daily.  She continues to see a Veterinary surgeon in [place].  Lexapro has been refilled today.

## 2023-01-29 NOTE — Patient Instructions (Signed)
It was a pleasure to see you today.  Thank you for giving [place] the opportunity to be involved in your care.  Below is a brief recap of your visit and next steps.  We will plan to see you again in 3 months.  Summary Medical nutrition therapy referral placed today Losartan and Lexapro have been refilled Follow up in 3 months

## 2023-01-29 NOTE — Assessment & Plan Note (Signed)
Recent labs are consistent with vitamin D insufficiency.  She is currently prescribed daily vitamin D supplementation.

## 2023-01-29 NOTE — Progress Notes (Signed)
Established Patient Office Visit  Subjective   Patient ID: Diana Jensen, female    DOB: Jul 01, 1975  Age: 47 y.o. MRN: [id number]  Chief Complaint  Patient presents with   Follow-up    HTN and anxiety    [name] returns to care today for routine follow-up.  Last evaluated by me on 5/6.  In the interim, she has been followed by oncology, most recently seen 8/1. There have otherwise been no acute interval events.  [name] reports feeling fairly well today.  She states that she will complete chemotherapy in 7 weeks.  She has recently experienced heart palpitations.  Her paclitaxel dose has been reduced by 20% as a result.  ECG obtained in office last week did not show any acute findings.  She has a TTE scheduled for Wednesday (8/7).  She does not have any acute concerns to discuss today.  Past Medical History:  Diagnosis Date   Allergy    seasonal   Antiphospholipid syndrome (HCC)    Anxiety    Arthritis    deterioration of spine, knees   Carpal tunnel syndrome of right wrist    Clotting disorder (HCC)    antiphospholipid syndrome   Depression    Encounter for general adult medical examination with abnormal findings 07/03/2022   Essential hypertension, benign 02/10/2020   Hyperlipidemia 08/02/2022   Sleep disorder 02/22/2016   Past Surgical History:  Procedure Laterality Date   BREAST BIOPSY Left 08/10/2022   [place] LT BREAST BX W LOC DEV 1ST LESION IMG BX SPEC US GUIDE 08/10/2022 [facility]   BREAST BIOPSY Left 08/18/2022   MM LT BREAST BX W LOC DEV EA AD LESION IMG BX SPEC STEREO GUIDE 08/18/2022 [facility]   BREAST BIOPSY Left 08/18/2022   MM LT BREAST BX W LOC DEV 1ST LESION IMAGE BX SPEC STEREO GUIDE 08/18/2022 [facility]   CARPAL TUNNEL RELEASE Right 01/09/2019   Procedure: RIGHT CARPAL TUNNEL RELEASE;  Surgeon: Vickki Hearing, MD;  Location: [facility];  Service: Orthopedics;  Laterality: Right;   DILATION AND CURETTAGE OF UTERUS     x2   EYE SURGERY      lazy eye   IRRIGATION AND DEBRIDEMENT HEMATOMA Left 10/05/2022   Procedure: HEMATOMA EVACUATION OF LEFT BREAST WITH JP DRAIN PLACEMENT;  Surgeon: [name], MD;  Location: [facility];  Service: General;  Laterality: Left;   PARTIAL HYSTERECTOMY  2011   PORTACATH PLACEMENT Right 10/05/2022   Procedure: INSERTION PORT-A-CATH;  Surgeon: [name], MD;  Location: [facility];  Service: General;  Laterality: Right;   SIMPLE MASTECTOMY WITH AXILLARY SENTINEL NODE BIOPSY Left 08/28/2022   Procedure: SIMPLE MASTECTOMY WITH AXILLARY SENTINEL NODE BIOPSY;  Surgeon: [name], MD;  Location: [facility];  Service: General;  Laterality: Left;   Social History   Tobacco Use   Smoking status: Every Day    Current packs/day: 0.50    Average packs/day: 0.5 packs/day for 28.0 years (14.0 ttl pk-yrs)    Types: Cigarettes   Smokeless tobacco: Never   Tobacco comments:    She has been smoking since age 75, smokes half a pack daily,was doing one pack a day until last year   Vaping Use   Vaping status: Never Used  Substance Use Topics   Alcohol use: No   Drug use: No   Family History  Problem Relation Age of Onset   Breast cancer Mother 22   Arthritis Mother    Hypertension Mother  Osteoporosis Mother    Thyroid disease Father    Hypertension Father    Skin cancer Father    Mental illness Sister        depression ? bipolar   Dementia Maternal Grandmother    Prostate cancer Maternal Grandfather        prostate   Heart disease Paternal Grandmother    Colon cancer Paternal Grandfather    Mental illness Daughter    Pancreatic cancer Cousin        maternal cousin   Cancer Cousin        paternal, unknown type   Cancer Cousin        paternal, unknown type   Cancer - Lung Neg Hx    Cervical cancer Neg Hx    No Known Allergies  Review of Systems  Constitutional:  Negative for chills and fever.  HENT:  Negative for sore throat.   Respiratory:  Negative for cough and shortness of  breath.   Cardiovascular:  Negative for chest pain, palpitations and leg swelling.  Gastrointestinal:  Negative for abdominal pain, blood in stool, constipation, diarrhea, nausea and vomiting.  Genitourinary:  Negative for dysuria and hematuria.  Musculoskeletal:  Negative for myalgias.  Skin:  Negative for itching and rash.  Neurological:  Negative for dizziness and headaches.  Psychiatric/Behavioral:  Negative for depression and suicidal ideas.      Objective:     BP 133/88   Pulse (!) 102   Resp 16   Ht 5\' 7"  (1.702 m)   Wt 263 lb (119.3 kg)   SpO2 94%   BMI 41.19 kg/m  BP Readings from Last 3 Encounters:  01/29/23 133/88  01/25/23 115/68  01/25/23 127/84   Physical Exam Vitals reviewed.  Constitutional:      General: She is not in acute distress.    Appearance: Normal appearance. She is obese. She is not toxic-appearing.  HENT:     Head: Normocephalic and atraumatic.     Right Ear: External ear normal.     Left Ear: External ear normal.     Nose: Nose normal. No congestion or rhinorrhea.     Mouth/Throat:     Mouth: Mucous membranes are moist.     Pharynx: Oropharynx is clear. No oropharyngeal exudate or posterior oropharyngeal erythema.  Eyes:     General: No scleral icterus.    Extraocular Movements: Extraocular movements intact.     Conjunctiva/sclera: Conjunctivae normal.     Pupils: Pupils are equal, round, and reactive to light.  Cardiovascular:     Rate and Rhythm: Normal rate and regular rhythm.     Pulses: Normal pulses.     Heart sounds: Normal heart sounds. No murmur heard.    No friction rub. No gallop.  Pulmonary:     Effort: Pulmonary effort is normal.     Breath sounds: Normal breath sounds. No wheezing, rhonchi or rales.  Abdominal:     General: Abdomen is flat. Bowel sounds are normal. There is no distension.     Palpations: Abdomen is soft.     Tenderness: There is no abdominal tenderness.  Musculoskeletal:        General: No swelling.  Normal range of motion.     Cervical back: Normal range of motion.     Right lower leg: No edema.     Left lower leg: No edema.  Lymphadenopathy:     Cervical: No cervical adenopathy.  Skin:    General: Skin is warm and  dry.     Capillary Refill: Capillary refill takes less than 2 seconds.     Coloration: Skin is not jaundiced.  Neurological:     General: No focal deficit present.     Mental Status: She is alert and oriented to person, place, and time.  Psychiatric:        Mood and Affect: Mood normal.        Behavior: Behavior normal.   Last CBC Lab Results  Component Value Date   WBC 7.1 01/25/2023   HGB 11.7 (L) 01/25/2023   HCT 35.6 (L) 01/25/2023   MCV 87.9 01/25/2023   MCH 28.9 01/25/2023   RDW 19.0 (H) 01/25/2023   PLT 290 01/25/2023   Last metabolic panel Lab Results  Component Value Date   GLUCOSE 103 (H) 01/25/2023   NA 136 01/25/2023   K 3.8 01/25/2023   CL 104 01/25/2023   CO2 23 01/25/2023   BUN 11 01/25/2023   CREATININE 0.67 01/25/2023   GFRNONAA >60 01/25/2023   CALCIUM 8.7 (L) 01/25/2023   PROT 7.0 01/25/2023   ALBUMIN 3.9 01/25/2023   LABGLOB 2.4 07/03/2022   AGRATIO 1.9 07/03/2022   BILITOT 0.7 01/25/2023   ALKPHOS 84 01/25/2023   AST 63 (H) 01/25/2023   ALT 117 (H) 01/25/2023   ANIONGAP 9 01/25/2023   Last lipids Lab Results  Component Value Date   CHOL 254 (H) 07/03/2022   HDL 33 (L) 07/03/2022   LDLCALC 163 (H) 07/03/2022   TRIG 308 (H) 07/03/2022   CHOLHDL 7.7 (H) 07/03/2022   Last hemoglobin A1c Lab Results  Component Value Date   HGBA1C 6.0 (H) 07/03/2022   Last thyroid functions Lab Results  Component Value Date   TSH 1.410 07/03/2022   Last vitamin D Lab Results  Component Value Date   VD25OH 23.76 (L) 11/27/2022   Last vitamin B12 and Folate Lab Results  Component Value Date   VITAMINB12 281 07/03/2022   FOLATE 6.2 07/03/2022   The 10-year ASCVD risk score (Arnett DK, et al., 2019) is: 13.1%    Assessment &  Plan:   Problem List Items Addressed This Visit       Essential hypertension, benign    Remains adequately controlled with losartan 25 mg daily.  Refill provided today.      Hepatic steatosis - Primary    LFTs remain stably elevated.  She has lost 10 pounds since her last appointment. -No changes today.  Follow-up in 3 months for reassessment.      Depression, major, recurrent, in partial remission (HCC)    Mood remains stable with Lexapro 10 mg daily.  She continues to see a Veterinary surgeon in [place].  Lexapro has been refilled today.      Vitamin D insufficiency    Recent labs are consistent with vitamin D insufficiency.  She is currently prescribed daily vitamin D supplementation.      Anxiety    Remains adequately controlled with Lexapro and as needed use of hydroxyzine.      Morbid obesity (HCC)    BMI 41.1.  She has lost 10 pounds since her last appointment.  She remains focused on lifestyle modifications aimed at weight loss.  Through shared decision making, a referral to medical nutrition therapy has been placed.       Return in about 3 months (around 05/01/2023).    [name], MD

## 2023-01-29 NOTE — Assessment & Plan Note (Signed)
BMI 41.1.  She has lost 10 pounds since her last appointment.  She remains focused on lifestyle modifications aimed at weight loss.  Through shared decision making, a referral to medical nutrition therapy has been placed.

## 2023-01-31 ENCOUNTER — Other Ambulatory Visit: Payer: Self-pay

## 2023-01-31 ENCOUNTER — Ambulatory Visit (HOSPITAL_COMMUNITY)
Admission: RE | Admit: 2023-01-31 | Discharge: 2023-01-31 | Disposition: A | Discharge status: Home / Self Care | Payer: Managed Care, Other (non HMO) | Source: Ambulatory Visit | Attending: Hematology | Admitting: Hematology

## 2023-01-31 DIAGNOSIS — I10 Essential (primary) hypertension: Secondary | ICD-10-CM | POA: Insufficient documentation

## 2023-01-31 DIAGNOSIS — I361 Nonrheumatic tricuspid (valve) insufficiency: Secondary | ICD-10-CM

## 2023-01-31 DIAGNOSIS — R002 Palpitations: Secondary | ICD-10-CM | POA: Insufficient documentation

## 2023-01-31 DIAGNOSIS — F17200 Nicotine dependence, unspecified, uncomplicated: Secondary | ICD-10-CM | POA: Diagnosis not present

## 2023-01-31 LAB — ECHOCARDIOGRAM COMPLETE
Area-P 1/2: 3.17 cm2
Calc EF: 55.7 %
MV VTI: 3.42 cm2
S' Lateral: 3.4 cm
Single Plane A2C EF: 57 %
Single Plane A4C EF: 52.5 %

## 2023-01-31 NOTE — Progress Notes (Signed)
  Echocardiogram 2D Echocardiogram has been performed.  Diana Jensen 01/31/2023, 12:10 PM

## 2023-02-01 ENCOUNTER — Inpatient Hospital Stay: Payer: Managed Care, Other (non HMO)

## 2023-02-01 ENCOUNTER — Inpatient Hospital Stay: Payer: Managed Care, Other (non HMO) | Admitting: Hematology

## 2023-02-01 VITALS — BP 126/88 | HR 81 | Temp 97.3°F | Resp 18

## 2023-02-01 DIAGNOSIS — Z170 Estrogen receptor positive status [ER+]: Secondary | ICD-10-CM

## 2023-02-01 DIAGNOSIS — Z5111 Encounter for antineoplastic chemotherapy: Secondary | ICD-10-CM | POA: Diagnosis not present

## 2023-02-01 LAB — COMPREHENSIVE METABOLIC PANEL
ALT: 74 U/L — ABNORMAL HIGH (ref 0–44)
AST: 44 U/L — ABNORMAL HIGH (ref 15–41)
Albumin: 3.6 g/dL (ref 3.5–5.0)
Alkaline Phosphatase: 72 U/L (ref 38–126)
Anion gap: 11 (ref 5–15)
BUN: 12 mg/dL (ref 6–20)
CO2: 22 mmol/L (ref 22–32)
Calcium: 8.6 mg/dL — ABNORMAL LOW (ref 8.9–10.3)
Chloride: 104 mmol/L (ref 98–111)
Creatinine, Ser: 0.7 mg/dL (ref 0.44–1.00)
GFR, Estimated: >60 mL/min (ref >60)
Glucose, Bld: 131 mg/dL — ABNORMAL HIGH (ref 70–99)
Potassium: 3.4 mmol/L — ABNORMAL LOW (ref 3.5–5.1)
Sodium: 137 mmol/L (ref 135–145)
Total Bilirubin: 0.5 mg/dL (ref 0.3–1.2)
Total Protein: 6.4 g/dL — ABNORMAL LOW (ref 6.5–8.1)

## 2023-02-01 LAB — CBC WITH DIFFERENTIAL/PLATELET
Abs Immature Granulocytes: 0.17 10*3/uL — ABNORMAL HIGH (ref 0.00–0.07)
Basophils Absolute: 0 10*3/uL (ref 0.0–0.1)
Basophils Relative: 1 %
Eosinophils Absolute: 0.3 10*3/uL (ref 0.0–0.5)
Eosinophils Relative: 5 %
HCT: 33.9 % — ABNORMAL LOW (ref 36.0–46.0)
Hemoglobin: 11 g/dL — ABNORMAL LOW (ref 12.0–15.0)
Immature Granulocytes: 3 %
Lymphocytes Relative: 16 %
Lymphs Abs: 0.9 10*3/uL (ref 0.7–4.0)
MCH: 28.9 pg (ref 26.0–34.0)
MCHC: 32.4 g/dL (ref 30.0–36.0)
MCV: 89.2 fL (ref 80.0–100.0)
Monocytes Absolute: 0.3 10*3/uL (ref 0.1–1.0)
Monocytes Relative: 5 %
Neutro Abs: 3.9 10*3/uL (ref 1.7–7.7)
Neutrophils Relative %: 70 %
Platelets: 287 10*3/uL (ref 150–400)
RBC: 3.8 MIL/uL — ABNORMAL LOW (ref 3.87–5.11)
RDW: 18.3 % — ABNORMAL HIGH (ref 11.5–15.5)
WBC: 5.6 10*3/uL (ref 4.0–10.5)
nRBC: 0 % (ref 0.0–0.2)

## 2023-02-01 LAB — MAGNESIUM: Magnesium: 2 mg/dL (ref 1.7–2.4)

## 2023-02-01 MED ORDER — SODIUM CHLORIDE 0.9 % IV SOLN
10.0000 mg | Freq: Once | INTRAVENOUS | Status: AC
  Administered 2023-02-01: 10 mg via INTRAVENOUS
  Filled 2023-02-01: qty 10

## 2023-02-01 MED ORDER — FAMOTIDINE IN NACL 20-0.9 MG/50ML-% IV SOLN
20.0000 mg | Freq: Once | INTRAVENOUS | Status: AC
  Administered 2023-02-01: 20 mg via INTRAVENOUS
  Filled 2023-02-01: qty 50

## 2023-02-01 MED ORDER — CETIRIZINE HCL 10 MG/ML IV SOLN
10.0000 mg | Freq: Once | INTRAVENOUS | Status: AC
  Administered 2023-02-01: 10 mg via INTRAVENOUS
  Filled 2023-02-01: qty 1

## 2023-02-01 MED ORDER — HEPARIN SOD (PORK) LOCK FLUSH 100 UNIT/ML IV SOLN
500.0000 [IU] | Freq: Once | INTRAVENOUS | Status: AC | PRN
  Administered 2023-02-01: 500 [IU]

## 2023-02-01 MED ORDER — SODIUM CHLORIDE 0.9 % IV SOLN: Freq: Once | INTRAVENOUS | Status: AC

## 2023-02-01 MED ORDER — SODIUM CHLORIDE 0.9% FLUSH
10.0000 mL | Freq: Once | INTRAVENOUS | Status: AC
  Administered 2023-02-01: 10 mL via INTRAVENOUS

## 2023-02-01 MED ORDER — SODIUM CHLORIDE 0.9% FLUSH
10.0000 mL | INTRAVENOUS | Status: DC | PRN
  Administered 2023-02-01: 10 mL

## 2023-02-01 MED ORDER — ONDANSETRON HCL 4 MG/2ML IJ SOLN
8.0000 mg | Freq: Once | INTRAMUSCULAR | Status: AC
  Administered 2023-02-01: 8 mg via INTRAVENOUS
  Filled 2023-02-01: qty 4

## 2023-02-01 MED ORDER — SODIUM CHLORIDE 0.9 % IV SOLN
64.0000 mg/m2 | Freq: Once | INTRAVENOUS | Status: AC
  Administered 2023-02-01: 156 mg via INTRAVENOUS
  Filled 2023-02-01: qty 26

## 2023-02-01 NOTE — Progress Notes (Signed)
Patient presents today for Taxol infusion per providers order.  Vital signs and labs within parameters for treatment.  Patient has no new complaints at this time.  Treatment given today per MD orders.  Stable during infusion without adverse affects.  Vital signs stable.  No complaints at this time.  Discharge from clinic ambulatory in stable condition.  Alert and oriented X 3.  Follow up with [facility] as scheduled.

## 2023-02-01 NOTE — Patient Instructions (Signed)
[facility]  Discharge Instructions: Thank you for choosing [facility] to provide your oncology and hematology care.  If you have a lab appointment with the Cancer Center - please note that after April 8th, 2024, all labs will be drawn in the cancer center.  You do not have to check in or register with the main entrance as you have in the past but will complete your check-in in the cancer center.  Wear comfortable clothing and clothing appropriate for easy access to any Portacath or PICC line.   We strive to give you quality time with your provider. You may need to reschedule your appointment if you arrive late (15 or more minutes).  Arriving late affects you and other patients whose appointments are after yours.  Also, if you miss three or more appointments without notifying the office, you may be dismissed from the clinic at the provider's discretion.      For prescription refill requests, have your pharmacy contact our office and allow 72 hours for refills to be completed.    Today you received the following chemotherapy and/or immunotherapy agents Taxol      To help prevent nausea and vomiting after your treatment, we encourage you to take your nausea medication as directed.  BELOW ARE SYMPTOMS THAT SHOULD BE REPORTED IMMEDIATELY: *FEVER GREATER THAN 100.4 F (38 C) OR HIGHER *CHILLS OR SWEATING *NAUSEA AND VOMITING THAT IS NOT CONTROLLED WITH YOUR NAUSEA MEDICATION *UNUSUAL SHORTNESS OF BREATH *UNUSUAL BRUISING OR BLEEDING *URINARY PROBLEMS (pain or burning when urinating, or frequent urination) *BOWEL PROBLEMS (unusual diarrhea, constipation, pain near the anus) TENDERNESS IN MOUTH AND THROAT WITH OR WITHOUT PRESENCE OF ULCERS (sore throat, sores in mouth, or a toothache) UNUSUAL RASH, SWELLING OR PAIN  UNUSUAL VAGINAL DISCHARGE OR ITCHING   Items with * indicate a potential emergency and should be followed up as soon as possible or go to the  Emergency Department if any problems should occur.  Please show the CHEMOTHERAPY ALERT CARD or IMMUNOTHERAPY ALERT CARD at check-in to the Emergency Department and triage nurse.  Should you have questions after your visit or need to cancel or reschedule your appointment, please contact [facility] [phone]  and follow the prompts.  Office hours are 8:00 a.m. to 4:30 p.m. Monday - Friday. Please note that voicemails left after 4:00 p.m. may not be returned until the following business day.  We are closed weekends and major holidays. You have access to a nurse at all times for urgent questions. Please call the main number to the clinic [phone] and follow the prompts.  For any non-urgent questions, you may also contact your provider using MyChart. We now offer e-Visits for anyone 5 and older to request care online for non-urgent symptoms. For details visit [email or web address].   Also download the MyChart app! Go to the app store, search "MyChart", open the app, select [place], and log in with your MyChart username and password.

## 2023-02-07 NOTE — Progress Notes (Signed)
[facility] [address], [place] [address]    Clinic Day:  02/08/2023  Referring physician: [name], MD  Patient Care Team: [name], MD as PCP - General (Internal Medicine) [name], MD as Consulting Physician (Hematology)   ASSESSMENT & PLAN:   Assessment: 1.  T1cN1 G2 left breast IDC, ER/PR positive, HER2 negative: - She felt a lump in her breast in December 2023. - Left breast subareolar mass at 9:00 biopsy (08/10/2022): grade 2 IDC, ER 95% strong intensity, PR 90% strong intensity, Ki-67 10%, HER2 1+ - Left mastectomy and SLNB (08/28/2022): 1.6 x 1.2 x 0.7 cm IDC, margins negative, LVI present, 1/1 lymph node with metastatic carcinoma, metastasis 15 mm, focal extracapsular extension.  PT1 cpN1a. 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  No prior history of arterial/venous thrombosis/strokes. - PET scan (10/19/2022): No evidence of hypermetabolic metastatic disease.  Diffuse hepatic steatosis. - 4 cycles of adjuvant AC from 10/25/2022 through 12/12/2022.  Weekly paclitaxel started on 12/27/2022.   2.  Social/family history: - Seen today with her husband and daughter.  She worked as a Conservation officer, nature.  Current active smoker, 5 to 7 cigarettes/day, started at age 67. - Mother had breast cancer at age 55.  Father had skin cancer.  Paternal grandfather had colon cancer.  Maternal grandfather had prostate cancer.  1 maternal cousin had pancreatic cancer.  2 paternal cousins have cancer.    Plan: 1.  T1cN1 G2 left breast IDC, ER/PR positive, HER2 negative: - She has completed 6 weekly dose of paclitaxel. - Reviewed labs today: AST and ALT elevated at 56 and 92.  Rest of LFTs are normal.  CBC grossly normal. - Proceed with week 7 today with 20% dose reduction.  RTC 2 weeks for follow-up.   2.  Peripheral neuropathy: - Constant numbness in the fingertips is stable. - She has occasional pins and needle sensation which comes and  goes in the hands. - We have cut back on paclitaxel dose.  Will hold off on gabapentin at this time.   3.  High risk drug monitoring: - Reviewed echocardiogram from 01/31/2023: EF 50-55%, down from 55-60% in April. - She has occasional flutter like feeling.  She has an appointment with cardiology in October.   4.  Low vitamin D levels: - Continue vitamin D 1000 units daily.  Last vitamin D was 23.    No orders of the defined types were placed in this encounter.     Diana Jensen,acting as a Neurosurgeon for [name], MD.,have documented all relevant documentation on the behalf of [name], MD,as directed by  [name], MD while in the presence of [name], MD.  I, [name] MD, have reviewed the above documentation for accuracy and completeness, and I agree with the above.      [name], MD   8/15/20247:03 PM  CHIEF COMPLAINT:   Diagnosis: left breast cancer    Cancer Staging  Breast cancer of upper-inner quadrant of left female breast [facility]) Staging form: Breast, AJCC 8th Edition - Clinical stage from 10/01/2022: Stage IB (cT1c, cN1, cM0, G2, ER+, PR+, HER2-) - Signed by [name], MD on 10/01/2022    Prior Therapy: 1. left mastectomy and +SLNB with Dr. Algis Greenhouse on 08/28/22  2. Adjuvant AC, 4 cycles, 10/25/22 - 12/12/22  Current Therapy:  weekly paclitaxel    HISTORY OF PRESENT ILLNESS:   Oncology History  Breast cancer of  upper-inner quadrant of left female breast (HCC)  10/01/2022 Initial Diagnosis   Breast cancer of upper-inner quadrant of left female breast   10/01/2022 Cancer Staging   Staging form: Breast, AJCC 8th Edition - Clinical stage from 10/01/2022: Stage IB (cT1c, cN1, cM0, G2, ER+, PR+, HER2-) - Signed by [name], MD on 10/01/2022 Histopathologic type: Infiltrating duct carcinoma, NOS Stage prefix: Initial diagnosis Nuclear grade: G2 Histologic grading system: 3 grade  system   10/25/2022 -  Chemotherapy   Patient is on Treatment Plan : BREAST ADJUVANT DOSE DENSE AC q14d / PACLitaxel q7d      Genetic Testing   No pathogenic variants identified on the [facility] Common Hereditary Cancers+RNA panel. VUS in PDGFRA called c.502G>C identified. The report date is 01/16/2023.  The Common Hereditary Cancers Panel + RNA offered by [facility] includes sequencing and/or deletion duplication testing of the following 48 genes: APC*, ATM*, AXIN2, BAP1, BARD1, BMPR1A, BRCA1, BRCA2, BRIP1, CDH1, CDK4, CDKN2A (p14ARF), CDKN2A (p16INK4a), CHEK2, CTNNA1, DICER1*, EPCAM*, FH*, GREM1*, HOXB13, KIT, MBD4, MEN1*, MLH1*, MSH2*, MSH3*, MSH6*, MUTYH, NF1*, NTHL1, PALB2, PDGFRA, PMS2*, POLD1*, POLE, PTEN*, RAD51C, RAD51D, SDHA*, SDHB, SDHC*, SDHD, SMAD4, SMARCA4, STK11, TP53, TSC1*, TSC2, VHL.       INTERVAL HISTORY:   Diana Jensen is a 47 y.o. female presenting to clinic today for follow up of left breast cancer. She was last seen by me on 01/25/23.  Since her last visit, she had an Echo done on 01/31/23.   Today, she states that she is doing well overall. Her appetite level is at 100%. Her energy level is at 50%.  PAST MEDICAL HISTORY:   Past Medical History: Past Medical History:  Diagnosis Date   Allergy    seasonal   Antiphospholipid syndrome (HCC)    Anxiety    Arthritis    deterioration of spine, knees   Carpal tunnel syndrome of right wrist    Clotting disorder (HCC)    antiphospholipid syndrome   Depression    Encounter for general adult medical examination with abnormal findings 07/03/2022   Essential hypertension, benign 02/10/2020   Hyperlipidemia 08/02/2022   Sleep disorder 02/22/2016    Surgical History: Past Surgical History:  Procedure Laterality Date   BREAST BIOPSY Left 08/10/2022   [place] LT BREAST BX W LOC DEV 1ST LESION IMG BX SPEC US GUIDE 08/10/2022 [facility]   BREAST BIOPSY Left 08/18/2022   MM LT BREAST BX W LOC DEV EA AD LESION IMG BX SPEC STEREO GUIDE  08/18/2022 [facility]   BREAST BIOPSY Left 08/18/2022   MM LT BREAST BX W LOC DEV 1ST LESION IMAGE BX SPEC STEREO GUIDE 08/18/2022 [facility]   CARPAL TUNNEL RELEASE Right 01/09/2019   Procedure: RIGHT CARPAL TUNNEL RELEASE;  Surgeon: Vickki Hearing, MD;  Location: [facility];  Service: Orthopedics;  Laterality: Right;   DILATION AND CURETTAGE OF UTERUS     x2   EYE SURGERY     lazy eye   IRRIGATION AND DEBRIDEMENT HEMATOMA Left 10/05/2022   Procedure: HEMATOMA EVACUATION OF LEFT BREAST WITH JP DRAIN PLACEMENT;  Surgeon: [name], MD;  Location: [facility];  Service: General;  Laterality: Left;   PARTIAL HYSTERECTOMY  2011   PORTACATH PLACEMENT Right 10/05/2022   Procedure: INSERTION PORT-A-CATH;  Surgeon: [name], MD;  Location: [facility];  Service: General;  Laterality: Right;   SIMPLE MASTECTOMY WITH AXILLARY SENTINEL NODE BIOPSY Left 08/28/2022   Procedure: SIMPLE MASTECTOMY WITH AXILLARY SENTINEL NODE BIOPSY;  Surgeon: Algis Greenhouse  C, MD;  Location: [facility];  Service: General;  Laterality: Left;    Social History: Social History   Socioeconomic History   Marital status: Married    Spouse name: Not on file   Number of children: 2   Years of education: Not on file   Highest education level: Not on file  Occupational History   Not on file  Tobacco Use   Smoking status: Every Day    Current packs/day: 0.50    Average packs/day: 0.5 packs/day for 28.0 years (14.0 ttl pk-yrs)    Types: Cigarettes   Smokeless tobacco: Never   Tobacco comments:    She has been smoking since age 78, smokes half a pack daily,was doing one pack a day until last year   Vaping Use   Vaping status: Never Used  Substance and Sexual Activity   Alcohol use: No   Drug use: No   Sexual activity: Yes    Birth control/protection: Surgical  Other Topics Concern   Not on file  Social History Narrative   Associates degree in Engineer, site. 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 Has 2 children 1 son and 1 daughter. Married x 15 years.   Social Determinants of Health   Financial Resource Strain: High Risk (11/09/2022)   Overall Financial Resource Strain (CARDIA)    Difficulty of Paying Living Expenses: Hard  Food Insecurity: No Food Insecurity (08/30/2022)   Hunger Vital Sign    Worried About Running Out of Food in the Last Year: Never true    Ran Out of Food in the Last Year: Never true  Transportation Needs: No Transportation Needs (08/30/2022)   PRAPARE - Administrator, Civil Service (Medical): No    Lack of Transportation (Non-Medical): No  Physical Activity: Not on file  Stress: Not on file  Social Connections: Not on file  Intimate Partner Violence: Not At Risk (08/30/2022)   Humiliation, Afraid, Rape, and Kick questionnaire    Fear of Current or Ex-Partner: No    Emotionally Abused: No    Physically Abused: No    Sexually Abused: No    Family History: Family History  Problem Relation Age of Onset   Breast cancer Mother 19   Arthritis Mother    Hypertension Mother    Osteoporosis Mother    Thyroid disease Father    Hypertension Father    Skin cancer Father    Mental illness Sister        depression ? bipolar   Dementia Maternal Grandmother    Prostate cancer Maternal Grandfather        prostate   Heart disease Paternal Grandmother    Colon cancer Paternal Grandfather    Mental illness Daughter    Pancreatic cancer Cousin        maternal cousin   Cancer Cousin        paternal, unknown type   Cancer Cousin        paternal, unknown type   Cancer - Lung Neg Hx    Cervical cancer Neg Hx     Current Medications:  Current Outpatient Medications:    acetaminophen (TYLENOL) 500 MG tablet, Take 1,000 mg by mouth every 6 (six) hours as needed for headache., Disp: , Rfl:    Cholecalciferol (VITAMIN D-3) 25 MCG (1000 UT) CAPS, Take 1 capsule by mouth daily., Disp: , Rfl:    escitalopram (LEXAPRO) 10 MG  tablet, Take 1 tablet (10 mg total) by  mouth daily., Disp: 90 tablet, Rfl: 1   hydrOXYzine (VISTARIL) 50 MG capsule, Take 1 capsule (50 mg total) by mouth daily as needed for anxiety., Disp: 30 capsule, Rfl: 2   lidocaine-prilocaine (EMLA) cream, Apply to affected area once, Disp: 30 g, Rfl: 3   loratadine (CLARITIN) 10 MG tablet, Take 10 mg by mouth daily., Disp: , Rfl:    losartan (COZAAR) 25 MG tablet, Take 1 tablet (25 mg total) by mouth daily., Disp: 90 tablet, Rfl: 1   ondansetron (ZOFRAN-ODT) 4 MG disintegrating tablet, Take 1 tablet (4 mg total) by mouth every 6 (six) hours as needed for nausea., Disp: 20 tablet, Rfl: 0   oxyCODONE (OXY IR/ROXICODONE) 5 MG immediate release tablet, Take 1-2 tablets (5-10 mg total) by mouth every 4 (four) hours as needed for severe pain or breakthrough pain., Disp: 15 tablet, Rfl: 0   prochlorperazine (COMPAZINE) 10 MG tablet, TAKE ONE TABLET BY MOUTH EVERY 6 HOURS AS NEEDED FOR NAUSEA AND VOMITING, Disp: 60 tablet, Rfl: 3 No current facility-administered medications for this visit.  Facility-Administered Medications Ordered in Other Visits:    sodium chloride flush (NS) 0.9 % injection 10 mL, 10 mL, Intracatheter, PRN, [name], MD, 10 mL at 02/08/23 1420   Allergies: No Known Allergies  REVIEW OF SYSTEMS:   Review of Systems  Constitutional:  Negative for chills, fatigue and fever.  HENT:   Negative for lump/mass, mouth sores, nosebleeds, sore throat and trouble swallowing.   Eyes:  Negative for eye problems.  Respiratory:  Negative for cough and shortness of breath.   Cardiovascular:  Positive for palpitations. Negative for chest pain and leg swelling.  Gastrointestinal:  Positive for nausea. Negative for abdominal pain, constipation, diarrhea and vomiting.  Genitourinary:  Negative for bladder incontinence, difficulty urinating, dysuria, frequency, hematuria and nocturia.   Musculoskeletal:  Negative for arthralgias, back pain, flank  pain, myalgias and neck pain.  Skin:  Negative for itching and rash.  Neurological:  Positive for dizziness and numbness. Negative for headaches.  Hematological:  Does not bruise/bleed easily.  Psychiatric/Behavioral:  Negative for depression, sleep disturbance and suicidal ideas. The patient is not nervous/anxious.   All other systems reviewed and are negative.    VITALS:   Blood pressure 117/74.  Wt Readings from Last 3 Encounters:  02/08/23 265 lb 12.8 oz (120.6 kg)  02/01/23 266 lb 6.4 oz (120.8 kg)  01/29/23 263 lb (119.3 kg)    There is no height or weight on file to calculate BMI.  Performance status (ECOG): 1 - Symptomatic but completely ambulatory  PHYSICAL EXAM:   Physical Exam Vitals and nursing note reviewed. Exam conducted with a chaperone present.  Constitutional:      Appearance: Normal appearance.  Cardiovascular:     Rate and Rhythm: Normal rate and regular rhythm.     Pulses: Normal pulses.     Heart sounds: Normal heart sounds.  Pulmonary:     Effort: Pulmonary effort is normal.     Breath sounds: Normal breath sounds.  Abdominal:     Palpations: Abdomen is soft. There is no hepatomegaly, splenomegaly or mass.     Tenderness: There is no abdominal tenderness.  Musculoskeletal:     Right lower leg: No edema.     Left lower leg: No edema.  Lymphadenopathy:     Cervical: No cervical adenopathy.     Right cervical: No superficial, deep or posterior cervical adenopathy.    Left cervical: No superficial, deep or posterior cervical  adenopathy.     Upper Body:     Right upper body: No supraclavicular or axillary adenopathy.     Left upper body: No supraclavicular or axillary adenopathy.  Neurological:     General: No focal deficit present.     Mental Status: She is alert and oriented to person, place, and time.  Psychiatric:        Mood and Affect: Mood normal.        Behavior: Behavior normal.     LABS:      Latest Ref Rng & Units 02/08/2023    11:04 AM 02/01/2023    8:44 AM 01/25/2023   10:00 AM  CBC  WBC 4.0 - 10.5 K/uL 7.1  5.6  7.1   Hemoglobin 12.0 - 15.0 g/dL 47.8  29.5  62.1   Hematocrit 36.0 - 46.0 % 36.6  33.9  35.6   Platelets 150 - 400 K/uL 304  287  290       Latest Ref Rng & Units 02/08/2023   11:04 AM 02/01/2023    8:44 AM 01/25/2023   10:00 AM  CMP  Glucose 70 - 99 mg/dL 308  657  846   BUN 6 - 20 mg/dL 11  12  11    Creatinine 0.44 - 1.00 mg/dL 9.62  9.52  8.41   Sodium 135 - 145 mmol/L 137  137  136   Potassium 3.5 - 5.1 mmol/L 3.5  3.4  3.8   Chloride 98 - 111 mmol/L 102  104  104   CO2 22 - 32 mmol/L 22  22  23    Calcium 8.9 - 10.3 mg/dL 8.9  8.6  8.7   Total Protein 6.5 - 8.1 g/dL 6.7  6.4  7.0   Total Bilirubin 0.3 - 1.2 mg/dL 0.6  0.5  0.7   Alkaline Phos 38 - 126 U/L 74  72  84   AST 15 - 41 U/L 56  44  63   ALT 0 - 44 U/L 92  74  117      No results found for: "CEA1", "CEA" / No results found for: "CEA1", "CEA" No results found for: "PSA1" No results found for: "LKG401" No results found for: "CAN125"  No results found for: "TOTALPROTELP", "ALBUMINELP", "A1GS", "A2GS", "BETS", "BETA2SER", "GAMS", "MSPIKE", "SPEI" No results found for: "TIBC", "FERRITIN", "IRONPCTSAT" No results found for: "LDH"   STUDIES:   ECHOCARDIOGRAM COMPLETE  Result Date: 01/31/2023    ECHOCARDIOGRAM REPORT   Patient Name:   Diana Jensen Date of Exam: 01/31/2023 Medical Rec #:  [id number]     Height:       67.0 in Accession #:    [id number]    Weight:       263.0 lb Date of Birth:  23-May-1976     BSA:          2.272 m Patient Age:    46 years      BP:           133/88 mmHg Patient Gender: F             HR:           80 bpm. Exam Location:  Outpatient Procedure: 2D Echo, Cardiac Doppler, Color Doppler and Strain Analysis Indications:    palpitations  History:        Patient has no prior history of Echocardiogram examinations.  Risk Factors:Hypertension and Current Smoker.  Sonographer:    Milda Smart Referring  Phys: [id number]   IMPRESSIONS  1. Left ventricular ejection fraction, by estimation, is 50 to 55%. The left ventricle has low normal function. The left ventricle has no regional wall motion abnormalities. Left ventricular diastolic parameters are indeterminate.  2. Right ventricular systolic function is normal. The right ventricular size is normal.  3. The mitral valve is normal in structure. Trivial mitral valve regurgitation. No evidence of mitral stenosis.  4. The aortic valve is normal in structure. Aortic valve regurgitation is not visualized. No aortic stenosis is present.  5. The inferior vena cava is normal in size with greater than 50% respiratory variability, suggesting right atrial pressure of 3 mmHg. FINDINGS  Left Ventricle: Left ventricular ejection fraction, by estimation, is 50 to 55%. The left ventricle has low normal function. The left ventricle has no regional wall motion abnormalities. Global longitudinal strain performed but not reported based on interpreter judgement due to suboptimal tracking. The left ventricular internal cavity size was normal in size. There is no left ventricular hypertrophy. Left ventricular diastolic parameters are indeterminate. Right Ventricle: The right ventricular size is normal. No increase in right ventricular wall thickness. Right ventricular systolic function is normal. Left Atrium: Left atrial size was normal in size. Right Atrium: Right atrial size was normal in size. Pericardium: There is no evidence of pericardial effusion. Presence of epicardial fat layer. Mitral Valve: The mitral valve is normal in structure. Trivial mitral valve regurgitation. No evidence of mitral valve stenosis. MV peak gradient, 2.8 mmHg. The mean mitral valve gradient is 2.0 mmHg. Tricuspid Valve: The tricuspid valve is normal in structure. Tricuspid valve regurgitation is mild . No evidence of tricuspid stenosis. Aortic Valve: The aortic valve is normal in structure.  Aortic valve regurgitation is not visualized. No aortic stenosis is present. Pulmonic Valve: The pulmonic valve was not well visualized. Pulmonic valve regurgitation is trivial. No evidence of pulmonic stenosis. Aorta: The aortic root is normal in size and structure. Venous: The inferior vena cava is normal in size with greater than 50% respiratory variability, suggesting right atrial pressure of 3 mmHg. IAS/Shunts: No atrial level shunt detected by color flow Doppler.  LEFT VENTRICLE PLAX 2D LVIDd:         4.70 cm      Diastology LVIDs:         3.40 cm      LV e' medial:    9.14 cm/s LV PW:         1.00 cm      LV E/e' medial:  9.6 LV IVS:        0.90 cm      LV e' lateral:   9.57 cm/s LVOT diam:     2.10 cm      LV E/e' lateral: 9.2 LV SV:         79 LV SV Index:   35 LVOT Area:     3.46 cm  LV Volumes (MOD) LV vol d, MOD A2C: 95.4 ml LV vol d, MOD A4C: 105.0 ml LV vol s, MOD A2C: 41.0 ml LV vol s, MOD A4C: 49.9 ml LV SV MOD A2C:     54.4 ml LV SV MOD A4C:     105.0 ml LV SV MOD BP:      57.0 ml RIGHT VENTRICLE RV S prime:     11.30 cm/s TAPSE (M-mode): 1.9 cm LEFT ATRIUM  Index        RIGHT ATRIUM           Index LA diam:        2.90 cm 1.28 cm/m   RA Area:     11.00 cm LA Vol (A2C):   41.8 ml 18.40 ml/m  RA Volume:   21.40 ml  9.42 ml/m LA Vol (A4C):   27.3 ml 12.02 ml/m LA Biplane Vol: 34.4 ml 15.14 ml/m  AORTIC VALVE LVOT Vmax:   115.00 cm/s LVOT Vmean:  84.700 cm/s LVOT VTI:    0.229 m  AORTA Ao Root diam: 3.20 cm Ao Asc diam:  3.60 cm MITRAL VALVE MV Area (PHT): 3.17 cm    SHUNTS MV Area VTI:   3.42 cm    Systemic VTI:  0.23 m MV Peak grad:  2.8 mmHg    Systemic Diam: 2.10 cm MV Mean grad:  2.0 mmHg MV Vmax:       0.84 m/s MV Vmean:      65.5 cm/s MV Decel Time: 239 msec MV E velocity: 87.80 cm/s MV A velocity: 71.10 cm/s MV E/A ratio:  1.23 Kardie Tobb DO Electronically signed by Thomasene Ripple DO Signature Date/Time: 01/31/2023/4:20:41 PM    Final

## 2023-02-08 ENCOUNTER — Inpatient Hospital Stay: Payer: Managed Care, Other (non HMO)

## 2023-02-08 ENCOUNTER — Inpatient Hospital Stay: Payer: Managed Care, Other (non HMO) | Admitting: Hematology

## 2023-02-08 VITALS — BP 111/71 | HR 86 | Temp 99.3°F | Resp 18

## 2023-02-08 DIAGNOSIS — Z170 Estrogen receptor positive status [ER+]: Secondary | ICD-10-CM | POA: Diagnosis not present

## 2023-02-08 DIAGNOSIS — C50212 Malignant neoplasm of upper-inner quadrant of left female breast: Secondary | ICD-10-CM

## 2023-02-08 DIAGNOSIS — Z5111 Encounter for antineoplastic chemotherapy: Secondary | ICD-10-CM | POA: Diagnosis not present

## 2023-02-08 LAB — CBC WITH DIFFERENTIAL/PLATELET
Abs Immature Granulocytes: 0.14 10*3/uL — ABNORMAL HIGH (ref 0.00–0.07)
Basophils Absolute: 0.1 10*3/uL (ref 0.0–0.1)
Basophils Relative: 1 %
Eosinophils Absolute: 0.3 10*3/uL (ref 0.0–0.5)
Eosinophils Relative: 4 %
HCT: 36.6 % (ref 36.0–46.0)
Hemoglobin: 11.8 g/dL — ABNORMAL LOW (ref 12.0–15.0)
Immature Granulocytes: 2 %
Lymphocytes Relative: 17 %
Lymphs Abs: 1.2 10*3/uL (ref 0.7–4.0)
MCH: 29.4 pg (ref 26.0–34.0)
MCHC: 32.2 g/dL (ref 30.0–36.0)
MCV: 91.3 fL (ref 80.0–100.0)
Monocytes Absolute: 0.3 10*3/uL (ref 0.1–1.0)
Monocytes Relative: 4 %
Neutro Abs: 5.1 10*3/uL (ref 1.7–7.7)
Neutrophils Relative %: 72 %
Platelets: 304 10*3/uL (ref 150–400)
RBC: 4.01 MIL/uL (ref 3.87–5.11)
RDW: 17.1 % — ABNORMAL HIGH (ref 11.5–15.5)
WBC: 7.1 10*3/uL (ref 4.0–10.5)
nRBC: 0 % (ref 0.0–0.2)

## 2023-02-08 LAB — COMPREHENSIVE METABOLIC PANEL
ALT: 92 U/L — ABNORMAL HIGH (ref 0–44)
AST: 56 U/L — ABNORMAL HIGH (ref 15–41)
Albumin: 3.8 g/dL (ref 3.5–5.0)
Alkaline Phosphatase: 74 U/L (ref 38–126)
Anion gap: 13 (ref 5–15)
BUN: 11 mg/dL (ref 6–20)
CO2: 22 mmol/L (ref 22–32)
Calcium: 8.9 mg/dL (ref 8.9–10.3)
Chloride: 102 mmol/L (ref 98–111)
Creatinine, Ser: 0.78 mg/dL (ref 0.44–1.00)
GFR, Estimated: >60 mL/min (ref >60)
Glucose, Bld: 159 mg/dL — ABNORMAL HIGH (ref 70–99)
Potassium: 3.5 mmol/L (ref 3.5–5.1)
Sodium: 137 mmol/L (ref 135–145)
Total Bilirubin: 0.6 mg/dL (ref 0.3–1.2)
Total Protein: 6.7 g/dL (ref 6.5–8.1)

## 2023-02-08 LAB — MAGNESIUM: Magnesium: 2.1 mg/dL (ref 1.7–2.4)

## 2023-02-08 MED ORDER — SODIUM CHLORIDE 0.9% FLUSH
10.0000 mL | Freq: Once | INTRAVENOUS | Status: AC
  Administered 2023-02-08: 10 mL via INTRAVENOUS

## 2023-02-08 MED ORDER — HEPARIN SOD (PORK) LOCK FLUSH 100 UNIT/ML IV SOLN
500.0000 [IU] | Freq: Once | INTRAVENOUS | Status: AC | PRN
  Administered 2023-02-08: 500 [IU]

## 2023-02-08 MED ORDER — SODIUM CHLORIDE 0.9 % IV SOLN
10.0000 mg | Freq: Once | INTRAVENOUS | Status: AC
  Administered 2023-02-08: 10 mg via INTRAVENOUS
  Filled 2023-02-08: qty 10

## 2023-02-08 MED ORDER — FAMOTIDINE IN NACL 20-0.9 MG/50ML-% IV SOLN
20.0000 mg | Freq: Once | INTRAVENOUS | Status: AC
  Administered 2023-02-08: 20 mg via INTRAVENOUS
  Filled 2023-02-08: qty 50

## 2023-02-08 MED ORDER — SODIUM CHLORIDE 0.9% FLUSH
10.0000 mL | INTRAVENOUS | Status: DC | PRN
  Administered 2023-02-08: 10 mL

## 2023-02-08 MED ORDER — SODIUM CHLORIDE 0.9 % IV SOLN
64.0000 mg/m2 | Freq: Once | INTRAVENOUS | Status: AC
  Administered 2023-02-08: 156 mg via INTRAVENOUS
  Filled 2023-02-08: qty 26

## 2023-02-08 MED ORDER — SODIUM CHLORIDE 0.9 % IV SOLN: Freq: Once | INTRAVENOUS | Status: AC

## 2023-02-08 MED ORDER — ONDANSETRON HCL 4 MG/2ML IJ SOLN
8.0000 mg | Freq: Once | INTRAMUSCULAR | Status: AC
  Administered 2023-02-08: 8 mg via INTRAVENOUS
  Filled 2023-02-08: qty 4

## 2023-02-08 MED ORDER — CETIRIZINE HCL 10 MG/ML IV SOLN
10.0000 mg | Freq: Once | INTRAVENOUS | Status: AC
  Administered 2023-02-08: 10 mg via INTRAVENOUS
  Filled 2023-02-08: qty 1

## 2023-02-08 NOTE — Progress Notes (Signed)
Patient has been examined by Dr. Katragadda. Vital signs and labs have been reviewed by MD - ANC, Creatinine, LFTs, hemoglobin, and platelets are within treatment parameters per M.D. - pt may proceed with treatment.  Primary RN and pharmacy notified.  

## 2023-02-08 NOTE — Patient Instructions (Signed)

## 2023-02-08 NOTE — Progress Notes (Signed)
Patient presents today for Paclitaxel infusion. Patient is in satisfactory condition with no new complaints voiced.  Vital signs are stable.  Labs reviewed by Dr. Ellin Saba during the office visit and all labs are within treatment parameters.  We will proceed with treatment per MD orders.   Treatment given today per MD orders. Tolerated infusion without adverse affects. Vital signs stable. No complaints at this time. Discharged from clinic ambulatory in stable condition. Alert and oriented x 3. F/U with [facility] as scheduled.

## 2023-02-08 NOTE — Progress Notes (Signed)
Patients port flushed without difficulty.  Good blood return noted with no bruising or swelling noted at site.  Stable during access and blood draw.  Patient to remain accessed for treatment. 

## 2023-02-08 NOTE — Patient Instructions (Signed)
 [facility]  Discharge Instructions: Thank you for choosing [facility] to provide your oncology and hematology care.  If you have a lab appointment with the Cancer Center - please note that after April 8th, 2024, all labs will be drawn in the cancer center.  You do not have to check in or register with the main entrance as you have in the past but will complete your check-in in the cancer center.  Wear comfortable clothing and clothing appropriate for easy access to any Portacath or PICC line.   We strive to give you quality time with your provider. You may need to reschedule your appointment if you arrive late (15 or more minutes).  Arriving late affects you and other patients whose appointments are after yours.  Also, if you miss three or more appointments without notifying the office, you may be dismissed from the clinic at the provider's discretion.      For prescription refill requests, have your pharmacy contact our office and allow 72 hours for refills to be completed.    Today you received the following chemotherapy and/or immunotherapy agents Paclitaxel     To help prevent nausea and vomiting after your treatment, we encourage you to take your nausea medication as directed.  Paclitaxel Injection What is this medication? PACLITAXEL (PAK li TAX el) treats some types of cancer. It works by slowing down the growth of cancer cells. This medicine may be used for other purposes; ask your health care provider or pharmacist if you have questions. COMMON BRAND NAME(S): Onxol, Taxol What should I tell my care team before I take this medication? They need to know if you have any of these conditions: Heart disease Liver disease Low white blood cell levels An unusual or allergic reaction to paclitaxel, other medications, foods, dyes, or preservatives If you or your partner are pregnant or trying to get pregnant Breast-feeding How should I use this  medication? This medication is injected into a vein. It is given by your care team in a hospital or clinic setting. Talk to your care team about the use of this medication in children. While it may be given to children for selected conditions, precautions do apply. Overdosage: If you think you have taken too much of this medicine contact a poison control center or emergency room at once. NOTE: This medicine is only for you. Do not share this medicine with others. What if I miss a dose? Keep appointments for follow-up doses. It is important not to miss your dose. Call your care team if you are unable to keep an appointment. What may interact with this medication? Do not take this medication with any of the following: Live virus vaccines Other medications may affect the way this medication works. Talk with your care team about all of the medications you take. They may suggest changes to your treatment plan to lower the risk of side effects and to make sure your medications work as intended. This list may not describe all possible interactions. Give your health care provider a list of all the medicines, herbs, non-prescription drugs, or dietary supplements you use. Also tell them if you smoke, drink alcohol, or use illegal drugs. Some items may interact with your medicine. What should I watch for while using this medication? Your condition will be monitored carefully while you are receiving this medication. You may need blood work while taking this medication. This medication may make you feel generally unwell. This is not  uncommon as chemotherapy can affect healthy cells as well as cancer cells. Report any side effects. Continue your course of treatment even though you feel ill unless your care team tells you to stop. This medication can cause serious allergic reactions. To reduce the risk, your care team may give you other medications to take before receiving this one. Be sure to follow the directions  from your care team. This medication may increase your risk of getting an infection. Call your care team for advice if you get a fever, chills, sore throat, or other symptoms of a cold or flu. Do not treat yourself. Try to avoid being around people who are sick. This medication may increase your risk to bruise or bleed. Call your care team if you notice any unusual bleeding. Be careful brushing or flossing your teeth or using a toothpick because you may get an infection or bleed more easily. If you have any dental work done, tell your dentist you are receiving this medication. Talk to your care team if you may be pregnant. Serious birth defects can occur if you take this medication during pregnancy. Talk to your care team before breastfeeding. Changes to your treatment plan may be needed. What side effects may I notice from receiving this medication? Side effects that you should report to your care team as soon as possible: Allergic reactions--skin rash, itching, hives, swelling of the face, lips, tongue, or throat Heart rhythm changes--fast or irregular heartbeat, dizziness, feeling faint or lightheaded, chest pain, trouble breathing Increase in blood pressure Infection--fever, chills, cough, sore throat, wounds that don't heal, pain or trouble when passing urine, general feeling of discomfort or being unwell Low blood pressure--dizziness, feeling faint or lightheaded, blurry vision Low red blood cell level--unusual weakness or fatigue, dizziness, headache, trouble breathing Painful swelling, warmth, or redness of the skin, blisters or sores at the infusion site Pain, tingling, or numbness in the hands or feet Slow heartbeat--dizziness, feeling faint or lightheaded, confusion, trouble breathing, unusual weakness or fatigue Unusual bruising or bleeding Side effects that usually do not require medical attention (report to your care team if they continue or are bothersome): Diarrhea Hair  loss Joint pain Loss of appetite Muscle pain Nausea Vomiting This list may not describe all possible side effects. Call your doctor for medical advice about side effects. You may report side effects to FDA at 1-800-FDA-1088. Where should I keep my medication? This medication is given in a hospital or clinic. It will not be stored at home. NOTE: This sheet is a summary. It may not cover all possible information. If you have questions about this medicine, talk to your doctor, pharmacist, or health care provider.  2024 Elsevier/Gold Standard (2021-11-01 00:00:00)   BELOW ARE SYMPTOMS THAT SHOULD BE REPORTED IMMEDIATELY: *FEVER GREATER THAN 100.4 F (38 C) OR HIGHER *CHILLS OR SWEATING *NAUSEA AND VOMITING THAT IS NOT CONTROLLED WITH YOUR NAUSEA MEDICATION *UNUSUAL SHORTNESS OF BREATH *UNUSUAL BRUISING OR BLEEDING *URINARY PROBLEMS (pain or burning when urinating, or frequent urination) *BOWEL PROBLEMS (unusual diarrhea, constipation, pain near the anus) TENDERNESS IN MOUTH AND THROAT WITH OR WITHOUT PRESENCE OF ULCERS (sore throat, sores in mouth, or a toothache) UNUSUAL RASH, SWELLING OR PAIN  UNUSUAL VAGINAL DISCHARGE OR ITCHING   Items with * indicate a potential emergency and should be followed up as soon as possible or go to the Emergency Department if any problems should occur.  Please show the CHEMOTHERAPY ALERT CARD or IMMUNOTHERAPY ALERT CARD at check-in to the Emergency  Department and triage nurse.  Should you have questions after your visit or need to cancel or reschedule your appointment, please contact [facility] [phone]  and follow the prompts.  Office hours are 8:00 a.m. to 4:30 p.m. Monday - Friday. Please note that voicemails left after 4:00 p.m. may not be returned until the following business day.  We are closed weekends and major holidays. You have access to a nurse at all times for urgent questions. Please call the main number to the clinic  [phone] and follow the prompts.  For any non-urgent questions, you may also contact your provider using MyChart. We now offer e-Visits for anyone 60 and older to request care online for non-urgent symptoms. For details visit [email or web address].   Also download the MyChart app! Go to the app store, search "MyChart", open the app, select [place], and log in with your MyChart username and password.

## 2023-02-15 ENCOUNTER — Inpatient Hospital Stay: Payer: Managed Care, Other (non HMO)

## 2023-02-15 ENCOUNTER — Inpatient Hospital Stay: Payer: Managed Care, Other (non HMO) | Admitting: Hematology

## 2023-02-15 ENCOUNTER — Other Ambulatory Visit: Payer: Managed Care, Other (non HMO)

## 2023-02-15 ENCOUNTER — Ambulatory Visit: Payer: Managed Care, Other (non HMO) | Admitting: Hematology

## 2023-02-15 VITALS — BP 114/78 | HR 86 | Temp 97.0°F | Resp 18

## 2023-02-15 DIAGNOSIS — C50212 Malignant neoplasm of upper-inner quadrant of left female breast: Secondary | ICD-10-CM

## 2023-02-15 DIAGNOSIS — Z5111 Encounter for antineoplastic chemotherapy: Secondary | ICD-10-CM | POA: Diagnosis not present

## 2023-02-15 LAB — COMPREHENSIVE METABOLIC PANEL
ALT: 80 U/L — ABNORMAL HIGH (ref 0–44)
AST: 38 U/L (ref 15–41)
Albumin: 3.8 g/dL (ref 3.5–5.0)
Alkaline Phosphatase: 80 U/L (ref 38–126)
Anion gap: 9 (ref 5–15)
BUN: 13 mg/dL (ref 6–20)
CO2: 22 mmol/L (ref 22–32)
Calcium: 8.6 mg/dL — ABNORMAL LOW (ref 8.9–10.3)
Chloride: 104 mmol/L (ref 98–111)
Creatinine, Ser: 0.68 mg/dL (ref 0.44–1.00)
GFR, Estimated: >60 mL/min (ref >60)
Glucose, Bld: 108 mg/dL — ABNORMAL HIGH (ref 70–99)
Potassium: 4.1 mmol/L (ref 3.5–5.1)
Sodium: 135 mmol/L (ref 135–145)
Total Bilirubin: 0.8 mg/dL (ref 0.3–1.2)
Total Protein: 6.8 g/dL (ref 6.5–8.1)

## 2023-02-15 LAB — CBC WITH DIFFERENTIAL/PLATELET
Abs Immature Granulocytes: 0.17 10*3/uL — ABNORMAL HIGH (ref 0.00–0.07)
Basophils Absolute: 0.1 10*3/uL (ref 0.0–0.1)
Basophils Relative: 1 %
Eosinophils Absolute: 0.3 10*3/uL (ref 0.0–0.5)
Eosinophils Relative: 4 %
HCT: 36.5 % (ref 36.0–46.0)
Hemoglobin: 11.9 g/dL — ABNORMAL LOW (ref 12.0–15.0)
Immature Granulocytes: 3 %
Lymphocytes Relative: 14 %
Lymphs Abs: 1 10*3/uL (ref 0.7–4.0)
MCH: 29.8 pg (ref 26.0–34.0)
MCHC: 32.6 g/dL (ref 30.0–36.0)
MCV: 91.5 fL (ref 80.0–100.0)
Monocytes Absolute: 0.4 10*3/uL (ref 0.1–1.0)
Monocytes Relative: 5 %
Neutro Abs: 4.9 10*3/uL (ref 1.7–7.7)
Neutrophils Relative %: 73 %
Platelets: 321 10*3/uL (ref 150–400)
RBC: 3.99 MIL/uL (ref 3.87–5.11)
RDW: 16 % — ABNORMAL HIGH (ref 11.5–15.5)
WBC: 6.7 10*3/uL (ref 4.0–10.5)
nRBC: 0 % (ref 0.0–0.2)

## 2023-02-15 LAB — MAGNESIUM: Magnesium: 2.1 mg/dL (ref 1.7–2.4)

## 2023-02-15 MED ORDER — HEPARIN SOD (PORK) LOCK FLUSH 100 UNIT/ML IV SOLN
500.0000 [IU] | Freq: Once | INTRAVENOUS | Status: AC | PRN
  Administered 2023-02-15: 500 [IU]

## 2023-02-15 MED ORDER — CETIRIZINE HCL 10 MG/ML IV SOLN
10.0000 mg | Freq: Once | INTRAVENOUS | Status: AC
  Administered 2023-02-15: 10 mg via INTRAVENOUS
  Filled 2023-02-15: qty 1

## 2023-02-15 MED ORDER — FAMOTIDINE IN NACL 20-0.9 MG/50ML-% IV SOLN
20.0000 mg | Freq: Once | INTRAVENOUS | Status: AC
  Administered 2023-02-15: 20 mg via INTRAVENOUS
  Filled 2023-02-15: qty 50

## 2023-02-15 MED ORDER — SODIUM CHLORIDE 0.9 % IV SOLN
10.0000 mg | Freq: Once | INTRAVENOUS | Status: AC
  Administered 2023-02-15: 10 mg via INTRAVENOUS
  Filled 2023-02-15: qty 10

## 2023-02-15 MED ORDER — SODIUM CHLORIDE 0.9% FLUSH
10.0000 mL | INTRAVENOUS | Status: DC | PRN
  Administered 2023-02-15: 10 mL

## 2023-02-15 MED ORDER — ONDANSETRON HCL 4 MG/2ML IJ SOLN
8.0000 mg | Freq: Once | INTRAMUSCULAR | Status: AC
  Administered 2023-02-15: 8 mg via INTRAVENOUS
  Filled 2023-02-15: qty 4

## 2023-02-15 MED ORDER — SODIUM CHLORIDE 0.9 % IV SOLN: Freq: Once | INTRAVENOUS | Status: AC

## 2023-02-15 MED ORDER — SODIUM CHLORIDE 0.9 % IV SOLN
64.0000 mg/m2 | Freq: Once | INTRAVENOUS | Status: AC
  Administered 2023-02-15: 156 mg via INTRAVENOUS
  Filled 2023-02-15: qty 26

## 2023-02-15 NOTE — Patient Instructions (Signed)
 [facility]  Discharge Instructions: Thank you for choosing [facility] to provide your oncology and hematology care.  If you have a lab appointment with the Cancer Center - please note that after April 8th, 2024, all labs will be drawn in the cancer center.  You do not have to check in or register with the main entrance as you have in the past but will complete your check-in in the cancer center.  Wear comfortable clothing and clothing appropriate for easy access to any Portacath or PICC line.   We strive to give you quality time with your provider. You may need to reschedule your appointment if you arrive late (15 or more minutes).  Arriving late affects you and other patients whose appointments are after yours.  Also, if you miss three or more appointments without notifying the office, you may be dismissed from the clinic at the provider's discretion.      For prescription refill requests, have your pharmacy contact our office and allow 72 hours for refills to be completed.    Today you received the following chemotherapy and/or immunotherapy agents taxol       To help prevent nausea and vomiting after your treatment, we encourage you to take your nausea medication as directed.  BELOW ARE SYMPTOMS THAT SHOULD BE REPORTED IMMEDIATELY: *FEVER GREATER THAN 100.4 F (38 C) OR HIGHER *CHILLS OR SWEATING *NAUSEA AND VOMITING THAT IS NOT CONTROLLED WITH YOUR NAUSEA MEDICATION *UNUSUAL SHORTNESS OF BREATH *UNUSUAL BRUISING OR BLEEDING *URINARY PROBLEMS (pain or burning when urinating, or frequent urination) *BOWEL PROBLEMS (unusual diarrhea, constipation, pain near the anus) TENDERNESS IN MOUTH AND THROAT WITH OR WITHOUT PRESENCE OF ULCERS (sore throat, sores in mouth, or a toothache) UNUSUAL RASH, SWELLING OR PAIN  UNUSUAL VAGINAL DISCHARGE OR ITCHING   Items with * indicate a potential emergency and should be followed up as soon as possible or go to the  Emergency Department if any problems should occur.  Please show the CHEMOTHERAPY ALERT CARD or IMMUNOTHERAPY ALERT CARD at check-in to the Emergency Department and triage nurse.  Should you have questions after your visit or need to cancel or reschedule your appointment, please contact [facility] [phone]  and follow the prompts.  Office hours are 8:00 a.m. to 4:30 p.m. Monday - Friday. Please note that voicemails left after 4:00 p.m. may not be returned until the following business day.  We are closed weekends and major holidays. You have access to a nurse at all times for urgent questions. Please call the main number to the clinic [phone] and follow the prompts.  For any non-urgent questions, you may also contact your provider using MyChart. We now offer e-Visits for anyone 25 and older to request care online for non-urgent symptoms. For details visit [email or web address].   Also download the MyChart app! Go to the app store, search "MyChart", open the app, select [place], and log in with your MyChart username and password.

## 2023-02-15 NOTE — Progress Notes (Signed)

## 2023-02-21 NOTE — Progress Notes (Signed)
[facility] [address] [place] [address]    Clinic Day:  02/22/23    Referring physician: [name], MD  Patient Care Team: [name], MD as PCP - General (Internal Medicine) [name], MD as Consulting Physician (Hematology)   ASSESSMENT & PLAN:   Assessment: 1.  T1cN1 G2 left breast IDC, ER/PR positive, HER2 negative: - She felt a lump in her breast in December 2023. - Left breast subareolar mass at 9:00 biopsy (08/10/2022): grade 2 IDC, ER 95% strong intensity, PR 90% strong intensity, Ki-67 10%, HER2 1+ - Left mastectomy and SLNB (08/28/2022): 1.6 x 1.2 x 0.7 cm IDC, margins negative, LVI present, 1/1 lymph node with metastatic carcinoma, metastasis 15 mm, focal extracapsular extension.  PT1 cpN1a. 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  No prior history of arterial/venous thrombosis/strokes. - PET scan (10/19/2022): No evidence of hypermetabolic metastatic disease.  Diffuse hepatic steatosis. - 4 cycles of adjuvant AC from 10/25/2022 through 12/12/2022.  Weekly paclitaxel started on 12/27/2022, to be completed on 03/13/2023 - Germline mutation testing: Negative   2.  Social/family history: - Seen today with her husband and daughter.  She worked as a Conservation officer, nature.  Current active smoker, 5 to 7 cigarettes/day, started at age 5. - Mother had breast cancer at age 8.  Father had skin cancer.  Paternal grandfather had colon cancer.  Maternal grandfather had prostate cancer.  1 maternal cousin had pancreatic cancer.  2 paternal cousins have cancer.    Plan: 1.  T1cN1 G2 left breast IDC, ER/PR positive, HER2 negative: - She is tolerating weekly paclitaxel reasonably well. - Reviewed labs today: AST and ALT elevated with normal bilirubin.  CBC grossly normal. - Proceed with week #9 paclitaxel with 20% dose reduction.  She will complete 12 weeks on 03/13/2023. - We talked about referring her to radiation oncology. - We will  also start her on antiestrogen therapy after completion of chemotherapy.  She had hysterectomy many years ago.  We will obtain  FSH, LH and estradiol levels.  RTC 6 weeks for follow-up.   2.  Peripheral neuropathy: - Constant numbness in the fingertips is stable.  Occasional pins and needle sensation stable. - She does not require any medical intervention.   3.  High risk drug monitoring: - Echo on 01/31/2023: EF 50-55%, down from 55-60% in April. - She reports occasional palpitations. - She has an appointment with cardiology in October.   4.  Low vitamin D levels: - Continue vitamin D 1000 units daily.  Last vitamin D was normal.    Orders Placed This Encounter  Procedures   Follicle stimulating hormone    Standing Status:   Future    Standing Expiration Date:   02/22/2024   Luteinizing hormone    Standing Status:   Future    Standing Expiration Date:   02/22/2024   Estradiol    Standing Status:   Future    Standing Expiration Date:   02/22/2024      Mikeal Hawthorne R Teague,acting as a scribe for [name], MD.,have documented all relevant documentation on the behalf of [name], MD,as directed by  [name], MD while in the presence of [name], MD.  I, [name] MD, have reviewed the above documentation for accuracy and completeness, and I agree with the above.       [name], MD   8/29/20246:09 PM  CHIEF COMPLAINT:   Diagnosis: left breast cancer  Cancer Staging  Breast cancer of upper-inner quadrant of left female breast [facility]) Staging form: Breast, AJCC 8th Edition - Clinical stage from 10/01/2022: Stage IB (cT1c, cN1, cM0, G2, ER+, PR+, HER2-) - Signed by [name], MD on 10/01/2022    Prior Therapy: 1. left mastectomy and +SLNB with Dr. Algis Greenhouse on 08/28/22  2. Adjuvant AC, 4 cycles, 10/25/22 - 12/12/22  Current Therapy:  weekly paclitaxel    HISTORY OF PRESENT ILLNESS:   Oncology History   Breast cancer of upper-inner quadrant of left female breast (HCC)  10/01/2022 Initial Diagnosis   Breast cancer of upper-inner quadrant of left female breast   10/01/2022 Cancer Staging   Staging form: Breast, AJCC 8th Edition - Clinical stage from 10/01/2022: Stage IB (cT1c, cN1, cM0, G2, ER+, PR+, HER2-) - Signed by [name], MD on 10/01/2022 Histopathologic type: Infiltrating duct carcinoma, NOS Stage prefix: Initial diagnosis Nuclear grade: G2 Histologic grading system: 3 grade system   10/25/2022 -  Chemotherapy   Patient is on Treatment Plan : BREAST ADJUVANT DOSE DENSE AC q14d / PACLitaxel q7d      Genetic Testing   No pathogenic variants identified on the [facility] Common Hereditary Cancers+RNA panel. VUS in PDGFRA called c.502G>C identified. The report date is 01/16/2023.  The Common Hereditary Cancers Panel + RNA offered by [facility] includes sequencing and/or deletion duplication testing of the following 48 genes: APC*, ATM*, AXIN2, BAP1, BARD1, BMPR1A, BRCA1, BRCA2, BRIP1, CDH1, CDK4, CDKN2A (p14ARF), CDKN2A (p16INK4a), CHEK2, CTNNA1, DICER1*, EPCAM*, FH*, GREM1*, HOXB13, KIT, MBD4, MEN1*, MLH1*, MSH2*, MSH3*, MSH6*, MUTYH, NF1*, NTHL1, PALB2, PDGFRA, PMS2*, POLD1*, POLE, PTEN*, RAD51C, RAD51D, SDHA*, SDHB, SDHC*, SDHD, SMAD4, SMARCA4, STK11, TP53, TSC1*, TSC2, VHL.       INTERVAL HISTORY:   Ruhani is a 47 y.o. female presenting to clinic today for follow up of left breast cancer. She was last seen by me on 02/08/23.  Today, she states that she is doing well overall. Her appetite level is at 100%. Her energy level is at 75%. She is accompanied by her husband.   She c/o sinus issues and pressure in the ears, that she believes is due to allergies. She notes occasional flutters in the heart that have remained stable. She has an appointment with cardiology in 2 weeks. She has constant numbness in the fingertips and occasional tingling in the hands that are stable. She is not  currently taking any medication for neuropathy.   She had a partial hysterectomy in 2011 and did not have menstrual cycles before starting treatment due to this.   PAST MEDICAL HISTORY:   Past Medical History: Past Medical History:  Diagnosis Date   Allergy    seasonal   Antiphospholipid syndrome (HCC)    Anxiety    Arthritis    deterioration of spine, knees   Carpal tunnel syndrome of right wrist    Clotting disorder (HCC)    antiphospholipid syndrome   Depression    Encounter for general adult medical examination with abnormal findings 07/03/2022   Essential hypertension, benign 02/10/2020   Hyperlipidemia 08/02/2022   Sleep disorder 02/22/2016    Surgical History: Past Surgical History:  Procedure Laterality Date   BREAST BIOPSY Left 08/10/2022   [place] LT BREAST BX W LOC DEV 1ST LESION IMG BX SPEC US GUIDE 08/10/2022 [facility]   BREAST BIOPSY Left 08/18/2022   MM LT BREAST BX W LOC DEV EA AD LESION IMG BX SPEC STEREO GUIDE 08/18/2022 [facility]   BREAST BIOPSY Left 08/18/2022  MM LT BREAST BX W LOC DEV 1ST LESION IMAGE BX SPEC STEREO GUIDE 08/18/2022 [facility]   CARPAL TUNNEL RELEASE Right 01/09/2019   Procedure: RIGHT CARPAL TUNNEL RELEASE;  Surgeon: Vickki Hearing, MD;  Location: [facility];  Service: Orthopedics;  Laterality: Right;   DILATION AND CURETTAGE OF UTERUS     x2   EYE SURGERY     lazy eye   IRRIGATION AND DEBRIDEMENT HEMATOMA Left 10/05/2022   Procedure: HEMATOMA EVACUATION OF LEFT BREAST WITH JP DRAIN PLACEMENT;  Surgeon: [name], MD;  Location: [facility];  Service: General;  Laterality: Left;   PARTIAL HYSTERECTOMY  2011   PORTACATH PLACEMENT Right 10/05/2022   Procedure: INSERTION PORT-A-CATH;  Surgeon: [name], MD;  Location: [facility];  Service: General;  Laterality: Right;   SIMPLE MASTECTOMY WITH AXILLARY SENTINEL NODE BIOPSY Left 08/28/2022   Procedure: SIMPLE MASTECTOMY WITH AXILLARY SENTINEL NODE BIOPSY;   Surgeon: [name], MD;  Location: [facility];  Service: General;  Laterality: Left;    Social History: Social History   Socioeconomic History   Marital status: Married    Spouse name: Not on file   Number of children: 2   Years of education: Not on file   Highest education level: Not on file  Occupational History   Not on file  Tobacco Use   Smoking status: Every Day    Current packs/day: 0.50    Average packs/day: 0.5 packs/day for 28.0 years (14.0 ttl pk-yrs)    Types: Cigarettes   Smokeless tobacco: Never   Tobacco comments:    She has been smoking since age 27, smokes half a pack daily,was doing one pack a day until last year   Vaping Use   Vaping status: Never Used  Substance and Sexual Activity   Alcohol use: No   Drug use: No   Sexual activity: Yes    Birth control/protection: Surgical  Other Topics Concern   Not on file  Social History Narrative   Associates degree in Engineer, site. 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 Has 2 children 1 son and 1 daughter. Married x 15 years.   Social Determinants of Health   Financial Resource Strain: High Risk (11/09/2022)   Overall Financial Resource Strain (CARDIA)    Difficulty of Paying Living Expenses: Hard  Food Insecurity: No Food Insecurity (08/30/2022)   Hunger Vital Sign    Worried About Running Out of Food in the Last Year: Never true    Ran Out of Food in the Last Year: Never true  Transportation Needs: No Transportation Needs (08/30/2022)   PRAPARE - Administrator, Civil Service (Medical): No    Lack of Transportation (Non-Medical): No  Physical Activity: Not on file  Stress: Not on file  Social Connections: Not on file  Intimate Partner Violence: Not At Risk (08/30/2022)   Humiliation, Afraid, Rape, and Kick questionnaire    Fear of Current or Ex-Partner: No    Emotionally Abused: No    Physically Abused: No    Sexually Abused: No    Family History: Family  History  Problem Relation Age of Onset   Breast cancer Mother 66   Arthritis Mother    Hypertension Mother    Osteoporosis Mother    Thyroid disease Father    Hypertension Father    Skin cancer Father    Mental illness Sister        depression ? bipolar  Dementia Maternal Grandmother    Prostate cancer Maternal Grandfather        prostate   Heart disease Paternal Grandmother    Colon cancer Paternal Grandfather    Mental illness Daughter    Pancreatic cancer Cousin        maternal cousin   Cancer Cousin        paternal, unknown type   Cancer Cousin        paternal, unknown type   Cancer - Lung Neg Hx    Cervical cancer Neg Hx     Current Medications:  Current Outpatient Medications:    acetaminophen (TYLENOL) 500 MG tablet, Take 1,000 mg by mouth every 6 (six) hours as needed for headache., Disp: , Rfl:    Cholecalciferol (VITAMIN D-3) 25 MCG (1000 UT) CAPS, Take 1 capsule by mouth daily., Disp: , Rfl:    escitalopram (LEXAPRO) 10 MG tablet, Take 1 tablet (10 mg total) by mouth daily., Disp: 90 tablet, Rfl: 1   hydrOXYzine (VISTARIL) 50 MG capsule, Take 1 capsule (50 mg total) by mouth daily as needed for anxiety., Disp: 30 capsule, Rfl: 2   lidocaine-prilocaine (EMLA) cream, Apply to affected area once, Disp: 30 g, Rfl: 3   loratadine (CLARITIN) 10 MG tablet, Take 10 mg by mouth daily., Disp: , Rfl:    losartan (COZAAR) 25 MG tablet, Take 1 tablet (25 mg total) by mouth daily., Disp: 90 tablet, Rfl: 1   ondansetron (ZOFRAN-ODT) 4 MG disintegrating tablet, Take 1 tablet (4 mg total) by mouth every 6 (six) hours as needed for nausea., Disp: 20 tablet, Rfl: 0   oxyCODONE (OXY IR/ROXICODONE) 5 MG immediate release tablet, Take 1-2 tablets (5-10 mg total) by mouth every 4 (four) hours as needed for severe pain or breakthrough pain., Disp: 15 tablet, Rfl: 0   prochlorperazine (COMPAZINE) 10 MG tablet, TAKE ONE TABLET BY MOUTH EVERY 6 HOURS AS NEEDED FOR NAUSEA AND VOMITING, Disp:  60 tablet, Rfl: 3 No current facility-administered medications for this visit.  Facility-Administered Medications Ordered in Other Visits:    sodium chloride flush (NS) 0.9 % injection 10 mL, 10 mL, Intracatheter, PRN, [name], MD, 10 mL at 02/22/23 1324   Allergies: No Known Allergies  REVIEW OF SYSTEMS:   Review of Systems  Constitutional:  Positive for fatigue. Negative for chills and fever.  HENT:   Positive for nosebleeds. Negative for lump/mass, mouth sores, sore throat and trouble swallowing.   Eyes:  Negative for eye problems.  Respiratory:  Negative for cough and shortness of breath.   Cardiovascular:  Positive for palpitations. Negative for chest pain and leg swelling.  Gastrointestinal:  Positive for nausea and vomiting. Negative for abdominal pain, constipation and diarrhea.  Genitourinary:  Negative for bladder incontinence, difficulty urinating, dysuria, frequency, hematuria and nocturia.   Musculoskeletal:  Negative for arthralgias, back pain, flank pain, myalgias and neck pain.  Skin:  Negative for itching and rash.  Neurological:  Positive for dizziness, headaches and numbness.  Hematological:  Does not bruise/bleed easily.  Psychiatric/Behavioral:  Positive for depression. Negative for sleep disturbance and suicidal ideas. The patient is nervous/anxious.   All other systems reviewed and are negative.    VITALS:   There were no vitals taken for this visit.  Wt Readings from Last 3 Encounters:  02/22/23 265 lb 6.4 oz (120.4 kg)  02/15/23 262 lb 5.6 oz (119 kg)  02/08/23 265 lb 12.8 oz (120.6 kg)    There is no height or weight  on file to calculate BMI.  Performance status (ECOG): 1 - Symptomatic but completely ambulatory  PHYSICAL EXAM:   Physical Exam Vitals and nursing note reviewed. Exam conducted with a chaperone present.  Constitutional:      Appearance: Normal appearance.  Cardiovascular:     Rate and Rhythm: Normal rate and regular  rhythm.     Pulses: Normal pulses.     Heart sounds: Normal heart sounds.  Pulmonary:     Effort: Pulmonary effort is normal.     Breath sounds: Normal breath sounds.  Abdominal:     Palpations: Abdomen is soft. There is no hepatomegaly, splenomegaly or mass.     Tenderness: There is no abdominal tenderness.  Musculoskeletal:     Right lower leg: No edema.     Left lower leg: No edema.  Lymphadenopathy:     Cervical: No cervical adenopathy.     Right cervical: No superficial, deep or posterior cervical adenopathy.    Left cervical: No superficial, deep or posterior cervical adenopathy.     Upper Body:     Right upper body: No supraclavicular or axillary adenopathy.     Left upper body: No supraclavicular or axillary adenopathy.  Neurological:     General: No focal deficit present.     Mental Status: She is alert and oriented to person, place, and time.  Psychiatric:        Mood and Affect: Mood normal.        Behavior: Behavior normal.     LABS:      Latest Ref Rng & Units 02/22/2023    9:09 AM 02/15/2023    7:45 AM 02/08/2023   11:04 AM  CBC  WBC 4.0 - 10.5 K/uL 6.7  6.7  7.1   Hemoglobin 12.0 - 15.0 g/dL 40.9  81.1  91.4   Hematocrit 36.0 - 46.0 % 37.1  36.5  36.6   Platelets 150 - 400 K/uL 327  321  304       Latest Ref Rng & Units 02/22/2023    9:09 AM 02/15/2023    7:45 AM 02/08/2023   11:04 AM  CMP  Glucose 70 - 99 mg/dL 782  956  213   BUN 6 - 20 mg/dL 13  13  11    Creatinine 0.44 - 1.00 mg/dL 0.86  5.78  4.69   Sodium 135 - 145 mmol/L 138  135  137   Potassium 3.5 - 5.1 mmol/L 3.3  4.1  3.5   Chloride 98 - 111 mmol/L 105  104  102   CO2 22 - 32 mmol/L 21  22  22    Calcium 8.9 - 10.3 mg/dL 8.6  8.6  8.9   Total Protein 6.5 - 8.1 g/dL 6.7  6.8  6.7   Total Bilirubin 0.3 - 1.2 mg/dL 0.6  0.8  0.6   Alkaline Phos 38 - 126 U/L 78  80  74   AST 15 - 41 U/L 56  38  56   ALT 0 - 44 U/L 110  80  92      No results found for: "CEA1", "CEA" / No results found  for: "CEA1", "CEA" No results found for: "PSA1" No results found for: "GEX528" No results found for: "CAN125"  No results found for: "TOTALPROTELP", "ALBUMINELP", "A1GS", "A2GS", "BETS", "BETA2SER", "GAMS", "MSPIKE", "SPEI" No results found for: "TIBC", "FERRITIN", "IRONPCTSAT" No results found for: "LDH"   STUDIES:   ECHOCARDIOGRAM COMPLETE  Result Date: 01/31/2023  ECHOCARDIOGRAM REPORT   Patient Name:   AMONDA PLOTT Date of Exam: 01/31/2023 Medical Rec #:  [id number]     Height:       67.0 in Accession #:    [id number]    Weight:       263.0 lb Date of Birth:  1976-01-21     BSA:          2.272 m Patient Age:    46 years      BP:           133/88 mmHg Patient Gender: F             HR:           80 bpm. Exam Location:  Outpatient Procedure: 2D Echo, Cardiac Doppler, Color Doppler and Strain Analysis Indications:    palpitations  History:        Patient has no prior history of Echocardiogram examinations.                 Risk Factors:Hypertension and Current Smoker.  Sonographer:    Milda Smart Referring Phys: [id number] Lenzie Sandler IMPRESSIONS  1. Left ventricular ejection fraction, by estimation, is 50 to 55%. The left ventricle has low normal function. The left ventricle has no regional wall motion abnormalities. Left ventricular diastolic parameters are indeterminate.  2. Right ventricular systolic function is normal. The right ventricular size is normal.  3. The mitral valve is normal in structure. Trivial mitral valve regurgitation. No evidence of mitral stenosis.  4. The aortic valve is normal in structure. Aortic valve regurgitation is not visualized. No aortic stenosis is present.  5. The inferior vena cava is normal in size with greater than 50% respiratory variability, suggesting right atrial pressure of 3 mmHg. FINDINGS  Left Ventricle: Left ventricular ejection fraction, by estimation, is 50 to 55%. The left ventricle has low normal function. The left ventricle has no regional wall  motion abnormalities. Global longitudinal strain performed but not reported based on interpreter judgement due to suboptimal tracking. The left ventricular internal cavity size was normal in size. There is no left ventricular hypertrophy. Left ventricular diastolic parameters are indeterminate. Right Ventricle: The right ventricular size is normal. No increase in right ventricular wall thickness. Right ventricular systolic function is normal. Left Atrium: Left atrial size was normal in size. Right Atrium: Right atrial size was normal in size. Pericardium: There is no evidence of pericardial effusion. Presence of epicardial fat layer. Mitral Valve: The mitral valve is normal in structure. Trivial mitral valve regurgitation. No evidence of mitral valve stenosis. MV peak gradient, 2.8 mmHg. The mean mitral valve gradient is 2.0 mmHg. Tricuspid Valve: The tricuspid valve is normal in structure. Tricuspid valve regurgitation is mild . No evidence of tricuspid stenosis. Aortic Valve: The aortic valve is normal in structure. Aortic valve regurgitation is not visualized. No aortic stenosis is present. Pulmonic Valve: The pulmonic valve was not well visualized. Pulmonic valve regurgitation is trivial. No evidence of pulmonic stenosis. Aorta: The aortic root is normal in size and structure. Venous: The inferior vena cava is normal in size with greater than 50% respiratory variability, suggesting right atrial pressure of 3 mmHg. IAS/Shunts: No atrial level shunt detected by color flow Doppler.  LEFT VENTRICLE PLAX 2D LVIDd:         4.70 cm      Diastology LVIDs:         3.40 cm      LV e' medial:  9.14 cm/s LV PW:         1.00 cm      LV E/e' medial:  9.6 LV IVS:        0.90 cm      LV e' lateral:   9.57 cm/s LVOT diam:     2.10 cm      LV E/e' lateral: 9.2 LV SV:         79 LV SV Index:   35 LVOT Area:     3.46 cm  LV Volumes (MOD) LV vol d, MOD A2C: 95.4 ml LV vol d, MOD A4C: 105.0 ml LV vol s, MOD A2C: 41.0 ml LV vol s,  MOD A4C: 49.9 ml LV SV MOD A2C:     54.4 ml LV SV MOD A4C:     105.0 ml LV SV MOD BP:      57.0 ml RIGHT VENTRICLE RV S prime:     11.30 cm/s TAPSE (M-mode): 1.9 cm LEFT ATRIUM             Index        RIGHT ATRIUM           Index LA diam:        2.90 cm 1.28 cm/m   RA Area:     11.00 cm LA Vol (A2C):   41.8 ml 18.40 ml/m  RA Volume:   21.40 ml  9.42 ml/m LA Vol (A4C):   27.3 ml 12.02 ml/m LA Biplane Vol: 34.4 ml 15.14 ml/m  AORTIC VALVE LVOT Vmax:   115.00 cm/s LVOT Vmean:  84.700 cm/s LVOT VTI:    0.229 m  AORTA Ao Root diam: 3.20 cm Ao Asc diam:  3.60 cm MITRAL VALVE MV Area (PHT): 3.17 cm    SHUNTS MV Area VTI:   3.42 cm    Systemic VTI:  0.23 m MV Peak grad:  2.8 mmHg    Systemic Diam: 2.10 cm MV Mean grad:  2.0 mmHg MV Vmax:       0.84 m/s MV Vmean:      65.5 cm/s MV Decel Time: 239 msec MV E velocity: 87.80 cm/s MV A velocity: 71.10 cm/s MV E/A ratio:  1.23 Kardie Tobb DO Electronically signed by Thomasene Ripple DO Signature Date/Time: 01/31/2023/4:20:41 PM    Final

## 2023-02-22 ENCOUNTER — Inpatient Hospital Stay: Payer: Managed Care, Other (non HMO) | Admitting: Hematology

## 2023-02-22 ENCOUNTER — Inpatient Hospital Stay: Payer: Managed Care, Other (non HMO)

## 2023-02-22 VITALS — BP 117/77 | HR 85 | Temp 98.1°F | Resp 20

## 2023-02-22 DIAGNOSIS — Z170 Estrogen receptor positive status [ER+]: Secondary | ICD-10-CM

## 2023-02-22 DIAGNOSIS — C50212 Malignant neoplasm of upper-inner quadrant of left female breast: Secondary | ICD-10-CM

## 2023-02-22 DIAGNOSIS — Z5111 Encounter for antineoplastic chemotherapy: Secondary | ICD-10-CM | POA: Diagnosis not present

## 2023-02-22 DIAGNOSIS — E876 Hypokalemia: Secondary | ICD-10-CM

## 2023-02-22 LAB — COMPREHENSIVE METABOLIC PANEL
ALT: 110 U/L — ABNORMAL HIGH (ref 0–44)
AST: 56 U/L — ABNORMAL HIGH (ref 15–41)
Albumin: 3.9 g/dL (ref 3.5–5.0)
Alkaline Phosphatase: 78 U/L (ref 38–126)
Anion gap: 12 (ref 5–15)
BUN: 13 mg/dL (ref 6–20)
CO2: 21 mmol/L — ABNORMAL LOW (ref 22–32)
Calcium: 8.6 mg/dL — ABNORMAL LOW (ref 8.9–10.3)
Chloride: 105 mmol/L (ref 98–111)
Creatinine, Ser: 0.82 mg/dL (ref 0.44–1.00)
GFR, Estimated: >60 mL/min (ref >60)
Glucose, Bld: 176 mg/dL — ABNORMAL HIGH (ref 70–99)
Potassium: 3.3 mmol/L — ABNORMAL LOW (ref 3.5–5.1)
Sodium: 138 mmol/L (ref 135–145)
Total Bilirubin: 0.6 mg/dL (ref 0.3–1.2)
Total Protein: 6.7 g/dL (ref 6.5–8.1)

## 2023-02-22 LAB — CBC WITH DIFFERENTIAL/PLATELET
Abs Immature Granulocytes: 0.14 10*3/uL — ABNORMAL HIGH (ref 0.00–0.07)
Basophils Absolute: 0 10*3/uL (ref 0.0–0.1)
Basophils Relative: 1 %
Eosinophils Absolute: 0.3 10*3/uL (ref 0.0–0.5)
Eosinophils Relative: 4 %
HCT: 37.1 % (ref 36.0–46.0)
Hemoglobin: 12.1 g/dL (ref 12.0–15.0)
Immature Granulocytes: 2 %
Lymphocytes Relative: 14 %
Lymphs Abs: 1 10*3/uL (ref 0.7–4.0)
MCH: 30.2 pg (ref 26.0–34.0)
MCHC: 32.6 g/dL (ref 30.0–36.0)
MCV: 92.5 fL (ref 80.0–100.0)
Monocytes Absolute: 0.2 10*3/uL (ref 0.1–1.0)
Monocytes Relative: 3 %
Neutro Abs: 5.1 10*3/uL (ref 1.7–7.7)
Neutrophils Relative %: 76 %
Platelets: 327 10*3/uL (ref 150–400)
RBC: 4.01 MIL/uL (ref 3.87–5.11)
RDW: 15.6 % — ABNORMAL HIGH (ref 11.5–15.5)
WBC: 6.7 10*3/uL (ref 4.0–10.5)
nRBC: 0 % (ref 0.0–0.2)

## 2023-02-22 LAB — MAGNESIUM: Magnesium: 1.9 mg/dL (ref 1.7–2.4)

## 2023-02-22 MED ORDER — SODIUM CHLORIDE 0.9 % IV SOLN
10.0000 mg | Freq: Once | INTRAVENOUS | Status: AC
  Administered 2023-02-22: 10 mg via INTRAVENOUS
  Filled 2023-02-22: qty 10

## 2023-02-22 MED ORDER — CETIRIZINE HCL 10 MG/ML IV SOLN
10.0000 mg | Freq: Once | INTRAVENOUS | Status: AC
  Administered 2023-02-22: 10 mg via INTRAVENOUS
  Filled 2023-02-22: qty 1

## 2023-02-22 MED ORDER — SODIUM CHLORIDE 0.9% FLUSH
10.0000 mL | INTRAVENOUS | Status: DC | PRN
  Administered 2023-02-22: 10 mL via INTRAVENOUS

## 2023-02-22 MED ORDER — FAMOTIDINE IN NACL 20-0.9 MG/50ML-% IV SOLN
20.0000 mg | Freq: Once | INTRAVENOUS | Status: AC
  Administered 2023-02-22: 20 mg via INTRAVENOUS
  Filled 2023-02-22: qty 50

## 2023-02-22 MED ORDER — SODIUM CHLORIDE 0.9 % IV SOLN: Freq: Once | INTRAVENOUS | Status: AC

## 2023-02-22 MED ORDER — SODIUM CHLORIDE 0.9% FLUSH
10.0000 mL | INTRAVENOUS | Status: DC | PRN
  Administered 2023-02-22: 10 mL

## 2023-02-22 MED ORDER — SODIUM CHLORIDE 0.9 % IV SOLN
64.0000 mg/m2 | Freq: Once | INTRAVENOUS | Status: AC
  Administered 2023-02-22: 156 mg via INTRAVENOUS
  Filled 2023-02-22: qty 26

## 2023-02-22 MED ORDER — HEPARIN SOD (PORK) LOCK FLUSH 100 UNIT/ML IV SOLN
500.0000 [IU] | Freq: Once | INTRAVENOUS | Status: AC | PRN
  Administered 2023-02-22: 500 [IU]

## 2023-02-22 MED ORDER — ONDANSETRON HCL 4 MG/2ML IJ SOLN
8.0000 mg | Freq: Once | INTRAMUSCULAR | Status: AC
  Administered 2023-02-22: 8 mg via INTRAVENOUS
  Filled 2023-02-22: qty 4

## 2023-02-22 MED ORDER — POTASSIUM CHLORIDE CRYS ER 20 MEQ PO TBCR
40.0000 meq | EXTENDED_RELEASE_TABLET | Freq: Once | ORAL | Status: AC
  Administered 2023-02-22: 40 meq via ORAL
  Filled 2023-02-22: qty 2

## 2023-02-22 NOTE — Progress Notes (Signed)
Patient presents today for chemotherapy infusion. Patient is in satisfactory condition with no new complaints voiced.  Vital signs are stable.  Labs reviewed by Dr. Ellin Saba during the office visit.  ALT today is 110.  MD aware.  Potassium today is 3.3.  We will give Klor Con 40 mEq PO x one dose today per standing orders by Dr. Ellin Saba.   We will proceed with treatment per MD orders.   Patient tolerated treatment well with no complaints voiced.  Patient left ambulatory with husband in stable condition.  Vital signs stable at discharge.  Follow up as scheduled.

## 2023-02-22 NOTE — Progress Notes (Signed)
Patient has been examined by Dr. Ellin Saba. Vital signs and labs have been reviewed by MD - ANC, Creatinine, LFTs (ALT 110), hemoglobin, and platelets are within treatment parameters per M.D. - pt may proceed with treatment.  Primary RN and pharmacy notified.

## 2023-02-22 NOTE — Patient Instructions (Signed)

## 2023-02-22 NOTE — Patient Instructions (Signed)
[facility]  Discharge Instructions: Thank you for choosing [facility] to provide your oncology and hematology care.  If you have a lab appointment with the Cancer Center - please note that after April 8th, 2024, all labs will be drawn in the cancer center.  You do not have to check in or register with the main entrance as you have in the past but will complete your check-in in the cancer center.  Wear comfortable clothing and clothing appropriate for easy access to any Portacath or PICC line.   We strive to give you quality time with your provider. You may need to reschedule your appointment if you arrive late (15 or more minutes).  Arriving late affects you and other patients whose appointments are after yours.  Also, if you miss three or more appointments without notifying the office, you may be dismissed from the clinic at the provider's discretion.      For prescription refill requests, have your pharmacy contact our office and allow 72 hours for refills to be completed.    Today you received the following chemotherapy and/or immunotherapy agents Taxol. Paclitaxel Injection What is this medication? PACLITAXEL (PAK li TAX el) treats some types of cancer. It works by slowing down the growth of cancer cells. This medicine may be used for other purposes; ask your health care provider or pharmacist if you have questions. COMMON BRAND NAME(S): Onxol, Taxol What should I tell my care team before I take this medication? They need to know if you have any of these conditions: Heart disease Liver disease Low white blood cell levels An unusual or allergic reaction to paclitaxel, other medications, foods, dyes, or preservatives If you or your partner are pregnant or trying to get pregnant Breast-feeding How should I use this medication? This medication is injected into a vein. It is given by your care team in a hospital or clinic setting. Talk to your care team  about the use of this medication in children. While it may be given to children for selected conditions, precautions do apply. Overdosage: If you think you have taken too much of this medicine contact a poison control center or emergency room at once. NOTE: This medicine is only for you. Do not share this medicine with others. What if I miss a dose? Keep appointments for follow-up doses. It is important not to miss your dose. Call your care team if you are unable to keep an appointment. What may interact with this medication? Do not take this medication with any of the following: Live virus vaccines Other medications may affect the way this medication works. Talk with your care team about all of the medications you take. They may suggest changes to your treatment plan to lower the risk of side effects and to make sure your medications work as intended. This list may not describe all possible interactions. Give your health care provider a list of all the medicines, herbs, non-prescription drugs, or dietary supplements you use. Also tell them if you smoke, drink alcohol, or use illegal drugs. Some items may interact with your medicine. What should I watch for while using this medication? Your condition will be monitored carefully while you are receiving this medication. You may need blood work while taking this medication. This medication may make you feel generally unwell. This is not uncommon as chemotherapy can affect healthy cells as well as cancer cells. Report any side effects. Continue your course of treatment even though you  feel ill unless your care team tells you to stop. This medication can cause serious allergic reactions. To reduce the risk, your care team may give you other medications to take before receiving this one. Be sure to follow the directions from your care team. This medication may increase your risk of getting an infection. Call your care team for advice if you get a fever,  chills, sore throat, or other symptoms of a cold or flu. Do not treat yourself. Try to avoid being around people who are sick. This medication may increase your risk to bruise or bleed. Call your care team if you notice any unusual bleeding. Be careful brushing or flossing your teeth or using a toothpick because you may get an infection or bleed more easily. If you have any dental work done, tell your dentist you are receiving this medication. Talk to your care team if you may be pregnant. Serious birth defects can occur if you take this medication during pregnancy. Talk to your care team before breastfeeding. Changes to your treatment plan may be needed. What side effects may I notice from receiving this medication? Side effects that you should report to your care team as soon as possible: Allergic reactions--skin rash, itching, hives, swelling of the face, lips, tongue, or throat Heart rhythm changes--fast or irregular heartbeat, dizziness, feeling faint or lightheaded, chest pain, trouble breathing Increase in blood pressure Infection--fever, chills, cough, sore throat, wounds that don't heal, pain or trouble when passing urine, general feeling of discomfort or being unwell Low blood pressure--dizziness, feeling faint or lightheaded, blurry vision Low red blood cell level--unusual weakness or fatigue, dizziness, headache, trouble breathing Painful swelling, warmth, or redness of the skin, blisters or sores at the infusion site Pain, tingling, or numbness in the hands or feet Slow heartbeat--dizziness, feeling faint or lightheaded, confusion, trouble breathing, unusual weakness or fatigue Unusual bruising or bleeding Side effects that usually do not require medical attention (report to your care team if they continue or are bothersome): Diarrhea Hair loss Joint pain Loss of appetite Muscle pain Nausea Vomiting This list may not describe all possible side effects. Call your doctor for  medical advice about side effects. You may report side effects to FDA at 1-800-FDA-1088. Where should I keep my medication? This medication is given in a hospital or clinic. It will not be stored at home. NOTE: This sheet is a summary. It may not cover all possible information. If you have questions about this medicine, talk to your doctor, pharmacist, or health care provider.  2024 Elsevier/Gold Standard (2021-11-01 00:00:00)       To help prevent nausea and vomiting after your treatment, we encourage you to take your nausea medication as directed.  BELOW ARE SYMPTOMS THAT SHOULD BE REPORTED IMMEDIATELY: *FEVER GREATER THAN 100.4 F (38 C) OR HIGHER *CHILLS OR SWEATING *NAUSEA AND VOMITING THAT IS NOT CONTROLLED WITH YOUR NAUSEA MEDICATION *UNUSUAL SHORTNESS OF BREATH *UNUSUAL BRUISING OR BLEEDING *URINARY PROBLEMS (pain or burning when urinating, or frequent urination) *BOWEL PROBLEMS (unusual diarrhea, constipation, pain near the anus) TENDERNESS IN MOUTH AND THROAT WITH OR WITHOUT PRESENCE OF ULCERS (sore throat, sores in mouth, or a toothache) UNUSUAL RASH, SWELLING OR PAIN  UNUSUAL VAGINAL DISCHARGE OR ITCHING   Items with * indicate a potential emergency and should be followed up as soon as possible or go to the Emergency Department if any problems should occur.  Please show the CHEMOTHERAPY ALERT CARD or IMMUNOTHERAPY ALERT CARD at check-in to the Emergency  Department and triage nurse.  Should you have questions after your visit or need to cancel or reschedule your appointment, please contact [facility] [phone]  and follow the prompts.  Office hours are 8:00 a.m. to 4:30 p.m. Monday - Friday. Please note that voicemails left after 4:00 p.m. may not be returned until the following business day.  We are closed weekends and major holidays. You have access to a nurse at all times for urgent questions. Please call the main number to the clinic [phone] and  follow the prompts.  For any non-urgent questions, you may also contact your provider using MyChart. We now offer e-Visits for anyone 51 and older to request care online for non-urgent symptoms. For details visit [email or web address].   Also download the MyChart app! Go to the app store, search "MyChart", open the app, select [place], and log in with your MyChart username and password.

## 2023-02-22 NOTE — Progress Notes (Signed)
Ok to treat with HR 109  T.[address] Dr Carilyn Goodpasture, PharmD

## 2023-03-01 ENCOUNTER — Inpatient Hospital Stay: Payer: Managed Care, Other (non HMO)

## 2023-03-01 ENCOUNTER — Inpatient Hospital Stay: Payer: Managed Care, Other (non HMO) | Attending: Hematology

## 2023-03-01 ENCOUNTER — Inpatient Hospital Stay: Payer: Managed Care, Other (non HMO) | Admitting: Hematology

## 2023-03-01 VITALS — BP 113/78 | HR 78 | Temp 98.5°F | Resp 18

## 2023-03-01 DIAGNOSIS — Z5111 Encounter for antineoplastic chemotherapy: Secondary | ICD-10-CM | POA: Diagnosis present

## 2023-03-01 DIAGNOSIS — C50212 Malignant neoplasm of upper-inner quadrant of left female breast: Secondary | ICD-10-CM | POA: Diagnosis present

## 2023-03-01 DIAGNOSIS — Z79899 Other long term (current) drug therapy: Secondary | ICD-10-CM | POA: Insufficient documentation

## 2023-03-01 LAB — COMPREHENSIVE METABOLIC PANEL
ALT: 86 U/L — ABNORMAL HIGH (ref 0–44)
AST: 37 U/L (ref 15–41)
Albumin: 3.9 g/dL (ref 3.5–5.0)
Alkaline Phosphatase: 75 U/L (ref 38–126)
Anion gap: 12 (ref 5–15)
BUN: 14 mg/dL (ref 6–20)
CO2: 21 mmol/L — ABNORMAL LOW (ref 22–32)
Calcium: 8.9 mg/dL (ref 8.9–10.3)
Chloride: 105 mmol/L (ref 98–111)
Creatinine, Ser: 0.72 mg/dL (ref 0.44–1.00)
GFR, Estimated: >60 mL/min (ref >60)
Glucose, Bld: 102 mg/dL — ABNORMAL HIGH (ref 70–99)
Potassium: 3.9 mmol/L (ref 3.5–5.1)
Sodium: 138 mmol/L (ref 135–145)
Total Bilirubin: 0.7 mg/dL (ref 0.3–1.2)
Total Protein: 6.8 g/dL (ref 6.5–8.1)

## 2023-03-01 LAB — CBC WITH DIFFERENTIAL/PLATELET
Abs Immature Granulocytes: 0.12 10*3/uL — ABNORMAL HIGH (ref 0.00–0.07)
Basophils Absolute: 0.1 10*3/uL (ref 0.0–0.1)
Basophils Relative: 1 %
Eosinophils Absolute: 0.3 10*3/uL (ref 0.0–0.5)
Eosinophils Relative: 4 %
HCT: 39.7 % (ref 36.0–46.0)
Hemoglobin: 12.7 g/dL (ref 12.0–15.0)
Immature Granulocytes: 2 %
Lymphocytes Relative: 16 %
Lymphs Abs: 1 10*3/uL (ref 0.7–4.0)
MCH: 29.8 pg (ref 26.0–34.0)
MCHC: 32 g/dL (ref 30.0–36.0)
MCV: 93.2 fL (ref 80.0–100.0)
Monocytes Absolute: 0.3 10*3/uL (ref 0.1–1.0)
Monocytes Relative: 5 %
Neutro Abs: 4.6 10*3/uL (ref 1.7–7.7)
Neutrophils Relative %: 72 %
Platelets: 307 10*3/uL (ref 150–400)
RBC: 4.26 MIL/uL (ref 3.87–5.11)
RDW: 14.7 % (ref 11.5–15.5)
WBC: 6.4 10*3/uL (ref 4.0–10.5)
nRBC: 0 % (ref 0.0–0.2)

## 2023-03-01 LAB — MAGNESIUM: Magnesium: 2.1 mg/dL (ref 1.7–2.4)

## 2023-03-01 MED ORDER — FAMOTIDINE IN NACL 20-0.9 MG/50ML-% IV SOLN
20.0000 mg | Freq: Once | INTRAVENOUS | Status: AC
  Administered 2023-03-01: 20 mg via INTRAVENOUS
  Filled 2023-03-01: qty 50

## 2023-03-01 MED ORDER — SODIUM CHLORIDE 0.9 % IV SOLN
10.0000 mg | Freq: Once | INTRAVENOUS | Status: AC
  Administered 2023-03-01: 10 mg via INTRAVENOUS
  Filled 2023-03-01: qty 10

## 2023-03-01 MED ORDER — HEPARIN SOD (PORK) LOCK FLUSH 100 UNIT/ML IV SOLN
500.0000 [IU] | Freq: Once | INTRAVENOUS | Status: AC | PRN
  Administered 2023-03-01: 500 [IU]

## 2023-03-01 MED ORDER — ONDANSETRON HCL 4 MG/2ML IJ SOLN
8.0000 mg | Freq: Once | INTRAMUSCULAR | Status: AC
  Administered 2023-03-01: 8 mg via INTRAVENOUS
  Filled 2023-03-01: qty 4

## 2023-03-01 MED ORDER — SODIUM CHLORIDE 0.9 % IV SOLN: Freq: Once | INTRAVENOUS | Status: AC

## 2023-03-01 MED ORDER — SODIUM CHLORIDE 0.9% FLUSH
10.0000 mL | INTRAVENOUS | Status: DC | PRN
  Administered 2023-03-01: 10 mL

## 2023-03-01 MED ORDER — SODIUM CHLORIDE 0.9 % IV SOLN
64.0000 mg/m2 | Freq: Once | INTRAVENOUS | Status: AC
  Administered 2023-03-01: 156 mg via INTRAVENOUS
  Filled 2023-03-01: qty 26

## 2023-03-01 MED ORDER — CETIRIZINE HCL 10 MG/ML IV SOLN
10.0000 mg | Freq: Once | INTRAVENOUS | Status: AC
  Administered 2023-03-01: 10 mg via INTRAVENOUS
  Filled 2023-03-01: qty 1

## 2023-03-01 NOTE — Progress Notes (Signed)
Ok to proceed with today's labs. ? ?T.O. Dr Katragadda/Carol Jones, PharmD ?

## 2023-03-01 NOTE — Progress Notes (Signed)
Patient presents today for Taxol treatment. Vital signs within parameters for treatment. Labs pending.   ALT 86. Message sent to Dr. Ellin Saba.   Message received to proceed with treatment from Dr. Ellin Saba.   Taxol given today per MD orders. Tolerated infusion without adverse affects. Vital signs stable. No complaints at this time. Discharged from clinic ambulatory in stable condition. Alert and oriented x 3. F/U with [facility] as scheduled.

## 2023-03-01 NOTE — Patient Instructions (Signed)
[facility]  Discharge Instructions: Thank you for choosing [facility] to provide your oncology and hematology care.  If you have a lab appointment with the Cancer Center - please note that after April 8th, 2024, all labs will be drawn in the cancer center.  You do not have to check in or register with the main entrance as you have in the past but will complete your check-in in the cancer center.  Wear comfortable clothing and clothing appropriate for easy access to any Portacath or PICC line.   We strive to give you quality time with your provider. You may need to reschedule your appointment if you arrive late (15 or more minutes).  Arriving late affects you and other patients whose appointments are after yours.  Also, if you miss three or more appointments without notifying the office, you may be dismissed from the clinic at the provider's discretion.      For prescription refill requests, have your pharmacy contact our office and allow 72 hours for refills to be completed.    Today you received the following chemotherapy and/or immunotherapy agents Taxol. Paclitaxel Injection What is this medication? PACLITAXEL (PAK li TAX el) treats some types of cancer. It works by slowing down the growth of cancer cells. This medicine may be used for other purposes; ask your health care provider or pharmacist if you have questions. COMMON BRAND NAME(S): Onxol, Taxol What should I tell my care team before I take this medication? They need to know if you have any of these conditions: Heart disease Liver disease Low white blood cell levels An unusual or allergic reaction to paclitaxel, other medications, foods, dyes, or preservatives If you or your partner are pregnant or trying to get pregnant Breast-feeding How should I use this medication? This medication is injected into a vein. It is given by your care team in a hospital or clinic setting. Talk to your care team  about the use of this medication in children. While it may be given to children for selected conditions, precautions do apply. Overdosage: If you think you have taken too much of this medicine contact a poison control center or emergency room at once. NOTE: This medicine is only for you. Do not share this medicine with others. What if I miss a dose? Keep appointments for follow-up doses. It is important not to miss your dose. Call your care team if you are unable to keep an appointment. What may interact with this medication? Do not take this medication with any of the following: Live virus vaccines Other medications may affect the way this medication works. Talk with your care team about all of the medications you take. They may suggest changes to your treatment plan to lower the risk of side effects and to make sure your medications work as intended. This list may not describe all possible interactions. Give your health care provider a list of all the medicines, herbs, non-prescription drugs, or dietary supplements you use. Also tell them if you smoke, drink alcohol, or use illegal drugs. Some items may interact with your medicine. What should I watch for while using this medication? Your condition will be monitored carefully while you are receiving this medication. You may need blood work while taking this medication. This medication may make you feel generally unwell. This is not uncommon as chemotherapy can affect healthy cells as well as cancer cells. Report any side effects. Continue your course of treatment even though you  feel ill unless your care team tells you to stop. This medication can cause serious allergic reactions. To reduce the risk, your care team may give you other medications to take before receiving this one. Be sure to follow the directions from your care team. This medication may increase your risk of getting an infection. Call your care team for advice if you get a fever,  chills, sore throat, or other symptoms of a cold or flu. Do not treat yourself. Try to avoid being around people who are sick. This medication may increase your risk to bruise or bleed. Call your care team if you notice any unusual bleeding. Be careful brushing or flossing your teeth or using a toothpick because you may get an infection or bleed more easily. If you have any dental work done, tell your dentist you are receiving this medication. Talk to your care team if you may be pregnant. Serious birth defects can occur if you take this medication during pregnancy. Talk to your care team before breastfeeding. Changes to your treatment plan may be needed. What side effects may I notice from receiving this medication? Side effects that you should report to your care team as soon as possible: Allergic reactions--skin rash, itching, hives, swelling of the face, lips, tongue, or throat Heart rhythm changes--fast or irregular heartbeat, dizziness, feeling faint or lightheaded, chest pain, trouble breathing Increase in blood pressure Infection--fever, chills, cough, sore throat, wounds that don't heal, pain or trouble when passing urine, general feeling of discomfort or being unwell Low blood pressure--dizziness, feeling faint or lightheaded, blurry vision Low red blood cell level--unusual weakness or fatigue, dizziness, headache, trouble breathing Painful swelling, warmth, or redness of the skin, blisters or sores at the infusion site Pain, tingling, or numbness in the hands or feet Slow heartbeat--dizziness, feeling faint or lightheaded, confusion, trouble breathing, unusual weakness or fatigue Unusual bruising or bleeding Side effects that usually do not require medical attention (report to your care team if they continue or are bothersome): Diarrhea Hair loss Joint pain Loss of appetite Muscle pain Nausea Vomiting This list may not describe all possible side effects. Call your doctor for  medical advice about side effects. You may report side effects to FDA at 1-800-FDA-1088. Where should I keep my medication? This medication is given in a hospital or clinic. It will not be stored at home. NOTE: This sheet is a summary. It may not cover all possible information. If you have questions about this medicine, talk to your doctor, pharmacist, or health care provider.  2024 Elsevier/Gold Standard (2021-11-01 00:00:00)       To help prevent nausea and vomiting after your treatment, we encourage you to take your nausea medication as directed.  BELOW ARE SYMPTOMS THAT SHOULD BE REPORTED IMMEDIATELY: *FEVER GREATER THAN 100.4 F (38 C) OR HIGHER *CHILLS OR SWEATING *NAUSEA AND VOMITING THAT IS NOT CONTROLLED WITH YOUR NAUSEA MEDICATION *UNUSUAL SHORTNESS OF BREATH *UNUSUAL BRUISING OR BLEEDING *URINARY PROBLEMS (pain or burning when urinating, or frequent urination) *BOWEL PROBLEMS (unusual diarrhea, constipation, pain near the anus) TENDERNESS IN MOUTH AND THROAT WITH OR WITHOUT PRESENCE OF ULCERS (sore throat, sores in mouth, or a toothache) UNUSUAL RASH, SWELLING OR PAIN  UNUSUAL VAGINAL DISCHARGE OR ITCHING   Items with * indicate a potential emergency and should be followed up as soon as possible or go to the Emergency Department if any problems should occur.  Please show the CHEMOTHERAPY ALERT CARD or IMMUNOTHERAPY ALERT CARD at check-in to the Emergency  Department and triage nurse.  Should you have questions after your visit or need to cancel or reschedule your appointment, please contact [facility] [phone]  and follow the prompts.  Office hours are 8:00 a.m. to 4:30 p.m. Monday - Friday. Please note that voicemails left after 4:00 p.m. may not be returned until the following business day.  We are closed weekends and major holidays. You have access to a nurse at all times for urgent questions. Please call the main number to the clinic [phone] and  follow the prompts.  For any non-urgent questions, you may also contact your provider using MyChart. We now offer e-Visits for anyone 51 and older to request care online for non-urgent symptoms. For details visit [email or web address].   Also download the MyChart app! Go to the app store, search "MyChart", open the app, select [place], and log in with your MyChart username and password.

## 2023-03-02 ENCOUNTER — Other Ambulatory Visit: Payer: Self-pay

## 2023-03-02 DIAGNOSIS — Z170 Estrogen receptor positive status [ER+]: Secondary | ICD-10-CM

## 2023-03-02 LAB — LUTEINIZING HORMONE: LH: 29.8 m[IU]/mL

## 2023-03-02 LAB — ESTRADIOL: Estradiol: 12.6 pg/mL

## 2023-03-02 LAB — FOLLICLE STIMULATING HORMONE: FSH: 40.3 m[IU]/mL

## 2023-03-08 ENCOUNTER — Inpatient Hospital Stay: Payer: Managed Care, Other (non HMO)

## 2023-03-08 ENCOUNTER — Inpatient Hospital Stay: Payer: Managed Care, Other (non HMO) | Admitting: Hematology

## 2023-03-08 ENCOUNTER — Other Ambulatory Visit: Payer: Self-pay

## 2023-03-08 VITALS — BP 127/85 | HR 77 | Temp 97.6°F | Resp 20 | Wt 265.5 lb

## 2023-03-08 DIAGNOSIS — C50212 Malignant neoplasm of upper-inner quadrant of left female breast: Secondary | ICD-10-CM

## 2023-03-08 DIAGNOSIS — Z5111 Encounter for antineoplastic chemotherapy: Secondary | ICD-10-CM | POA: Diagnosis not present

## 2023-03-08 LAB — CBC WITH DIFFERENTIAL/PLATELET
Abs Immature Granulocytes: 0.09 10*3/uL — ABNORMAL HIGH (ref 0.00–0.07)
Basophils Absolute: 0.1 10*3/uL (ref 0.0–0.1)
Basophils Relative: 1 %
Eosinophils Absolute: 0.3 10*3/uL (ref 0.0–0.5)
Eosinophils Relative: 4 %
HCT: 36.9 % (ref 36.0–46.0)
Hemoglobin: 12 g/dL (ref 12.0–15.0)
Immature Granulocytes: 1 %
Lymphocytes Relative: 16 %
Lymphs Abs: 1.1 10*3/uL (ref 0.7–4.0)
MCH: 30.2 pg (ref 26.0–34.0)
MCHC: 32.5 g/dL (ref 30.0–36.0)
MCV: 92.7 fL (ref 80.0–100.0)
Monocytes Absolute: 0.3 10*3/uL (ref 0.1–1.0)
Monocytes Relative: 4 %
Neutro Abs: 5 10*3/uL (ref 1.7–7.7)
Neutrophils Relative %: 74 %
Platelets: 304 10*3/uL (ref 150–400)
RBC: 3.98 MIL/uL (ref 3.87–5.11)
RDW: 14.5 % (ref 11.5–15.5)
WBC: 6.8 10*3/uL (ref 4.0–10.5)
nRBC: 0 % (ref 0.0–0.2)

## 2023-03-08 LAB — COMPREHENSIVE METABOLIC PANEL
ALT: 85 U/L — ABNORMAL HIGH (ref 0–44)
AST: 42 U/L — ABNORMAL HIGH (ref 15–41)
Albumin: 3.7 g/dL (ref 3.5–5.0)
Alkaline Phosphatase: 75 U/L (ref 38–126)
Anion gap: 12 (ref 5–15)
BUN: 11 mg/dL (ref 6–20)
CO2: 26 mmol/L (ref 22–32)
Calcium: 8.8 mg/dL — ABNORMAL LOW (ref 8.9–10.3)
Chloride: 100 mmol/L (ref 98–111)
Creatinine, Ser: 0.79 mg/dL (ref 0.44–1.00)
GFR, Estimated: >60 mL/min (ref >60)
Glucose, Bld: 131 mg/dL — ABNORMAL HIGH (ref 70–99)
Potassium: 3.7 mmol/L (ref 3.5–5.1)
Sodium: 138 mmol/L (ref 135–145)
Total Bilirubin: 0.5 mg/dL (ref 0.3–1.2)
Total Protein: 6.7 g/dL (ref 6.5–8.1)

## 2023-03-08 LAB — MAGNESIUM: Magnesium: 2.1 mg/dL (ref 1.7–2.4)

## 2023-03-08 MED ORDER — SODIUM CHLORIDE 0.9% FLUSH
10.0000 mL | INTRAVENOUS | Status: DC | PRN
  Administered 2023-03-08: 10 mL

## 2023-03-08 MED ORDER — ONDANSETRON HCL 4 MG/2ML IJ SOLN
8.0000 mg | Freq: Once | INTRAMUSCULAR | Status: AC
  Administered 2023-03-08: 8 mg via INTRAVENOUS
  Filled 2023-03-08: qty 4

## 2023-03-08 MED ORDER — SODIUM CHLORIDE 0.9 % IV SOLN
64.0000 mg/m2 | Freq: Once | INTRAVENOUS | Status: AC
  Administered 2023-03-08: 156 mg via INTRAVENOUS
  Filled 2023-03-08: qty 26

## 2023-03-08 MED ORDER — SODIUM CHLORIDE 0.9% FLUSH
10.0000 mL | Freq: Once | INTRAVENOUS | Status: AC
  Administered 2023-03-08: 10 mL via INTRAVENOUS

## 2023-03-08 MED ORDER — SODIUM CHLORIDE 0.9 % IV SOLN
10.0000 mg | Freq: Once | INTRAVENOUS | Status: AC
  Administered 2023-03-08: 10 mg via INTRAVENOUS
  Filled 2023-03-08: qty 10

## 2023-03-08 MED ORDER — FAMOTIDINE IN NACL 20-0.9 MG/50ML-% IV SOLN
20.0000 mg | Freq: Once | INTRAVENOUS | Status: AC
  Administered 2023-03-08: 20 mg via INTRAVENOUS
  Filled 2023-03-08: qty 50

## 2023-03-08 MED ORDER — CETIRIZINE HCL 10 MG/ML IV SOLN
10.0000 mg | Freq: Once | INTRAVENOUS | Status: AC
  Administered 2023-03-08: 10 mg via INTRAVENOUS
  Filled 2023-03-08: qty 1

## 2023-03-08 MED ORDER — HEPARIN SOD (PORK) LOCK FLUSH 100 UNIT/ML IV SOLN
500.0000 [IU] | Freq: Once | INTRAVENOUS | Status: AC | PRN
  Administered 2023-03-08: 500 [IU]

## 2023-03-08 MED ORDER — SODIUM CHLORIDE 0.9 % IV SOLN: Freq: Once | INTRAVENOUS | Status: AC

## 2023-03-08 NOTE — Progress Notes (Signed)
Patient presents today for chemotherapy infusion.  Patient is in satisfactory condition with no new complaints voiced.  Vital signs are stable.  Labs reviewed.  ALT is 85.  MD made aware.  All other labs are within treatment parameters.   We will proceed with treatment per MD orders.  Patient tolerated treatment well with no complaints voiced.  Patient left ambulatory with husband in stable condition.  Vital signs stable at discharge.  Follow up as scheduled.

## 2023-03-08 NOTE — Patient Instructions (Signed)
[facility]  Discharge Instructions: Thank you for choosing [facility] to provide your oncology and hematology care.  If you have a lab appointment with the Cancer Center - please note that after April 8th, 2024, all labs will be drawn in the cancer center.  You do not have to check in or register with the main entrance as you have in the past but will complete your check-in in the cancer center.  Wear comfortable clothing and clothing appropriate for easy access to any Portacath or PICC line.   We strive to give you quality time with your provider. You may need to reschedule your appointment if you arrive late (15 or more minutes).  Arriving late affects you and other patients whose appointments are after yours.  Also, if you miss three or more appointments without notifying the office, you may be dismissed from the clinic at the provider's discretion.      For prescription refill requests, have your pharmacy contact our office and allow 72 hours for refills to be completed.    Today you received the following chemotherapy and/or immunotherapy agents Taxol. Paclitaxel Injection What is this medication? PACLITAXEL (PAK li TAX el) treats some types of cancer. It works by slowing down the growth of cancer cells. This medicine may be used for other purposes; ask your health care provider or pharmacist if you have questions. COMMON BRAND NAME(S): Onxol, Taxol What should I tell my care team before I take this medication? They need to know if you have any of these conditions: Heart disease Liver disease Low white blood cell levels An unusual or allergic reaction to paclitaxel, other medications, foods, dyes, or preservatives If you or your partner are pregnant or trying to get pregnant Breast-feeding How should I use this medication? This medication is injected into a vein. It is given by your care team in a hospital or clinic setting. Talk to your care team  about the use of this medication in children. While it may be given to children for selected conditions, precautions do apply. Overdosage: If you think you have taken too much of this medicine contact a poison control center or emergency room at once. NOTE: This medicine is only for you. Do not share this medicine with others. What if I miss a dose? Keep appointments for follow-up doses. It is important not to miss your dose. Call your care team if you are unable to keep an appointment. What may interact with this medication? Do not take this medication with any of the following: Live virus vaccines Other medications may affect the way this medication works. Talk with your care team about all of the medications you take. They may suggest changes to your treatment plan to lower the risk of side effects and to make sure your medications work as intended. This list may not describe all possible interactions. Give your health care provider a list of all the medicines, herbs, non-prescription drugs, or dietary supplements you use. Also tell them if you smoke, drink alcohol, or use illegal drugs. Some items may interact with your medicine. What should I watch for while using this medication? Your condition will be monitored carefully while you are receiving this medication. You may need blood work while taking this medication. This medication may make you feel generally unwell. This is not uncommon as chemotherapy can affect healthy cells as well as cancer cells. Report any side effects. Continue your course of treatment even though you  feel ill unless your care team tells you to stop. This medication can cause serious allergic reactions. To reduce the risk, your care team may give you other medications to take before receiving this one. Be sure to follow the directions from your care team. This medication may increase your risk of getting an infection. Call your care team for advice if you get a fever,  chills, sore throat, or other symptoms of a cold or flu. Do not treat yourself. Try to avoid being around people who are sick. This medication may increase your risk to bruise or bleed. Call your care team if you notice any unusual bleeding. Be careful brushing or flossing your teeth or using a toothpick because you may get an infection or bleed more easily. If you have any dental work done, tell your dentist you are receiving this medication. Talk to your care team if you may be pregnant. Serious birth defects can occur if you take this medication during pregnancy. Talk to your care team before breastfeeding. Changes to your treatment plan may be needed. What side effects may I notice from receiving this medication? Side effects that you should report to your care team as soon as possible: Allergic reactions--skin rash, itching, hives, swelling of the face, lips, tongue, or throat Heart rhythm changes--fast or irregular heartbeat, dizziness, feeling faint or lightheaded, chest pain, trouble breathing Increase in blood pressure Infection--fever, chills, cough, sore throat, wounds that don't heal, pain or trouble when passing urine, general feeling of discomfort or being unwell Low blood pressure--dizziness, feeling faint or lightheaded, blurry vision Low red blood cell level--unusual weakness or fatigue, dizziness, headache, trouble breathing Painful swelling, warmth, or redness of the skin, blisters or sores at the infusion site Pain, tingling, or numbness in the hands or feet Slow heartbeat--dizziness, feeling faint or lightheaded, confusion, trouble breathing, unusual weakness or fatigue Unusual bruising or bleeding Side effects that usually do not require medical attention (report to your care team if they continue or are bothersome): Diarrhea Hair loss Joint pain Loss of appetite Muscle pain Nausea Vomiting This list may not describe all possible side effects. Call your doctor for  medical advice about side effects. You may report side effects to FDA at 1-800-FDA-1088. Where should I keep my medication? This medication is given in a hospital or clinic. It will not be stored at home. NOTE: This sheet is a summary. It may not cover all possible information. If you have questions about this medicine, talk to your doctor, pharmacist, or health care provider.  2024 Elsevier/Gold Standard (2021-11-01 00:00:00)       To help prevent nausea and vomiting after your treatment, we encourage you to take your nausea medication as directed.  BELOW ARE SYMPTOMS THAT SHOULD BE REPORTED IMMEDIATELY: *FEVER GREATER THAN 100.4 F (38 C) OR HIGHER *CHILLS OR SWEATING *NAUSEA AND VOMITING THAT IS NOT CONTROLLED WITH YOUR NAUSEA MEDICATION *UNUSUAL SHORTNESS OF BREATH *UNUSUAL BRUISING OR BLEEDING *URINARY PROBLEMS (pain or burning when urinating, or frequent urination) *BOWEL PROBLEMS (unusual diarrhea, constipation, pain near the anus) TENDERNESS IN MOUTH AND THROAT WITH OR WITHOUT PRESENCE OF ULCERS (sore throat, sores in mouth, or a toothache) UNUSUAL RASH, SWELLING OR PAIN  UNUSUAL VAGINAL DISCHARGE OR ITCHING   Items with * indicate a potential emergency and should be followed up as soon as possible or go to the Emergency Department if any problems should occur.  Please show the CHEMOTHERAPY ALERT CARD or IMMUNOTHERAPY ALERT CARD at check-in to the Emergency  Department and triage nurse.  Should you have questions after your visit or need to cancel or reschedule your appointment, please contact [facility] [phone]  and follow the prompts.  Office hours are 8:00 a.m. to 4:30 p.m. Monday - Friday. Please note that voicemails left after 4:00 p.m. may not be returned until the following business day.  We are closed weekends and major holidays. You have access to a nurse at all times for urgent questions. Please call the main number to the clinic [phone] and  follow the prompts.  For any non-urgent questions, you may also contact your provider using MyChart. We now offer e-Visits for anyone 51 and older to request care online for non-urgent symptoms. For details visit [email or web address].   Also download the MyChart app! Go to the app store, search "MyChart", open the app, select [place], and log in with your MyChart username and password.

## 2023-03-08 NOTE — Progress Notes (Signed)
Referral order placed for plastic surgery and radiation oncology per patient request.

## 2023-03-12 NOTE — Progress Notes (Incomplete)
Radiation Oncology         [phone] ________________________________  Name: Diana Jensen        MRN: [id number]  Date of Service: 03/14/2023 DOB: 1976-02-17  [id number], Lucina Mellow, MD  [name], MD     REFERRING PHYSICIAN: [name], MD   DIAGNOSIS: The encounter diagnosis was Malignant neoplasm of upper-inner quadrant of left breast in female, estrogen receptor positive (HCC).   HISTORY OF PRESENT ILLNESS: Diana Jensen is a 47 y.o. female seen at the request of Dr. Ellin Saba for diagnosis of left breast cancer. The patient was originally diagnosed in February 2024 presenting with palpable mass in the left breast further diagnostic workup showed calcifications measuring 3.7 cm in the anterior left breast.  By ultrasound in the 9 o'clock position there was a 1.9 cm mass in the axilla was negative for adenopathy.  She underwent biopsies on 08/10/22 that showed grade 2 invasive ductal carcinoma in the subareolar mass at 9:00 with stated intermediate to high-grade DCIS her tumor was ER/PR positive HER2 negative with a Ki-67 of 10% and additional biopsies on 08/18/2022 of the calcifications showed intermediate grade DCIS in both.  The second specimen had microcalcifications and was tested for prognostics which showed ER/PR positivity.  The patient elected for a left mastectomy with sentinel lymph node biopsy which was performed on 08/28/2022 and showed a grade 2 invasive ductal carcinoma measuring 1.6 cm with multifocal intermediate grade DCIS.  Her invasive and in situ margins were negative, 1 sentinel lymph nodes submitted was consistent with metastatic disease and had focal extracapsular extension she developed postoperative changes that were concerning along her surgical site and underwent reexcision of her scar on 10/05/2022 showing dermal scar with acute and chronic inflammation and necrosis negative for carcinoma.  She had a PET scan on 10/19/2022 that showed no metastatic  disease and she was counseled on the rationale for adjuvant chemotherapy which she completed between 10/25/2022 and 03/13/2023.  She is seen to discuss adjuvant radiotherapy to the left breast and regional lymph nodes.   PREVIOUS RADIATION THERAPY: {EXAM; YES/NO:19492::"No"}   PAST MEDICAL HISTORY:  Past Medical History:  Diagnosis Date   Allergy    seasonal   Antiphospholipid syndrome (HCC)    Anxiety    Arthritis    deterioration of spine, knees   Carpal tunnel syndrome of right wrist    Clotting disorder (HCC)    antiphospholipid syndrome   Depression    Encounter for general adult medical examination with abnormal findings 07/03/2022   Essential hypertension, benign 02/10/2020   Hyperlipidemia 08/02/2022   Sleep disorder 02/22/2016       PAST SURGICAL HISTORY: Past Surgical History:  Procedure Laterality Date   BREAST BIOPSY Left 08/10/2022   [place] LT BREAST BX W LOC DEV 1ST LESION IMG BX SPEC US GUIDE 08/10/2022 [facility]   BREAST BIOPSY Left 08/18/2022   MM LT BREAST BX W LOC DEV EA AD LESION IMG BX SPEC STEREO GUIDE 08/18/2022 [facility]   BREAST BIOPSY Left 08/18/2022   MM LT BREAST BX W LOC DEV 1ST LESION IMAGE BX SPEC STEREO GUIDE 08/18/2022 [facility]   CARPAL TUNNEL RELEASE Right 01/09/2019   Procedure: RIGHT CARPAL TUNNEL RELEASE;  Surgeon: Vickki Hearing, MD;  Location: [facility];  Service: Orthopedics;  Laterality: Right;   DILATION AND CURETTAGE OF UTERUS     x2   EYE SURGERY     lazy eye   IRRIGATION AND DEBRIDEMENT HEMATOMA Left 10/05/2022  Procedure: HEMATOMA EVACUATION OF LEFT BREAST WITH JP DRAIN PLACEMENT;  Surgeon: [name], MD;  Location: [facility];  Service: General;  Laterality: Left;   PARTIAL HYSTERECTOMY  2011   PORTACATH PLACEMENT Right 10/05/2022   Procedure: INSERTION PORT-A-CATH;  Surgeon: [name], MD;  Location: [facility];  Service: General;  Laterality: Right;   SIMPLE MASTECTOMY WITH AXILLARY SENTINEL NODE  BIOPSY Left 08/28/2022   Procedure: SIMPLE MASTECTOMY WITH AXILLARY SENTINEL NODE BIOPSY;  Surgeon: [name], MD;  Location: [facility];  Service: General;  Laterality: Left;     FAMILY HISTORY:  Family History  Problem Relation Age of Onset   Breast cancer Mother 47   Arthritis Mother    Hypertension Mother    Osteoporosis Mother    Thyroid disease Father    Hypertension Father    Skin cancer Father    Mental illness Sister        depression ? bipolar   Dementia Maternal Grandmother    Prostate cancer Maternal Grandfather        prostate   Heart disease Paternal Grandmother    Colon cancer Paternal Grandfather    Mental illness Daughter    Pancreatic cancer Cousin        maternal cousin   Cancer Cousin        paternal, unknown type   Cancer Cousin        paternal, unknown type   Cancer - Lung Neg Hx    Cervical cancer Neg Hx      SOCIAL HISTORY:  reports that she has been smoking cigarettes. She has a 14 pack-year smoking history. She has never used smokeless tobacco. She reports that she does not drink alcohol and does not use drugs.  Is married and lives in [place], [place].  She***   ALLERGIES: Patient has no known allergies.   MEDICATIONS:  Current Outpatient Medications  Medication Sig Dispense Refill   acetaminophen (TYLENOL) 500 MG tablet Take 1,000 mg by mouth every 6 (six) hours as needed for headache.     Cholecalciferol (VITAMIN D-3) 25 MCG (1000 UT) CAPS Take 1 capsule by mouth daily.     escitalopram (LEXAPRO) 10 MG tablet Take 1 tablet (10 mg total) by mouth daily. 90 tablet 1   hydrOXYzine (VISTARIL) 50 MG capsule Take 1 capsule (50 mg total) by mouth daily as needed for anxiety. 30 capsule 2   lidocaine-prilocaine (EMLA) cream Apply to affected area once 30 g 3   loratadine (CLARITIN) 10 MG tablet Take 10 mg by mouth daily.     losartan (COZAAR) 25 MG tablet Take 1 tablet (25 mg total) by mouth daily. 90 tablet 1   ondansetron  (ZOFRAN-ODT) 4 MG disintegrating tablet Take 1 tablet (4 mg total) by mouth every 6 (six) hours as needed for nausea. 20 tablet 0   oxyCODONE (OXY IR/ROXICODONE) 5 MG immediate release tablet Take 1-2 tablets (5-10 mg total) by mouth every 4 (four) hours as needed for severe pain or breakthrough pain. 15 tablet 0   prochlorperazine (COMPAZINE) 10 MG tablet TAKE ONE TABLET BY MOUTH EVERY 6 HOURS AS NEEDED FOR NAUSEA AND VOMITING 60 tablet 3   No current facility-administered medications for this visit.     REVIEW OF SYSTEMS: On review of systems, the patient reports that she is doing ***     PHYSICAL EXAM:  Wt Readings from Last 3 Encounters:  03/08/23 265 lb 8 oz (120.4 kg)  03/01/23 262 lb  12.8 oz (119.2 kg)  02/22/23 265 lb 6.4 oz (120.4 kg)   Temp Readings from Last 3 Encounters:  03/08/23 97.6 F (36.4 C) (Tympanic)  03/01/23 98.5 F (36.9 C) (Tympanic)  03/01/23 (!) 97.5 F (36.4 C) (Tympanic)   BP Readings from Last 3 Encounters:  03/08/23 127/85  03/01/23 113/78  03/01/23 (!) 133/92   Pulse Readings from Last 3 Encounters:  03/08/23 77  03/01/23 78  03/01/23 85    In general this is a well appearing *** female in no acute distress. She's alert and oriented x4 and appropriate throughout the examination. Cardiopulmonary assessment is negative for acute distress and she exhibits normal effort. Bilateral breast exam is deferred.    ECOG = ***  0 - Asymptomatic (Fully active, able to carry on all predisease activities without restriction)  1 - Symptomatic but completely ambulatory (Restricted in physically strenuous activity but ambulatory and able to carry out work of a light or sedentary nature. For example, light housework, office work)  2 - Symptomatic, <50% in bed during the day (Ambulatory and capable of all self care but unable to carry out any work activities. Up and about more than 50% of waking hours)  3 - Symptomatic, >50% in bed, but not bedbound  (Capable of only limited self-care, confined to bed or chair 50% or more of waking hours)  4 - Bedbound (Completely disabled. Cannot carry on any self-care. Totally confined to bed or chair)  5 - Death   Santiago Glad MM, Creech RH, Tormey DC, et al. [phone]). "Toxicity and response criteria of the [facility]". Am. Evlyn Clines. Oncol. 5 (6): 649-55    LABORATORY DATA:  Lab Results  Component Value Date   WBC 6.8 03/08/2023   HGB 12.0 03/08/2023   HCT 36.9 03/08/2023   MCV 92.7 03/08/2023   PLT 304 03/08/2023   Lab Results  Component Value Date   NA 138 03/08/2023   K 3.7 03/08/2023   CL 100 03/08/2023   CO2 26 03/08/2023   Lab Results  Component Value Date   ALT 85 (H) 03/08/2023   AST 42 (H) 03/08/2023   ALKPHOS 75 03/08/2023   BILITOT 0.5 03/08/2023      RADIOGRAPHY: No results found.     IMPRESSION/PLAN: 1. 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 Dr. Mitzi Hansen discusses the pathology findings and reviews the nature of node positive breast disease.  She has done well since surgery and has now completed systemic chemotherapy.  Dr. Mitzi Hansen recommends external radiotherapy to the breast and regional lymph nodes to reduce risks of local recurrence. She will also begin antiestrogen therapy thereafter.  We discussed the risks, benefits, short, and long term effects of radiotherapy, as well as the curative intent, and the patient is interested in proceeding. Dr. Mitzi Hansen discusses the delivery and logistics of radiotherapy and anticipates a course of 6 1/2 weeks of radiotherapy to the left breast and regional lymph nodes with deep inspiration breath-hold technique. Written consent is obtained and placed in the chart, a copy was provided to the patient. The patient will be contacted to coordinate treatment planning by our simulation department.  2. Contraceptive counseling.  The patient is counseled on the need to avoid pregnancy  during radiotherapy.***      In a visit lasting *** minutes, greater than 50% of the time was spent face to face reviewing her case, as well as in preparation of, discussing, and coordinating  the patient's care.  The above documentation reflects my direct findings during this shared patient visit. Please see the separate note by Dr. Mitzi Hansen on this date for the remainder of the patient's plan of care.    Osker Mason, [facility]    **Disclaimer: This note was dictated with voice recognition software. Similar sounding words can inadvertently be transcribed and this note may contain transcription errors which may not have been corrected upon publication of note.**

## 2023-03-12 NOTE — Progress Notes (Signed)
New Breast Cancer Diagnosis: Left Breast  Patient was unable to come into the clinic for her appointment and opted for telephone consultation.    Did patient present with symptoms (if so, please note symptoms) or screening mammography?:Palpable mass    PET 10/19/2022: no metastatic disease.   Location and Extent of disease :left breast. Located at 9 o'clock position, measured 1.9 cm in greatest dimension. Adenopathy no.  Histology per Pathology Report: grade 2, Invasive Ductal Carcinoma 08/28/2022  Receptor Status: ER(positive), PR (positive), Her2-neu (negative), Ki-(10%)   Surgeon and surgical plan, if any:  Dr. Henreitta Leber -Simple mastectomy with axillary SLN biopsy 08/28/2022   Medical oncologist, treatment if any:   Dr. Ellin Saba -Adjuvant chemotherapy 5/1-9/17/2024 - Proceed with week #9 paclitaxel with 20% dose reduction.  She will complete 12 weeks on 03/13/2023. - We talked about referring her to radiation oncology. - We will also start her on antiestrogen therapy after completion of chemotherapy.  She had hysterectomy many years ago.  We will obtain  FSH, LH and estradiol levels.  RTC 6 weeks for follow-up -Chemo completion 03/15/2023.   Family History of Breast/Ovarian/Prostate Cancer: Mom had breast cancer, maternal grandfather had prostate cancer.  Lymphedema issues, if any: No     Pain issues, if any: No    SAFETY ISSUES: Prior radiation? No Pacemaker/ICD? No Possible current pregnancy? Hysterectomy Is the patient on methotrexate? No  Current Complaints / other details:   -Port Insertion 10/05/2022

## 2023-03-14 ENCOUNTER — Ambulatory Visit
Admission: RE | Admit: 2023-03-14 | Discharge: 2023-03-14 | Disposition: A | Discharge status: Home / Self Care | Payer: Managed Care, Other (non HMO) | Source: Ambulatory Visit | Attending: Radiation Oncology | Admitting: Radiation Oncology

## 2023-03-14 ENCOUNTER — Encounter: Payer: Self-pay | Admitting: Radiation Oncology

## 2023-03-14 DIAGNOSIS — C50212 Malignant neoplasm of upper-inner quadrant of left female breast: Secondary | ICD-10-CM

## 2023-03-14 DIAGNOSIS — Z170 Estrogen receptor positive status [ER+]: Secondary | ICD-10-CM

## 2023-03-14 NOTE — Addendum Note (Signed)
Encounter addended by: Ronny Bacon, PA-C on: 03/14/2023 11:49 AM  Actions taken: Clinical Note Signed

## 2023-03-15 ENCOUNTER — Inpatient Hospital Stay: Payer: Managed Care, Other (non HMO)

## 2023-03-15 VITALS — BP 108/49 | HR 83 | Temp 97.5°F | Resp 18

## 2023-03-15 DIAGNOSIS — Z5111 Encounter for antineoplastic chemotherapy: Secondary | ICD-10-CM | POA: Diagnosis not present

## 2023-03-15 DIAGNOSIS — Z170 Estrogen receptor positive status [ER+]: Secondary | ICD-10-CM

## 2023-03-15 DIAGNOSIS — C50212 Malignant neoplasm of upper-inner quadrant of left female breast: Secondary | ICD-10-CM

## 2023-03-15 LAB — CBC WITH DIFFERENTIAL/PLATELET
Abs Immature Granulocytes: 0.11 10*3/uL — ABNORMAL HIGH (ref 0.00–0.07)
Basophils Absolute: 0.1 10*3/uL (ref 0.0–0.1)
Basophils Relative: 1 %
Eosinophils Absolute: 0.2 10*3/uL (ref 0.0–0.5)
Eosinophils Relative: 4 %
HCT: 38.9 % (ref 36.0–46.0)
Hemoglobin: 12.8 g/dL (ref 12.0–15.0)
Immature Granulocytes: 2 %
Lymphocytes Relative: 19 %
Lymphs Abs: 1.1 10*3/uL (ref 0.7–4.0)
MCH: 30 pg (ref 26.0–34.0)
MCHC: 32.9 g/dL (ref 30.0–36.0)
MCV: 91.3 fL (ref 80.0–100.0)
Monocytes Absolute: 0.3 10*3/uL (ref 0.1–1.0)
Monocytes Relative: 6 %
Neutro Abs: 4 10*3/uL (ref 1.7–7.7)
Neutrophils Relative %: 68 %
Platelets: 335 10*3/uL (ref 150–400)
RBC: 4.26 MIL/uL (ref 3.87–5.11)
RDW: 14.4 % (ref 11.5–15.5)
WBC: 5.8 10*3/uL (ref 4.0–10.5)
nRBC: 0 % (ref 0.0–0.2)

## 2023-03-15 LAB — COMPREHENSIVE METABOLIC PANEL
ALT: 107 U/L — ABNORMAL HIGH (ref 0–44)
AST: 58 U/L — ABNORMAL HIGH (ref 15–41)
Albumin: 4 g/dL (ref 3.5–5.0)
Alkaline Phosphatase: 69 U/L (ref 38–126)
Anion gap: 12 (ref 5–15)
BUN: 10 mg/dL (ref 6–20)
CO2: 23 mmol/L (ref 22–32)
Calcium: 9.2 mg/dL (ref 8.9–10.3)
Chloride: 106 mmol/L (ref 98–111)
Creatinine, Ser: 0.62 mg/dL (ref 0.44–1.00)
GFR, Estimated: >60 mL/min (ref >60)
Glucose, Bld: 105 mg/dL — ABNORMAL HIGH (ref 70–99)
Potassium: 3.8 mmol/L (ref 3.5–5.1)
Sodium: 141 mmol/L (ref 135–145)
Total Bilirubin: 0.8 mg/dL (ref 0.3–1.2)
Total Protein: 6.8 g/dL (ref 6.5–8.1)

## 2023-03-15 LAB — MAGNESIUM: Magnesium: 2.2 mg/dL (ref 1.7–2.4)

## 2023-03-15 MED ORDER — SODIUM CHLORIDE 0.9 % IV SOLN
64.0000 mg/m2 | Freq: Once | INTRAVENOUS | Status: AC
  Administered 2023-03-15: 156 mg via INTRAVENOUS
  Filled 2023-03-15: qty 26

## 2023-03-15 MED ORDER — HEPARIN SOD (PORK) LOCK FLUSH 100 UNIT/ML IV SOLN
500.0000 [IU] | Freq: Once | INTRAVENOUS | Status: AC | PRN
  Administered 2023-03-15: 500 [IU]

## 2023-03-15 MED ORDER — SODIUM CHLORIDE 0.9 % IV SOLN
10.0000 mg | Freq: Once | INTRAVENOUS | Status: AC
  Administered 2023-03-15: 10 mg via INTRAVENOUS
  Filled 2023-03-15: qty 10

## 2023-03-15 MED ORDER — SODIUM CHLORIDE 0.9% FLUSH
10.0000 mL | Freq: Once | INTRAVENOUS | Status: AC
  Administered 2023-03-15: 10 mL via INTRAVENOUS

## 2023-03-15 MED ORDER — CETIRIZINE HCL 10 MG/ML IV SOLN
10.0000 mg | Freq: Once | INTRAVENOUS | Status: AC
  Administered 2023-03-15: 10 mg via INTRAVENOUS
  Filled 2023-03-15: qty 1

## 2023-03-15 MED ORDER — ONDANSETRON HCL 4 MG/2ML IJ SOLN
8.0000 mg | Freq: Once | INTRAMUSCULAR | Status: AC
  Administered 2023-03-15: 8 mg via INTRAVENOUS
  Filled 2023-03-15: qty 4

## 2023-03-15 MED ORDER — SODIUM CHLORIDE 0.9% FLUSH
10.0000 mL | INTRAVENOUS | Status: DC | PRN
  Administered 2023-03-15: 10 mL

## 2023-03-15 MED ORDER — FAMOTIDINE IN NACL 20-0.9 MG/50ML-% IV SOLN
20.0000 mg | Freq: Once | INTRAVENOUS | Status: AC
  Administered 2023-03-15: 20 mg via INTRAVENOUS
  Filled 2023-03-15: qty 50

## 2023-03-15 MED ORDER — SODIUM CHLORIDE 0.9 % IV SOLN: Freq: Once | INTRAVENOUS | Status: AC

## 2023-03-15 NOTE — Patient Instructions (Signed)
[facility]  Discharge Instructions: Thank you for choosing [facility] to provide your oncology and hematology care.  If you have a lab appointment with the Cancer Center - please note that after April 8th, 2024, all labs will be drawn in the cancer center.  You do not have to check in or register with the main entrance as you have in the past but will complete your check-in in the cancer center.  Wear comfortable clothing and clothing appropriate for easy access to any Portacath or PICC line.   We strive to give you quality time with your provider. You may need to reschedule your appointment if you arrive late (15 or more minutes).  Arriving late affects you and other patients whose appointments are after yours.  Also, if you miss three or more appointments without notifying the office, you may be dismissed from the clinic at the provider's discretion.      For prescription refill requests, have your pharmacy contact our office and allow 72 hours for refills to be completed.    Today you received the following chemotherapy and/or immunotherapy agents Taxol.  Paclitaxel Injection What is this medication? PACLITAXEL (PAK li TAX el) treats some types of cancer. It works by slowing down the growth of cancer cells. This medicine may be used for other purposes; ask your health care provider or pharmacist if you have questions. COMMON BRAND NAME(S): Onxol, Taxol What should I tell my care team before I take this medication? They need to know if you have any of these conditions: Heart disease Liver disease Low white blood cell levels An unusual or allergic reaction to paclitaxel, other medications, foods, dyes, or preservatives If you or your partner are pregnant or trying to get pregnant Breast-feeding How should I use this medication? This medication is injected into a vein. It is given by your care team in a hospital or clinic setting. Talk to your care  team about the use of this medication in children. While it may be given to children for selected conditions, precautions do apply. Overdosage: If you think you have taken too much of this medicine contact a poison control center or emergency room at once. NOTE: This medicine is only for you. Do not share this medicine with others. What if I miss a dose? Keep appointments for follow-up doses. It is important not to miss your dose. Call your care team if you are unable to keep an appointment. What may interact with this medication? Do not take this medication with any of the following: Live virus vaccines Other medications may affect the way this medication works. Talk with your care team about all of the medications you take. They may suggest changes to your treatment plan to lower the risk of side effects and to make sure your medications work as intended. This list may not describe all possible interactions. Give your health care provider a list of all the medicines, herbs, non-prescription drugs, or dietary supplements you use. Also tell them if you smoke, drink alcohol, or use illegal drugs. Some items may interact with your medicine. What should I watch for while using this medication? Your condition will be monitored carefully while you are receiving this medication. You may need blood work while taking this medication. This medication may make you feel generally unwell. This is not uncommon as chemotherapy can affect healthy cells as well as cancer cells. Report any side effects. Continue your course of treatment even though  you feel ill unless your care team tells you to stop. This medication can cause serious allergic reactions. To reduce the risk, your care team may give you other medications to take before receiving this one. Be sure to follow the directions from your care team. This medication may increase your risk of getting an infection. Call your care team for advice if you get a fever,  chills, sore throat, or other symptoms of a cold or flu. Do not treat yourself. Try to avoid being around people who are sick. This medication may increase your risk to bruise or bleed. Call your care team if you notice any unusual bleeding. Be careful brushing or flossing your teeth or using a toothpick because you may get an infection or bleed more easily. If you have any dental work done, tell your dentist you are receiving this medication. Talk to your care team if you may be pregnant. Serious birth defects can occur if you take this medication during pregnancy. Talk to your care team before breastfeeding. Changes to your treatment plan may be needed. What side effects may I notice from receiving this medication? Side effects that you should report to your care team as soon as possible: Allergic reactions--skin rash, itching, hives, swelling of the face, lips, tongue, or throat Heart rhythm changes--fast or irregular heartbeat, dizziness, feeling faint or lightheaded, chest pain, trouble breathing Increase in blood pressure Infection--fever, chills, cough, sore throat, wounds that don't heal, pain or trouble when passing urine, general feeling of discomfort or being unwell Low blood pressure--dizziness, feeling faint or lightheaded, blurry vision Low red blood cell level--unusual weakness or fatigue, dizziness, headache, trouble breathing Painful swelling, warmth, or redness of the skin, blisters or sores at the infusion site Pain, tingling, or numbness in the hands or feet Slow heartbeat--dizziness, feeling faint or lightheaded, confusion, trouble breathing, unusual weakness or fatigue Unusual bruising or bleeding Side effects that usually do not require medical attention (report to your care team if they continue or are bothersome): Diarrhea Hair loss Joint pain Loss of appetite Muscle pain Nausea Vomiting This list may not describe all possible side effects. Call your doctor for  medical advice about side effects. You may report side effects to FDA at 1-800-FDA-1088. Where should I keep my medication? This medication is given in a hospital or clinic. It will not be stored at home. NOTE: This sheet is a summary. It may not cover all possible information. If you have questions about this medicine, talk to your doctor, pharmacist, or health care provider.  2024 Elsevier/Gold Standard (2021-11-01 00:00:00)        To help prevent nausea and vomiting after your treatment, we encourage you to take your nausea medication as directed.  BELOW ARE SYMPTOMS THAT SHOULD BE REPORTED IMMEDIATELY: *FEVER GREATER THAN 100.4 F (38 C) OR HIGHER *CHILLS OR SWEATING *NAUSEA AND VOMITING THAT IS NOT CONTROLLED WITH YOUR NAUSEA MEDICATION *UNUSUAL SHORTNESS OF BREATH *UNUSUAL BRUISING OR BLEEDING *URINARY PROBLEMS (pain or burning when urinating, or frequent urination) *BOWEL PROBLEMS (unusual diarrhea, constipation, pain near the anus) TENDERNESS IN MOUTH AND THROAT WITH OR WITHOUT PRESENCE OF ULCERS (sore throat, sores in mouth, or a toothache) UNUSUAL RASH, SWELLING OR PAIN  UNUSUAL VAGINAL DISCHARGE OR ITCHING   Items with * indicate a potential emergency and should be followed up as soon as possible or go to the Emergency Department if any problems should occur.  Please show the CHEMOTHERAPY ALERT CARD or IMMUNOTHERAPY ALERT CARD at check-in to  the Emergency Department and triage nurse.  Should you have questions after your visit or need to cancel or reschedule your appointment, please contact [facility] AT [facility] [phone]  and follow the prompts.  Office hours are 8:00 a.m. to 4:30 p.m. Monday - Friday. Please note that voicemails left after 4:00 p.m. may not be returned until the following business day.  We are closed weekends and major holidays. You have access to a nurse at all times for urgent questions. Please call the main number to the clinic [phone]  and follow the prompts.  For any non-urgent questions, you may also contact your provider using MyChart. We now offer e-Visits for anyone 30 and older to request care online for non-urgent symptoms. For details visit [email or web address].   Also download the MyChart app! Go to the app store, search "MyChart", open the app, select [place], and log in with your MyChart username and password.

## 2023-03-15 NOTE — Progress Notes (Signed)
Patient presents today for chemotherapy infusion.  Patient is in satisfactory condition with no new complaints voiced.  Vital signs are stable.  Labs reviewed.  ALT today is 107.  Dr. Anders Simmonds made aware.  We will proceed with treatment per MD orders.   Patient tolerated treatment well with no complaints voiced.  Patient left ambulatory with husband in stable condition.  Vital signs stable at discharge.  Follow up as scheduled.

## 2023-03-16 NOTE — Therapy (Unsigned)
OUTPATIENT PHYSICAL THERAPY LYMPHEDEMA EVALUATION  Patient Name: Diana Jensen MRN: [id number] DOB:1975/09/05, 47 y.o., female Today's Date: 03/19/2023  END OF SESSION:  PT End of Session - 03/19/23 1634     Visit Number 1    Number of Visits 1    Authorization Type cigna managed    PT Start Time 1600    PT Stop Time 1629    PT Time Calculation (min) 29 min    Activity Tolerance Patient tolerated treatment well    Behavior During Therapy WFL for tasks assessed/performed             Past Medical History:  Diagnosis Date   Allergy    seasonal   Antiphospholipid syndrome (HCC)    Anxiety    Arthritis    deterioration of spine, knees   Carpal tunnel syndrome of right wrist    Clotting disorder (HCC)    antiphospholipid syndrome   Depression    Encounter for general adult medical examination with abnormal findings 07/03/2022   Essential hypertension, benign 02/10/2020   Hyperlipidemia 08/02/2022   Sleep disorder 02/22/2016   Past Surgical History:  Procedure Laterality Date   BREAST BIOPSY Left 08/10/2022   [place] LT BREAST BX W LOC DEV 1ST LESION IMG BX SPEC US GUIDE 08/10/2022 [facility]   BREAST BIOPSY Left 08/18/2022   MM LT BREAST BX W LOC DEV EA AD LESION IMG BX SPEC STEREO GUIDE 08/18/2022 [facility]   BREAST BIOPSY Left 08/18/2022   MM LT BREAST BX W LOC DEV 1ST LESION IMAGE BX SPEC STEREO GUIDE 08/18/2022 [facility]   CARPAL TUNNEL RELEASE Right 01/09/2019   Procedure: RIGHT CARPAL TUNNEL RELEASE;  Surgeon: Vickki Hearing, MD;  Location: [facility];  Service: Orthopedics;  Laterality: Right;   DILATION AND CURETTAGE OF UTERUS     x2   EYE SURGERY     lazy eye   IRRIGATION AND DEBRIDEMENT HEMATOMA Left 10/05/2022   Procedure: HEMATOMA EVACUATION OF LEFT BREAST WITH JP DRAIN PLACEMENT;  Surgeon: [name], MD;  Location: [facility];  Service: General;  Laterality: Left;   PARTIAL HYSTERECTOMY  2011   PORTACATH PLACEMENT Right 10/05/2022    Procedure: INSERTION PORT-A-CATH;  Surgeon: [name], MD;  Location: [facility];  Service: General;  Laterality: Right;   SIMPLE MASTECTOMY WITH AXILLARY SENTINEL NODE BIOPSY Left 08/28/2022   Procedure: SIMPLE MASTECTOMY WITH AXILLARY SENTINEL NODE BIOPSY;  Surgeon: [name], MD;  Location: [facility];  Service: General;  Laterality: Left;   Patient Active Problem List   Diagnosis Date Noted   Morbid obesity (HCC) 01/29/2023   Genetic testing 01/16/2023   Hepatic steatosis 10/30/2022   Breast cancer of upper-inner quadrant of left female breast (HCC) 10/01/2022   Hematoma 09/21/2022   Breast cancer (HCC) 08/28/2022   Ductal carcinoma in situ (DCIS) of left breast 08/28/2022   Malignant neoplasm of central portion of left breast in female, estrogen receptor positive (HCC) 08/24/2022   Prediabetes 08/02/2022   Hyperlipidemia 08/02/2022   Subareolar mass of left breast 07/03/2022   Encounter for general adult medical examination with abnormal findings 07/03/2022   Impaired glucose tolerance 09/15/2021   Anxiety 09/15/2021   Radiculopathy of arm 09/15/2021   Chronic pain of right ankle 09/15/2021   Screening for diabetes mellitus (DM) 09/15/2021   Need for hepatitis C screening test 09/15/2021   Encounter for screening mammogram for malignant neoplasm of breast 09/15/2021   Current every day smoker 09/15/2021  Left lateral ankle pain 09/22/2020   Numbness and tingling 09/22/2020   Vitamin D insufficiency 09/22/2020   Localized swelling, mass, or lump of lower extremity, bilateral 02/10/2020   Essential hypertension, benign 02/10/2020   S/P carpal tunnel release right 01/09/2019 01/14/2019   Carpal tunnel syndrome of right wrist    Panic disorder with agoraphobia 08/21/2016   Antiphospholipid syndrome (HCC) 02/22/2016   Tobacco abuse 02/22/2016   Family history of breast cancer 02/22/2016   Lumbago 02/22/2016   Sleep disorder 02/22/2016   Depression, major, recurrent,  in partial remission (HCC) 02/22/2016    PCP: Christel Mormon  REFERRING PROVIDER: Ronny Bacon, PA-C  REFERRING DIAG: [phone] (ICD-10-CM) - Malignant neoplasm of upper-inner quadrant of left breast in female, estrogen receptor positive (HCC)  THERAPY DIAG:  lymphedema  Rationale for Evaluation and Treatment: Rehabilitation  ONSET DATE: 08/28/22  SUBJECTIVE:                                                                                                                                                                                           SUBJECTIVE STATEMENT:  Pt states that she had her mastectomy in March and has just finished  Chemo and she is going to Radiation at the end of the month.  She states that she has noted a little edema above the surgical site.   PERTINENT HISTORY:   PAIN:  Are you having pain? Yes NPRS scale: 3/10 Pain location: surgical site  Pain orientation: Left  PAIN TYPE: throbbing Pain description: intermittent  Aggravating factors: no rhyme or reason  Relieving factors: manual   PRECAUTIONS: Other: cellulitis      WEIGHT BEARING RESTRICTIONS: No  FALLS:  Has patient fallen in last 6 months? No  LIVING ENVIRONMENT: Lives with: lives with their family Lives in: House/apartment  HAND DOMINANCE: right   PRIOR LEVEL OF FUNCTION: Independent  PATIENT GOALS: not sure.     OBJECTIVE:  COGNITION: Overall cognitive status: Within functional limits for tasks assessed     UPPER EXTREMITY AROM: WFL  CERVICAL AROM: WFL   LYMPHEDEMA ASSESSMENTS:   SURGERY TYPE/DATE: 08/28/22  NUMBER OF LYMPH NODES REMOVED: 2  CHEMOTHERAPY: ended last week   RADIATION:will start at the end of this week   HORMONE TREATMENT: no  INFECTIONS: no  LYMPHEDEMA ASSESSMENTS: PT is Rt handed   LANDMARK RIGHT   eval  10 cm proximal to olecranon process 35.2  Olecranon process 28  10 cm proximal to ulnar styloid process 21.7  Just proximal  to ulnar styloid process 16.3  Across hand at thumb web space 19.9     LANDMARK LEFT  eval  10 cm proximal to olecranon process 39  Olecranon process 28  10 cm proximal to ulnar styloid process 21.5  Just proximal to ulnar styloid process 16.3  Across hand at thumb web space 19.5  At base of 2nd digit 6.8  (Blank rows = not tested)  TODAY'S TREATMENT:                                                                                                                              DATE: 03/19/23 Evaluation   Education on HEP as well as self manual   PATIENT EDUCATION:  Education details: HEP and self manual for swelling in axillary area  Person educated: Patient Education method: Explanation Education comprehension: verbalized understanding, returned demonstration, verbal cues required, and tactile cues required  HOME EXERCISE PROGRAM: Access Code: [email or web address] URL: [email or web address] Date: 03/19/2023 Prepared by: Virgina Organ  Exercises - Seated Cervical Rotation AROM  - 1 x daily - 2 x weekly - 1 sets - 5 reps - 2 seconds  hold - Seated Cervical Sidebending AROM  - 1 x daily - 2 x weekly - 1 sets - 5 reps - Seated Shoulder Flexion Full Range  - 1 x daily - 2 x weekly - 1 sets - 5 reps - Seated Shoulder Abduction  - 1 x daily - 2 x weekly - 1 sets - 5 reps - Seated Shoulder Abduction and External Rotation AROM  - 1 x daily - 2 x weekly - 1 sets - 5 reps  ASSESSMENT:  CLINICAL IMPRESSION: Patient is a 47 y.o. female who was seen today for physical therapy evaluation and treatment following Lt mastectomy.  Pt cervical and shoulder ROM is WNL, there is no edema in the UE.  There is edema in the Lt upper chest  area which may still be from the surgery.  Therapist educated pt in self manual techniques that she can complete to decrease the swelling as well as ROM exercises.  The pt will be discharged at this time but was advised that if the swelling does not decrease or  if she notes any increased swelling in the Lt UE at any point in the future she should contact her MD and request to be referred to the lymphedema clinic.    OBJECTIVE IMPAIRMENTS: increased edema.   ACTIVITY LIMITATIONS: hygiene/grooming   REHAB POTENTIAL: Good  CLINICAL DECISION MAKING: Stable/uncomplicated  EVALUATION COMPLEXITY: Low  GOALS: Goals reviewed with patient? No  SHORT TERM GOALS: Target date: 03/19/23  PT to be I in HEP to increase lymphatic circulation as well as prevent any tightness of cervical or shoulder area.,  Baseline: Goal status: met  2.  PT to be in self manual techniques Baseline:  Goal status: met  PLAN:  PT FREQUENCY: 1x/week  PT DURATION: 1 week  PLANNED INTERVENTIONS: Therapeutic exercises, Self Care, , Manual lymph drainage,   PLAN FOR NEXT SESSION: PT to be discharged to self care.  Virgina Organ, PT CLT [phone]  03/19/2023, 4:35 PM

## 2023-03-17 ENCOUNTER — Other Ambulatory Visit: Payer: Self-pay

## 2023-03-19 ENCOUNTER — Other Ambulatory Visit: Payer: Self-pay

## 2023-03-19 ENCOUNTER — Ambulatory Visit (HOSPITAL_COMMUNITY): Payer: Managed Care, Other (non HMO) | Attending: Radiation Oncology | Admitting: Physical Therapy

## 2023-03-19 DIAGNOSIS — Z170 Estrogen receptor positive status [ER+]: Secondary | ICD-10-CM | POA: Diagnosis not present

## 2023-03-19 DIAGNOSIS — C50212 Malignant neoplasm of upper-inner quadrant of left female breast: Secondary | ICD-10-CM | POA: Diagnosis not present

## 2023-03-19 DIAGNOSIS — I972 Postmastectomy lymphedema syndrome: Secondary | ICD-10-CM | POA: Insufficient documentation

## 2023-04-03 ENCOUNTER — Ambulatory Visit: Payer: Managed Care, Other (non HMO) | Admitting: Nutrition

## 2023-04-04 ENCOUNTER — Other Ambulatory Visit: Payer: Self-pay

## 2023-04-05 ENCOUNTER — Ambulatory Visit: Payer: Managed Care, Other (non HMO) | Admitting: Plastic Surgery

## 2023-04-05 ENCOUNTER — Encounter: Payer: Self-pay | Admitting: Plastic Surgery

## 2023-04-05 ENCOUNTER — Inpatient Hospital Stay (HOSPITAL_BASED_OUTPATIENT_CLINIC_OR_DEPARTMENT_OTHER): Payer: Managed Care, Other (non HMO) | Admitting: Hematology

## 2023-04-05 ENCOUNTER — Inpatient Hospital Stay: Payer: Managed Care, Other (non HMO) | Attending: Hematology

## 2023-04-05 VITALS — BP 127/87 | HR 111 | Ht 67.0 in | Wt 265.0 lb

## 2023-04-05 DIAGNOSIS — Z9012 Acquired absence of left breast and nipple: Secondary | ICD-10-CM | POA: Diagnosis not present

## 2023-04-05 DIAGNOSIS — C50212 Malignant neoplasm of upper-inner quadrant of left female breast: Secondary | ICD-10-CM

## 2023-04-05 DIAGNOSIS — Z79810 Long term (current) use of selective estrogen receptor modulators (SERMs): Secondary | ICD-10-CM | POA: Insufficient documentation

## 2023-04-05 DIAGNOSIS — Z800 Family history of malignant neoplasm of digestive organs: Secondary | ICD-10-CM | POA: Diagnosis not present

## 2023-04-05 DIAGNOSIS — Z6841 Body Mass Index (BMI) 40.0 and over, adult: Secondary | ICD-10-CM | POA: Diagnosis not present

## 2023-04-05 DIAGNOSIS — Z170 Estrogen receptor positive status [ER+]: Secondary | ICD-10-CM | POA: Diagnosis not present

## 2023-04-05 DIAGNOSIS — G629 Polyneuropathy, unspecified: Secondary | ICD-10-CM | POA: Diagnosis not present

## 2023-04-05 DIAGNOSIS — C50112 Malignant neoplasm of central portion of left female breast: Secondary | ICD-10-CM

## 2023-04-05 DIAGNOSIS — E559 Vitamin D deficiency, unspecified: Secondary | ICD-10-CM | POA: Insufficient documentation

## 2023-04-05 DIAGNOSIS — F17210 Nicotine dependence, cigarettes, uncomplicated: Secondary | ICD-10-CM | POA: Insufficient documentation

## 2023-04-05 DIAGNOSIS — Z808 Family history of malignant neoplasm of other organs or systems: Secondary | ICD-10-CM | POA: Insufficient documentation

## 2023-04-05 DIAGNOSIS — E669 Obesity, unspecified: Secondary | ICD-10-CM | POA: Diagnosis not present

## 2023-04-05 LAB — COMPREHENSIVE METABOLIC PANEL
ALT: 74 U/L — ABNORMAL HIGH (ref 0–44)
AST: 43 U/L — ABNORMAL HIGH (ref 15–41)
Albumin: 3.9 g/dL (ref 3.5–5.0)
Alkaline Phosphatase: 80 U/L (ref 38–126)
Anion gap: 9 (ref 5–15)
BUN: 11 mg/dL (ref 6–20)
CO2: 25 mmol/L (ref 22–32)
Calcium: 8.9 mg/dL (ref 8.9–10.3)
Chloride: 102 mmol/L (ref 98–111)
Creatinine, Ser: 0.69 mg/dL (ref 0.44–1.00)
GFR, Estimated: >60 mL/min (ref >60)
Glucose, Bld: 97 mg/dL (ref 70–99)
Potassium: 3.7 mmol/L (ref 3.5–5.1)
Sodium: 136 mmol/L (ref 135–145)
Total Bilirubin: 0.5 mg/dL (ref 0.3–1.2)
Total Protein: 6.9 g/dL (ref 6.5–8.1)

## 2023-04-05 LAB — CBC WITH DIFFERENTIAL/PLATELET
Abs Immature Granulocytes: 0.13 10*3/uL — ABNORMAL HIGH (ref 0.00–0.07)
Basophils Absolute: 0.1 10*3/uL (ref 0.0–0.1)
Basophils Relative: 1 %
Eosinophils Absolute: 0.4 10*3/uL (ref 0.0–0.5)
Eosinophils Relative: 5 %
HCT: 41.8 % (ref 36.0–46.0)
Hemoglobin: 13.6 g/dL (ref 12.0–15.0)
Immature Granulocytes: 2 %
Lymphocytes Relative: 14 %
Lymphs Abs: 1.1 10*3/uL (ref 0.7–4.0)
MCH: 28.9 pg (ref 26.0–34.0)
MCHC: 32.5 g/dL (ref 30.0–36.0)
MCV: 88.7 fL (ref 80.0–100.0)
Monocytes Absolute: 0.5 10*3/uL (ref 0.1–1.0)
Monocytes Relative: 7 %
Neutro Abs: 5.5 10*3/uL (ref 1.7–7.7)
Neutrophils Relative %: 71 %
Platelets: 317 10*3/uL (ref 150–400)
RBC: 4.71 MIL/uL (ref 3.87–5.11)
RDW: 13.8 % (ref 11.5–15.5)
WBC: 7.8 10*3/uL (ref 4.0–10.5)
nRBC: 0 % (ref 0.0–0.2)

## 2023-04-05 LAB — MAGNESIUM: Magnesium: 2.1 mg/dL (ref 1.7–2.4)

## 2023-04-05 MED ORDER — HEPARIN SOD (PORK) LOCK FLUSH 100 UNIT/ML IV SOLN
500.0000 [IU] | Freq: Once | INTRAVENOUS | Status: AC
  Administered 2023-04-05: 500 [IU] via INTRAVENOUS

## 2023-04-05 MED ORDER — SODIUM CHLORIDE 0.9% FLUSH
10.0000 mL | Freq: Once | INTRAVENOUS | Status: AC
  Administered 2023-04-05: 10 mL via INTRAVENOUS

## 2023-04-05 MED ORDER — TAMOXIFEN CITRATE 20 MG PO TABS: 20.0000 mg | ORAL_TABLET | Freq: Every day | ORAL | 3 refills | Status: DC

## 2023-04-05 NOTE — Patient Instructions (Addendum)
[facility] at [facility] Discharge Instructions   You were seen and examined today by Dr. Ellin Saba.  He reviewed the results of your lab work from today which are normal.   He reviewed the hormone labs we drew at last visit which show you are in the postmenopausal range.   We will send an estrogen suppressing pill called Tamoxifen to your pharmacy. This is meant to prevent the cancer from coming back. You will take this pill for at least 5 years.   We will see you back in 4 months. We will repeat lab work prior this visit.   Return as scheduled.    Thank you for choosing [facility] at [facility] to provide your oncology and hematology care.  To afford each patient quality time with our provider, please arrive at least 15 minutes before your scheduled appointment time.   If you have a lab appointment with the Cancer Center please come in thru the Main Entrance and check in at the main information desk.  You need to re-schedule your appointment should you arrive 10 or more minutes late.  We strive to give you quality time with our providers, and arriving late affects you and other patients whose appointments are after yours.  Also, if you no show three or more times for appointments you may be dismissed from the clinic at the providers discretion.     Again, thank you for choosing [facility].  Our hope is that these requests will decrease the amount of time that you wait before being seen by our physicians.       _____________________________________________________________  Should you have questions after your visit to [facility], please contact our office at [phone] and follow the prompts.  Our office hours are 8:00 a.m. and 4:30 p.m. Monday - Friday.  Please note that voicemails left after 4:00 p.m. may not be returned until the following business day.  We are closed weekends and major holidays.  You do  have access to a nurse 24-7, just call the main number to the clinic [phone] and do not press any options, hold on the line and a nurse will answer the phone.    For prescription refill requests, have your pharmacy contact our office and allow 72 hours.    Due to Covid, you will need to wear a mask upon entering the hospital. If you do not have a mask, a mask will be given to you at the Main Entrance upon arrival. For doctor visits, patients may have 1 support person age 78 or older with them. For treatment visits, patients can not have anyone with them due to social distancing guidelines and our immunocompromised population.

## 2023-04-05 NOTE — Progress Notes (Signed)
[facility] [address] [place] [address]    Clinic Day:  04/05/23    Referring physician: [name], MD  Patient Care Team: [name], MD as PCP - General (Internal Medicine) [name], MD as Consulting Physician (Hematology)   ASSESSMENT & PLAN:   Assessment: 1.  T1cN1 G2 left breast IDC, ER/PR positive, HER2 negative: - She felt a lump in her breast in December 2023. - Left breast subareolar mass at 9:00 biopsy (08/10/2022): grade 2 IDC, ER 95% strong intensity, PR 90% strong intensity, Ki-67 10%, HER2 1+ - Left mastectomy and SLNB (08/28/2022): 1.6 x 1.2 x 0.7 cm IDC, margins negative, LVI present, 1/1 lymph node with metastatic carcinoma, metastasis 15 mm, focal extracapsular extension.  PT1 cpN1a. 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  No prior history of arterial/venous thrombosis/strokes. - PET scan (10/19/2022): No evidence of hypermetabolic metastatic disease.  Diffuse hepatic steatosis. - 4 cycles of adjuvant AC from 10/25/2022 through 12/12/2022.  Weekly paclitaxel started on 12/27/2022, completed on 03/15/2023 - Germline mutation testing: Negative - Tamoxifen 20 mg daily started on 04/05/2023 - XRT to the left chest wall to start on 04/16/2023   2.  Social/family history: - Seen today with her husband and daughter.  She worked as a Conservation officer, nature.  Current active smoker, 5 to 7 cigarettes/day, started at age 45. - Mother had breast cancer at age 92.  Father had skin cancer.  Paternal grandfather had colon cancer.  Maternal grandfather had prostate cancer.  1 maternal cousin had pancreatic cancer.  2 paternal cousins have cancer.    Plan: 1.  T1cN1 G2 left breast IDC, ER/PR positive, HER2 negative: - She has completed last cycle of paclitaxel on 03/15/2023. - We reviewed her LH and estradiol labs which are in the postmenopausal range. - I will start her on tamoxifen and repeat LH and estradiol in the future.  We  discussed side effects in detail. - I will see her back in 4 months with repeat labs.   2.  Peripheral neuropathy: - She has constant numbness in the fingertips and toes with occasional shooting pains.  She does not require any medication intervention.   3.  High risk drug monitoring: - Last echo on 01/31/2023 with EF 50-55%, down from 55 to 60% in April. - She reports occasional palpitations.  She is seeing cardiology later this month.   4.  Low vitamin D levels: - Continue vitamin D 1000 units daily.    No orders of the defined types were placed in this encounter.     Alben Deeds Teague,acting as a Neurosurgeon for [name], MD.,have documented all relevant documentation on the behalf of [name], MD,as directed by  [name], MD while in the presence of [name], MD.  I, [name] MD, have reviewed the above documentation for accuracy and completeness, and I agree with the above.        [name], MD   10/10/20245:38 PM  CHIEF COMPLAINT:   Diagnosis: left breast cancer    Cancer Staging  Breast cancer of upper-inner quadrant of left female breast [facility]) Staging form: Breast, AJCC 8th Edition - Clinical stage from 10/01/2022: Stage IB (cT1c, cN1, cM0, G2, ER+, PR+, HER2-) - Signed by [name], MD on 10/01/2022    Prior Therapy: 1. left mastectomy and +SLNB with Dr. Algis Greenhouse on 08/28/22  2. Adjuvant AC, 4 cycles, 10/25/22 - 12/12/22  Current Therapy:  weekly paclitaxel    HISTORY OF PRESENT ILLNESS:   Oncology History  Breast cancer of upper-inner quadrant of left female breast (HCC)  10/01/2022 Initial Diagnosis   Breast cancer of upper-inner quadrant of left female breast   10/01/2022 Cancer Staging   Staging form: Breast, AJCC 8th Edition - Clinical stage from 10/01/2022: Stage IB (cT1c, cN1, cM0, G2, ER+, PR+, HER2-) - Signed by [name], MD on 10/01/2022 Histopathologic type:  Infiltrating duct carcinoma, NOS Stage prefix: Initial diagnosis Nuclear grade: G2 Histologic grading system: 3 grade system   10/25/2022 -  Chemotherapy   Patient is on Treatment Plan : BREAST ADJUVANT DOSE DENSE AC q14d / PACLitaxel q7d      Genetic Testing   No pathogenic variants identified on the [facility] Common Hereditary Cancers+RNA panel. VUS in PDGFRA called c.502G>C identified. The report date is 01/16/2023.  The Common Hereditary Cancers Panel + RNA offered by [facility] includes sequencing and/or deletion duplication testing of the following 48 genes: APC*, ATM*, AXIN2, BAP1, BARD1, BMPR1A, BRCA1, BRCA2, BRIP1, CDH1, CDK4, CDKN2A (p14ARF), CDKN2A (p16INK4a), CHEK2, CTNNA1, DICER1*, EPCAM*, FH*, GREM1*, HOXB13, KIT, MBD4, MEN1*, MLH1*, MSH2*, MSH3*, MSH6*, MUTYH, NF1*, NTHL1, PALB2, PDGFRA, PMS2*, POLD1*, POLE, PTEN*, RAD51C, RAD51D, SDHA*, SDHB, SDHC*, SDHD, SMAD4, SMARCA4, STK11, TP53, TSC1*, TSC2, VHL.       INTERVAL HISTORY:   Diana Jensen is a 47 y.o. female presenting to clinic today for follow up of left breast cancer. She was last seen by me on 02/22/23.  Today, she states that she is doing well overall. Her appetite level is at 100%. Her energy level is at 80%. She is accompanied by her daughter.   She reports improved energy levels and constant numbness in the toes and occasionally feet. Numbness in the feet is worsened when sitting and lasts for a few seconds at a time. Constant numbness and occasional tingling in the fingertips is stable. She saw Dr. Joellen Jersey PA on 03/14/23 and will start XRT on 04/16/23. She has an appointment with cardiology on 04/11/23 and experiences occasional palpitations. She is taking Vitamin D as prescribed.   PAST MEDICAL HISTORY:   Past Medical History: Past Medical History:  Diagnosis Date   Allergy    seasonal   Antiphospholipid syndrome (HCC)    Anxiety    Arthritis    deterioration of spine, knees   Carpal tunnel syndrome of right wrist     Clotting disorder (HCC)    antiphospholipid syndrome   Depression    Encounter for general adult medical examination with abnormal findings 07/03/2022   Essential hypertension, benign 02/10/2020   Hyperlipidemia 08/02/2022   Sleep disorder 02/22/2016    Surgical History: Past Surgical History:  Procedure Laterality Date   BREAST BIOPSY Left 08/10/2022   [place] LT BREAST BX W LOC DEV 1ST LESION IMG BX SPEC US GUIDE 08/10/2022 [facility]   BREAST BIOPSY Left 08/18/2022   MM LT BREAST BX W LOC DEV EA AD LESION IMG BX SPEC STEREO GUIDE 08/18/2022 [facility]   BREAST BIOPSY Left 08/18/2022   MM LT BREAST BX W LOC DEV 1ST LESION IMAGE BX SPEC STEREO GUIDE 08/18/2022 [facility]   CARPAL TUNNEL RELEASE Right 01/09/2019   Procedure: RIGHT CARPAL TUNNEL RELEASE;  Surgeon: Vickki Hearing, MD;  Location: [facility];  Service: Orthopedics;  Laterality: Right;   DILATION AND CURETTAGE OF UTERUS     x2   EYE SURGERY     lazy eye   IRRIGATION AND DEBRIDEMENT HEMATOMA Left 10/05/2022  Procedure: HEMATOMA EVACUATION OF LEFT BREAST WITH JP DRAIN PLACEMENT;  Surgeon: [name], MD;  Location: [facility];  Service: General;  Laterality: Left;   PARTIAL HYSTERECTOMY  2011   PORTACATH PLACEMENT Right 10/05/2022   Procedure: INSERTION PORT-A-CATH;  Surgeon: [name], MD;  Location: [facility];  Service: General;  Laterality: Right;   SIMPLE MASTECTOMY WITH AXILLARY SENTINEL NODE BIOPSY Left 08/28/2022   Procedure: SIMPLE MASTECTOMY WITH AXILLARY SENTINEL NODE BIOPSY;  Surgeon: [name], MD;  Location: [facility];  Service: General;  Laterality: Left;    Social History: Social History   Socioeconomic History   Marital status: Married    Spouse name: Not on file   Number of children: 2   Years of education: Not on file   Highest education level: Not on file  Occupational History   Not on file  Tobacco Use   Smoking status: Every Day    Current packs/day: 0.50     Average packs/day: 0.5 packs/day for 28.0 years (14.0 ttl pk-yrs)    Types: Cigarettes   Smokeless tobacco: Never   Tobacco comments:    She has been smoking since age 9, smokes half a pack daily,was doing one pack a day until last year   Vaping Use   Vaping status: Never Used  Substance and Sexual Activity   Alcohol use: No   Drug use: No   Sexual activity: Yes    Birth control/protection: Surgical  Other Topics Concern   Not on file  Social History Narrative   Associates degree in Engineer, site. 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 Has 2 children 1 son and 1 daughter. Married x 15 years.   Social Determinants of Health   Financial Resource Strain: High Risk (11/09/2022)   Overall Financial Resource Strain (CARDIA)    Difficulty of Paying Living Expenses: Hard  Food Insecurity: No Food Insecurity (08/30/2022)   Hunger Vital Sign    Worried About Running Out of Food in the Last Year: Never true    Ran Out of Food in the Last Year: Never true  Transportation Needs: No Transportation Needs (08/30/2022)   PRAPARE - Administrator, Civil Service (Medical): No    Lack of Transportation (Non-Medical): No  Physical Activity: Not on file  Stress: Not on file  Social Connections: Not on file  Intimate Partner Violence: Not At Risk (08/30/2022)   Humiliation, Afraid, Rape, and Kick questionnaire    Fear of Current or Ex-Partner: No    Emotionally Abused: No    Physically Abused: No    Sexually Abused: No    Family History: Family History  Problem Relation Age of Onset   Breast cancer Mother 46   Arthritis Mother    Hypertension Mother    Osteoporosis Mother    Thyroid disease Father    Hypertension Father    Skin cancer Father    Mental illness Sister        depression ? bipolar   Dementia Maternal Grandmother    Prostate cancer Maternal Grandfather        prostate   Heart disease Paternal Grandmother    Colon cancer Paternal  Grandfather    Mental illness Daughter    Pancreatic cancer Cousin        maternal cousin   Cancer Cousin        paternal, unknown type   Cancer Cousin        paternal, unknown  type   Cancer - Lung Neg Hx    Cervical cancer Neg Hx     Current Medications:  Current Outpatient Medications:    acetaminophen (TYLENOL) 500 MG tablet, Take 1,000 mg by mouth every 6 (six) hours as needed for headache. (Patient not taking: Reported on 04/05/2023), Disp: , Rfl:    Cholecalciferol (VITAMIN D-3) 25 MCG (1000 UT) CAPS, Take 1 capsule by mouth daily., Disp: , Rfl:    escitalopram (LEXAPRO) 10 MG tablet, Take 1 tablet (10 mg total) by mouth daily., Disp: 90 tablet, Rfl: 1   hydrOXYzine (VISTARIL) 50 MG capsule, Take 1 capsule (50 mg total) by mouth daily as needed for anxiety., Disp: 30 capsule, Rfl: 2   loratadine (CLARITIN) 10 MG tablet, Take 10 mg by mouth daily., Disp: , Rfl:    losartan (COZAAR) 25 MG tablet, Take 1 tablet (25 mg total) by mouth daily., Disp: 90 tablet, Rfl: 1   oxyCODONE (OXY IR/ROXICODONE) 5 MG immediate release tablet, Take 1-2 tablets (5-10 mg total) by mouth every 4 (four) hours as needed for severe pain or breakthrough pain., Disp: 15 tablet, Rfl: 0   tamoxifen (NOLVADEX) 20 MG tablet, Take 1 tablet (20 mg total) by mouth daily., Disp: 90 tablet, Rfl: 3   lidocaine-prilocaine (EMLA) cream, Apply to affected area once (Patient not taking: Reported on 04/05/2023), Disp: 30 g, Rfl: 3   ondansetron (ZOFRAN-ODT) 4 MG disintegrating tablet, Take 1 tablet (4 mg total) by mouth every 6 (six) hours as needed for nausea. (Patient not taking: Reported on 04/05/2023), Disp: 20 tablet, Rfl: 0   prochlorperazine (COMPAZINE) 10 MG tablet, TAKE ONE TABLET BY MOUTH EVERY 6 HOURS AS NEEDED FOR NAUSEA AND VOMITING (Patient not taking: Reported on 04/05/2023), Disp: 60 tablet, Rfl: 3   Allergies: No Known Allergies  REVIEW OF SYSTEMS:   Review of Systems  Constitutional:  Negative for  chills, fatigue and fever.  HENT:   Negative for lump/mass, mouth sores, nosebleeds, sore throat and trouble swallowing.   Eyes:  Negative for eye problems.  Respiratory:  Negative for cough and shortness of breath.   Cardiovascular:  Positive for palpitations. Negative for chest pain and leg swelling.  Gastrointestinal:  Negative for abdominal pain, constipation, diarrhea, nausea and vomiting.  Genitourinary:  Negative for bladder incontinence, difficulty urinating, dysuria, frequency, hematuria and nocturia.   Musculoskeletal:  Negative for arthralgias, back pain, flank pain, myalgias and neck pain.       +surgery site pain upon palpation  Skin:  Negative for itching and rash.  Neurological:  Positive for numbness (in feet and hands). Negative for dizziness and headaches.  Hematological:  Does not bruise/bleed easily.  Psychiatric/Behavioral:  Negative for depression, sleep disturbance and suicidal ideas. The patient is not nervous/anxious.   All other systems reviewed and are negative.    VITALS:   There were no vitals taken for this visit.  Wt Readings from Last 3 Encounters:  04/05/23 265 lb (120.2 kg)  04/05/23 263 lb 9.6 oz (119.6 kg)  03/15/23 262 lb 12.8 oz (119.2 kg)    There is no height or weight on file to calculate BMI.  Performance status (ECOG): 1 - Symptomatic but completely ambulatory  PHYSICAL EXAM:   Physical Exam Vitals and nursing note reviewed. Exam conducted with a chaperone present.  Constitutional:      Appearance: Normal appearance.  Cardiovascular:     Rate and Rhythm: Normal rate and regular rhythm.     Pulses: Normal pulses.  Heart sounds: Normal heart sounds.  Pulmonary:     Effort: Pulmonary effort is normal.     Breath sounds: Normal breath sounds.  Abdominal:     Palpations: Abdomen is soft. There is no hepatomegaly, splenomegaly or mass.     Tenderness: There is no abdominal tenderness.  Musculoskeletal:     Right lower leg: No edema.      Left lower leg: No edema.  Lymphadenopathy:     Cervical: No cervical adenopathy.     Right cervical: No superficial, deep or posterior cervical adenopathy.    Left cervical: No superficial, deep or posterior cervical adenopathy.     Upper Body:     Right upper body: No supraclavicular or axillary adenopathy.     Left upper body: No supraclavicular or axillary adenopathy.  Neurological:     General: No focal deficit present.     Mental Status: She is alert and oriented to person, place, and time.  Psychiatric:        Mood and Affect: Mood normal.        Behavior: Behavior normal.     LABS:      Latest Ref Rng & Units 04/05/2023   10:03 AM 03/15/2023    9:38 AM 03/08/2023    9:25 AM  CBC  WBC 4.0 - 10.5 K/uL 7.8  5.8  6.8   Hemoglobin 12.0 - 15.0 g/dL 82.9  56.2  13.0   Hematocrit 36.0 - 46.0 % 41.8  38.9  36.9   Platelets 150 - 400 K/uL 317  335  304       Latest Ref Rng & Units 04/05/2023   10:03 AM 03/15/2023    9:38 AM 03/08/2023    9:25 AM  CMP  Glucose 70 - 99 mg/dL 97  865  784   BUN 6 - 20 mg/dL 11  10  11    Creatinine 0.44 - 1.00 mg/dL 6.96  2.95  2.84   Sodium 135 - 145 mmol/L 136  141  138   Potassium 3.5 - 5.1 mmol/L 3.7  3.8  3.7   Chloride 98 - 111 mmol/L 102  106  100   CO2 22 - 32 mmol/L 25  23  26    Calcium 8.9 - 10.3 mg/dL 8.9  9.2  8.8   Total Protein 6.5 - 8.1 g/dL 6.9  6.8  6.7   Total Bilirubin 0.3 - 1.2 mg/dL 0.5  0.8  0.5   Alkaline Phos 38 - 126 U/L 80  69  75   AST 15 - 41 U/L 43  58  42   ALT 0 - 44 U/L 74  107  85      No results found for: "CEA1", "CEA" / No results found for: "CEA1", "CEA" No results found for: "PSA1" No results found for: "XLK440" No results found for: "CAN125"  No results found for: "TOTALPROTELP", "ALBUMINELP", "A1GS", "A2GS", "BETS", "BETA2SER", "GAMS", "MSPIKE", "SPEI" No results found for: "TIBC", "FERRITIN", "IRONPCTSAT" No results found for: "LDH"   STUDIES:   No results found.

## 2023-04-05 NOTE — Progress Notes (Signed)
Referring Provider [name], MD [address] [place],  [place] [address]   CC:  Chief Complaint  Patient presents with   consult      Diana Jensen is an 47 y.o. female.  HPI: Ms. Monjaraz is a 48 year old female who underwent left mastectomy and sentinel lymph node biopsy in March 2024 for invasive ductal cell carcinoma.  Patient has subsequently undergone chemotherapy and is being considered for radiation therapy.  She is sent for evaluation for possible breast reconstruction.  No Known Allergies  Outpatient Encounter Medications as of 04/05/2023  Medication Sig   Cholecalciferol (VITAMIN D-3) 25 MCG (1000 UT) CAPS Take 1 capsule by mouth daily.   escitalopram (LEXAPRO) 10 MG tablet Take 1 tablet (10 mg total) by mouth daily.   hydrOXYzine (VISTARIL) 50 MG capsule Take 1 capsule (50 mg total) by mouth daily as needed for anxiety.   loratadine (CLARITIN) 10 MG tablet Take 10 mg by mouth daily.   losartan (COZAAR) 25 MG tablet Take 1 tablet (25 mg total) by mouth daily.   tamoxifen (NOLVADEX) 20 MG tablet Take 1 tablet (20 mg total) by mouth daily.   acetaminophen (TYLENOL) 500 MG tablet Take 1,000 mg by mouth every 6 (six) hours as needed for headache. (Patient not taking: Reported on 04/05/2023)   lidocaine-prilocaine (EMLA) cream Apply to affected area once (Patient not taking: Reported on 04/05/2023)   ondansetron (ZOFRAN-ODT) 4 MG disintegrating tablet Take 1 tablet (4 mg total) by mouth every 6 (six) hours as needed for nausea. (Patient not taking: Reported on 04/05/2023)   oxyCODONE (OXY IR/ROXICODONE) 5 MG immediate release tablet Take 1-2 tablets (5-10 mg total) by mouth every 4 (four) hours as needed for severe pain or breakthrough pain.   prochlorperazine (COMPAZINE) 10 MG tablet TAKE ONE TABLET BY MOUTH EVERY 6 HOURS AS NEEDED FOR NAUSEA AND VOMITING (Patient not taking: Reported on 04/05/2023)   No facility-administered encounter medications on file as of  04/05/2023.     Past Medical History:  Diagnosis Date   Allergy    seasonal   Antiphospholipid syndrome (HCC)    Anxiety    Arthritis    deterioration of spine, knees   Carpal tunnel syndrome of right wrist    Clotting disorder (HCC)    antiphospholipid syndrome   Depression    Encounter for general adult medical examination with abnormal findings 07/03/2022   Essential hypertension, benign 02/10/2020   Hyperlipidemia 08/02/2022   Sleep disorder 02/22/2016    Past Surgical History:  Procedure Laterality Date   BREAST BIOPSY Left 08/10/2022   [place] LT BREAST BX W LOC DEV 1ST LESION IMG BX SPEC US GUIDE 08/10/2022 [facility]   BREAST BIOPSY Left 08/18/2022   MM LT BREAST BX W LOC DEV EA AD LESION IMG BX SPEC STEREO GUIDE 08/18/2022 [facility]   BREAST BIOPSY Left 08/18/2022   MM LT BREAST BX W LOC DEV 1ST LESION IMAGE BX SPEC STEREO GUIDE 08/18/2022 [facility]   CARPAL TUNNEL RELEASE Right 01/09/2019   Procedure: RIGHT CARPAL TUNNEL RELEASE;  Surgeon: Vickki Hearing, MD;  Location: [facility];  Service: Orthopedics;  Laterality: Right;   DILATION AND CURETTAGE OF UTERUS     x2   EYE SURGERY     lazy eye   IRRIGATION AND DEBRIDEMENT HEMATOMA Left 10/05/2022   Procedure: HEMATOMA EVACUATION OF LEFT BREAST WITH JP DRAIN PLACEMENT;  Surgeon: [name], MD;  Location: [facility];  Service: General;  Laterality: Left;   PARTIAL HYSTERECTOMY  2011   PORTACATH PLACEMENT Right 10/05/2022   Procedure: INSERTION PORT-A-CATH;  Surgeon: [name], MD;  Location: [facility];  Service: General;  Laterality: Right;   SIMPLE MASTECTOMY WITH AXILLARY SENTINEL NODE BIOPSY Left 08/28/2022   Procedure: SIMPLE MASTECTOMY WITH AXILLARY SENTINEL NODE BIOPSY;  Surgeon: [name], MD;  Location: [facility];  Service: General;  Laterality: Left;    Family History  Problem Relation Age of Onset   Breast cancer Mother 43   Arthritis Mother    Hypertension Mother     Osteoporosis Mother    Thyroid disease Father    Hypertension Father    Skin cancer Father    Mental illness Sister        depression ? bipolar   Dementia Maternal Grandmother    Prostate cancer Maternal Grandfather        prostate   Heart disease Paternal Grandmother    Colon cancer Paternal Grandfather    Mental illness Daughter    Pancreatic cancer Cousin        maternal cousin   Cancer Cousin        paternal, unknown type   Cancer Cousin        paternal, unknown type   Cancer - Lung Neg Hx    Cervical cancer Neg Hx     Social History   Social History Narrative   Associates degree in Engineer, site. 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 Has 2 children 1 son and 1 daughter. Married x 15 years.     Review of Systems General: Denies fevers, chills, weight loss CV: Denies chest pain, shortness of breath, palpitations Breast: Left breast surgically absent  Physical Exam    04/05/2023    1:22 PM 04/05/2023   10:46 AM 03/15/2023    1:31 PM  Vitals with BMI  Height 5\' 7"     Weight 265 lbs 263 lbs 10 oz   BMI 41.5    Systolic 127 122 962  Diastolic 87 94 49  Pulse 111 86 83    General:  No acute distress,  Alert and oriented, Non-Toxic, Normal speech and affect Breast: Patient has undergone a left mastectomy with sentinel lymph node biopsy. Mammogram: Not applicable Assessment/Plan Breast cancer: Patient has undergone a left mastectomy with subsequent chemotherapy.  Radiation therapy has been strongly recommended.  Notes indicate that she was encouraged to begin radiation therapy prior to her reconstruction due to the urgent need for continued oncologic therapy.  I discussed in general terms with the patient and her mother the technique for tissue expander placement and subsequent reconstruction with a silicone implant.  I did tell them that if the patient is to undergo radiation therapy prior to reconstruction that she would likely not be a  candidate for this form of reconstruction.  I described in detail the technique for free tissue transfer specifically the D IEP flap for reconstruction of the breast.  We discussed the anatomy and the indications for using tissue in the face of radiation.  She is asked that I refer her to Dr Marita Kansas for further discussions of possible free tissue transfer.  She understands that a referral is not a guarantee that Dr.Ogunleye will agree to perform free tissue transfer as she has other medical conditions including a hypercoagulability syndrome.  Additionally she is somewhat obese and may not be a candidate for a D IEP flap.  Will place the referral at  her request.  I have told her that she may follow-up with me if she has any questions or if there is anything that I can do to assist her.  Santiago Glad 04/05/2023, 4:59 PM

## 2023-04-07 ENCOUNTER — Other Ambulatory Visit: Payer: Self-pay

## 2023-04-09 ENCOUNTER — Ambulatory Visit
Admission: RE | Admit: 2023-04-09 | Discharge: 2023-04-09 | Disposition: A | Discharge status: Home / Self Care | Payer: Managed Care, Other (non HMO) | Source: Ambulatory Visit | Attending: Radiation Oncology | Admitting: Radiation Oncology

## 2023-04-09 DIAGNOSIS — C50212 Malignant neoplasm of upper-inner quadrant of left female breast: Secondary | ICD-10-CM | POA: Diagnosis present

## 2023-04-09 DIAGNOSIS — Z510 Encounter for antineoplastic radiation therapy: Secondary | ICD-10-CM | POA: Insufficient documentation

## 2023-04-10 ENCOUNTER — Other Ambulatory Visit: Payer: Self-pay | Admitting: Radiation Oncology

## 2023-04-10 DIAGNOSIS — Z170 Estrogen receptor positive status [ER+]: Secondary | ICD-10-CM

## 2023-04-11 ENCOUNTER — Ambulatory Visit: Payer: Managed Care, Other (non HMO) | Admitting: Internal Medicine

## 2023-04-17 DIAGNOSIS — Z510 Encounter for antineoplastic radiation therapy: Secondary | ICD-10-CM | POA: Diagnosis not present

## 2023-04-18 ENCOUNTER — Ambulatory Visit
Admission: RE | Admit: 2023-04-18 | Discharge: 2023-04-18 | Disposition: A | Discharge status: Home / Self Care | Payer: Managed Care, Other (non HMO) | Source: Ambulatory Visit | Attending: Radiation Oncology | Admitting: Radiation Oncology

## 2023-04-18 ENCOUNTER — Other Ambulatory Visit: Payer: Self-pay

## 2023-04-18 ENCOUNTER — Ambulatory Visit: Payer: Managed Care, Other (non HMO) | Admitting: Physical Therapy

## 2023-04-18 DIAGNOSIS — Z510 Encounter for antineoplastic radiation therapy: Secondary | ICD-10-CM | POA: Diagnosis not present

## 2023-04-18 LAB — RAD ONC ARIA SESSION SUMMARY

## 2023-04-19 ENCOUNTER — Other Ambulatory Visit: Payer: Self-pay

## 2023-04-19 ENCOUNTER — Ambulatory Visit
Admission: RE | Admit: 2023-04-19 | Discharge: 2023-04-19 | Disposition: A | Discharge status: Home / Self Care | Payer: Managed Care, Other (non HMO) | Source: Ambulatory Visit | Attending: Radiation Oncology | Admitting: Radiation Oncology

## 2023-04-19 DIAGNOSIS — Z170 Estrogen receptor positive status [ER+]: Secondary | ICD-10-CM

## 2023-04-19 DIAGNOSIS — Z510 Encounter for antineoplastic radiation therapy: Secondary | ICD-10-CM | POA: Diagnosis not present

## 2023-04-19 DIAGNOSIS — C50212 Malignant neoplasm of upper-inner quadrant of left female breast: Secondary | ICD-10-CM

## 2023-04-19 LAB — RAD ONC ARIA SESSION SUMMARY

## 2023-04-19 MED ORDER — ALRA NON-METALLIC DEODORANT (RAD-ONC)
1.0000 | Freq: Once | TOPICAL | Status: AC
Start: 2023-04-19 — End: 2023-04-19
  Administered 2023-04-19: 1 via TOPICAL

## 2023-04-19 MED ORDER — RADIAPLEXRX EX GEL
Freq: Once | CUTANEOUS | Status: AC
Start: 2023-04-19 — End: 2023-04-19

## 2023-04-20 ENCOUNTER — Ambulatory Visit
Admission: RE | Admit: 2023-04-20 | Discharge: 2023-04-20 | Disposition: A | Discharge status: Home / Self Care | Payer: Managed Care, Other (non HMO) | Source: Ambulatory Visit | Attending: Radiation Oncology

## 2023-04-20 ENCOUNTER — Other Ambulatory Visit: Payer: Self-pay

## 2023-04-20 DIAGNOSIS — Z510 Encounter for antineoplastic radiation therapy: Secondary | ICD-10-CM | POA: Diagnosis not present

## 2023-04-20 LAB — RAD ONC ARIA SESSION SUMMARY
Course Elapsed Days: 2
Plan Fractions Treated to Date: 2
Plan Fractions Treated to Date: 3
Plan Prescribed Dose Per Fraction: 1.8 Gy
Plan Prescribed Dose Per Fraction: 1.8 Gy
Plan Total Fractions Prescribed: 14
Plan Total Fractions Prescribed: 28
Plan Total Prescribed Dose: 25.2 Gy
Plan Total Prescribed Dose: 50.4 Gy
Reference Point Dosage Given to Date: 3.6 Gy
Reference Point Dosage Given to Date: 5.4 Gy
Reference Point Session Dosage Given: 1.8 Gy
Reference Point Session Dosage Given: 1.8 Gy
Session Number: 3

## 2023-04-23 ENCOUNTER — Ambulatory Visit
Admission: RE | Admit: 2023-04-23 | Discharge: 2023-04-23 | Disposition: A | Discharge status: Home / Self Care | Payer: Managed Care, Other (non HMO) | Source: Ambulatory Visit | Attending: Radiation Oncology

## 2023-04-23 ENCOUNTER — Other Ambulatory Visit: Payer: Self-pay

## 2023-04-23 DIAGNOSIS — Z510 Encounter for antineoplastic radiation therapy: Secondary | ICD-10-CM | POA: Diagnosis not present

## 2023-04-23 LAB — RAD ONC ARIA SESSION SUMMARY
Course Elapsed Days: 5
Plan Fractions Treated to Date: 2
Plan Fractions Treated to Date: 4
Plan Prescribed Dose Per Fraction: 1.8 Gy
Plan Prescribed Dose Per Fraction: 1.8 Gy
Plan Total Fractions Prescribed: 14
Plan Total Fractions Prescribed: 28
Plan Total Prescribed Dose: 25.2 Gy
Plan Total Prescribed Dose: 50.4 Gy
Reference Point Dosage Given to Date: 3.6 Gy
Reference Point Dosage Given to Date: 7.2 Gy
Reference Point Session Dosage Given: 1.8 Gy
Reference Point Session Dosage Given: 1.8 Gy
Session Number: 4

## 2023-04-24 ENCOUNTER — Ambulatory Visit
Admission: RE | Admit: 2023-04-24 | Discharge: 2023-04-24 | Disposition: A | Discharge status: Home / Self Care | Payer: Managed Care, Other (non HMO) | Source: Ambulatory Visit | Attending: Radiation Oncology

## 2023-04-24 ENCOUNTER — Other Ambulatory Visit: Payer: Self-pay

## 2023-04-24 DIAGNOSIS — Z510 Encounter for antineoplastic radiation therapy: Secondary | ICD-10-CM | POA: Diagnosis not present

## 2023-04-24 LAB — RAD ONC ARIA SESSION SUMMARY
Course Elapsed Days: 6
Plan Fractions Treated to Date: 3
Plan Fractions Treated to Date: 5
Plan Prescribed Dose Per Fraction: 1.8 Gy
Plan Prescribed Dose Per Fraction: 1.8 Gy
Plan Total Fractions Prescribed: 14
Plan Total Fractions Prescribed: 28
Plan Total Prescribed Dose: 25.2 Gy
Plan Total Prescribed Dose: 50.4 Gy
Reference Point Dosage Given to Date: 5.4 Gy
Reference Point Dosage Given to Date: 9 Gy
Reference Point Session Dosage Given: 1.8 Gy
Reference Point Session Dosage Given: 1.8 Gy
Session Number: 5

## 2023-04-25 ENCOUNTER — Other Ambulatory Visit: Payer: Self-pay

## 2023-04-25 ENCOUNTER — Ambulatory Visit
Admission: RE | Admit: 2023-04-25 | Discharge: 2023-04-25 | Disposition: A | Discharge status: Home / Self Care | Payer: Managed Care, Other (non HMO) | Source: Ambulatory Visit | Attending: Radiation Oncology | Admitting: Radiation Oncology

## 2023-04-25 DIAGNOSIS — Z510 Encounter for antineoplastic radiation therapy: Secondary | ICD-10-CM | POA: Diagnosis not present

## 2023-04-25 LAB — RAD ONC ARIA SESSION SUMMARY
Course Elapsed Days: 7
Plan Fractions Treated to Date: 3
Plan Fractions Treated to Date: 6
Plan Prescribed Dose Per Fraction: 1.8 Gy
Plan Prescribed Dose Per Fraction: 1.8 Gy
Plan Total Fractions Prescribed: 14
Plan Total Fractions Prescribed: 28
Plan Total Prescribed Dose: 25.2 Gy
Plan Total Prescribed Dose: 50.4 Gy
Reference Point Dosage Given to Date: 10.8 Gy
Reference Point Dosage Given to Date: 5.4 Gy
Reference Point Session Dosage Given: 1.8 Gy
Reference Point Session Dosage Given: 1.8 Gy
Session Number: 6

## 2023-04-26 ENCOUNTER — Other Ambulatory Visit: Payer: Self-pay

## 2023-04-26 ENCOUNTER — Ambulatory Visit
Admission: RE | Admit: 2023-04-26 | Discharge: 2023-04-26 | Disposition: A | Discharge status: Home / Self Care | Payer: Managed Care, Other (non HMO) | Source: Ambulatory Visit | Attending: Radiation Oncology | Admitting: Radiation Oncology

## 2023-04-26 DIAGNOSIS — Z510 Encounter for antineoplastic radiation therapy: Secondary | ICD-10-CM | POA: Diagnosis not present

## 2023-04-26 LAB — RAD ONC ARIA SESSION SUMMARY
Course Elapsed Days: 8
Plan Fractions Treated to Date: 4
Plan Fractions Treated to Date: 7
Plan Prescribed Dose Per Fraction: 1.8 Gy
Plan Prescribed Dose Per Fraction: 1.8 Gy
Plan Total Fractions Prescribed: 14
Plan Total Fractions Prescribed: 28
Plan Total Prescribed Dose: 25.2 Gy
Plan Total Prescribed Dose: 50.4 Gy
Reference Point Dosage Given to Date: 12.6 Gy
Reference Point Dosage Given to Date: 7.2 Gy
Reference Point Session Dosage Given: 1.8 Gy
Reference Point Session Dosage Given: 1.8 Gy
Session Number: 7

## 2023-04-27 ENCOUNTER — Ambulatory Visit
Admission: RE | Admit: 2023-04-27 | Discharge: 2023-04-27 | Disposition: A | Discharge status: Home / Self Care | Payer: Managed Care, Other (non HMO) | Source: Ambulatory Visit | Attending: Radiation Oncology

## 2023-04-27 ENCOUNTER — Ambulatory Visit
Admission: RE | Admit: 2023-04-27 | Discharge: 2023-04-27 | Disposition: A | Discharge status: Home / Self Care | Payer: Managed Care, Other (non HMO) | Source: Ambulatory Visit | Attending: Radiation Oncology | Admitting: Radiation Oncology

## 2023-04-27 ENCOUNTER — Other Ambulatory Visit: Payer: Self-pay

## 2023-04-27 DIAGNOSIS — Z510 Encounter for antineoplastic radiation therapy: Secondary | ICD-10-CM | POA: Diagnosis present

## 2023-04-27 DIAGNOSIS — C50212 Malignant neoplasm of upper-inner quadrant of left female breast: Secondary | ICD-10-CM | POA: Insufficient documentation

## 2023-04-27 LAB — RAD ONC ARIA SESSION SUMMARY
Course Elapsed Days: 9
Plan Fractions Treated to Date: 4
Plan Fractions Treated to Date: 8
Plan Prescribed Dose Per Fraction: 1.8 Gy
Plan Prescribed Dose Per Fraction: 1.8 Gy
Plan Total Fractions Prescribed: 14
Plan Total Fractions Prescribed: 28
Plan Total Prescribed Dose: 25.2 Gy
Plan Total Prescribed Dose: 50.4 Gy
Reference Point Dosage Given to Date: 14.4 Gy
Reference Point Dosage Given to Date: 7.2 Gy
Reference Point Session Dosage Given: 1.8 Gy
Reference Point Session Dosage Given: 1.8 Gy
Session Number: 8

## 2023-04-30 ENCOUNTER — Ambulatory Visit
Admission: RE | Admit: 2023-04-30 | Discharge: 2023-04-30 | Disposition: A | Discharge status: Home / Self Care | Payer: Managed Care, Other (non HMO) | Source: Ambulatory Visit | Attending: Radiation Oncology

## 2023-04-30 ENCOUNTER — Other Ambulatory Visit: Payer: Self-pay

## 2023-04-30 ENCOUNTER — Ambulatory Visit: Payer: Managed Care, Other (non HMO) | Admitting: Internal Medicine

## 2023-04-30 DIAGNOSIS — Z510 Encounter for antineoplastic radiation therapy: Secondary | ICD-10-CM | POA: Diagnosis not present

## 2023-04-30 LAB — RAD ONC ARIA SESSION SUMMARY
Course Elapsed Days: 12
Plan Fractions Treated to Date: 5
Plan Fractions Treated to Date: 9
Plan Prescribed Dose Per Fraction: 1.8 Gy
Plan Prescribed Dose Per Fraction: 1.8 Gy
Plan Total Fractions Prescribed: 14
Plan Total Fractions Prescribed: 28
Plan Total Prescribed Dose: 25.2 Gy
Plan Total Prescribed Dose: 50.4 Gy
Reference Point Dosage Given to Date: 16.2 Gy
Reference Point Dosage Given to Date: 9 Gy
Reference Point Session Dosage Given: 1.8 Gy
Reference Point Session Dosage Given: 1.8 Gy
Session Number: 9

## 2023-05-01 ENCOUNTER — Ambulatory Visit
Admission: RE | Admit: 2023-05-01 | Discharge: 2023-05-01 | Disposition: A | Discharge status: Home / Self Care | Payer: Managed Care, Other (non HMO) | Source: Ambulatory Visit | Attending: Radiation Oncology

## 2023-05-01 ENCOUNTER — Other Ambulatory Visit: Payer: Self-pay

## 2023-05-01 DIAGNOSIS — Z510 Encounter for antineoplastic radiation therapy: Secondary | ICD-10-CM | POA: Diagnosis not present

## 2023-05-01 LAB — RAD ONC ARIA SESSION SUMMARY
Course Elapsed Days: 13
Plan Fractions Treated to Date: 10
Plan Fractions Treated to Date: 5
Plan Prescribed Dose Per Fraction: 1.8 Gy
Plan Prescribed Dose Per Fraction: 1.8 Gy
Plan Total Fractions Prescribed: 14
Plan Total Fractions Prescribed: 28
Plan Total Prescribed Dose: 25.2 Gy
Plan Total Prescribed Dose: 50.4 Gy
Reference Point Dosage Given to Date: 18 Gy
Reference Point Dosage Given to Date: 9 Gy
Reference Point Session Dosage Given: 1.8 Gy
Reference Point Session Dosage Given: 1.8 Gy
Session Number: 10

## 2023-05-02 ENCOUNTER — Other Ambulatory Visit: Payer: Self-pay

## 2023-05-02 ENCOUNTER — Ambulatory Visit
Admission: RE | Admit: 2023-05-02 | Discharge: 2023-05-02 | Disposition: A | Discharge status: Home / Self Care | Payer: Managed Care, Other (non HMO) | Source: Ambulatory Visit | Attending: Radiation Oncology | Admitting: Radiation Oncology

## 2023-05-02 DIAGNOSIS — Z510 Encounter for antineoplastic radiation therapy: Secondary | ICD-10-CM | POA: Diagnosis not present

## 2023-05-02 LAB — RAD ONC ARIA SESSION SUMMARY
Course Elapsed Days: 14
Plan Fractions Treated to Date: 11
Plan Fractions Treated to Date: 6
Plan Prescribed Dose Per Fraction: 1.8 Gy
Plan Prescribed Dose Per Fraction: 1.8 Gy
Plan Total Fractions Prescribed: 14
Plan Total Fractions Prescribed: 28
Plan Total Prescribed Dose: 25.2 Gy
Plan Total Prescribed Dose: 50.4 Gy
Reference Point Dosage Given to Date: 10.8 Gy
Reference Point Dosage Given to Date: 19.8 Gy
Reference Point Session Dosage Given: 1.8 Gy
Reference Point Session Dosage Given: 1.8 Gy
Session Number: 11

## 2023-05-02 NOTE — Therapy (Signed)
OUTPATIENT PHYSICAL THERAPY LYMPHEDEMA EVALUATION  Patient Name: Diana Jensen MRN: [id number] DOB:May 03, 1976, 47 y.o., female Today's Date: 05/03/2023  END OF SESSION:  PT End of Session - 05/03/23 1156     Visit Number 1    Number of Visits 6    Date for PT Re-Evaluation 06/28/23    Authorization Type none needed    PT Start Time 0800    PT Stop Time 0845    PT Time Calculation (min) 45 min    Activity Tolerance Patient tolerated treatment well    Behavior During Therapy [facility] for tasks assessed/performed              Past Medical History:  Diagnosis Date   Allergy    seasonal   Antiphospholipid syndrome (HCC)    Anxiety    Arthritis    deterioration of spine, knees   Carpal tunnel syndrome of right wrist    Clotting disorder (HCC)    antiphospholipid syndrome   Depression    Encounter for general adult medical examination with abnormal findings 07/03/2022   Essential hypertension, benign 02/10/2020   Hyperlipidemia 08/02/2022   Sleep disorder 02/22/2016   Past Surgical History:  Procedure Laterality Date   BREAST BIOPSY Left 08/10/2022   [place] LT BREAST BX W LOC DEV 1ST LESION IMG BX SPEC US GUIDE 08/10/2022 [facility]   BREAST BIOPSY Left 08/18/2022   MM LT BREAST BX W LOC DEV EA AD LESION IMG BX SPEC STEREO GUIDE 08/18/2022 [facility]   BREAST BIOPSY Left 08/18/2022   MM LT BREAST BX W LOC DEV 1ST LESION IMAGE BX SPEC STEREO GUIDE 08/18/2022 [facility]   CARPAL TUNNEL RELEASE Right 01/09/2019   Procedure: RIGHT CARPAL TUNNEL RELEASE;  Surgeon: Vickki Hearing, MD;  Location: [facility];  Service: Orthopedics;  Laterality: Right;   DILATION AND CURETTAGE OF UTERUS     x2   EYE SURGERY     lazy eye   IRRIGATION AND DEBRIDEMENT HEMATOMA Left 10/05/2022   Procedure: HEMATOMA EVACUATION OF LEFT BREAST WITH JP DRAIN PLACEMENT;  Surgeon: [name], MD;  Location: [facility];  Service: General;  Laterality: Left;   PARTIAL HYSTERECTOMY  2011    PORTACATH PLACEMENT Right 10/05/2022   Procedure: INSERTION PORT-A-CATH;  Surgeon: [name], MD;  Location: [facility];  Service: General;  Laterality: Right;   SIMPLE MASTECTOMY WITH AXILLARY SENTINEL NODE BIOPSY Left 08/28/2022   Procedure: SIMPLE MASTECTOMY WITH AXILLARY SENTINEL NODE BIOPSY;  Surgeon: [name], MD;  Location: [facility];  Service: General;  Laterality: Left;   Patient Active Problem List   Diagnosis Date Noted   Morbid obesity (HCC) 01/29/2023   Genetic testing 01/16/2023   Hepatic steatosis 10/30/2022   Breast cancer of upper-inner quadrant of left female breast (HCC) 10/01/2022   Hematoma 09/21/2022   Breast cancer (HCC) 08/28/2022   Ductal carcinoma in situ (DCIS) of left breast 08/28/2022   Malignant neoplasm of central portion of left breast in female, estrogen receptor positive (HCC) 08/24/2022   Prediabetes 08/02/2022   Hyperlipidemia 08/02/2022   Subareolar mass of left breast 07/03/2022   Encounter for general adult medical examination with abnormal findings 07/03/2022   Impaired glucose tolerance 09/15/2021   Anxiety 09/15/2021   Radiculopathy of arm 09/15/2021   Chronic pain of right ankle 09/15/2021   Screening for diabetes mellitus (DM) 09/15/2021   Need for hepatitis C screening test 09/15/2021   Encounter for screening mammogram for malignant neoplasm of breast  09/15/2021   Current every day smoker 09/15/2021   Left lateral ankle pain 09/22/2020   Numbness and tingling 09/22/2020   Vitamin D insufficiency 09/22/2020   Localized swelling, mass, or lump of lower extremity, bilateral 02/10/2020   Essential hypertension, benign 02/10/2020   S/P carpal tunnel release right 01/09/2019 01/14/2019   Carpal tunnel syndrome of right wrist    Panic disorder with agoraphobia 08/21/2016   Antiphospholipid syndrome (HCC) 02/22/2016   Tobacco abuse 02/22/2016   Family history of breast cancer 02/22/2016   Lumbago 02/22/2016   Sleep disorder  02/22/2016   Depression, major, recurrent, in partial remission (HCC) 02/22/2016    PCP: Christel Mormon  REFERRING PROVIDER: Ronny Bacon, PA-C  REFERRING DIAG: Z61.096,E45.4 (ICD-10-CM) - Malignant neoplasm of upper-inner quadrant of left breast in female, estrogen receptor positive (HCC)  THERAPY DIAG:  lymphedema  Rationale for Evaluation and Treatment: Rehabilitation  ONSET DATE: 08/28/22  SUBJECTIVE:                                                                                                                                                                                           SUBJECTIVE STATEMENT:   I have hard scar tissue under the incision.  The other place taught me self massage for the swelling.  My skin is now red.  It is starting to get tender to touch.  I still go until 06/05/23.  I go to radiation after this.  I have my bras and prosthesis.    PERTINENT HISTORY: Lt mastectomy and SLNB in March 2024 for IDC. 2 negative nodes. Has completed chemo and is having radiation.   PAIN:  Are you having pain? No - only when I press on it now from radiation.   PRECAUTIONS: lymphedema risk left   WEIGHT BEARING RESTRICTIONS: No  FALLS:  Has patient fallen in last 6 months? No  LIVING ENVIRONMENT: Lives with: lives with their family Lives in: House/apartment  HAND DOMINANCE: right   PRIOR LEVEL OF FUNCTION: Independent  PATIENT GOALS: not sure.     OBJECTIVE:  COGNITION: Overall cognitive status: Within functional limits for tasks assessed   UPPER EXTREMITY AROM: Shoulder  UPPER EXTREMITY ROM: AROM full and equal   Observations: Well healed incision, tissue hanging over lateral edge from tightness and skin superior to incision also hangs over a bit, seems like skin vs edema.  Pt notes no fluctuation.  Not yet red from radiation.   Palpation: no pitting, hardened scar tissue where seroma was removed inferior lateral incision. Not tender yet from  radiation.   LYMPHEDEMA ASSESSMENTS: PT is Rt handed   LANDMARK RIGHT  Eval last location  10 cm proximal to olecranon process 35.2  Olecranon process 28  10 cm proximal to ulnar styloid process 21.7  Just proximal to ulnar styloid process 16.3  Across hand at thumb web space 19.9    LANDMARK LEFT   Eval last location  10 cm proximal to olecranon process 39  Olecranon process 28  10 cm proximal to ulnar styloid process 21.5  Just proximal to ulnar styloid process 16.3  Across hand at thumb web space 19.5  At base of 2nd digit 6.8  (Blank rows = not tested)  BREAST COMPLAINTS QUESTIONNAIRE (answered for chest wall, no breast) Pain:  8 Heaviness: 0 Swollen feeling:8  Tense Skin: 8 Redness: 0 Bra Print: 10 Size of Pores: 0 Hard feeling:  10 Total:    44 /80 A Score over 9 indicates lymphedema issues in the breast  TODAY'S TREATMENT:                                                                                                                              DATE:   05/03/23 Eval performed Performed scar massage education and performance in supine without lotion as pt has radiation after this.    Direct Scar massage: after scar is healed, no opening, no scab  Place pads of two fingers together directly on the scar starting at one end of the scar. Move the fingers up and down across the scar holding 5 seconds one direction.  Then go opposite direction hold 5 seconds.  Move over to the next section of the scar and repeat.  Work your way along the entire length of the scar.   Next make diagonal movements along the scar holding 5 seconds at one direction. Next movement is side to side. Do not rub fingers over the scar.  Instead keep firm pressure and move scar over the tissue it is on top  PATIENT EDUCATION: Per today's note  Education details: Person educated: Patient Education method: Explanation Education comprehension: verbalized understanding, returned demonstration,  verbal cues required, and tactile cues required  HOME EXERCISE PROGRAM: Self scar massage   ASSESSMENT:  CLINICAL IMPRESSION: Patient is a 47 y.o. female who was seen today for physical therapy evaluation and treatment following Lt mastectomy.  Pt cervical and shoulder ROM is WNL, there is no edema in the UE.  There is no significant edema in the Lt chest wall but pt does have tightness at the incision and skin folds here.  Increased scar tissue density inferior lateral to the incision where seroma was excised.  Pt was educated on self MLD previously, but started today with education on POC during radiation and how we can start treatment but she will need to take a break at some point due to skin sensitivity.  Will start with MT for Lt scar tissue, break as needed, and then reassess.    OBJECTIVE IMPAIRMENTS: increased edema.   ACTIVITY LIMITATIONS: none  REHAB POTENTIAL: Good  CLINICAL DECISION MAKING: Stable/uncomplicated  EVALUATION COMPLEXITY: Low  GOALS: Goals reviewed with patient? No  LONG TERM GOALS: Target date: 06/28/23  PT will be ind with scar tissue self care including massage, silicone tape, for home management  Baseline: Goal status: new  2.  Pt will decrease breast complaints questionnaire to <30 to show improved chest wall status Baseline:  Goal status: new  PLAN:  PT FREQUENCY: 1x/week  PT DURATION: 4 weeks and then break x 3 weeks and then recheck  PLANNED INTERVENTIONS: Therapeutic exercises, Self Care, , Manual lymph drainage,  manual therapy,   PLAN FOR NEXT SESSION: STM/MFR, scar tissue release Lt chest wall with focus on inferior lateral to incision. Break for radiation as needed.   Gwenevere Abbot, PT  05/03/2023, 11:57 AM

## 2023-05-03 ENCOUNTER — Encounter: Payer: Self-pay | Admitting: Rehabilitation

## 2023-05-03 ENCOUNTER — Ambulatory Visit
Admission: RE | Admit: 2023-05-03 | Discharge: 2023-05-03 | Disposition: A | Discharge status: Home / Self Care | Payer: Managed Care, Other (non HMO) | Source: Ambulatory Visit | Attending: Radiation Oncology | Admitting: Radiation Oncology

## 2023-05-03 ENCOUNTER — Other Ambulatory Visit: Payer: Self-pay

## 2023-05-03 ENCOUNTER — Ambulatory Visit: Payer: Managed Care, Other (non HMO) | Attending: Radiation Oncology | Admitting: Rehabilitation

## 2023-05-03 DIAGNOSIS — Z510 Encounter for antineoplastic radiation therapy: Secondary | ICD-10-CM | POA: Diagnosis not present

## 2023-05-03 DIAGNOSIS — Z483 Aftercare following surgery for neoplasm: Secondary | ICD-10-CM | POA: Diagnosis not present

## 2023-05-03 DIAGNOSIS — Z9189 Other specified personal risk factors, not elsewhere classified: Secondary | ICD-10-CM | POA: Insufficient documentation

## 2023-05-03 DIAGNOSIS — L599 Disorder of the skin and subcutaneous tissue related to radiation, unspecified: Secondary | ICD-10-CM | POA: Insufficient documentation

## 2023-05-03 DIAGNOSIS — C50212 Malignant neoplasm of upper-inner quadrant of left female breast: Secondary | ICD-10-CM | POA: Diagnosis present

## 2023-05-03 DIAGNOSIS — Z170 Estrogen receptor positive status [ER+]: Secondary | ICD-10-CM | POA: Insufficient documentation

## 2023-05-03 LAB — RAD ONC ARIA SESSION SUMMARY
Course Elapsed Days: 15
Plan Fractions Treated to Date: 12
Plan Fractions Treated to Date: 6
Plan Prescribed Dose Per Fraction: 1.8 Gy
Plan Prescribed Dose Per Fraction: 1.8 Gy
Plan Total Fractions Prescribed: 14
Plan Total Fractions Prescribed: 28
Plan Total Prescribed Dose: 25.2 Gy
Plan Total Prescribed Dose: 50.4 Gy
Reference Point Dosage Given to Date: 10.8 Gy
Reference Point Dosage Given to Date: 21.6 Gy
Reference Point Session Dosage Given: 1.8 Gy
Reference Point Session Dosage Given: 1.8 Gy
Session Number: 12

## 2023-05-04 ENCOUNTER — Ambulatory Visit
Admission: RE | Admit: 2023-05-04 | Discharge: 2023-05-04 | Disposition: A | Discharge status: Home / Self Care | Payer: Managed Care, Other (non HMO) | Source: Ambulatory Visit | Attending: Radiation Oncology

## 2023-05-04 ENCOUNTER — Other Ambulatory Visit: Payer: Self-pay

## 2023-05-04 DIAGNOSIS — Z510 Encounter for antineoplastic radiation therapy: Secondary | ICD-10-CM | POA: Diagnosis not present

## 2023-05-04 LAB — RAD ONC ARIA SESSION SUMMARY
Course Elapsed Days: 16
Plan Fractions Treated to Date: 7
Plan Prescribed Dose Per Fraction: 1.8 Gy
Plan Total Fractions Prescribed: 14
Plan Total Prescribed Dose: 25.2 Gy
Reference Point Dosage Given to Date: 12.6 Gy
Reference Point Session Dosage Given: 1.063 Gy
Session Number: 13

## 2023-05-07 ENCOUNTER — Other Ambulatory Visit: Payer: Self-pay

## 2023-05-07 ENCOUNTER — Ambulatory Visit
Admission: RE | Admit: 2023-05-07 | Discharge: 2023-05-07 | Disposition: A | Discharge status: Home / Self Care | Payer: Managed Care, Other (non HMO) | Source: Ambulatory Visit | Attending: Radiation Oncology

## 2023-05-07 DIAGNOSIS — Z510 Encounter for antineoplastic radiation therapy: Secondary | ICD-10-CM | POA: Diagnosis not present

## 2023-05-07 LAB — RAD ONC ARIA SESSION SUMMARY
Course Elapsed Days: 19
Plan Fractions Treated to Date: 14
Plan Fractions Treated to Date: 7
Plan Prescribed Dose Per Fraction: 1.8 Gy
Plan Prescribed Dose Per Fraction: 1.8 Gy
Plan Total Fractions Prescribed: 14
Plan Total Fractions Prescribed: 28
Plan Total Prescribed Dose: 25.2 Gy
Plan Total Prescribed Dose: 50.4 Gy
Reference Point Dosage Given to Date: 12.6 Gy
Reference Point Dosage Given to Date: 25.2 Gy
Reference Point Session Dosage Given: 1.8 Gy
Reference Point Session Dosage Given: 1.8 Gy
Session Number: 14

## 2023-05-08 ENCOUNTER — Ambulatory Visit
Admission: RE | Admit: 2023-05-08 | Discharge: 2023-05-08 | Disposition: A | Discharge status: Home / Self Care | Payer: Managed Care, Other (non HMO) | Source: Ambulatory Visit | Attending: Radiation Oncology

## 2023-05-08 ENCOUNTER — Other Ambulatory Visit: Payer: Self-pay

## 2023-05-08 ENCOUNTER — Ambulatory Visit: Payer: Managed Care, Other (non HMO)

## 2023-05-08 DIAGNOSIS — Z170 Estrogen receptor positive status [ER+]: Secondary | ICD-10-CM

## 2023-05-08 DIAGNOSIS — C50212 Malignant neoplasm of upper-inner quadrant of left female breast: Secondary | ICD-10-CM | POA: Diagnosis not present

## 2023-05-08 DIAGNOSIS — Z483 Aftercare following surgery for neoplasm: Secondary | ICD-10-CM

## 2023-05-08 DIAGNOSIS — Z9189 Other specified personal risk factors, not elsewhere classified: Secondary | ICD-10-CM

## 2023-05-08 DIAGNOSIS — L599 Disorder of the skin and subcutaneous tissue related to radiation, unspecified: Secondary | ICD-10-CM

## 2023-05-08 DIAGNOSIS — Z510 Encounter for antineoplastic radiation therapy: Secondary | ICD-10-CM | POA: Diagnosis not present

## 2023-05-08 LAB — RAD ONC ARIA SESSION SUMMARY
Course Elapsed Days: 20
Plan Fractions Treated to Date: 15
Plan Fractions Treated to Date: 8
Plan Prescribed Dose Per Fraction: 1.8 Gy
Plan Prescribed Dose Per Fraction: 1.8 Gy
Plan Total Fractions Prescribed: 14
Plan Total Fractions Prescribed: 28
Plan Total Prescribed Dose: 25.2 Gy
Plan Total Prescribed Dose: 50.4 Gy
Reference Point Dosage Given to Date: 14.4 Gy
Reference Point Dosage Given to Date: 27 Gy
Reference Point Session Dosage Given: 1.8 Gy
Reference Point Session Dosage Given: 1.8 Gy
Session Number: 15

## 2023-05-08 NOTE — Therapy (Addendum)
OUTPATIENT PHYSICAL THERAPY LYMPHEDEMA TREATMENT  Patient Name: Diana Jensen MRN: [id number] DOB:07-02-1975, 47 y.o., female Today's Date: 05/08/2023  END OF SESSION:  PT End of Session - 05/08/23 0801     Visit Number 2    Number of Visits 6    Date for PT Re-Evaluation 06/28/23    PT Start Time 0800    PT Stop Time 0857    PT Time Calculation (min) 57 min    Activity Tolerance Patient tolerated treatment well    Behavior During Therapy [facility] for tasks assessed/performed              Past Medical History:  Diagnosis Date   Allergy    seasonal   Antiphospholipid syndrome (HCC)    Anxiety    Arthritis    deterioration of spine, knees   Carpal tunnel syndrome of right wrist    Clotting disorder (HCC)    antiphospholipid syndrome   Depression    Encounter for general adult medical examination with abnormal findings 07/03/2022   Essential hypertension, benign 02/10/2020   Hyperlipidemia 08/02/2022   Sleep disorder 02/22/2016   Past Surgical History:  Procedure Laterality Date   BREAST BIOPSY Left 08/10/2022   [place] LT BREAST BX W LOC DEV 1ST LESION IMG BX SPEC US GUIDE 08/10/2022 [facility]   BREAST BIOPSY Left 08/18/2022   MM LT BREAST BX W LOC DEV EA AD LESION IMG BX SPEC STEREO GUIDE 08/18/2022 [facility]   BREAST BIOPSY Left 08/18/2022   MM LT BREAST BX W LOC DEV 1ST LESION IMAGE BX SPEC STEREO GUIDE 08/18/2022 [facility]   CARPAL TUNNEL RELEASE Right 01/09/2019   Procedure: RIGHT CARPAL TUNNEL RELEASE;  Surgeon: Vickki Hearing, MD;  Location: [facility];  Service: Orthopedics;  Laterality: Right;   DILATION AND CURETTAGE OF UTERUS     x2   EYE SURGERY     lazy eye   IRRIGATION AND DEBRIDEMENT HEMATOMA Left 10/05/2022   Procedure: HEMATOMA EVACUATION OF LEFT BREAST WITH JP DRAIN PLACEMENT;  Surgeon: [name], MD;  Location: [facility];  Service: General;  Laterality: Left;   PARTIAL HYSTERECTOMY  2011   PORTACATH PLACEMENT Right 10/05/2022    Procedure: INSERTION PORT-A-CATH;  Surgeon: [name], MD;  Location: [facility];  Service: General;  Laterality: Right;   SIMPLE MASTECTOMY WITH AXILLARY SENTINEL NODE BIOPSY Left 08/28/2022   Procedure: SIMPLE MASTECTOMY WITH AXILLARY SENTINEL NODE BIOPSY;  Surgeon: [name], MD;  Location: [facility];  Service: General;  Laterality: Left;   Patient Active Problem List   Diagnosis Date Noted   Morbid obesity (HCC) 01/29/2023   Genetic testing 01/16/2023   Hepatic steatosis 10/30/2022   Breast cancer of upper-inner quadrant of left female breast (HCC) 10/01/2022   Hematoma 09/21/2022   Breast cancer (HCC) 08/28/2022   Ductal carcinoma in situ (DCIS) of left breast 08/28/2022   Malignant neoplasm of central portion of left breast in female, estrogen receptor positive (HCC) 08/24/2022   Prediabetes 08/02/2022   Hyperlipidemia 08/02/2022   Subareolar mass of left breast 07/03/2022   Encounter for general adult medical examination with abnormal findings 07/03/2022   Impaired glucose tolerance 09/15/2021   Anxiety 09/15/2021   Radiculopathy of arm 09/15/2021   Chronic pain of right ankle 09/15/2021   Screening for diabetes mellitus (DM) 09/15/2021   Need for hepatitis C screening test 09/15/2021   Encounter for screening mammogram for malignant neoplasm of breast 09/15/2021   Current every day smoker  09/15/2021   Left lateral ankle pain 09/22/2020   Numbness and tingling 09/22/2020   Vitamin D insufficiency 09/22/2020   Localized swelling, mass, or lump of lower extremity, bilateral 02/10/2020   Essential hypertension, benign 02/10/2020   S/P carpal tunnel release right 01/09/2019 01/14/2019   Carpal tunnel syndrome of right wrist    Panic disorder with agoraphobia 08/21/2016   Antiphospholipid syndrome (HCC) 02/22/2016   Tobacco abuse 02/22/2016   Family history of breast cancer 02/22/2016   Lumbago 02/22/2016   Sleep disorder 02/22/2016   Depression, major,  recurrent, in partial remission (HCC) 02/22/2016    PCP: Christel Mormon  REFERRING PROVIDER: Ronny Bacon, PA-C  REFERRING DIAG: O13.086,V78.4 (ICD-10-CM) - Malignant neoplasm of upper-inner quadrant of left breast in female, estrogen receptor positive (HCC)  THERAPY DIAG:  lymphedema  Rationale for Evaluation and Treatment: Rehabilitation  ONSET DATE: 08/28/22  SUBJECTIVE:                                                                                                                                                                                           SUBJECTIVE STATEMENT:   I just started having dermatitis reaction with small itchy bumps on my chest. They said they are going to give me a different cream from the radioplex because they think it might be from that.    PERTINENT HISTORY: Lt mastectomy and SLNB in March 2024 for IDC. 2 negative nodes. Has completed chemo and is having radiation.   PAIN:  Are you having pain? No - just tender and irritated from radiation  PRECAUTIONS: lymphedema risk left   WEIGHT BEARING RESTRICTIONS: No  FALLS:  Has patient fallen in last 6 months? No  LIVING ENVIRONMENT: Lives with: lives with their family Lives in: House/apartment  HAND DOMINANCE: right   PRIOR LEVEL OF FUNCTION: Independent  PATIENT GOALS: not sure.     OBJECTIVE:  COGNITION: Overall cognitive status: Within functional limits for tasks assessed   UPPER EXTREMITY AROM: Shoulder  UPPER EXTREMITY ROM: AROM full and equal   Observations: Well healed incision, tissue hanging over lateral edge from tightness and skin superior to incision also hangs over a bit, seems like skin vs edema.  Pt notes no fluctuation.  Not yet red from radiation.   Palpation: no pitting, hardened scar tissue where seroma was removed inferior lateral incision. Not tender yet from radiation.   LYMPHEDEMA ASSESSMENTS: PT is Rt handed   LANDMARK RIGHT   Eval last location  10  cm proximal to olecranon process 35.2  Olecranon process 28  10 cm proximal to ulnar styloid process 21.7  Just proximal to  ulnar styloid process 16.3  Across hand at thumb web space 19.9    LANDMARK LEFT   Eval last location  10 cm proximal to olecranon process 39  Olecranon process 28  10 cm proximal to ulnar styloid process 21.5  Just proximal to ulnar styloid process 16.3  Across hand at thumb web space 19.5  At base of 2nd digit 6.8  (Blank rows = not tested)  BREAST COMPLAINTS QUESTIONNAIRE (answered for chest wall, no breast) Pain:  8 Heaviness: 0 Swollen feeling:8  Tense Skin: 8 Redness: 0 Bra Print: 10 Size of Pores: 0 Hard feeling:  10 Total:    44 /80 A Score over 9 indicates lymphedema issues in the breast  TODAY'S TREATMENT:                                                                                                                              DATE:   05/08/23: Manual Therapy P/ROM to Lt shoulder into flex, abd and D2 with Lt scapular depression throughout  STM to Lt pect tendon in supine during P/ROM MFR gently to Lt chest wall  Therapeutic Exercises Pulleys into flex and abd x 2 mins each returning therapist demo and VC's during to decrease scapular compensation Roll yellow ball up wall into flex x 10 also returning therapist demo  05/03/23 Eval performed Performed scar massage education and performance in supine without lotion as pt has radiation after this.    Direct Scar massage: after scar is healed, no opening, no scab  Place pads of two fingers together directly on the scar starting at one end of the scar. Move the fingers up and down across the scar holding 5 seconds one direction.  Then go opposite direction hold 5 seconds.  Move over to the next section of the scar and repeat.  Work your way along the entire length of the scar.   Next make diagonal movements along the scar holding 5 seconds at one direction. Next movement is side to side. Do  not rub fingers over the scar.  Instead keep firm pressure and move scar over the tissue it is on top  PATIENT EDUCATION: Per today's note  Education details: Person educated: Patient Education method: Explanation Education comprehension: verbalized understanding, returned demonstration, verbal cues required, and tactile cues required  HOME EXERCISE PROGRAM: Self scar massage   ASSESSMENT:  CLINICAL IMPRESSION: So far pts skin is intact and doin well aside from small area of irritation and itchy. Focused on manual therapy working to decrease fascial restrictions and improve end Lt shoulder P/ROM. Then began AA/ROM which she did well with reporting feeling good stretches. She did require VC's to decrease scap compensation but was able to correct with cuing.   OBJECTIVE IMPAIRMENTS: increased edema.   ACTIVITY LIMITATIONS: none   REHAB POTENTIAL: Good  CLINICAL DECISION MAKING: Stable/uncomplicated  EVALUATION COMPLEXITY: Low  GOALS: Goals reviewed with patient? No  LONG TERM GOALS: Target date:  06/28/23  PT will be ind with scar tissue self care including massage, silicone tape, for home management  Baseline: Goal status: new  2.  Pt will decrease breast complaints questionnaire to <30 to show improved chest wall status Baseline:  Goal status: new  PLAN:  PT FREQUENCY: 1x/week  PT DURATION: 4 weeks and then break x 3 weeks and then recheck  PLANNED INTERVENTIONS: Therapeutic exercises, Self Care, , Manual lymph drainage,  manual therapy,   PLAN FOR NEXT SESSION: Add supine scapular series with yellow theraband but instruct pt to only perform this a few times a week and while skin remains intact; STM/MFR, scar tissue release Lt chest wall with focus on inferior lateral to incision. Break for radiation as needed.   Berna Spare, PTA 05/08/23 9:02 AM

## 2023-05-09 ENCOUNTER — Other Ambulatory Visit: Payer: Self-pay

## 2023-05-09 ENCOUNTER — Ambulatory Visit
Admission: RE | Admit: 2023-05-09 | Discharge: 2023-05-09 | Disposition: A | Discharge status: Home / Self Care | Payer: Managed Care, Other (non HMO) | Source: Ambulatory Visit | Attending: Radiation Oncology

## 2023-05-09 DIAGNOSIS — Z510 Encounter for antineoplastic radiation therapy: Secondary | ICD-10-CM | POA: Diagnosis not present

## 2023-05-09 LAB — RAD ONC ARIA SESSION SUMMARY
Course Elapsed Days: 21
Plan Fractions Treated to Date: 16
Plan Fractions Treated to Date: 8
Plan Prescribed Dose Per Fraction: 1.8 Gy
Plan Prescribed Dose Per Fraction: 1.8 Gy
Plan Total Fractions Prescribed: 14
Plan Total Fractions Prescribed: 28
Plan Total Prescribed Dose: 25.2 Gy
Plan Total Prescribed Dose: 50.4 Gy
Reference Point Dosage Given to Date: 14.4 Gy
Reference Point Dosage Given to Date: 28.8 Gy
Reference Point Session Dosage Given: 1.8 Gy
Reference Point Session Dosage Given: 1.8 Gy
Session Number: 16

## 2023-05-10 ENCOUNTER — Ambulatory Visit
Admission: RE | Admit: 2023-05-10 | Discharge: 2023-05-10 | Disposition: A | Discharge status: Home / Self Care | Payer: Managed Care, Other (non HMO) | Source: Ambulatory Visit | Attending: Radiation Oncology | Admitting: Radiation Oncology

## 2023-05-10 ENCOUNTER — Other Ambulatory Visit: Payer: Self-pay

## 2023-05-10 DIAGNOSIS — Z510 Encounter for antineoplastic radiation therapy: Secondary | ICD-10-CM | POA: Diagnosis not present

## 2023-05-10 LAB — RAD ONC ARIA SESSION SUMMARY
Course Elapsed Days: 22
Plan Fractions Treated to Date: 17
Plan Fractions Treated to Date: 9
Plan Prescribed Dose Per Fraction: 1.8 Gy
Plan Prescribed Dose Per Fraction: 1.8 Gy
Plan Total Fractions Prescribed: 14
Plan Total Fractions Prescribed: 28
Plan Total Prescribed Dose: 25.2 Gy
Plan Total Prescribed Dose: 50.4 Gy
Reference Point Dosage Given to Date: 16.2 Gy
Reference Point Dosage Given to Date: 30.6 Gy
Reference Point Session Dosage Given: 1.8 Gy
Reference Point Session Dosage Given: 1.8 Gy
Session Number: 17

## 2023-05-11 ENCOUNTER — Other Ambulatory Visit: Payer: Self-pay

## 2023-05-11 ENCOUNTER — Ambulatory Visit
Admission: RE | Admit: 2023-05-11 | Discharge: 2023-05-11 | Disposition: A | Discharge status: Home / Self Care | Payer: Managed Care, Other (non HMO) | Source: Ambulatory Visit | Attending: Radiation Oncology | Admitting: Radiation Oncology

## 2023-05-11 DIAGNOSIS — Z510 Encounter for antineoplastic radiation therapy: Secondary | ICD-10-CM | POA: Diagnosis not present

## 2023-05-11 LAB — RAD ONC ARIA SESSION SUMMARY
Course Elapsed Days: 23
Plan Fractions Treated to Date: 18
Plan Fractions Treated to Date: 9
Plan Prescribed Dose Per Fraction: 1.8 Gy
Plan Prescribed Dose Per Fraction: 1.8 Gy
Plan Total Fractions Prescribed: 14
Plan Total Fractions Prescribed: 28
Plan Total Prescribed Dose: 25.2 Gy
Plan Total Prescribed Dose: 50.4 Gy
Reference Point Dosage Given to Date: 16.2 Gy
Reference Point Dosage Given to Date: 32.4 Gy
Reference Point Session Dosage Given: 1.8 Gy
Reference Point Session Dosage Given: 1.8 Gy
Session Number: 18

## 2023-05-14 ENCOUNTER — Other Ambulatory Visit: Payer: Self-pay

## 2023-05-14 ENCOUNTER — Encounter: Payer: Self-pay | Admitting: Rehabilitation

## 2023-05-14 ENCOUNTER — Ambulatory Visit: Payer: Managed Care, Other (non HMO) | Admitting: Rehabilitation

## 2023-05-14 ENCOUNTER — Ambulatory Visit
Admission: RE | Admit: 2023-05-14 | Discharge: 2023-05-14 | Disposition: A | Discharge status: Home / Self Care | Payer: Managed Care, Other (non HMO) | Source: Ambulatory Visit | Attending: Radiation Oncology | Admitting: Radiation Oncology

## 2023-05-14 DIAGNOSIS — C50212 Malignant neoplasm of upper-inner quadrant of left female breast: Secondary | ICD-10-CM | POA: Diagnosis not present

## 2023-05-14 DIAGNOSIS — C50112 Malignant neoplasm of central portion of left female breast: Secondary | ICD-10-CM

## 2023-05-14 DIAGNOSIS — Z510 Encounter for antineoplastic radiation therapy: Secondary | ICD-10-CM | POA: Diagnosis not present

## 2023-05-14 DIAGNOSIS — Z9189 Other specified personal risk factors, not elsewhere classified: Secondary | ICD-10-CM

## 2023-05-14 DIAGNOSIS — Z170 Estrogen receptor positive status [ER+]: Secondary | ICD-10-CM

## 2023-05-14 DIAGNOSIS — I972 Postmastectomy lymphedema syndrome: Secondary | ICD-10-CM

## 2023-05-14 DIAGNOSIS — Z483 Aftercare following surgery for neoplasm: Secondary | ICD-10-CM

## 2023-05-14 DIAGNOSIS — L599 Disorder of the skin and subcutaneous tissue related to radiation, unspecified: Secondary | ICD-10-CM

## 2023-05-14 LAB — RAD ONC ARIA SESSION SUMMARY
Course Elapsed Days: 26
Plan Fractions Treated to Date: 10
Plan Fractions Treated to Date: 19
Plan Prescribed Dose Per Fraction: 1.8 Gy
Plan Prescribed Dose Per Fraction: 1.8 Gy
Plan Total Fractions Prescribed: 14
Plan Total Fractions Prescribed: 28
Plan Total Prescribed Dose: 25.2 Gy
Plan Total Prescribed Dose: 50.4 Gy
Reference Point Dosage Given to Date: 18 Gy
Reference Point Dosage Given to Date: 34.2 Gy
Reference Point Session Dosage Given: 1.8 Gy
Reference Point Session Dosage Given: 1.8 Gy
Session Number: 19

## 2023-05-14 NOTE — Therapy (Addendum)
 OUTPATIENT PHYSICAL THERAPY LYMPHEDEMA TREATMENT  Patient Name: Diana Jensen MRN: [id number] DOB:1976-02-02, 47 y.o., female Today's Date: 05/14/2023  END OF SESSION:  PT End of Session - 05/14/23 0807     Visit Number 3    Number of Visits 6    Date for PT Re-Evaluation 06/28/23    PT Start Time 0810    PT Stop Time 0854    PT Time Calculation (min) 44 min    Activity Tolerance Patient tolerated treatment well    Behavior During Therapy [facility] for tasks assessed/performed              Past Medical History:  Diagnosis Date   Allergy    seasonal   Antiphospholipid syndrome (HCC)    Anxiety    Arthritis    deterioration of spine, knees   Carpal tunnel syndrome of right wrist    Clotting disorder (HCC)    antiphospholipid syndrome   Depression    Encounter for general adult medical examination with abnormal findings 07/03/2022   Essential hypertension, benign 02/10/2020   Hyperlipidemia 08/02/2022   Sleep disorder 02/22/2016   Past Surgical History:  Procedure Laterality Date   BREAST BIOPSY Left 08/10/2022   US  LT BREAST BX W LOC DEV 1ST LESION IMG BX SPEC US  GUIDE 08/10/2022 [facility]   BREAST BIOPSY Left 08/18/2022   MM LT BREAST BX W LOC DEV EA AD LESION IMG BX SPEC STEREO GUIDE 08/18/2022 [facility]   BREAST BIOPSY Left 08/18/2022   MM LT BREAST BX W LOC DEV 1ST LESION IMAGE BX SPEC STEREO GUIDE 08/18/2022 [facility]   CARPAL TUNNEL RELEASE Right 01/09/2019   Procedure: RIGHT CARPAL TUNNEL RELEASE;  Surgeon: Darrin Emerald, MD;  Location: [facility];  Service: Orthopedics;  Laterality: Right;   DILATION AND CURETTAGE OF UTERUS     x2   EYE SURGERY     lazy eye   IRRIGATION AND DEBRIDEMENT HEMATOMA Left 10/05/2022   Procedure: HEMATOMA EVACUATION OF LEFT BREAST WITH JP DRAIN PLACEMENT;  Surgeon: Awilda Bogus, MD;  Location: [facility];  Service: General;  Laterality: Left;   PARTIAL HYSTERECTOMY  2011   PORTACATH PLACEMENT Right 10/05/2022    Procedure: INSERTION PORT-A-CATH;  Surgeon: Awilda Bogus, MD;  Location: [facility];  Service: General;  Laterality: Right;   SIMPLE MASTECTOMY WITH AXILLARY SENTINEL NODE BIOPSY Left 08/28/2022   Procedure: SIMPLE MASTECTOMY WITH AXILLARY SENTINEL NODE BIOPSY;  Surgeon: Awilda Bogus, MD;  Location: [facility];  Service: General;  Laterality: Left;   Patient Active Problem List   Diagnosis Date Noted   Morbid obesity (HCC) 01/29/2023   Genetic testing 01/16/2023   Hepatic steatosis 10/30/2022   Breast cancer of upper-inner quadrant of left female breast (HCC) 10/01/2022   Hematoma 09/21/2022   Breast cancer (HCC) 08/28/2022   Ductal carcinoma in situ (DCIS) of left breast 08/28/2022   Malignant neoplasm of central portion of left breast in female, estrogen receptor positive (HCC) 08/24/2022   Prediabetes 08/02/2022   Hyperlipidemia 08/02/2022   Subareolar mass of left breast 07/03/2022   Encounter for general adult medical examination with abnormal findings 07/03/2022   Impaired glucose tolerance 09/15/2021   Anxiety 09/15/2021   Radiculopathy of arm 09/15/2021   Chronic pain of right ankle 09/15/2021   Screening for diabetes mellitus (DM) 09/15/2021   Need for hepatitis C screening test 09/15/2021   Encounter for screening mammogram for malignant neoplasm of breast 09/15/2021   Current every day smoker  09/15/2021   Left lateral ankle pain 09/22/2020   Numbness and tingling 09/22/2020   Vitamin D  insufficiency 09/22/2020   Localized swelling, mass, or lump of lower extremity, bilateral 02/10/2020   Essential hypertension, benign 02/10/2020   S/P carpal tunnel release right 01/09/2019 01/14/2019   Carpal tunnel syndrome of right wrist    Panic disorder with agoraphobia 08/21/2016   Antiphospholipid syndrome (HCC) 02/22/2016   Tobacco abuse 02/22/2016   Family history of breast cancer 02/22/2016   Lumbago 02/22/2016   Sleep disorder 02/22/2016   Depression, major,  recurrent, in partial remission (HCC) 02/22/2016    PCP: [facility]  REFERRING PROVIDER: Bettejane Brownie, PA-C  REFERRING DIAG: Z36.644,I34.7 (ICD-10-CM) - Malignant neoplasm of upper-inner quadrant of left breast in female, estrogen receptor positive (HCC)  THERAPY DIAG:  lymphedema  Rationale for Evaluation and Treatment: Rehabilitation  ONSET DATE: 08/28/22  SUBJECTIVE:                                                                                                                                                                                           SUBJECTIVE STATEMENT:    No just the dermatitis has been spreading a little more. I feel like it can still tolerate exercise and stretching   PERTINENT HISTORY: Lt mastectomy and SLNB in March 2024 for IDC. 2 negative nodes. Has completed chemo and is having radiation.   PAIN:  Are you having pain? No - just tender and irritated from radiation  PRECAUTIONS: lymphedema risk left   WEIGHT BEARING RESTRICTIONS: No  FALLS:  Has patient fallen in last 6 months? No  LIVING ENVIRONMENT: Lives with: lives with their family Lives in: House/apartment  HAND DOMINANCE: right   PRIOR LEVEL OF FUNCTION: Independent  PATIENT GOALS: not sure.     OBJECTIVE:  COGNITION: Overall cognitive status: Within functional limits for tasks assessed   UPPER EXTREMITY AROM: Shoulder  UPPER EXTREMITY ROM: AROM full and equal   Observations: Well healed incision, tissue hanging over lateral edge from tightness and skin superior to incision also hangs over a bit, seems like skin vs edema.  Pt notes no fluctuation.  Not yet red from radiation.   Palpation: no pitting, hardened scar tissue where seroma was removed inferior lateral incision. Not tender yet from radiation.   LYMPHEDEMA ASSESSMENTS: PT is Rt handed   LANDMARK RIGHT   Eval last location  10 cm proximal to olecranon process 35.2  Olecranon process 28  10 cm proximal  to ulnar styloid process 21.7  Just proximal to ulnar styloid process 16.3  Across hand at thumb web space 19.9  LANDMARK LEFT   Eval last location  10 cm proximal to olecranon process 39  Olecranon process 28  10 cm proximal to ulnar styloid process 21.5  Just proximal to ulnar styloid process 16.3  Across hand at thumb web space 19.5  At base of 2nd digit 6.8  (Blank rows = not tested)  BREAST COMPLAINTS QUESTIONNAIRE (answered for chest wall, no breast) Pain:  8 Heaviness: 0 Swollen feeling:8  Tense Skin: 8 Redness: 0 Bra Print: 10 Size of Pores: 0 Hard feeling:  10 Total:    44 /80 A Score over 9 indicates lymphedema issues in the breast  TODAY'S TREATMENT:                                                                                                                              DATE:   05/14/23: Manual Therapy P/ROM to Lt shoulder into flex, abd and D2 STM no lotion to Lt pect tendon in supine during P/ROM MLD abbreviated to chest region only post TE and STM Therapeutic Exercises Pulleys into flex and abd x 2 mins each Roll yellow ball up wall into flex x 10 also returning therapist demo Yellow row x 12 at treadmill Supine chest stretch behind the head x 60" Alternating flexion x 10 bil   05/08/23: Manual Therapy P/ROM to Lt shoulder into flex, abd and D2 with Lt scapular depression throughout  STM to Lt pect tendon in supine during P/ROM MFR gently to Lt chest wall  Therapeutic Exercises Pulleys into flex and abd x 2 mins each returning therapist demo and VC's during to decrease scapular compensation Roll yellow ball up wall into flex x 10 also returning therapist demo  05/03/23 Eval performed Performed scar massage education and performance in supine without lotion as pt has radiation after this.    Direct Scar massage: after scar is healed, no opening, no scab  Place pads of two fingers together directly on the scar starting at one end of the scar.  Move the fingers up and down across the scar holding 5 seconds one direction.  Then go opposite direction hold 5 seconds.  Move over to the next section of the scar and repeat.  Work your way along the entire length of the scar.   Next make diagonal movements along the scar holding 5 seconds at one direction. Next movement is side to side. Do not rub fingers over the scar.  Instead keep firm pressure and move scar over the tissue it is on top  PATIENT EDUCATION: Per today's note  Education details: Person educated: Patient Education method: Explanation Education comprehension: verbalized understanding, returned demonstration, verbal cues required, and tactile cues required  HOME EXERCISE PROGRAM: Self scar massage   ASSESSMENT:  CLINICAL IMPRESSION: Focused more on AA/ROM today due to having radiation right after PT.  Overall getting more red but pt reports no pain.   OBJECTIVE IMPAIRMENTS: increased edema.   ACTIVITY LIMITATIONS: none  REHAB POTENTIAL: Good  CLINICAL DECISION MAKING: Stable/uncomplicated  EVALUATION COMPLEXITY: Low  GOALS: Goals reviewed with patient? No  LONG TERM GOALS: Target date: 06/28/23  PT will be ind with scar tissue self care including massage, silicone tape, for home management  Baseline: Goal status: new  2.  Pt will decrease breast complaints questionnaire to <30 to show improved chest wall status Baseline:  Goal status: new  PLAN:  PT FREQUENCY: 1x/week  PT DURATION: 4 weeks and then break x 3 weeks and then recheck  PLANNED INTERVENTIONS: Therapeutic exercises, Self Care, , Manual lymph drainage,  manual therapy,   PLAN FOR NEXT SESSION: Add supine scapular series with yellow theraband but instruct pt to only perform this a few times a week and while skin remains intact; STM/MFR, scar tissue release Lt chest wall with focus on inferior lateral to incision. Break for radiation as needed.   Roslynn Coombes, PTA 05/14/23 8:55  AM   PHYSICAL THERAPY DISCHARGE SUMMARY  Visits from Start of Care: 3  Current functional level related to goals / functional outcomes: Did not return after last visit.     Plan: Patient agrees to discharge.  Patient goals were not met.

## 2023-05-15 ENCOUNTER — Other Ambulatory Visit: Payer: Self-pay

## 2023-05-15 ENCOUNTER — Ambulatory Visit
Admission: RE | Admit: 2023-05-15 | Discharge: 2023-05-15 | Disposition: A | Discharge status: Home / Self Care | Payer: Managed Care, Other (non HMO) | Source: Ambulatory Visit | Attending: Radiation Oncology | Admitting: Radiation Oncology

## 2023-05-15 DIAGNOSIS — Z510 Encounter for antineoplastic radiation therapy: Secondary | ICD-10-CM | POA: Diagnosis not present

## 2023-05-15 LAB — RAD ONC ARIA SESSION SUMMARY
Course Elapsed Days: 27
Plan Fractions Treated to Date: 10
Plan Fractions Treated to Date: 20
Plan Prescribed Dose Per Fraction: 1.8 Gy
Plan Prescribed Dose Per Fraction: 1.8 Gy
Plan Total Fractions Prescribed: 14
Plan Total Fractions Prescribed: 28
Plan Total Prescribed Dose: 25.2 Gy
Plan Total Prescribed Dose: 50.4 Gy
Reference Point Dosage Given to Date: 18 Gy
Reference Point Dosage Given to Date: 36 Gy
Reference Point Session Dosage Given: 1.8 Gy
Reference Point Session Dosage Given: 1.8 Gy
Session Number: 20

## 2023-05-16 ENCOUNTER — Ambulatory Visit
Admission: RE | Admit: 2023-05-16 | Discharge: 2023-05-16 | Disposition: A | Discharge status: Home / Self Care | Payer: Managed Care, Other (non HMO) | Source: Ambulatory Visit | Attending: Radiation Oncology

## 2023-05-16 ENCOUNTER — Other Ambulatory Visit: Payer: Self-pay

## 2023-05-16 DIAGNOSIS — Z510 Encounter for antineoplastic radiation therapy: Secondary | ICD-10-CM | POA: Diagnosis not present

## 2023-05-16 LAB — RAD ONC ARIA SESSION SUMMARY
Course Elapsed Days: 28
Plan Fractions Treated to Date: 11
Plan Fractions Treated to Date: 21
Plan Prescribed Dose Per Fraction: 1.8 Gy
Plan Prescribed Dose Per Fraction: 1.8 Gy
Plan Total Fractions Prescribed: 14
Plan Total Fractions Prescribed: 28
Plan Total Prescribed Dose: 25.2 Gy
Plan Total Prescribed Dose: 50.4 Gy
Reference Point Dosage Given to Date: 19.8 Gy
Reference Point Dosage Given to Date: 37.8 Gy
Reference Point Session Dosage Given: 1.8 Gy
Reference Point Session Dosage Given: 1.8 Gy
Session Number: 21

## 2023-05-17 ENCOUNTER — Ambulatory Visit
Admission: RE | Admit: 2023-05-17 | Discharge: 2023-05-17 | Disposition: A | Discharge status: Home / Self Care | Payer: Managed Care, Other (non HMO) | Source: Ambulatory Visit | Attending: Radiation Oncology | Admitting: Radiation Oncology

## 2023-05-17 ENCOUNTER — Other Ambulatory Visit: Payer: Self-pay

## 2023-05-17 DIAGNOSIS — Z510 Encounter for antineoplastic radiation therapy: Secondary | ICD-10-CM | POA: Diagnosis not present

## 2023-05-17 LAB — RAD ONC ARIA SESSION SUMMARY
Course Elapsed Days: 29
Plan Fractions Treated to Date: 11
Plan Fractions Treated to Date: 22
Plan Prescribed Dose Per Fraction: 1.8 Gy
Plan Prescribed Dose Per Fraction: 1.8 Gy
Plan Total Fractions Prescribed: 14
Plan Total Fractions Prescribed: 28
Plan Total Prescribed Dose: 25.2 Gy
Plan Total Prescribed Dose: 50.4 Gy
Reference Point Dosage Given to Date: 19.8 Gy
Reference Point Dosage Given to Date: 39.6 Gy
Reference Point Session Dosage Given: 1.8 Gy
Reference Point Session Dosage Given: 1.8 Gy
Session Number: 22

## 2023-05-18 ENCOUNTER — Ambulatory Visit
Admission: RE | Admit: 2023-05-18 | Discharge: 2023-05-18 | Disposition: A | Discharge status: Home / Self Care | Payer: Managed Care, Other (non HMO) | Source: Ambulatory Visit | Attending: Radiation Oncology

## 2023-05-18 ENCOUNTER — Other Ambulatory Visit: Payer: Self-pay

## 2023-05-18 ENCOUNTER — Ambulatory Visit: Payer: Managed Care, Other (non HMO) | Admitting: Radiation Oncology

## 2023-05-18 DIAGNOSIS — Z510 Encounter for antineoplastic radiation therapy: Secondary | ICD-10-CM | POA: Diagnosis not present

## 2023-05-18 LAB — RAD ONC ARIA SESSION SUMMARY
Course Elapsed Days: 30
Plan Fractions Treated to Date: 12
Plan Fractions Treated to Date: 23
Plan Prescribed Dose Per Fraction: 1.8 Gy
Plan Prescribed Dose Per Fraction: 1.8 Gy
Plan Total Fractions Prescribed: 14
Plan Total Fractions Prescribed: 28
Plan Total Prescribed Dose: 25.2 Gy
Plan Total Prescribed Dose: 50.4 Gy
Reference Point Dosage Given to Date: 21.6 Gy
Reference Point Dosage Given to Date: 41.4 Gy
Reference Point Session Dosage Given: 1.8 Gy
Reference Point Session Dosage Given: 1.8 Gy
Session Number: 23

## 2023-05-21 ENCOUNTER — Other Ambulatory Visit: Payer: Self-pay

## 2023-05-21 ENCOUNTER — Ambulatory Visit
Admission: RE | Admit: 2023-05-21 | Discharge: 2023-05-21 | Disposition: A | Discharge status: Home / Self Care | Payer: Managed Care, Other (non HMO) | Source: Ambulatory Visit | Attending: Radiation Oncology | Admitting: Radiation Oncology

## 2023-05-21 DIAGNOSIS — Z510 Encounter for antineoplastic radiation therapy: Secondary | ICD-10-CM | POA: Diagnosis not present

## 2023-05-21 LAB — RAD ONC ARIA SESSION SUMMARY
Course Elapsed Days: 33
Plan Fractions Treated to Date: 12
Plan Fractions Treated to Date: 24
Plan Prescribed Dose Per Fraction: 1.8 Gy
Plan Prescribed Dose Per Fraction: 1.8 Gy
Plan Total Fractions Prescribed: 14
Plan Total Fractions Prescribed: 28
Plan Total Prescribed Dose: 25.2 Gy
Plan Total Prescribed Dose: 50.4 Gy
Reference Point Dosage Given to Date: 21.6 Gy
Reference Point Dosage Given to Date: 43.2 Gy
Reference Point Session Dosage Given: 1.8 Gy
Reference Point Session Dosage Given: 1.8 Gy
Session Number: 24

## 2023-05-22 ENCOUNTER — Ambulatory Visit
Admission: RE | Admit: 2023-05-22 | Discharge: 2023-05-22 | Disposition: A | Discharge status: Home / Self Care | Payer: Managed Care, Other (non HMO) | Source: Ambulatory Visit | Attending: Radiation Oncology | Admitting: Radiation Oncology

## 2023-05-22 ENCOUNTER — Other Ambulatory Visit: Payer: Self-pay | Admitting: Radiation Oncology

## 2023-05-22 ENCOUNTER — Encounter: Payer: Self-pay | Admitting: Radiation Oncology

## 2023-05-22 ENCOUNTER — Telehealth: Payer: Self-pay | Admitting: Rehabilitation

## 2023-05-22 ENCOUNTER — Other Ambulatory Visit: Payer: Self-pay

## 2023-05-22 ENCOUNTER — Ambulatory Visit: Payer: Managed Care, Other (non HMO) | Admitting: Rehabilitation

## 2023-05-22 DIAGNOSIS — Z510 Encounter for antineoplastic radiation therapy: Secondary | ICD-10-CM | POA: Diagnosis not present

## 2023-05-22 LAB — RAD ONC ARIA SESSION SUMMARY
Course Elapsed Days: 34
Plan Fractions Treated to Date: 13
Plan Fractions Treated to Date: 25
Plan Prescribed Dose Per Fraction: 1.8 Gy
Plan Prescribed Dose Per Fraction: 1.8 Gy
Plan Total Fractions Prescribed: 14
Plan Total Fractions Prescribed: 28
Plan Total Prescribed Dose: 25.2 Gy
Plan Total Prescribed Dose: 50.4 Gy
Reference Point Dosage Given to Date: 23.4 Gy
Reference Point Dosage Given to Date: 45 Gy
Reference Point Session Dosage Given: 1.8 Gy
Reference Point Session Dosage Given: 1.8 Gy
Session Number: 25

## 2023-05-22 MED ORDER — OXYCODONE HCL 5 MG PO TABS: 5.0000 mg | ORAL_TABLET | ORAL | 0 refills | Status: DC | PRN

## 2023-05-22 NOTE — Telephone Encounter (Signed)
Returned phone message regarding taking a break from PT due to radiation desquamation.  Cancelled pts remaining appointments and will keep 07/03/23 to check in after radiation.  Discussed keeping up with gentle AROM and stretches but nothing vigorous.

## 2023-05-22 NOTE — Progress Notes (Signed)
Ms. Kraatz is a pleasant 47 y.o.  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 She has been receiving adjuvant radiation to the left chest wall, and regional nodes. She's completed 24 of the planned 33 fractions to date, current dose totaling 43.2 Gy. She called this morning out of concern for her skin changes, and is seen today to discuss next steps for management.  On review of systems, the patient states she's been using radioplex and describes progressive pain in her left axilla in the last week. She has tried neosporin with pain relief without results.    In general this is a well appearing caucasian female in no acute distress. She's alert and oriented x4 and appropriate throughout the examination. Cardiopulmonary assessment is negative for acute distress and she exhibits normal effort. Her left chest wall has brisk erythema along the chest wall, lower axilla and upper supraclavicular site without any frank desquamation, though in the fold of the lower axilla, there is more hyperpgimentation that appears suspicious for early dry desquamation though the amount is about 2-3 mm in thickness.   Impression/Plan: 1.  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  The patient was counseled on the rationale to continue radiotherapy, and to try to symptomatically manage her symptoms. We will change her cream to sonafine, and have given silvadene in case she develops skin breakdown over the weekend, and that she can use silvadene in the axillary fold site. She can also continue neosporin with pain relief if she likes, ibuprofen during the day, and we discussed dermoplast topical spray, and oxycodone 5 mg prn for pain especially at night. She was given sonafine and silvadene and telfa dressings. She will see Dr. Kathrynn Running for PUT visit tomorrow.      Osker Mason, PAC

## 2023-05-23 ENCOUNTER — Ambulatory Visit: Payer: Managed Care, Other (non HMO) | Admitting: Radiation Oncology

## 2023-05-23 ENCOUNTER — Other Ambulatory Visit: Payer: Self-pay

## 2023-05-23 ENCOUNTER — Ambulatory Visit
Admission: RE | Admit: 2023-05-23 | Discharge: 2023-05-23 | Disposition: A | Discharge status: Home / Self Care | Payer: Managed Care, Other (non HMO) | Source: Ambulatory Visit | Attending: Radiation Oncology | Admitting: Radiation Oncology

## 2023-05-23 DIAGNOSIS — Z510 Encounter for antineoplastic radiation therapy: Secondary | ICD-10-CM | POA: Diagnosis not present

## 2023-05-23 LAB — RAD ONC ARIA SESSION SUMMARY
Course Elapsed Days: 35
Plan Fractions Treated to Date: 13
Plan Fractions Treated to Date: 26
Plan Prescribed Dose Per Fraction: 1.8 Gy
Plan Prescribed Dose Per Fraction: 1.8 Gy
Plan Total Fractions Prescribed: 14
Plan Total Fractions Prescribed: 28
Plan Total Prescribed Dose: 25.2 Gy
Plan Total Prescribed Dose: 50.4 Gy
Reference Point Dosage Given to Date: 23.4 Gy
Reference Point Dosage Given to Date: 46.8 Gy
Reference Point Session Dosage Given: 1.8 Gy
Reference Point Session Dosage Given: 1.8 Gy
Session Number: 26

## 2023-05-28 ENCOUNTER — Ambulatory Visit
Admission: RE | Admit: 2023-05-28 | Discharge: 2023-05-28 | Disposition: A | Discharge status: Home / Self Care | Payer: Managed Care, Other (non HMO) | Source: Ambulatory Visit | Attending: Radiation Oncology | Admitting: Radiation Oncology

## 2023-05-28 ENCOUNTER — Other Ambulatory Visit: Payer: Self-pay

## 2023-05-28 ENCOUNTER — Ambulatory Visit: Payer: Managed Care, Other (non HMO) | Admitting: Radiation Oncology

## 2023-05-28 DIAGNOSIS — C50212 Malignant neoplasm of upper-inner quadrant of left female breast: Secondary | ICD-10-CM | POA: Diagnosis present

## 2023-05-28 DIAGNOSIS — Z510 Encounter for antineoplastic radiation therapy: Secondary | ICD-10-CM | POA: Diagnosis present

## 2023-05-28 LAB — RAD ONC ARIA SESSION SUMMARY
Course Elapsed Days: 40
Plan Fractions Treated to Date: 14
Plan Fractions Treated to Date: 27
Plan Prescribed Dose Per Fraction: 1.8 Gy
Plan Prescribed Dose Per Fraction: 1.8 Gy
Plan Total Fractions Prescribed: 14
Plan Total Fractions Prescribed: 28
Plan Total Prescribed Dose: 25.2 Gy
Plan Total Prescribed Dose: 50.4 Gy
Reference Point Dosage Given to Date: 25.2 Gy
Reference Point Dosage Given to Date: 48.6 Gy
Reference Point Session Dosage Given: 1.8 Gy
Reference Point Session Dosage Given: 1.8 Gy
Session Number: 27

## 2023-05-29 ENCOUNTER — Other Ambulatory Visit: Payer: Self-pay

## 2023-05-29 ENCOUNTER — Ambulatory Visit
Admission: RE | Admit: 2023-05-29 | Discharge: 2023-05-29 | Disposition: A | Discharge status: Home / Self Care | Payer: Managed Care, Other (non HMO) | Source: Ambulatory Visit | Attending: Radiation Oncology

## 2023-05-29 ENCOUNTER — Encounter: Payer: Managed Care, Other (non HMO) | Admitting: Rehabilitation

## 2023-05-29 DIAGNOSIS — Z510 Encounter for antineoplastic radiation therapy: Secondary | ICD-10-CM | POA: Diagnosis not present

## 2023-05-29 LAB — RAD ONC ARIA SESSION SUMMARY
Course Elapsed Days: 41
Plan Fractions Treated to Date: 14
Plan Fractions Treated to Date: 28
Plan Prescribed Dose Per Fraction: 1.8 Gy
Plan Prescribed Dose Per Fraction: 1.8 Gy
Plan Total Fractions Prescribed: 14
Plan Total Fractions Prescribed: 28
Plan Total Prescribed Dose: 25.2 Gy
Plan Total Prescribed Dose: 50.4 Gy
Reference Point Dosage Given to Date: 25.2 Gy
Reference Point Dosage Given to Date: 50.4 Gy
Reference Point Session Dosage Given: 1.8 Gy
Reference Point Session Dosage Given: 1.8 Gy
Session Number: 28

## 2023-05-30 ENCOUNTER — Ambulatory Visit
Admission: RE | Admit: 2023-05-30 | Discharge: 2023-05-30 | Disposition: A | Discharge status: Home / Self Care | Payer: Managed Care, Other (non HMO) | Source: Ambulatory Visit | Attending: Radiation Oncology

## 2023-05-30 ENCOUNTER — Other Ambulatory Visit: Payer: Self-pay

## 2023-05-30 ENCOUNTER — Ambulatory Visit: Payer: Managed Care, Other (non HMO) | Admitting: Radiation Oncology

## 2023-05-30 DIAGNOSIS — Z510 Encounter for antineoplastic radiation therapy: Secondary | ICD-10-CM | POA: Diagnosis not present

## 2023-05-30 LAB — RAD ONC ARIA SESSION SUMMARY
Course Elapsed Days: 42
Plan Fractions Treated to Date: 1
Plan Prescribed Dose Per Fraction: 2 Gy
Plan Total Fractions Prescribed: 5
Plan Total Prescribed Dose: 10 Gy
Reference Point Dosage Given to Date: 2 Gy
Reference Point Session Dosage Given: 2 Gy
Session Number: 29

## 2023-05-31 ENCOUNTER — Ambulatory Visit
Admission: RE | Admit: 2023-05-31 | Discharge: 2023-05-31 | Disposition: A | Discharge status: Home / Self Care | Payer: Managed Care, Other (non HMO) | Source: Ambulatory Visit | Attending: Radiation Oncology | Admitting: Radiation Oncology

## 2023-05-31 ENCOUNTER — Other Ambulatory Visit: Payer: Self-pay

## 2023-05-31 DIAGNOSIS — Z510 Encounter for antineoplastic radiation therapy: Secondary | ICD-10-CM | POA: Diagnosis not present

## 2023-05-31 LAB — RAD ONC ARIA SESSION SUMMARY
Course Elapsed Days: 43
Plan Fractions Treated to Date: 2
Plan Prescribed Dose Per Fraction: 2 Gy
Plan Total Fractions Prescribed: 5
Plan Total Prescribed Dose: 10 Gy
Reference Point Dosage Given to Date: 4 Gy
Reference Point Session Dosage Given: 2 Gy
Session Number: 30

## 2023-06-01 ENCOUNTER — Ambulatory Visit
Admission: RE | Admit: 2023-06-01 | Discharge: 2023-06-01 | Disposition: A | Discharge status: Home / Self Care | Payer: Managed Care, Other (non HMO) | Source: Ambulatory Visit | Attending: Radiation Oncology | Admitting: Radiation Oncology

## 2023-06-01 ENCOUNTER — Ambulatory Visit
Admission: RE | Admit: 2023-06-01 | Discharge: 2023-06-01 | Disposition: A | Discharge status: Home / Self Care | Payer: Managed Care, Other (non HMO) | Source: Ambulatory Visit | Attending: Radiation Oncology

## 2023-06-01 ENCOUNTER — Other Ambulatory Visit: Payer: Self-pay

## 2023-06-01 DIAGNOSIS — Z510 Encounter for antineoplastic radiation therapy: Secondary | ICD-10-CM | POA: Diagnosis not present

## 2023-06-01 LAB — RAD ONC ARIA SESSION SUMMARY
Course Elapsed Days: 44
Plan Fractions Treated to Date: 3
Plan Prescribed Dose Per Fraction: 2 Gy
Plan Total Fractions Prescribed: 5
Plan Total Prescribed Dose: 10 Gy
Reference Point Dosage Given to Date: 6 Gy
Reference Point Session Dosage Given: 2 Gy
Session Number: 31

## 2023-06-04 ENCOUNTER — Ambulatory Visit: Payer: Managed Care, Other (non HMO)

## 2023-06-05 ENCOUNTER — Ambulatory Visit: Payer: Managed Care, Other (non HMO)

## 2023-06-05 ENCOUNTER — Encounter: Payer: Managed Care, Other (non HMO) | Admitting: Rehabilitation

## 2023-06-06 ENCOUNTER — Ambulatory Visit: Payer: Managed Care, Other (non HMO)

## 2023-06-06 NOTE — Radiation Completion Notes (Addendum)
  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   Intent: Curative     ==========DELIVERED PLANS==========  First Treatment Date: 2023-04-18 Last Treatment Date: 2023-06-01   Plan Name: CW_L_BH_BO Site: Chest Wall, Left Technique: 3D Mode: Photon Dose Per Fraction: 1.8 Gy Prescribed Dose (Delivered / Prescribed): 25.2 Gy / 25.2 Gy Prescribed Fxs (Delivered / Prescribed): 14 / 14   Plan Name: CW_L_SCV_BH Site: Chest Wall, Left Technique: 3D Mode: Photon Dose Per Fraction: 1.8 Gy Prescribed Dose (Delivered / Prescribed): 50.4 Gy / 50.4 Gy Prescribed Fxs (Delivered / Prescribed): 28 / 28   Plan Name: CW_L_Bst_BO Site: Chest Wall, Left Technique: Electron Mode: Electron Dose Per Fraction: 2 Gy Prescribed Dose (Delivered / Prescribed): 6 Gy / 10 Gy Prescribed Fxs (Delivered / Prescribed): 3 / 5   Plan Name: CW_L_BH Site: Chest Wall, Left Technique: 3D Mode: Photon Dose Per Fraction: 1.8 Gy Prescribed Dose (Delivered / Prescribed): 25.2 Gy / 25.2 Gy Prescribed Fxs (Delivered / Prescribed): 14 / 14     ==========ON TREATMENT VISIT DATES========== 2023-04-20, 2023-04-27, 2023-05-04, 2023-05-11, 2023-05-18, 2023-05-28, 2023-06-01   See weekly On Treatment Notes in Epic for details in the Media tab (listed as Progress notes on the On Treatment Visit Dates listed above). The patient tolerated radiation. She developed fatigue and anticipated skin changes in the treatment field. She did have some evidence of moist desquamation in the supraclavicular region during her last under treatment visit, however she decided to forgo the last two fractions of radiation due to persistent skin changes.  The patient will receive a call in about one month  from the radiation oncology department. She will continue follow up with Dr. Ellin Saba as well.      Osker Mason, PAC

## 2023-06-07 ENCOUNTER — Ambulatory Visit: Payer: Managed Care, Other (non HMO)

## 2023-06-11 ENCOUNTER — Encounter: Payer: Managed Care, Other (non HMO) | Attending: Internal Medicine | Admitting: Nutrition

## 2023-06-11 VITALS — Ht 67.0 in | Wt 266.0 lb

## 2023-06-11 DIAGNOSIS — K760 Fatty (change of) liver, not elsewhere classified: Secondary | ICD-10-CM | POA: Insufficient documentation

## 2023-06-11 DIAGNOSIS — E782 Mixed hyperlipidemia: Secondary | ICD-10-CM | POA: Insufficient documentation

## 2023-06-11 DIAGNOSIS — R7303 Prediabetes: Secondary | ICD-10-CM | POA: Insufficient documentation

## 2023-06-11 NOTE — Progress Notes (Unsigned)
Medical Nutrition Therapy  Appointment Start time:  1330  Appointment End time:  1430  Primary concerns today: Fatty Liver, Obesity,  Referral diagnosis: K76.0, E66.01 Preferred learning style: No preference  Learning readiness: Ready    NUTRITION ASSESSMENT  47 yr old wfemale referred for Fatty Liver and Obesity. PCP Dr. Durwin Nora. She has gone through chemo and radiation treatment for breast cancer. Is on medication for depression, Pre DM, anxiety. Has Hyperlipidemia, obesity, Vit D Deficiency, sleep disorder and tobacco use. Diet is inconsistent to meet her nutritional needs. It is high in fat, sodium and processed foods. Diet is low in fruits, vegetables and whole grains.  She is willing to work with Lifestyle Medicine with focusing on more of a whole plant predominant food intake and the 6 pillars of health  to improve her health, reverse her fatty live and work on weight loss.  Clinical Lab Results  Component Value Date   HGBA1C 6.0 (H) 07/03/2022      Latest Ref Rng & Units 04/05/2023   10:03 AM 03/15/2023    9:38 AM 03/08/2023    9:25 AM  CMP  Glucose 70 - 99 mg/dL 97  161  096   BUN 6 - 20 mg/dL 11  10  11    Creatinine 0.44 - 1.00 mg/dL 0.45  4.09  8.11   Sodium 135 - 145 mmol/L 136  141  138   Potassium 3.5 - 5.1 mmol/L 3.7  3.8  3.7   Chloride 98 - 111 mmol/L 102  106  100   CO2 22 - 32 mmol/L 25  23  26    Calcium 8.9 - 10.3 mg/dL 8.9  9.2  8.8   Total Protein 6.5 - 8.1 g/dL 6.9  6.8  6.7   Total Bilirubin 0.3 - 1.2 mg/dL 0.5  0.8  0.5   Alkaline Phos 38 - 126 U/L 80  69  75   AST 15 - 41 U/L 43  58  42   ALT 0 - 44 U/L 74  107  85    Lipid Panel     Component Value Date/Time   CHOL 254 (H) 07/03/2022 1356   TRIG 308 (H) 07/03/2022 1356   HDL 33 (L) 07/03/2022 1356   CHOLHDL 7.7 (H) 07/03/2022 1356   CHOLHDL 5.9 (H) 11/11/2019 1611   LDLCALC 163 (H) 07/03/2022 1356   LDLCALC 152 (H) 11/11/2019 1611   LABVLDL 58 (H) 07/03/2022 1356    Medical Hx:  Past  Medical History:  Diagnosis Date   Allergy    seasonal   Antiphospholipid syndrome (HCC)    Anxiety    Arthritis    deterioration of spine, knees   Carpal tunnel syndrome of right wrist    Clotting disorder (HCC)    antiphospholipid syndrome   Depression    Encounter for general adult medical examination with abnormal findings 07/03/2022   Essential hypertension, benign 02/10/2020   Hyperlipidemia 08/02/2022   Sleep disorder 02/22/2016    Medications:  Current Outpatient Medications on File Prior to Visit  Medication Sig Dispense Refill   Cholecalciferol (VITAMIN D-3) 25 MCG (1000 UT) CAPS Take 1 capsule by mouth daily.     escitalopram (LEXAPRO) 10 MG tablet Take 1 tablet (10 mg total) by mouth daily. 90 tablet 1   loratadine (CLARITIN) 10 MG tablet Take 10 mg by mouth daily.     losartan (COZAAR) 25 MG tablet Take 1 tablet (25 mg total) by mouth daily. 90 tablet 1  oxyCODONE (OXY IR/ROXICODONE) 5 MG immediate release tablet Take 1 tablet (5 mg total) by mouth every 4 (four) hours as needed for severe pain (pain score 7-10). 60 tablet 0   tamoxifen (NOLVADEX) 20 MG tablet Take 1 tablet (20 mg total) by mouth daily. 90 tablet 3   acetaminophen (TYLENOL) 500 MG tablet Take 1,000 mg by mouth every 6 (six) hours as needed for headache. (Patient not taking: Reported on 04/05/2023)     hydrOXYzine (VISTARIL) 50 MG capsule Take 1 capsule (50 mg total) by mouth daily as needed for anxiety. (Patient not taking: Reported on 06/11/2023) 30 capsule 2   lidocaine-prilocaine (EMLA) cream Apply to affected area once (Patient not taking: Reported on 04/05/2023) 30 g 3   ondansetron (ZOFRAN-ODT) 4 MG disintegrating tablet Take 1 tablet (4 mg total) by mouth every 6 (six) hours as needed for nausea. (Patient not taking: Reported on 04/05/2023) 20 tablet 0   prochlorperazine (COMPAZINE) 10 MG tablet TAKE ONE TABLET BY MOUTH EVERY 6 HOURS AS NEEDED FOR NAUSEA AND VOMITING (Patient not taking: Reported  on 04/05/2023) 60 tablet 3   No current facility-administered medications on file prior to visit.    Labs:  Past Medical History:  Diagnosis Date   Allergy    seasonal   Antiphospholipid syndrome (HCC)    Anxiety    Arthritis    deterioration of spine, knees   Carpal tunnel syndrome of right wrist    Clotting disorder (HCC)    antiphospholipid syndrome   Depression    Encounter for general adult medical examination with abnormal findings 07/03/2022   Essential hypertension, benign 02/10/2020   Hyperlipidemia 08/02/2022   Sleep disorder 02/22/2016    Notable Signs/Symptoms: None  Lifestyle & Dietary Hx :LIves with husband and 2 kids.  Estimated daily fluid intake: 40 oz Supplements: VIt D supplement Sleep: poor Stress / self-care:  Cancer tx Current average weekly physical activity: Working on starting to be more active  24-Hr Dietary Recall First [id number] 2 biscuits with cheese and bacon, Orange Juice, coffee Snack:  Second Meal: skipped Snack:  Third Meal: Baked chicken, fried potatoes and corn bread, Sweet tea 16 oz Snack: water, coffee Beverages: water  Estimated Energy Needs Calories: 1500 Carbohydrate: 170g Protein: 112g Fat: 42g   NUTRITION DIAGNOSIS  NI-1.7 Predicted excessive energy intake As related to INconsistent food intake with processed foods.  As evidenced by BMI 41, and Fatty liver.   NUTRITION INTERVENTION  Nutrition education (E-1) on the following topics:  Lifestyle Medicine  - Whole Food, Plant Predominant Nutrition is highly recommended: Eat Plenty of vegetables, Mushrooms, fruits, Legumes, Whole Grains, Nuts, seeds in lieu of processed meats, processed snacks/pastries red meat, poultry, eggs.    -It is better to avoid simple carbohydrates including: Cakes, Sweet Desserts, Ice Cream, Soda (diet and regular), Sweet Tea, Candies, Chips, Cookies, Store Bought Juices, Alcohol in Excess of  1-2 drinks a day, Lemonade,  Artificial  Sweeteners, Doughnuts, Coffee Creamers, "Sugar-free" Products, etc, etc.  This is not a complete list.....  Exercise: If you are able: 30 -60 minutes a day ,4 days a week, or 150 minutes a week.  The longer the better.  Combine stretch, strength, and aerobic activities.  If you were told in the past that you have high risk for cardiovascular diseases, you may seek evaluation by your heart doctor prior to initiating moderate to intense exercise programs. Nutrition for Cancer recovery- importance of protein, fruits, vegetables and whole grains for tissue repair and  a diet high in antioxidants to help keep cancer cells in remission and reverse disease.  Handouts Provided Include  Lifestyle Medicine handouts.  Learning Style & Readiness for Change Teaching method utilized: Visual & Auditory  Demonstrated degree of understanding via: Teach Back  Barriers to learning/adherence to lifestyle change: depression and insuffient motivation  Goals Established by Pt Goals  Increase fruits to 3 servings per day and 3 servings of vegetables per day Do the [facility] living program Increase exercise to 30 minutes 4 days per week. Cut down on biscuit and cheese Bake and broil foods and cut out fried foods Decrease coffee to 2 cups per day; use 1 serving as decaf coffee. Increase water intake to 5 days per week.   MONITORING & EVALUATION Dietary intake, weekly physical activity, and weight in 1 month..  Next Steps  Patient is to work on meal planning and meal prepping.Marland Kitchen

## 2023-06-11 NOTE — Patient Instructions (Signed)
Goals  Increase fruits to 3 servings per day and 3 servings of vegetables per day Do the [facility] living program Increase exercise to 30 minutes 4 days per week. Cut down on biscuit and cheese Bake and broil foods and cut out fried foods Decrease coffee to 2 cups per day; use 1 serving as decaf coffee. Increase water intake to 5 days per week.

## 2023-06-12 ENCOUNTER — Telehealth: Payer: Self-pay | Admitting: *Deleted

## 2023-06-12 NOTE — Telephone Encounter (Signed)
Spoke with the patient to see how she is doing since completion of her radiation treatments last week.  She reports continued redness, peeling, itching, and tenderness within the radiation field.  Reports the blisters have resolved.  She is using radiaplex BID, neosporin with pain relief, and hydrocortisone cream.  She reports overall her skin is starting to improve.  She was advised to continue her creams and over the next couple of weeks the skin will continue to gradually get better.  She was encouraged to give [place] a call if she should notice worsening to her skin.  She was informed we would give her a call in about 3-4 weeks to check in.  She verbalized understanding and was appreciative of the call.  Lind Covert RN, BSN

## 2023-06-13 ENCOUNTER — Ambulatory Visit: Payer: Managed Care, Other (non HMO) | Admitting: Internal Medicine

## 2023-06-14 ENCOUNTER — Encounter: Payer: Self-pay | Admitting: Nutrition

## 2023-07-02 ENCOUNTER — Ambulatory Visit
Admission: RE | Admit: 2023-07-02 | Discharge: 2023-07-02 | Disposition: A | Discharge status: Home / Self Care | Payer: Managed Care, Other (non HMO) | Source: Ambulatory Visit | Attending: Radiation Oncology | Admitting: Radiation Oncology

## 2023-07-02 DIAGNOSIS — Z510 Encounter for antineoplastic radiation therapy: Secondary | ICD-10-CM | POA: Insufficient documentation

## 2023-07-02 DIAGNOSIS — C50212 Malignant neoplasm of upper-inner quadrant of left female breast: Secondary | ICD-10-CM | POA: Insufficient documentation

## 2023-07-02 NOTE — Progress Notes (Signed)
  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 (as documented in provider EOT note)  The patient was available for call today.   Symptoms of fatigue have improved since completing therapy.  Symptoms of skin changes have improved since completing therapy.  The patient was encouraged to avoid sun exposure in the area of prior treatment for up to one year following radiation with either sunscreen or by the style of clothing worn in the sun.  The patient has scheduled follow up with her medical oncologist Dr. Katragadda for ongoing surveillance, and was encouraged to call if she develops concerns or questions regarding radiation.   This concludes the interaction.  Rosaline Minerva, LPN

## 2023-07-03 ENCOUNTER — Encounter: Payer: Managed Care, Other (non HMO) | Admitting: Rehabilitation

## 2023-07-19 ENCOUNTER — Other Ambulatory Visit: Payer: Self-pay | Admitting: Internal Medicine

## 2023-07-19 DIAGNOSIS — F419 Anxiety disorder, unspecified: Secondary | ICD-10-CM

## 2023-07-30 ENCOUNTER — Inpatient Hospital Stay: Payer: Managed Care, Other (non HMO) | Attending: Hematology

## 2023-07-30 DIAGNOSIS — E559 Vitamin D deficiency, unspecified: Secondary | ICD-10-CM | POA: Diagnosis not present

## 2023-07-30 DIAGNOSIS — Z9012 Acquired absence of left breast and nipple: Secondary | ICD-10-CM | POA: Insufficient documentation

## 2023-07-30 DIAGNOSIS — G629 Polyneuropathy, unspecified: Secondary | ICD-10-CM | POA: Diagnosis not present

## 2023-07-30 DIAGNOSIS — Z803 Family history of malignant neoplasm of breast: Secondary | ICD-10-CM | POA: Diagnosis not present

## 2023-07-30 DIAGNOSIS — F17210 Nicotine dependence, cigarettes, uncomplicated: Secondary | ICD-10-CM | POA: Insufficient documentation

## 2023-07-30 DIAGNOSIS — Z800 Family history of malignant neoplasm of digestive organs: Secondary | ICD-10-CM | POA: Diagnosis not present

## 2023-07-30 DIAGNOSIS — C50112 Malignant neoplasm of central portion of left female breast: Secondary | ICD-10-CM | POA: Insufficient documentation

## 2023-07-30 DIAGNOSIS — Z808 Family history of malignant neoplasm of other organs or systems: Secondary | ICD-10-CM | POA: Diagnosis not present

## 2023-07-30 DIAGNOSIS — Z170 Estrogen receptor positive status [ER+]: Secondary | ICD-10-CM | POA: Diagnosis not present

## 2023-07-30 LAB — CBC WITH DIFFERENTIAL/PLATELET
Abs Immature Granulocytes: 0.04 10*3/uL (ref 0.00–0.07)
Basophils Absolute: 0.1 10*3/uL (ref 0.0–0.1)
Basophils Relative: 1 %
Eosinophils Absolute: 0.5 10*3/uL (ref 0.0–0.5)
Eosinophils Relative: 8 %
HCT: 41 % (ref 36.0–46.0)
Hemoglobin: 13.5 g/dL (ref 12.0–15.0)
Immature Granulocytes: 1 %
Lymphocytes Relative: 20 %
Lymphs Abs: 1.2 10*3/uL (ref 0.7–4.0)
MCH: 28.1 pg (ref 26.0–34.0)
MCHC: 32.9 g/dL (ref 30.0–36.0)
MCV: 85.2 fL (ref 80.0–100.0)
Monocytes Absolute: 0.5 10*3/uL (ref 0.1–1.0)
Monocytes Relative: 8 %
Neutro Abs: 3.7 10*3/uL (ref 1.7–7.7)
Neutrophils Relative %: 62 %
Platelets: 239 10*3/uL (ref 150–400)
RBC: 4.81 MIL/uL (ref 3.87–5.11)
RDW: 13.9 % (ref 11.5–15.5)
WBC: 5.9 10*3/uL (ref 4.0–10.5)
nRBC: 0 % (ref 0.0–0.2)

## 2023-07-30 LAB — COMPREHENSIVE METABOLIC PANEL
ALT: 61 U/L — ABNORMAL HIGH (ref 0–44)
AST: 33 U/L (ref 15–41)
Albumin: 3.5 g/dL (ref 3.5–5.0)
Alkaline Phosphatase: 75 U/L (ref 38–126)
Anion gap: 9 (ref 5–15)
BUN: 13 mg/dL (ref 6–20)
CO2: 25 mmol/L (ref 22–32)
Calcium: 9.1 mg/dL (ref 8.9–10.3)
Chloride: 105 mmol/L (ref 98–111)
Creatinine, Ser: 0.7 mg/dL (ref 0.44–1.00)
GFR, Estimated: >60 mL/min (ref >60)
Glucose, Bld: 96 mg/dL (ref 70–99)
Potassium: 4 mmol/L (ref 3.5–5.1)
Sodium: 139 mmol/L (ref 135–145)
Total Bilirubin: 0.4 mg/dL (ref 0.0–1.2)
Total Protein: 6.6 g/dL (ref 6.5–8.1)

## 2023-07-30 LAB — MAGNESIUM: Magnesium: 2.1 mg/dL (ref 1.7–2.4)

## 2023-07-30 MED ORDER — HEPARIN SOD (PORK) LOCK FLUSH 100 UNIT/ML IV SOLN
500.0000 [IU] | Freq: Once | INTRAVENOUS | Status: AC
  Administered 2023-07-30: 500 [IU] via INTRAVENOUS

## 2023-07-30 MED ORDER — SODIUM CHLORIDE 0.9% FLUSH
10.0000 mL | Freq: Once | INTRAVENOUS | Status: AC
  Administered 2023-07-30: 10 mL via INTRAVENOUS

## 2023-07-31 ENCOUNTER — Encounter (INDEPENDENT_AMBULATORY_CARE_PROVIDER_SITE_OTHER): Payer: Self-pay | Admitting: *Deleted

## 2023-07-31 ENCOUNTER — Ambulatory Visit (INDEPENDENT_AMBULATORY_CARE_PROVIDER_SITE_OTHER): Payer: Self-pay | Admitting: Internal Medicine

## 2023-07-31 ENCOUNTER — Encounter: Payer: Self-pay | Admitting: Internal Medicine

## 2023-07-31 ENCOUNTER — Ambulatory Visit: Payer: Managed Care, Other (non HMO) | Admitting: Nutrition

## 2023-07-31 ENCOUNTER — Encounter: Payer: Self-pay | Admitting: Hematology

## 2023-07-31 DIAGNOSIS — F419 Anxiety disorder, unspecified: Secondary | ICD-10-CM

## 2023-07-31 DIAGNOSIS — E559 Vitamin D deficiency, unspecified: Secondary | ICD-10-CM

## 2023-07-31 DIAGNOSIS — E782 Mixed hyperlipidemia: Secondary | ICD-10-CM

## 2023-07-31 DIAGNOSIS — I10 Essential (primary) hypertension: Secondary | ICD-10-CM

## 2023-07-31 DIAGNOSIS — R7303 Prediabetes: Secondary | ICD-10-CM

## 2023-07-31 DIAGNOSIS — Z170 Estrogen receptor positive status [ER+]: Secondary | ICD-10-CM

## 2023-07-31 DIAGNOSIS — Z1211 Encounter for screening for malignant neoplasm of colon: Secondary | ICD-10-CM | POA: Insufficient documentation

## 2023-07-31 DIAGNOSIS — C50212 Malignant neoplasm of upper-inner quadrant of left female breast: Secondary | ICD-10-CM

## 2023-07-31 DIAGNOSIS — F3341 Major depressive disorder, recurrent, in partial remission: Secondary | ICD-10-CM

## 2023-07-31 MED ORDER — ESCITALOPRAM OXALATE 10 MG PO TABS: 10.0000 mg | ORAL_TABLET | Freq: Every day | ORAL | 3 refills | Status: DC

## 2023-07-31 MED ORDER — HYDROXYZINE PAMOATE 50 MG PO CAPS: 50.0000 mg | ORAL_CAPSULE | Freq: Every day | ORAL | Status: DC | PRN

## 2023-07-31 NOTE — Assessment & Plan Note (Signed)
A1c 6.0 on labs from January 2024.  Repeat A1c ordered today.

## 2023-07-31 NOTE — Patient Instructions (Signed)
[facility] [place] - A DEPT OF [facility]  Discharge Instructions: Thank you for choosing [facility] to provide your oncology and hematology care.  If you have a lab appointment with the Cancer Center - please note that after April 8th, 2024, all labs will be drawn in the cancer center.  You do not have to check in or register with the main entrance as you have in the past but will complete your check-in in the cancer center.  Wear comfortable clothing and clothing appropriate for easy access to any Portacath or PICC line.   We strive to give you quality time with your provider. You may need to reschedule your appointment if you arrive late (15 or more minutes).  Arriving late affects you and other patients whose appointments are after yours.  Also, if you miss three or more appointments without notifying the office, you may be dismissed from the clinic at the provider's discretion.      For prescription refill requests, have your pharmacy contact our office and allow 72 hours for refills to be completed.    Today you received the following, port flush with labs today   To help prevent nausea and vomiting after your treatment, we encourage you to take your nausea medication as directed.  BELOW ARE SYMPTOMS THAT SHOULD BE REPORTED IMMEDIATELY: *FEVER GREATER THAN 100.4 F (38 C) OR HIGHER *CHILLS OR SWEATING *NAUSEA AND VOMITING THAT IS NOT CONTROLLED WITH YOUR NAUSEA MEDICATION *UNUSUAL SHORTNESS OF BREATH *UNUSUAL BRUISING OR BLEEDING *URINARY PROBLEMS (pain or burning when urinating, or frequent urination) *BOWEL PROBLEMS (unusual diarrhea, constipation, pain near the anus) TENDERNESS IN MOUTH AND THROAT WITH OR WITHOUT PRESENCE OF ULCERS (sore throat, sores in mouth, or a toothache) UNUSUAL RASH, SWELLING OR PAIN  UNUSUAL VAGINAL DISCHARGE OR ITCHING   Items with * indicate a potential emergency and should be followed up as soon as possible or go  to the Emergency Department if any problems should occur.  Please show the CHEMOTHERAPY ALERT CARD or IMMUNOTHERAPY ALERT CARD at check-in to the Emergency Department and triage nurse.  Should you have questions after your visit or need to cancel or reschedule your appointment, please contact [facility] [place] - A DEPT OF Eligha Bridegroom [facility] [phone]  and follow the prompts.  Office hours are 8:00 a.m. to 4:30 p.m. Monday - Friday. Please note that voicemails left after 4:00 p.m. may not be returned until the following business day.  We are closed weekends and major holidays. You have access to a nurse at all times for urgent questions. Please call the main number to the clinic [phone] and follow the prompts.  For any non-urgent questions, you may also contact your provider using MyChart. We now offer e-Visits for anyone 22 and older to request care online for non-urgent symptoms. For details visit [email or web address].   Also download the MyChart app! Go to the app store, search "MyChart", open the app, select [place], and log in with your MyChart username and password.

## 2023-07-31 NOTE — Assessment & Plan Note (Signed)
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  S/p mastectomy and SLNB in March 2024.  She completed chemotherapy (doxorubicin/Cytoxan) and recently completed radiation.  Currently on tamoxifen.  Oncology follow-up scheduled for next week (2/10).

## 2023-07-31 NOTE — Assessment & Plan Note (Signed)
Remains adequately controlled with losartan 25 mg daily.  No medication changes are indicated today.

## 2023-07-31 NOTE — Assessment & Plan Note (Signed)
Adequately controlled with Lexapro and as needed use of hydroxyzine.  No medication changes are indicated today.

## 2023-07-31 NOTE — Assessment & Plan Note (Signed)
Current weight 265 pounds.  BMI 41.5.  She remains focused on lifestyle modifications aimed at weight loss and has an appointment with medical nutrition therapy scheduled for later today.

## 2023-07-31 NOTE — Progress Notes (Signed)
 Established Patient Office Visit  Subjective   Patient ID: Diana Jensen, female    DOB: 1976/01/10  Age: 48 y.o. MRN: [id number]  Chief Complaint  Patient presents with   Care Management    Follow up    Diana Jensen returns to care today for routine follow-up.  Diana Jensen was last evaluated by me in August 2024.  No medication changes were made at that time and 58-month follow-up was arranged.  In the interim, Diana Jensen has been seen by oncology for follow-up and has completed radiation.  There have otherwise been no acute interval events.  Diana Jensen reports feeling well today.  Diana Jensen is asymptomatic and has no acute concerns to discuss.  Past Medical History:  Diagnosis Date   Allergy    seasonal   Antiphospholipid syndrome (HCC)    Anxiety    Arthritis    deterioration of spine, knees   Carpal tunnel syndrome of right wrist    Clotting disorder (HCC)    antiphospholipid syndrome   Depression    Encounter for general adult medical examination with abnormal findings 07/03/2022   Essential hypertension, benign 02/10/2020   Hyperlipidemia 08/02/2022   Sleep disorder 02/22/2016   Past Surgical History:  Procedure Laterality Date   BREAST BIOPSY Left 08/10/2022   US  LT BREAST BX W LOC DEV 1ST LESION IMG BX SPEC US  GUIDE 08/10/2022 [facility]   BREAST BIOPSY Left 08/18/2022   MM LT BREAST BX W LOC DEV EA AD LESION IMG BX SPEC STEREO GUIDE 08/18/2022 [facility]   BREAST BIOPSY Left 08/18/2022   MM LT BREAST BX W LOC DEV 1ST LESION IMAGE BX SPEC STEREO GUIDE 08/18/2022 [facility]   CARPAL TUNNEL RELEASE Right 01/09/2019   Procedure: RIGHT CARPAL TUNNEL RELEASE;  Surgeon: Margrette Taft BRAVO, MD;  Location: [facility];  Service: Orthopedics;  Laterality: Right;   DILATION AND CURETTAGE OF UTERUS     x2   EYE SURGERY     lazy eye   IRRIGATION AND DEBRIDEMENT HEMATOMA Left 10/05/2022   Procedure: HEMATOMA EVACUATION OF LEFT BREAST WITH JP DRAIN PLACEMENT;  Surgeon: Kallie Manuelita JAYSON, MD;   Location: [facility];  Service: General;  Laterality: Left;   PARTIAL HYSTERECTOMY  2011   PORTACATH PLACEMENT Right 10/05/2022   Procedure: INSERTION PORT-A-CATH;  Surgeon: Kallie Manuelita JAYSON, MD;  Location: [facility];  Service: General;  Laterality: Right;   SIMPLE MASTECTOMY WITH AXILLARY SENTINEL NODE BIOPSY Left 08/28/2022   Procedure: SIMPLE MASTECTOMY WITH AXILLARY SENTINEL NODE BIOPSY;  Surgeon: Kallie Manuelita JAYSON, MD;  Location: [facility];  Service: General;  Laterality: Left;   Social History   Tobacco Use   Smoking status: Every Day    Current packs/day: 0.50    Average packs/day: 0.5 packs/day for 28.0 years (14.0 ttl pk-yrs)    Types: Cigarettes   Smokeless tobacco: Never   Tobacco comments:    Diana Jensen has been smoking since age 78, smokes half a pack daily,was doing one pack a day until last year   Vaping Use   Vaping status: Never Used  Substance Use Topics   Alcohol use: No   Drug use: No   Family History  Problem Relation Age of Onset   Breast cancer Mother 2   Arthritis Mother    Hypertension Mother    Osteoporosis Mother    Thyroid disease Father    Hypertension Father    Skin cancer Father    Mental illness Sister  depression ? bipolar   Dementia Maternal Grandmother    Prostate cancer Maternal Grandfather        prostate   Heart disease Paternal Grandmother    Colon cancer Paternal Grandfather    Mental illness Daughter    Pancreatic cancer Cousin        maternal cousin   Cancer Cousin        paternal, unknown type   Cancer Cousin        paternal, unknown type   Cancer - Lung Neg Hx    Cervical cancer Neg Hx    No Known Allergies  Review of Systems  Constitutional:  Negative for chills and fever.  HENT:  Negative for sore throat.   Respiratory:  Negative for cough and shortness of breath.   Cardiovascular:  Negative for chest pain, palpitations and leg swelling.  Gastrointestinal:  Negative for abdominal pain, blood in stool, constipation,  diarrhea, nausea and vomiting.  Genitourinary:  Negative for dysuria and hematuria.  Musculoskeletal:  Negative for myalgias.  Skin:  Negative for itching and rash.  Neurological:  Negative for dizziness and headaches.  Psychiatric/Behavioral:  Negative for depression and suicidal ideas.      Objective:     BP 128/88 (BP Location: Right Arm, Patient Position: Sitting, Cuff Size: Large)   Pulse 93   Ht 5' 7 (1.702 m)   Wt 265 lb (120.2 kg)   SpO2 94%   BMI 41.50 kg/m  BP Readings from Last 3 Encounters:  07/31/23 128/88  07/30/23 135/89  04/05/23 127/87   Physical Exam Vitals reviewed.  Constitutional:      General: Diana Jensen is not in acute distress.    Appearance: Normal appearance. Diana Jensen is obese. Diana Jensen is not toxic-appearing.  HENT:     Head: Normocephalic and atraumatic.     Right Ear: External ear normal.     Left Ear: External ear normal.     Nose: Nose normal. No congestion or rhinorrhea.     Mouth/Throat:     Mouth: Mucous membranes are moist.     Pharynx: Oropharynx is clear. No oropharyngeal exudate or posterior oropharyngeal erythema.  Eyes:     General: No scleral icterus.    Extraocular Movements: Extraocular movements intact.     Conjunctiva/sclera: Conjunctivae normal.     Pupils: Pupils are equal, round, and reactive to light.  Cardiovascular:     Rate and Rhythm: Normal rate and regular rhythm.     Pulses: Normal pulses.     Heart sounds: Normal heart sounds. No murmur heard.    No friction rub. No gallop.  Pulmonary:     Effort: Pulmonary effort is normal.     Breath sounds: Normal breath sounds. No wheezing, rhonchi or rales.  Abdominal:     General: Abdomen is flat. Bowel sounds are normal. There is no distension.     Palpations: Abdomen is soft.     Tenderness: There is no abdominal tenderness.  Musculoskeletal:        General: No swelling. Normal range of motion.     Cervical back: Normal range of motion.     Right lower leg: No edema.     Left  lower leg: No edema.  Lymphadenopathy:     Cervical: No cervical adenopathy.  Skin:    General: Skin is warm and dry.     Capillary Refill: Capillary refill takes less than 2 seconds.     Coloration: Skin is not jaundiced.  Neurological:  General: No focal deficit present.     Mental Status: Diana Jensen is alert and oriented to person, place, and time.  Psychiatric:        Mood and Affect: Mood normal.        Behavior: Behavior normal.   Last CBC Lab Results  Component Value Date   WBC 5.9 07/30/2023   HGB 13.5 07/30/2023   HCT 41.0 07/30/2023   MCV 85.2 07/30/2023   MCH 28.1 07/30/2023   RDW 13.9 07/30/2023   PLT 239 07/30/2023   Last metabolic panel Lab Results  Component Value Date   GLUCOSE 96 07/30/2023   NA 139 07/30/2023   K 4.0 07/30/2023   CL 105 07/30/2023   CO2 25 07/30/2023   BUN 13 07/30/2023   CREATININE 0.70 07/30/2023   GFRNONAA >60 07/30/2023   CALCIUM 9.1 07/30/2023   PROT 6.6 07/30/2023   ALBUMIN 3.5 07/30/2023   LABGLOB 2.4 07/03/2022   AGRATIO 1.9 07/03/2022   BILITOT 0.4 07/30/2023   ALKPHOS 75 07/30/2023   AST 33 07/30/2023   ALT 61 (H) 07/30/2023   ANIONGAP 9 07/30/2023   Last lipids Lab Results  Component Value Date   CHOL 254 (H) 07/03/2022   HDL 33 (L) 07/03/2022   LDLCALC 163 (H) 07/03/2022   TRIG 308 (H) 07/03/2022   CHOLHDL 7.7 (H) 07/03/2022   Last hemoglobin A1c Lab Results  Component Value Date   HGBA1C 6.0 (H) 07/03/2022   Last thyroid functions Lab Results  Component Value Date   TSH 1.410 07/03/2022   Last vitamin D  Lab Results  Component Value Date   VD25OH 23.76 (L) 11/27/2022   Last vitamin B12 and Folate Lab Results  Component Value Date   VITAMINB12 281 07/03/2022   FOLATE 6.2 07/03/2022   The 10-year ASCVD risk score (Arnett DK, et al., 2019) is: 12%    Assessment & Plan:   Problem List Items Addressed This Visit       Essential hypertension, benign   Remains adequately controlled with losartan   25 mg daily.  No medication changes are indicated today.      Depression, major, recurrent, in partial remission (HCC)   Mood remains stable with Lexapro  10 mg daily.  Followed by counseling in [place].  No medication changes are indicated today.  Lexapro  refilled.      Anxiety   Adequately controlled with Lexapro  and as needed use of hydroxyzine .  No medication changes are indicated today.      Prediabetes   A1c 6.0 on labs from January 2024.  Repeat A1c ordered today.      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  S/p mastectomy and SLNB in March 2024.  Diana Jensen completed chemotherapy (doxorubicin /Cytoxan ) and recently completed radiation.  Currently on tamoxifen .  Oncology follow-up scheduled for next week (2/10).      Morbid obesity (HCC) - Primary   Current weight 265 pounds.  BMI 41.5.  Diana Jensen remains focused on lifestyle modifications aimed at weight loss and has an appointment with medical nutrition therapy scheduled for later today.      Colon cancer screening   Gastroenterology referral placed today for screening colonoscopy.  Patient endorses a history of colon cancer in her paternal grandfather.  Age of diagnosis is unclear.      Return in about 6 months (around 01/28/2024).   Manus FORBES Fireman, MD

## 2023-07-31 NOTE — Patient Instructions (Signed)
 It was a pleasure to see you today.  Thank you for giving us  the opportunity to be involved in your care.  Below is a brief recap of your visit and next steps.  We will plan to see you again in 6 months.  Summary No medication changes today Refills provided Repeat labs ordered Follow up in 6 months Gastroenterology referral placed today for colonoscopy

## 2023-07-31 NOTE — Progress Notes (Signed)
Late entry: Diana Jensen presented for Portacath access and flush. Proper placement of portacath confirmed by CXR. Portacath located right chest wall accessed with  H 20 needle. Good blood return present. Portacath flushed with 20ml NS and 500U/25ml Heparin and needle removed intact. Procedure without incident. Patient tolerated procedure well.

## 2023-07-31 NOTE — Assessment & Plan Note (Signed)
Mood remains stable with Lexapro 10 mg daily.  Followed by counseling in [place].  No medication changes are indicated today.  Lexapro refilled.

## 2023-07-31 NOTE — Assessment & Plan Note (Signed)
Gastroenterology referral placed today for screening colonoscopy.  Patient endorses a history of colon cancer in her paternal grandfather.  Age of diagnosis is unclear.

## 2023-08-01 ENCOUNTER — Encounter: Payer: Self-pay | Admitting: Internal Medicine

## 2023-08-01 LAB — VITAMIN D 25 HYDROXY (VIT D DEFICIENCY, FRACTURES): Vit D, 25-Hydroxy: 37 ng/mL (ref 30.0–100.0)

## 2023-08-01 LAB — LIPID PANEL
Chol/HDL Ratio: 6.1 {ratio} — ABNORMAL HIGH (ref 0.0–4.4)
Cholesterol, Total: 224 mg/dL — ABNORMAL HIGH (ref 100–199)
HDL: 37 mg/dL — ABNORMAL LOW (ref >39)
LDL Chol Calc (NIH): 144 mg/dL — ABNORMAL HIGH (ref 0–99)
Triglycerides: 238 mg/dL — ABNORMAL HIGH (ref 0–149)
VLDL Cholesterol Cal: 43 mg/dL — ABNORMAL HIGH (ref 5–40)

## 2023-08-01 LAB — B12 AND FOLATE PANEL
Folate: 6.9 ng/mL (ref >3.0)
Vitamin B-12: 352 pg/mL (ref 232–1245)

## 2023-08-01 LAB — HEMOGLOBIN A1C
Est. average glucose Bld gHb Est-mCnc: 131 mg/dL
Hgb A1c MFr Bld: 6.2 % — ABNORMAL HIGH (ref 4.8–5.6)

## 2023-08-01 LAB — TSH+FREE T4
Free T4: 1 ng/dL (ref 0.82–1.77)
TSH: 1.56 u[IU]/mL (ref 0.450–4.500)

## 2023-08-06 ENCOUNTER — Inpatient Hospital Stay (HOSPITAL_BASED_OUTPATIENT_CLINIC_OR_DEPARTMENT_OTHER): Payer: Managed Care, Other (non HMO) | Admitting: Hematology

## 2023-08-06 VITALS — BP 122/82 | HR 86 | Temp 98.1°F | Resp 16 | Wt 266.8 lb

## 2023-08-06 DIAGNOSIS — C50212 Malignant neoplasm of upper-inner quadrant of left female breast: Secondary | ICD-10-CM

## 2023-08-06 DIAGNOSIS — C50112 Malignant neoplasm of central portion of left female breast: Secondary | ICD-10-CM | POA: Diagnosis not present

## 2023-08-06 DIAGNOSIS — Z170 Estrogen receptor positive status [ER+]: Secondary | ICD-10-CM | POA: Diagnosis not present

## 2023-08-06 DIAGNOSIS — Z1231 Encounter for screening mammogram for malignant neoplasm of breast: Secondary | ICD-10-CM

## 2023-08-06 MED ORDER — PROCHLORPERAZINE MALEATE 10 MG PO TABS: 10.0000 mg | ORAL_TABLET | Freq: Four times a day (QID) | ORAL | 3 refills | Status: DC | PRN

## 2023-08-06 NOTE — Progress Notes (Signed)
 [facility] [address], [place] [address]    Clinic Day:  08/06/2023  Referring physician: Tobi Fortes, MD  Patient Care Team: Tobi Fortes, MD as PCP - General (Internal Medicine) Paulett Boros, MD as Consulting Physician (Hematology)   ASSESSMENT & PLAN:   Assessment: 1.  T1cN1 G2 left breast IDC, ER/PR positive, HER2 negative: - She felt a lump in her breast in December 2023. - Left breast subareolar mass at 9:00 biopsy (08/10/2022): grade 2 IDC, ER 95% strong intensity, PR 90% strong intensity, Ki-67 10%, HER2 1+ - Left mastectomy and SLNB (08/28/2022): 1.6 x 1.2 x 0.7 cm IDC, margins negative, LVI present, 1/1 lymph node with metastatic carcinoma, metastasis 15 mm, focal extracapsular extension.  PT1 cpN1a. 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  No prior history of arterial/venous thrombosis/strokes. - PET scan (10/19/2022): No evidence of hypermetabolic metastatic disease.  Diffuse hepatic steatosis. - 4 cycles of adjuvant AC from 10/25/2022 through 12/12/2022.  Weekly paclitaxel  started on 12/27/2022, completed on 03/15/2023 - Germline mutation testing: Negative - Tamoxifen  20 mg daily started on 04/05/2023 - XRT to the left chest wall to start on 04/16/2023, completed on 06/01/2023   2.  Social/family history: - Seen today with her husband and daughter.  She worked as a Conservation officer, nature.  Current active smoker, 5 to 7 cigarettes/day, started at age 48. - Mother had breast cancer at age 54.  Father had skin cancer.  Paternal grandfather had colon cancer.  Maternal grandfather had prostate cancer.  1 maternal cousin had pancreatic cancer.  2 paternal cousins have cancer.    Plan: 1.  T1cN1 G2 left breast IDC, ER/PR positive, HER2 negative: - She is tolerating tamoxifen  reasonably well.  She reported occasional nausea and vomiting when she takes tamoxifen  at 8 AM in the morning.  She reports cough leading into vomiting 3-4 times per  week.  She has taken Compazine  which causes sleepiness. - Recommend that she switch tamoxifen  to evening and take Compazine  with it if needed. - Denies vaginal bleeding.  Has hot flashes both days and nights.  Also has some mood swings. - Continue tamoxifen .  Will arrange for right breast mammogram.  RTC 4 months for follow-up.  Will check estradiol  and LH levels.   2.  Peripheral neuropathy: - Constant numbness in the fingertips and toes is stable.  No medical intervention necessary.   3.  Low vitamin D  levels: - Vitamin D  level is 37.  Continue vitamin D  1000 units daily    No orders of the defined types were placed in this encounter.     I,Katie Daubenspeck,acting as a Neurosurgeon for Paulett Boros, MD.,have documented all relevant documentation on the behalf of Paulett Boros, MD,as directed by  Paulett Boros, MD while in the presence of Paulett Boros, MD.   I, Paulett Boros MD, have reviewed the above documentation for accuracy and completeness, and I agree with the above.   Katie Daubenspeck   2/10/20259:21 AM  CHIEF COMPLAINT:   Diagnosis: left breast cancer   Cancer Staging  Breast cancer of upper-inner quadrant of left female breast [name]) Staging form: Breast, AJCC 8th Edition - Clinical stage from 10/01/2022: Stage IB (cT1c, cN1, cM0, G2, ER+, PR+, HER2-) - Signed by Paulett Boros, MD on 10/01/2022    Prior Therapy: 1. left mastectomy and +SLNB with Dr. Deena Farrier on 08/28/22  2. Adjuvant AC, 4 cycles, 10/25/22 - 12/12/22 3. weekly paclitaxel , 12/27/22 -  03/15/23 4. XRT to left chest wall, 04/18/23 - 06/01/23  Current Therapy:  tamoxifen    HISTORY OF PRESENT ILLNESS:   Oncology History  Breast cancer of upper-inner quadrant of left female breast (HCC)  10/01/2022 Initial Diagnosis   Breast cancer of upper-inner quadrant of left female breast   10/01/2022 Cancer Staging   Staging form: Breast, AJCC 8th Edition - Clinical stage from  10/01/2022: Stage IB (cT1c, cN1, cM0, G2, ER+, PR+, HER2-) - Signed by Paulett Boros, MD on 10/01/2022 Histopathologic type: Infiltrating duct carcinoma, NOS Stage prefix: Initial diagnosis Nuclear grade: G2 Histologic grading system: 3 grade system   10/25/2022 -  Chemotherapy   Patient is on Treatment Plan : BREAST ADJUVANT DOSE DENSE AC q14d / PACLitaxel  q7d      Genetic Testing   No pathogenic variants identified on the [facility] Common Hereditary Cancers+RNA panel. VUS in PDGFRA called c.502G>C identified. The report date is 01/16/2023.  The Common Hereditary Cancers Panel + RNA offered by [facility] includes sequencing and/or deletion duplication testing of the following 48 genes: APC*, ATM*, AXIN2, BAP1, BARD1, BMPR1A, BRCA1, BRCA2, BRIP1, CDH1, CDK4, CDKN2A (p14ARF), CDKN2A (p16INK4a), CHEK2, CTNNA1, DICER1*, EPCAM*, FH*, GREM1*, HOXB13, KIT, MBD4, MEN1*, MLH1*, MSH2*, MSH3*, MSH6*, MUTYH, NF1*, NTHL1, PALB2, PDGFRA, PMS2*, POLD1*, POLE, PTEN*, RAD51C, RAD51D, SDHA*, SDHB, SDHC*, SDHD, SMAD4, SMARCA4, STK11, TP53, TSC1*, TSC2, VHL.       INTERVAL HISTORY:   Diana Jensen is a 48 y.o. female presenting to clinic today for follow up of left breast cancer. She was last seen by me on 04/05/23.  Since her last visit, she completed radiation therapy to the left chest wall on 06/01/23.  Today, she states that she is doing well overall. Her appetite level is at 100%. Her energy level is at 40%.  PAST MEDICAL HISTORY:   Past Medical History: Past Medical History:  Diagnosis Date   Allergy    seasonal   Antiphospholipid syndrome (HCC)    Anxiety    Arthritis    deterioration of spine, knees   Carpal tunnel syndrome of right wrist    Clotting disorder (HCC)    antiphospholipid syndrome   Depression    Encounter for general adult medical examination with abnormal findings 07/03/2022   Essential hypertension, benign 02/10/2020   Hyperlipidemia 08/02/2022   Sleep disorder 02/22/2016     Surgical History: Past Surgical History:  Procedure Laterality Date   BREAST BIOPSY Left 08/10/2022   US  LT BREAST BX W LOC DEV 1ST LESION IMG BX SPEC US  GUIDE 08/10/2022 [facility]   BREAST BIOPSY Left 08/18/2022   MM LT BREAST BX W LOC DEV EA AD LESION IMG BX SPEC STEREO GUIDE 08/18/2022 [facility]   BREAST BIOPSY Left 08/18/2022   MM LT BREAST BX W LOC DEV 1ST LESION IMAGE BX SPEC STEREO GUIDE 08/18/2022 [facility]   CARPAL TUNNEL RELEASE Right 01/09/2019   Procedure: RIGHT CARPAL TUNNEL RELEASE;  Surgeon: Darrin Emerald, MD;  Location: [facility];  Service: Orthopedics;  Laterality: Right;   DILATION AND CURETTAGE OF UTERUS     x2   EYE SURGERY     lazy eye   IRRIGATION AND DEBRIDEMENT HEMATOMA Left 10/05/2022   Procedure: HEMATOMA EVACUATION OF LEFT BREAST WITH JP DRAIN PLACEMENT;  Surgeon: Awilda Bogus, MD;  Location: [facility];  Service: General;  Laterality: Left;   PARTIAL HYSTERECTOMY  2011   PORTACATH PLACEMENT Right 10/05/2022   Procedure: INSERTION PORT-A-CATH;  Surgeon: Awilda Bogus, MD;  Location: [facility];  Service: General;  Laterality: Right;   SIMPLE MASTECTOMY WITH AXILLARY SENTINEL NODE BIOPSY Left 08/28/2022   Procedure: SIMPLE MASTECTOMY WITH AXILLARY SENTINEL NODE BIOPSY;  Surgeon: Awilda Bogus, MD;  Location: [facility];  Service: General;  Laterality: Left;    Social History: Social History   Socioeconomic History   Marital status: Married    Spouse name: Not on file   Number of children: 2   Years of education: Not on file   Highest education level: Not on file  Occupational History   Not on file  Tobacco Use   Smoking status: Every Day    Current packs/day: 0.50    Average packs/day: 0.5 packs/day for 28.0 years (14.0 ttl pk-yrs)    Types: Cigarettes   Smokeless tobacco: Never   Tobacco comments:    She has been smoking since age 63, smokes half a pack daily,was doing one pack a day until last year   Vaping Use    Vaping status: Never Used  Substance and Sexual Activity   Alcohol use: No   Drug use: No   Sexual activity: Yes    Birth control/protection: Surgical  Other Topics Concern   Not on file  Social History Narrative   Associates degree in Engineer, site. 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 Has 2 children 1 son and 1 daughter. Married x 15 years.   Social Drivers of Corporate investment banker Strain: High Risk (11/09/2022)   Overall Financial Resource Strain (CARDIA)    Difficulty of Paying Living Expenses: Hard  Food Insecurity: No Food Insecurity (08/30/2022)   Hunger Vital Sign    Worried About Running Out of Food in the Last Year: Never true    Ran Out of Food in the Last Year: Never true  Transportation Needs: No Transportation Needs (08/30/2022)   PRAPARE - Administrator, Civil Service (Medical): No    Lack of Transportation (Non-Medical): No  Physical Activity: Not on file  Stress: Not on file  Social Connections: Not on file  Intimate Partner Violence: Not At Risk (08/30/2022)   Humiliation, Afraid, Rape, and Kick questionnaire    Fear of Current or Ex-Partner: No    Emotionally Abused: No    Physically Abused: No    Sexually Abused: No    Family History: Family History  Problem Relation Age of Onset   Breast cancer Mother 39   Arthritis Mother    Hypertension Mother    Osteoporosis Mother    Thyroid disease Father    Hypertension Father    Skin cancer Father    Mental illness Sister        depression ? bipolar   Dementia Maternal Grandmother    Prostate cancer Maternal Grandfather        prostate   Heart disease Paternal Grandmother    Colon cancer Paternal Grandfather    Mental illness Daughter    Pancreatic cancer Cousin        maternal cousin   Cancer Cousin        paternal, unknown type   Cancer Cousin        paternal, unknown type   Cancer - Lung Neg Hx    Cervical cancer Neg Hx     Current  Medications:  Current Outpatient Medications:    acetaminophen  (TYLENOL ) 500 MG tablet, Take 1,000 mg by mouth every 6 (six) hours as needed for headache. (Patient not taking: Reported on 04/05/2023),  Disp: , Rfl:    Cholecalciferol (VITAMIN D -3) 25 MCG (1000 UT) CAPS, Take 1 capsule by mouth daily., Disp: , Rfl:    escitalopram  (LEXAPRO ) 10 MG tablet, Take 1 tablet (10 mg total) by mouth daily., Disp: 90 tablet, Rfl: 3   hydrOXYzine  (VISTARIL ) 50 MG capsule, Take 1 capsule (50 mg total) by mouth daily as needed for anxiety., Disp: , Rfl:    losartan  (COZAAR ) 25 MG tablet, Take 1 tablet (25 mg total) by mouth daily., Disp: 90 tablet, Rfl: 1   tamoxifen  (NOLVADEX ) 20 MG tablet, Take 1 tablet (20 mg total) by mouth daily., Disp: 90 tablet, Rfl: 3   Allergies: No Known Allergies  REVIEW OF SYSTEMS:   Review of Systems  Constitutional:  Negative for chills, fatigue and fever.  HENT:   Negative for lump/mass, mouth sores, nosebleeds, sore throat and trouble swallowing.   Eyes:  Negative for eye problems.  Respiratory:  Negative for cough and shortness of breath.   Cardiovascular:  Negative for chest pain, leg swelling and palpitations.  Gastrointestinal:  Positive for nausea. Negative for abdominal pain, constipation, diarrhea and vomiting.  Endocrine: Positive for hot flashes.  Genitourinary:  Negative for bladder incontinence, difficulty urinating, dysuria, frequency, hematuria and nocturia.   Musculoskeletal:  Negative for arthralgias, back pain, flank pain, myalgias and neck pain.  Skin:  Negative for itching and rash.  Neurological:  Positive for numbness. Negative for dizziness and headaches.  Hematological:  Does not bruise/bleed easily.  Psychiatric/Behavioral:  Positive for sleep disturbance. Negative for depression and suicidal ideas. The patient is not nervous/anxious.   All other systems reviewed and are negative.    VITALS:   There were no vitals taken for this visit.  Wt  Readings from Last 3 Encounters:  07/31/23 265 lb (120.2 kg)  06/11/23 266 lb (120.7 kg)  04/05/23 265 lb (120.2 kg)    There is no height or weight on file to calculate BMI.  Performance status (ECOG): 0 - Asymptomatic  PHYSICAL EXAM:   Physical Exam Vitals and nursing note reviewed. Exam conducted with a chaperone present.  Constitutional:      Appearance: Normal appearance.  Cardiovascular:     Rate and Rhythm: Normal rate and regular rhythm.     Pulses: Normal pulses.     Heart sounds: Normal heart sounds.  Pulmonary:     Effort: Pulmonary effort is normal.     Breath sounds: Normal breath sounds.  Abdominal:     Palpations: Abdomen is soft. There is no hepatomegaly, splenomegaly or mass.     Tenderness: There is no abdominal tenderness.  Musculoskeletal:     Right lower leg: No edema.     Left lower leg: No edema.  Lymphadenopathy:     Cervical: No cervical adenopathy.     Right cervical: No superficial, deep or posterior cervical adenopathy.    Left cervical: No superficial, deep or posterior cervical adenopathy.     Upper Body:     Right upper body: No supraclavicular or axillary adenopathy.     Left upper body: No supraclavicular or axillary adenopathy.  Neurological:     General: No focal deficit present.     Mental Status: She is alert and oriented to person, place, and time.  Psychiatric:        Mood and Affect: Mood normal.        Behavior: Behavior normal.     LABS:   CBC     Component Value Date/Time  WBC 5.9 07/30/2023 1058   RBC 4.81 07/30/2023 1058   HGB 13.5 07/30/2023 1058   HGB 14.7 07/03/2022 1356   HCT 41.0 07/30/2023 1058   HCT 42.1 07/03/2022 1356   PLT 239 07/30/2023 1058   PLT 307 07/03/2022 1356   MCV 85.2 07/30/2023 1058   MCV 82 07/03/2022 1356   MCV 83 03/31/2013 1433   MCH 28.1 07/30/2023 1058   MCHC 32.9 07/30/2023 1058   RDW 13.9 07/30/2023 1058   RDW 13.3 07/03/2022 1356   RDW 13.6 03/31/2013 1433   LYMPHSABS 1.2  07/30/2023 1058   LYMPHSABS 2.2 07/03/2022 1356   MONOABS 0.5 07/30/2023 1058   EOSABS 0.5 07/30/2023 1058   EOSABS 0.4 07/03/2022 1356   BASOSABS 0.1 07/30/2023 1058   BASOSABS 0.1 07/03/2022 1356    CMP      Component Value Date/Time   NA 139 07/30/2023 1058   NA 142 07/03/2022 1356   NA 139 03/31/2013 1433   K 4.0 07/30/2023 1058   K 3.4 (L) 03/31/2013 1433   CL 105 07/30/2023 1058   CL 107 03/31/2013 1433   CO2 25 07/30/2023 1058   CO2 23 03/31/2013 1433   GLUCOSE 96 07/30/2023 1058   GLUCOSE 137 (H) 03/31/2013 1433   BUN 13 07/30/2023 1058   BUN 18 07/03/2022 1356   BUN 11 03/31/2013 1433   CREATININE 0.70 07/30/2023 1058   CREATININE 0.73 02/10/2020 1126   CALCIUM 9.1 07/30/2023 1058   CALCIUM 8.8 03/31/2013 1433   PROT 6.6 07/30/2023 1058   PROT 7.0 07/03/2022 1356   ALBUMIN 3.5 07/30/2023 1058   ALBUMIN 4.6 07/03/2022 1356   AST 33 07/30/2023 1058   ALT 61 (H) 07/30/2023 1058   ALKPHOS 75 07/30/2023 1058   BILITOT 0.4 07/30/2023 1058   BILITOT 0.4 07/03/2022 1356   GFRNONAA >60 07/30/2023 1058   GFRNONAA 102 02/10/2020 1126   GFRAA 119 02/10/2020 1126     No results found for: "CEA1", "CEA" / No results found for: "CEA1", "CEA" No results found for: "PSA1" No results found for: "ZHY865" No results found for: "CAN125"  No results found for: "TOTALPROTELP", "ALBUMINELP", "A1GS", "A2GS", "BETS", "BETA2SER", "GAMS", "MSPIKE", "SPEI" No results found for: "TIBC", "FERRITIN", "IRONPCTSAT" No results found for: "LDH"   STUDIES:   No results found.

## 2023-08-06 NOTE — Patient Instructions (Addendum)
 [facility] at [facility] Discharge Instructions   You were seen and examined today by Dr. Cheree Cords.  He reviewed the results of your lab work which are normal/stable.   We will arrange for you to have a mammogram this month.   We will see you back in 4 months. We will repeat lab work prior to this visit.   Return as scheduled.    Thank you for choosing [facility] at [facility] to provide your oncology and hematology care.  To afford each patient quality time with our provider, please arrive at least 15 minutes before your scheduled appointment time.   If you have a lab appointment with the Cancer Center please come in thru the Main Entrance and check in at the main information desk.  You need to re-schedule your appointment should you arrive 10 or more minutes late.  We strive to give you quality time with our providers, and arriving late affects you and other patients whose appointments are after yours.  Also, if you no show three or more times for appointments you may be dismissed from the clinic at the providers discretion.     Again, thank you for choosing [facility]  Our hope is that these requests will decrease the amount of time that you wait before being seen by our physicians.       _____________________________________________________________  Should you have questions after your visit to [facility], please contact our office at [phone] and follow the prompts.  Our office hours are 8:00 a.m. and 4:30 p.m. Monday - Friday.  Please note that voicemails left after 4:00 p.m. may not be returned until the following business day.  We are closed weekends and major holidays.  You do have access to a nurse 24-7, just call the main number to the clinic [phone] and do not press any options, hold on the line and a nurse will answer the phone.    For prescription refill requests, have your pharmacy  contact our office and allow 72 hours.    Due to Covid, you will need to wear a mask upon entering the hospital. If you do not have a mask, a mask will be given to you at the Main Entrance upon arrival. For doctor visits, patients may have 1 support person age 69 or older with them. For treatment visits, patients can not have anyone with them due to social distancing guidelines and our immunocompromised population.

## 2023-08-06 NOTE — Progress Notes (Signed)
 Patient is taking tamoxifen  as prescribed.  She has not missed but one dose and reports no side effects at this time other than hot flashes and nausea.

## 2023-08-07 ENCOUNTER — Other Ambulatory Visit: Payer: Self-pay

## 2023-08-13 ENCOUNTER — Ambulatory Visit (HOSPITAL_COMMUNITY)
Admission: RE | Admit: 2023-08-13 | Discharge: 2023-08-13 | Disposition: A | Discharge status: Home / Self Care | Payer: Managed Care, Other (non HMO) | Source: Ambulatory Visit | Attending: Hematology | Admitting: Hematology

## 2023-08-13 DIAGNOSIS — Z1231 Encounter for screening mammogram for malignant neoplasm of breast: Secondary | ICD-10-CM | POA: Diagnosis present

## 2023-11-20 ENCOUNTER — Encounter: Payer: Self-pay | Admitting: Hematology

## 2023-11-25 ENCOUNTER — Other Ambulatory Visit: Payer: Self-pay

## 2023-11-25 ENCOUNTER — Encounter (HOSPITAL_COMMUNITY): Payer: Self-pay

## 2023-11-25 ENCOUNTER — Emergency Department (HOSPITAL_COMMUNITY)
Admission: EM | Admit: 2023-11-25 | Discharge: 2023-11-25 | Disposition: A | Discharge status: Home / Self Care | Attending: Emergency Medicine | Admitting: Emergency Medicine

## 2023-11-25 ENCOUNTER — Emergency Department (HOSPITAL_COMMUNITY)

## 2023-11-25 DIAGNOSIS — S92354A Nondisplaced fracture of fifth metatarsal bone, right foot, initial encounter for closed fracture: Secondary | ICD-10-CM | POA: Diagnosis not present

## 2023-11-25 DIAGNOSIS — M7989 Other specified soft tissue disorders: Secondary | ICD-10-CM | POA: Insufficient documentation

## 2023-11-25 DIAGNOSIS — Z853 Personal history of malignant neoplasm of breast: Secondary | ICD-10-CM | POA: Insufficient documentation

## 2023-11-25 DIAGNOSIS — S99921A Unspecified injury of right foot, initial encounter: Secondary | ICD-10-CM | POA: Diagnosis present

## 2023-11-25 DIAGNOSIS — S92344A Nondisplaced fracture of fourth metatarsal bone, right foot, initial encounter for closed fracture: Secondary | ICD-10-CM | POA: Insufficient documentation

## 2023-11-25 DIAGNOSIS — W108XXA Fall (on) (from) other stairs and steps, initial encounter: Secondary | ICD-10-CM | POA: Diagnosis not present

## 2023-11-25 MED ORDER — MORPHINE SULFATE (PF) 4 MG/ML IV SOLN
4.0000 mg | Freq: Once | INTRAVENOUS | Status: AC
  Administered 2023-11-25: 4 mg via INTRAMUSCULAR
  Filled 2023-11-25: qty 1

## 2023-11-25 MED ORDER — OXYCODONE-ACETAMINOPHEN 5-325 MG PO TABS: 1.0000 | ORAL_TABLET | Freq: Four times a day (QID) | ORAL | 0 refills | Status: DC | PRN

## 2023-11-25 MED ORDER — IBUPROFEN 600 MG PO TABS: 600.0000 mg | ORAL_TABLET | Freq: Four times a day (QID) | ORAL | 0 refills | Status: DC | PRN

## 2023-11-25 NOTE — ED Notes (Signed)
 Patient verbalizes understanding of discharge instructions. Opportunity for questioning and answers were provided. Armband removed by staff, pt discharged from ED. Wheeled out to car, husband to drive home

## 2023-11-25 NOTE — ED Provider Notes (Signed)
 [place] EMERGENCY DEPARTMENT AT [facility] Provider Note   CSN: [id number] Arrival date & time: 11/25/23  2051     History  Chief Complaint  Patient presents with   Foot Injury    Diana Jensen is a 48 y.o. female.  Pt is a 48 yo female with pmhx significant for depression, antiphospholipid syndrome (no dvt/pe, just miscarriages), arthritis, hld, left breast cancer s/p mastectomy, and obesity.  Pt missed a step and landed wrong on her right foot.  She has been unable to put any weight on it.  No other injuries.       Home Medications Prior to Admission medications   Medication Sig Start Date End Date Taking? Authorizing Provider  ibuprofen  (ADVIL ) 600 MG tablet Take 1 tablet (600 mg total) by mouth every 6 (six) hours as needed. 11/25/23  Yes Sueellen Emery, MD  oxyCODONE -acetaminophen  (PERCOCET/ROXICET) 5-325 MG tablet Take 1 tablet by mouth every 6 (six) hours as needed for severe pain (pain score 7-10). 11/25/23  Yes Sueellen Emery, MD  acetaminophen  (TYLENOL ) 500 MG tablet Take 1,000 mg by mouth every 6 (six) hours as needed for headache.    [provider]  Cholecalciferol (VITAMIN D -3) 25 MCG (1000 UT) CAPS Take 1 capsule by mouth daily.    [provider]  escitalopram  (LEXAPRO ) 10 MG tablet Take 1 tablet (10 mg total) by mouth daily. 07/31/23   Tobi Fortes, MD  hydrOXYzine  (VISTARIL ) 50 MG capsule Take 1 capsule (50 mg total) by mouth daily as needed for anxiety. 07/31/23   Tobi Fortes, MD  losartan  (COZAAR ) 25 MG tablet Take 1 tablet (25 mg total) by mouth daily. 01/29/23   Tobi Fortes, MD  prochlorperazine  (COMPAZINE ) 10 MG tablet Take 1 tablet (10 mg total) by mouth every 6 (six) hours as needed. 08/06/23   Paulett Boros, MD  tamoxifen  (NOLVADEX ) 20 MG tablet Take 1 tablet (20 mg total) by mouth daily. 04/05/23   Paulett Boros, MD      Allergies    Patient has no known allergies.    Review of Systems   Review of  Systems  Musculoskeletal:        Right foot pain  All other systems reviewed and are negative.   Physical Exam Updated Vital Signs BP (!) 148/93   Pulse 87   Temp 98.6 F (37 C) (Oral)   Resp 18   Ht 5\' 7"  (1.702 m)   Wt 113.4 kg   SpO2 98%   BMI 39.16 kg/m  Physical Exam Vitals and nursing note reviewed.  Constitutional:      Appearance: Normal appearance.  HENT:     Head: Normocephalic and atraumatic.     Right Ear: External ear normal.     Left Ear: External ear normal.     Nose: Nose normal.     Mouth/Throat:     Mouth: Mucous membranes are moist.     Pharynx: Oropharynx is clear.  Eyes:     Extraocular Movements: Extraocular movements intact.     Conjunctiva/sclera: Conjunctivae normal.     Pupils: Pupils are equal, round, and reactive to light.  Cardiovascular:     Rate and Rhythm: Normal rate and regular rhythm.     Pulses: Normal pulses.     Heart sounds: Normal heart sounds.  Pulmonary:     Effort: Pulmonary effort is normal.     Breath sounds: Normal breath sounds.  Chest:     Comments: S/p  mastectomy left breast Abdominal:     General: Abdomen is flat. Bowel sounds are normal.     Palpations: Abdomen is soft.  Musculoskeletal:     Cervical back: Normal range of motion and neck supple.       Feet:     Comments: Right foot with pain to palpation  Skin:    General: Skin is warm.     Capillary Refill: Capillary refill takes less than 2 seconds.  Neurological:     General: No focal deficit present.     Mental Status: She is alert and oriented to person, place, and time.  Psychiatric:        Mood and Affect: Mood normal.        Behavior: Behavior normal.     ED Results / Procedures / Treatments   Labs (all labs ordered are listed, but only abnormal results are displayed) Labs Reviewed - No data to display  EKG None  Radiology DG Foot Complete Right Result Date: 11/25/2023 EXAM: 3 or more VIEW(S) XRAY OF THE RIGHT FOOT 11/25/2023 10:32:00 PM  COMPARISON: None available. CLINICAL HISTORY: Pain. Pt having pain on top of foot. FINDINGS: BONES AND JOINTS: Nondisplaced transverse fifth metatarsal neck fracture. Nondisplaced oblique fourth distal metatarsal shaft fracture. SOFT TISSUES: The soft tissues are unremarkable. IMPRESSION: 1. Nondisplaced transverse fifth metatarsal neck fracture. 2. Nondisplaced oblique fourth distal metatarsal shaft fracture. Electronically signed by: Zadie Herter MD 11/25/2023 10:34 PM EDT RP Workstation: [id number]   DG Ankle Complete Right Result Date: 11/25/2023 CLINICAL DATA:  Fall EXAM: RIGHT ANKLE - COMPLETE 3+ VIEW COMPARISON:  None Available. FINDINGS: There is no evidence of fracture, dislocation, or joint effusion. There is no evidence of arthropathy or other focal bone abnormality. There is lateral soft tissue swelling. IMPRESSION: Lateral soft tissue swelling without evidence of fracture or dislocation. Electronically Signed   By: Tyron Gallon M.D.   On: 11/25/2023 21:37    Procedures Procedures    Medications Ordered in ED Medications  morphine  (PF) 4 MG/ML injection 4 mg (4 mg Intramuscular Given 11/25/23 2235)    ED Course/ Medical Decision Making/ A&P                                 Medical Decision Making Amount and/or Complexity of Data Reviewed Radiology: ordered.  Risk Prescription drug management.   This patient presents to the ED for concern of right foot pain, this involves an extensive number of treatment options, and is a complaint that carries with it a high risk of complications and morbidity.  The differential diagnosis includes fx, sprain   Co morbidities that complicate the patient evaluation  depression, antiphospholipid syndrome (no dvt/pe, just miscarriages), arthritis, hld, left breast cancer s/p mastectomy, and obesity   Additional history obtained:  Additional history obtained from epic chart review External records from outside source obtained and  reviewed including husband  Imaging Studies ordered:  I ordered imaging studies including right foot, right ankle  I independently visualized and interpreted imaging which showed  Ankle: Lateral soft tissue swelling without evidence of fracture or  dislocation.  Foot: Nondisplaced transverse fifth metatarsal neck fracture.  2. Nondisplaced oblique fourth distal metatarsal shaft fracture.   I agree with the radiologist interpretation   Medicines ordered and prescription drug management:  I ordered medication including morphine   for sx  Reevaluation of the patient after these medicines showed that the patient improved I  have reviewed the patients home medicines and have made adjustments as needed   Test Considered:  xr   Problem List / ED Course:  Right 4th and 5th MT fx:  pt placed in a cam walker and is given crutches.  She needs to f/u with ortho.  Return if worse.    Reevaluation:  After the interventions noted above, I reevaluated the patient and found that they have :improved   Social Determinants of Health:  Lives at home   Dispostion:  After consideration of the diagnostic results and the patients response to treatment, I feel that the patent would benefit from discharge with outpatient f/u.          Final Clinical Impression(s) / ED Diagnoses Final diagnoses:  Closed nondisplaced fracture of fourth metatarsal bone of right foot, initial encounter  Closed nondisplaced fracture of fifth metatarsal bone of right foot, initial encounter    Rx / DC Orders ED Discharge Orders          Ordered    ibuprofen  (ADVIL ) 600 MG tablet  Every 6 hours PRN        11/25/23 2253    oxyCODONE -acetaminophen  (PERCOCET/ROXICET) 5-325 MG tablet  Every 6 hours PRN        11/25/23 2253              Lova Urbieta, MD 11/25/23 2306

## 2023-11-25 NOTE — ED Triage Notes (Signed)
 Pt to ED from home with c/o falling, missed step and landed wrong on top of right foot is hurting now

## 2023-11-27 ENCOUNTER — Inpatient Hospital Stay: Payer: Managed Care, Other (non HMO) | Attending: Hematology

## 2023-11-27 VITALS — BP 129/83 | HR 83 | Temp 98.0°F | Resp 18

## 2023-11-27 DIAGNOSIS — E559 Vitamin D deficiency, unspecified: Secondary | ICD-10-CM | POA: Insufficient documentation

## 2023-11-27 DIAGNOSIS — Z800 Family history of malignant neoplasm of digestive organs: Secondary | ICD-10-CM | POA: Diagnosis not present

## 2023-11-27 DIAGNOSIS — Z808 Family history of malignant neoplasm of other organs or systems: Secondary | ICD-10-CM | POA: Diagnosis not present

## 2023-11-27 DIAGNOSIS — F17210 Nicotine dependence, cigarettes, uncomplicated: Secondary | ICD-10-CM | POA: Insufficient documentation

## 2023-11-27 DIAGNOSIS — Z170 Estrogen receptor positive status [ER+]: Secondary | ICD-10-CM | POA: Diagnosis not present

## 2023-11-27 DIAGNOSIS — Z95828 Presence of other vascular implants and grafts: Secondary | ICD-10-CM

## 2023-11-27 DIAGNOSIS — Z803 Family history of malignant neoplasm of breast: Secondary | ICD-10-CM | POA: Diagnosis not present

## 2023-11-27 DIAGNOSIS — Z79810 Long term (current) use of selective estrogen receptor modulators (SERMs): Secondary | ICD-10-CM | POA: Diagnosis not present

## 2023-11-27 DIAGNOSIS — G629 Polyneuropathy, unspecified: Secondary | ICD-10-CM | POA: Insufficient documentation

## 2023-11-27 DIAGNOSIS — C50112 Malignant neoplasm of central portion of left female breast: Secondary | ICD-10-CM | POA: Diagnosis present

## 2023-11-27 LAB — COMPREHENSIVE METABOLIC PANEL WITH GFR
ALT: 79 U/L — ABNORMAL HIGH (ref 0–44)
AST: 65 U/L — ABNORMAL HIGH (ref 15–41)
Albumin: 3.6 g/dL (ref 3.5–5.0)
Alkaline Phosphatase: 66 U/L (ref 38–126)
Anion gap: 10 (ref 5–15)
BUN: 11 mg/dL (ref 6–20)
CO2: 24 mmol/L (ref 22–32)
Calcium: 8.5 mg/dL — ABNORMAL LOW (ref 8.9–10.3)
Chloride: 101 mmol/L (ref 98–111)
Creatinine, Ser: 0.69 mg/dL (ref 0.44–1.00)
GFR, Estimated: >60 mL/min (ref >60)
Glucose, Bld: 133 mg/dL — ABNORMAL HIGH (ref 70–99)
Potassium: 3.4 mmol/L — ABNORMAL LOW (ref 3.5–5.1)
Sodium: 135 mmol/L (ref 135–145)
Total Bilirubin: 0.7 mg/dL (ref 0.0–1.2)
Total Protein: 6.5 g/dL (ref 6.5–8.1)

## 2023-11-27 LAB — CBC WITH DIFFERENTIAL/PLATELET
Abs Immature Granulocytes: 0.02 10*3/uL (ref 0.00–0.07)
Basophils Absolute: 0 10*3/uL (ref 0.0–0.1)
Basophils Relative: 0 %
Eosinophils Absolute: 0.3 10*3/uL (ref 0.0–0.5)
Eosinophils Relative: 4 %
HCT: 41.6 % (ref 36.0–46.0)
Hemoglobin: 14 g/dL (ref 12.0–15.0)
Immature Granulocytes: 0 %
Lymphocytes Relative: 17 %
Lymphs Abs: 1.2 10*3/uL (ref 0.7–4.0)
MCH: 29.1 pg (ref 26.0–34.0)
MCHC: 33.7 g/dL (ref 30.0–36.0)
MCV: 86.5 fL (ref 80.0–100.0)
Monocytes Absolute: 0.3 10*3/uL (ref 0.1–1.0)
Monocytes Relative: 5 %
Neutro Abs: 5.3 10*3/uL (ref 1.7–7.7)
Neutrophils Relative %: 74 %
Platelets: 208 10*3/uL (ref 150–400)
RBC: 4.81 MIL/uL (ref 3.87–5.11)
RDW: 13.3 % (ref 11.5–15.5)
WBC: 7.1 10*3/uL (ref 4.0–10.5)
nRBC: 0 % (ref 0.0–0.2)

## 2023-11-27 MED ORDER — SODIUM CHLORIDE FLUSH 0.9 % IV SOLN
10.0000 mL | Freq: Once | INTRAVENOUS | Status: AC
  Administered 2023-11-27: 10 mL via INTRAVENOUS
  Filled 2023-11-27: qty 10

## 2023-11-27 MED ORDER — HEPARIN SOD (PORK) LOCK FLUSH 100 UNIT/ML IV SOLN
500.0000 [IU] | Freq: Once | INTRAVENOUS | Status: AC
  Administered 2023-11-27: 500 [IU] via INTRAVENOUS

## 2023-11-27 NOTE — Progress Notes (Signed)
 Port flushed with good blood return noted. No bruising or swelling at site. Bandaid applied and patient discharged in satisfactory condition. VVS stable with no signs or symptoms of distressed noted.

## 2023-11-27 NOTE — Patient Instructions (Signed)
 [facility] [place] - A DEPT OF [place]  Discharge Instructions: Thank you for choosing [facility] to provide your oncology and hematology care.  If you have a lab appointment with the Cancer Center - please note that after April 8th, 2024, all labs will be drawn in the cancer center.  You do not have to check in or register with the main entrance as you have in the past but will complete your check-in in the cancer center.  Wear comfortable clothing and clothing appropriate for easy access to any Portacath or PICC line.   We strive to give you quality time with your provider. You may need to reschedule your appointment if you arrive late (15 or more minutes).  Arriving late affects you and other patients whose appointments are after yours.  Also, if you miss three or more appointments without notifying the office, you may be dismissed from the clinic at the provider's discretion.      For prescription refill requests, have your pharmacy contact our office and allow 72 hours for refills to be completed.    Today you received the following labs drawn, return as scheduled.   To help prevent nausea and vomiting after your treatment, we encourage you to take your nausea medication as directed.  BELOW ARE SYMPTOMS THAT SHOULD BE REPORTED IMMEDIATELY: *FEVER GREATER THAN 100.4 F (38 C) OR HIGHER *CHILLS OR SWEATING *NAUSEA AND VOMITING THAT IS NOT CONTROLLED WITH YOUR NAUSEA MEDICATION *UNUSUAL SHORTNESS OF BREATH *UNUSUAL BRUISING OR BLEEDING *URINARY PROBLEMS (pain or burning when urinating, or frequent urination) *BOWEL PROBLEMS (unusual diarrhea, constipation, pain near the anus) TENDERNESS IN MOUTH AND THROAT WITH OR WITHOUT PRESENCE OF ULCERS (sore throat, sores in mouth, or a toothache) UNUSUAL RASH, SWELLING OR PAIN  UNUSUAL VAGINAL DISCHARGE OR ITCHING   Items with * indicate a potential emergency and should be followed up as soon as possible  or go to the Emergency Department if any problems should occur.  Please show the CHEMOTHERAPY ALERT CARD or IMMUNOTHERAPY ALERT CARD at check-in to the Emergency Department and triage nurse.  Should you have questions after your visit or need to cancel or reschedule your appointment, please contact [facility] [place] - A DEPT OF [facility] [phone]  and follow the prompts.  Office hours are 8:00 a.m. to 4:30 p.m. Monday - Friday. Please note that voicemails left after 4:00 p.m. may not be returned until the following business day.  We are closed weekends and major holidays. You have access to a nurse at all times for urgent questions. Please call the main number to the clinic [phone] and follow the prompts.  For any non-urgent questions, you may also contact your provider using MyChart. We now offer e-Visits for anyone 95 and older to request care online for non-urgent symptoms. For details visit [email or web address].   Also download the MyChart app! Go to the app store, search "MyChart", open the app, select [place], and log in with your MyChart username and password.

## 2023-11-28 LAB — ESTRADIOL: Estradiol: 14 pg/mL

## 2023-11-28 LAB — LUTEINIZING HORMONE: LH: 10.5 m[IU]/mL

## 2023-11-28 LAB — FOLLICLE STIMULATING HORMONE: FSH: 16.4 m[IU]/mL

## 2023-11-29 ENCOUNTER — Encounter: Payer: Self-pay | Admitting: Hematology

## 2023-11-29 ENCOUNTER — Ambulatory Visit: Admitting: Orthopedic Surgery

## 2023-11-29 ENCOUNTER — Encounter: Payer: Self-pay | Admitting: Orthopedic Surgery

## 2023-11-29 DIAGNOSIS — S92344A Nondisplaced fracture of fourth metatarsal bone, right foot, initial encounter for closed fracture: Secondary | ICD-10-CM | POA: Diagnosis not present

## 2023-11-29 DIAGNOSIS — S92354A Nondisplaced fracture of fifth metatarsal bone, right foot, initial encounter for closed fracture: Secondary | ICD-10-CM

## 2023-11-29 MED ORDER — OXYCODONE HCL 5 MG PO TABS: 5.0000 mg | ORAL_TABLET | Freq: Four times a day (QID) | ORAL | 0 refills | Status: AC | PRN

## 2023-11-29 NOTE — Progress Notes (Signed)
 New Patient Visit  Assessment: Diana Jensen is a 48 y.o. female with the following: 1. Closed nondisplaced fracture of fourth metatarsal bone of right foot, initial encounter 2. Closed nondisplaced fracture of fifth metatarsal bone of right foot, initial encounter  Plan: Diana Jensen slipped and fell a few days ago.  She injured her right foot.  She has minimally displaced fractures of the right 4th and 5th metatarsal shafts.  She was given a cam boot in the emergency department.  Okay to bear weight through her heel.  I provided her with updated prescription for pain medication.  Elevate the foot to help with swelling.  She can also take Tylenol , ibuprofen  and use ice to help with swelling.  I would like see her back in 2 weeks for repeat evaluation, including x-rays.  Follow-up: Return in about 2 weeks (around 12/13/2023).  Subjective:  Chief Complaint  Patient presents with   Fracture    R foot 4th and 5th metatarsal DOI 11/25/23    History of Present Illness: Diana Jensen is a 48 y.o. female who presents for evaluation of right foot pain.  A few days ago, she missed a step, and landed on her right foot.  She had immediate pain.  She presented to the emergency department.  Radiographs demonstrated multiple fractures.  She was placed in a walking boot.  She was given pain medications.  She has tolerated this well.  She has been putting weight on limited portion of her foot.  No numbness or tingling.  No additional injuries.  Of note, she is just recently completed treatment for cancer.   Review of Systems: No fevers or chills No numbness or tingling No chest pain No shortness of breath No bowel or bladder dysfunction No GI distress No headaches   Medical History:  Past Medical History:  Diagnosis Date   Allergy    seasonal   Antiphospholipid syndrome (HCC)    Anxiety    Arthritis    deterioration of spine, knees   Carpal tunnel syndrome of right wrist    Clotting  disorder (HCC)    antiphospholipid syndrome   Depression    Encounter for general adult medical examination with abnormal findings 07/03/2022   Essential hypertension, benign 02/10/2020   Hyperlipidemia 08/02/2022   Sleep disorder 02/22/2016    Past Surgical History:  Procedure Laterality Date   BREAST BIOPSY Left 08/10/2022   US  LT BREAST BX W LOC DEV 1ST LESION IMG BX SPEC US  GUIDE 08/10/2022 [facility]   BREAST BIOPSY Left 08/18/2022   MM LT BREAST BX W LOC DEV EA AD LESION IMG BX SPEC STEREO GUIDE 08/18/2022 [facility]   BREAST BIOPSY Left 08/18/2022   MM LT BREAST BX W LOC DEV 1ST LESION IMAGE BX SPEC STEREO GUIDE 08/18/2022 [facility]   CARPAL TUNNEL RELEASE Right 01/09/2019   Procedure: RIGHT CARPAL TUNNEL RELEASE;  Surgeon: Darrin Emerald, MD;  Location: [facility];  Service: Orthopedics;  Laterality: Right;   DILATION AND CURETTAGE OF UTERUS     x2   EYE SURGERY     lazy eye   IRRIGATION AND DEBRIDEMENT HEMATOMA Left 10/05/2022   Procedure: HEMATOMA EVACUATION OF LEFT BREAST WITH JP DRAIN PLACEMENT;  Surgeon: Awilda Bogus, MD;  Location: [facility];  Service: General;  Laterality: Left;   PARTIAL HYSTERECTOMY  2011   PORTACATH PLACEMENT Right 10/05/2022   Procedure: INSERTION PORT-A-CATH;  Surgeon: Awilda Bogus, MD;  Location: [facility];  Service:  General;  Laterality: Right;   SIMPLE MASTECTOMY WITH AXILLARY SENTINEL NODE BIOPSY Left 08/28/2022   Procedure: SIMPLE MASTECTOMY WITH AXILLARY SENTINEL NODE BIOPSY;  Surgeon: Awilda Bogus, MD;  Location: [facility];  Service: General;  Laterality: Left;    Family History  Problem Relation Age of Onset   Breast cancer Mother 32   Arthritis Mother    Hypertension Mother    Osteoporosis Mother    Thyroid disease Father    Hypertension Father    Skin cancer Father    Mental illness Sister        depression ? bipolar   Dementia Maternal Grandmother    Prostate cancer Maternal Grandfather         prostate   Heart disease Paternal Grandmother    Colon cancer Paternal Grandfather    Mental illness Daughter    Pancreatic cancer Cousin        maternal cousin   Cancer Cousin        paternal, unknown type   Cancer Cousin        paternal, unknown type   Cancer - Lung Neg Hx    Cervical cancer Neg Hx    Social History   Tobacco Use   Smoking status: Every Day    Current packs/day: 0.50    Average packs/day: 0.5 packs/day for 28.0 years (14.0 ttl pk-yrs)    Types: Cigarettes   Smokeless tobacco: Never   Tobacco comments:    She has been smoking since age 69, smokes half a pack daily,was doing one pack a day until last year   Vaping Use   Vaping status: Never Used  Substance Use Topics   Alcohol use: No   Drug use: No    No Known Allergies  Current Meds  Medication Sig   acetaminophen  (TYLENOL ) 500 MG tablet Take 1,000 mg by mouth every 6 (six) hours as needed for headache.   Cholecalciferol (VITAMIN D -3) 25 MCG (1000 UT) CAPS Take 1 capsule by mouth daily.   escitalopram  (LEXAPRO ) 10 MG tablet Take 1 tablet (10 mg total) by mouth daily.   hydrOXYzine  (VISTARIL ) 50 MG capsule Take 1 capsule (50 mg total) by mouth daily as needed for anxiety.   ibuprofen  (ADVIL ) 600 MG tablet Take 1 tablet (600 mg total) by mouth every 6 (six) hours as needed.   losartan  (COZAAR ) 25 MG tablet Take 1 tablet (25 mg total) by mouth daily.   oxyCODONE  (ROXICODONE ) 5 MG immediate release tablet Take 1 tablet (5 mg total) by mouth every 6 (six) hours as needed for up to 7 days.   prochlorperazine  (COMPAZINE ) 10 MG tablet Take 1 tablet (10 mg total) by mouth every 6 (six) hours as needed.   tamoxifen  (NOLVADEX ) 20 MG tablet Take 1 tablet (20 mg total) by mouth daily.    Objective: There were no vitals taken for this visit.  Physical Exam:  General: Alert and oriented., No acute distress., and Seated in a wheelchair. Gait: Unable to ambulate.  Evaluation of the right foot demonstrates  diffuse swelling and bruising to the dorsal and lateral aspect of the foot.  There is tenderness to palpation in this area.  She has decreased sensation over the dorsum of the foot, and within the first webspace.  She can dorsiflex her great toe, although this is painful.  She is able to dorsiflex her ankle.  Toes warm and well-perfused.   IMAGING: I personally reviewed images previously obtained from the ED  Minimally displaced  fractures of the right 4th and 5th metatarsal shafts.   New Medications:  Meds ordered this encounter  Medications   oxyCODONE  (ROXICODONE ) 5 MG immediate release tablet    Sig: Take 1 tablet (5 mg total) by mouth every 6 (six) hours as needed for up to 7 days.    Dispense:  20 tablet    Refill:  0      Tonita Frater, MD  11/29/2023 11:57 AM

## 2023-11-30 ENCOUNTER — Other Ambulatory Visit: Payer: Self-pay

## 2023-12-04 ENCOUNTER — Inpatient Hospital Stay (HOSPITAL_BASED_OUTPATIENT_CLINIC_OR_DEPARTMENT_OTHER): Payer: Managed Care, Other (non HMO) | Admitting: Hematology

## 2023-12-04 VITALS — BP 148/93 | HR 86 | Temp 99.5°F | Resp 20

## 2023-12-04 DIAGNOSIS — Z170 Estrogen receptor positive status [ER+]: Secondary | ICD-10-CM

## 2023-12-04 DIAGNOSIS — C50212 Malignant neoplasm of upper-inner quadrant of left female breast: Secondary | ICD-10-CM

## 2023-12-04 DIAGNOSIS — Z79899 Other long term (current) drug therapy: Secondary | ICD-10-CM | POA: Diagnosis not present

## 2023-12-04 DIAGNOSIS — C50112 Malignant neoplasm of central portion of left female breast: Secondary | ICD-10-CM | POA: Diagnosis not present

## 2023-12-04 MED ORDER — CLOTRIMAZOLE 1 % EX OINT: 1.0000 | TOPICAL_OINTMENT | Freq: Two times a day (BID) | CUTANEOUS | 1 refills | Status: DC

## 2023-12-04 NOTE — Patient Instructions (Signed)
 [facility] at [facility] Discharge Instructions   You were seen and examined today by Dr. Cheree Cords.  He reviewed the results of your lab work which are normal/stable.   Continue tamoxifen  as prescribed. We will refer you back to [facility] for yearly pap smears while you are on this medication.   We will see you back in 6 months. We will repeat lab work and a bone density prior to this visit.    Return as scheduled.    Thank you for choosing [facility] at [facility] to provide your oncology and hematology care.  To afford each patient quality time with our provider, please arrive at least 15 minutes before your scheduled appointment time.   If you have a lab appointment with the Cancer Center please come in thru the Main Entrance and check in at the main information desk.  You need to re-schedule your appointment should you arrive 10 or more minutes late.  We strive to give you quality time with our providers, and arriving late affects you and other patients whose appointments are after yours.  Also, if you no show three or more times for appointments you may be dismissed from the clinic at the providers discretion.     Again, thank you for choosing [facility].  Our hope is that these requests will decrease the amount of time that you wait before being seen by our physicians.       _____________________________________________________________  Should you have questions after your visit to [facility], please contact our office at [phone] and follow the prompts.  Our office hours are 8:00 a.m. and 4:30 p.m. Monday - Friday.  Please note that voicemails left after 4:00 p.m. may not be returned until the following business day.  We are closed weekends and major holidays.  You do have access to a nurse 24-7, just call the main number to the clinic [phone] and do not press any options, hold on the line and a  nurse will answer the phone.    For prescription refill requests, have your pharmacy contact our office and allow 72 hours.    Due to Covid, you will need to wear a mask upon entering the hospital. If you do not have a mask, a mask will be given to you at the Main Entrance upon arrival. For doctor visits, patients may have 1 support person age 12 or older with them. For treatment visits, patients can not have anyone with them due to social distancing guidelines and our immunocompromised population.

## 2023-12-04 NOTE — Progress Notes (Signed)
 [facility] [address] [place] [address]    Clinic Day:  12/04/2023  Referring physician: Tobi Fortes, MD  Patient Care Team: Diana Fortes, MD as PCP - General (Internal Medicine) Diana Boros, MD as Consulting Physician (Hematology)   ASSESSMENT & PLAN:   Assessment: 1.  T1cN1 G2 left breast IDC, ER/PR positive, HER2 negative: - She felt a lump in her breast in December 2023. - Left breast subareolar mass at 9:00 biopsy (08/10/2022): grade 2 IDC, ER 95% strong intensity, PR 90% strong intensity, Ki-67 10%, HER2 1+ - Left mastectomy and SLNB (08/28/2022): 1.6 x 1.2 x 0.7 cm IDC, margins negative, LVI present, 1/1 lymph node with metastatic carcinoma, metastasis 15 mm, focal extracapsular extension.  PT1 cpN1a. 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  No prior history of arterial/venous thrombosis/strokes. - PET scan (10/19/2022): No evidence of hypermetabolic metastatic disease.  Diffuse hepatic steatosis. - 4 cycles of adjuvant AC from 10/25/2022 through 12/12/2022.  Weekly paclitaxel  started on 12/27/2022, completed on 03/15/2023 - Germline mutation testing: Negative - Tamoxifen  20 mg daily started on 04/05/2023 - XRT to the left chest wall to start on 04/16/2023, completed on 06/01/2023   2.  Social/family history: - Seen today with her husband and daughter.  She worked as a Conservation officer, nature.  Current active smoker, 5 to 7 cigarettes/day, started at age 80. - Mother had breast cancer at age 52.  Father had skin cancer.  Paternal grandfather had colon cancer.  Maternal grandfather had prostate cancer.  1 maternal cousin had pancreatic cancer.  2 paternal cousins have cancer.    Plan: 1.  T1cN1 G2 left breast IDC, ER/PR positive, HER2 negative: - She is tolerating tamoxifen  reasonably well when she takes it at nighttime.  She rarely gets nausea and uses Compazine . - Physical exam: At the left mastectomy site, there is oval plaque measuring  about 3 cm in transverse diameter, with thickening of the skin.  She reported seeing this about 4 weeks ago and associated with itching.  Differential diagnosis includes fungal skin infection.  Will try clotrimazole ointment twice daily.  She will call us  back if the lesion does not go away in the next couple of weeks. - She reported vaginal spotting and occasional discharge.  She has not had Pap smear lately. - Reviewed labs from 11/27/2023: Normal CBC.  LFTs show elevated AST and ALT which are stable from fatty liver. - Reviewed right breast mammogram from 08/13/2023: BI-RADS Category 1. - She was recommended to see GYN for Pap smear as soon as possible as tamoxifen  can cause endometrial thickening and rarely dysplasia/malignancy.  She will also continue Pap smears once a year while on tamoxifen . - She will continue tamoxifen  in the interim.  RTC 4 months for follow-up.   2.  Peripheral neuropathy: - Constant numbness in the fingertips and toes is stable.  No medical intervention necessary.   3.  Low vitamin D  levels: - She will continue vitamin D  1000 units daily.  Last vitamin D  level is 37. - She broke her right foot after fall.  We will get bone density test prior to next visit.    Orders Placed This Encounter  Procedures   DG Bone Density    Standing Status:   Future    Expiration Date:   12/03/2024    Reason for Exam (SYMPTOM  OR DIAGNOSIS REQUIRED):   antiestrogen therapy    Is patient pregnant?:  No    Preferred imaging location?:   [facility]   CBC with Differential    Standing Status:   Future    Expected Date:   06/02/2024    Expiration Date:   12/03/2024   Comprehensive metabolic panel    Standing Status:   Future    Expected Date:   06/02/2024    Expiration Date:   12/03/2024      Diana [facility],acting as a scribe for Diana Boros, MD.,have documented all relevant documentation on the behalf of Diana Boros, MD,as directed by  Diana Boros,  MD while in the presence of Diana Boros, MD.  I, Diana Boros MD, have reviewed the above documentation for accuracy and completeness, and I agree with the above.    Diana Boros, MD   6/10/20253:29 PM  CHIEF COMPLAINT:   Diagnosis: left breast cancer    Cancer Staging  Breast cancer of upper-inner quadrant of left female breast [facility]) Staging form: Breast, AJCC 8th Edition - Clinical stage from 10/01/2022: Stage IB (cT1c, cN1, cM0, G2, ER+, PR+, HER2-) - Signed by Diana Boros, MD on 10/01/2022    Prior Therapy: 1. left mastectomy and +SLNB with Dr. Deena Jensen on 08/28/22  2. Adjuvant AC, 4 cycles, 10/25/22 - 12/12/22 3. weekly paclitaxel , 12/27/22 - 03/15/23 4. XRT to left chest wall, 04/18/23 - 06/01/23  Current Therapy:  tamoxifen    HISTORY OF PRESENT ILLNESS:   Oncology History  Breast cancer of upper-inner quadrant of left female breast (HCC)  10/01/2022 Initial Diagnosis   Breast cancer of upper-inner quadrant of left female breast   10/01/2022 Cancer Staging   Staging form: Breast, AJCC 8th Edition - Clinical stage from 10/01/2022: Stage IB (cT1c, cN1, cM0, G2, ER+, PR+, HER2-) - Signed by Diana Boros, MD on 10/01/2022 Histopathologic type: Infiltrating duct carcinoma, NOS Stage prefix: Initial diagnosis Nuclear grade: G2 Histologic grading system: 3 grade system   10/25/2022 -  Chemotherapy   Patient is on Treatment Plan : BREAST ADJUVANT DOSE DENSE AC q14d / PACLitaxel  q7d      Genetic Testing   No pathogenic variants identified on the [facility] Common Hereditary Cancers+RNA panel. VUS in PDGFRA called c.502G>C identified. The report date is 01/16/2023.  The Common Hereditary Cancers Panel + RNA offered by [facility] includes sequencing and/or deletion duplication testing of the following 48 genes: APC*, ATM*, AXIN2, BAP1, BARD1, BMPR1A, BRCA1, BRCA2, BRIP1, CDH1, CDK4, CDKN2A (p14ARF), CDKN2A (p16INK4a), CHEK2, CTNNA1, DICER1*, EPCAM*, FH*,  GREM1*, HOXB13, KIT, MBD4, MEN1*, MLH1*, MSH2*, MSH3*, MSH6*, MUTYH, NF1*, NTHL1, PALB2, PDGFRA, PMS2*, POLD1*, POLE, PTEN*, RAD51C, RAD51D, SDHA*, SDHB, SDHC*, SDHD, SMAD4, SMARCA4, STK11, TP53, TSC1*, TSC2, VHL.       INTERVAL HISTORY:   Diana Jensen is a 48 y.o. female presenting to clinic today for follow up of left breast cancer. She was last seen by me on 08/06/23.  Since her last visit, she presented to the ED on 11/25/23 for a closed nondisplaced fracture of the 4th and 5th metatarsals of the right foot after a fall.   Today, she states that she is doing well overall. Her appetite level is at 100%. Her energy level is at 75%. Darlisha is accompanied by a family member.   She reports mild nausea with Tamoxifen  that resolves when lying still. She has occasionally required compazine , though not in the past 2-3 months.  Maison reports vaginal spotting and occasional vaginal discharge. She has not visited gynecology since her hysterectomy.   She notes stable numbness  in the fingertips. Her mother had osteoporosis. Toshi is taking Vitamin D  as prescribed.   Carola reports an erythematous, itching, patchy area above the left mastectomy scar that is tender to the touch and appeared 1 month ago. She has used cream given to her by radiation when receiving radiation therapy and Gold bond's cream.   PAST MEDICAL HISTORY:   Past Medical History: Past Medical History:  Diagnosis Date   Allergy    seasonal   Antiphospholipid syndrome (HCC)    Anxiety    Arthritis    deterioration of spine, knees   Carpal tunnel syndrome of right wrist    Clotting disorder (HCC)    antiphospholipid syndrome   Depression    Encounter for general adult medical examination with abnormal findings 07/03/2022   Essential hypertension, benign 02/10/2020   Hyperlipidemia 08/02/2022   Sleep disorder 02/22/2016    Surgical History: Past Surgical History:  Procedure Laterality Date   BREAST BIOPSY Left 08/10/2022    US  LT BREAST BX W LOC DEV 1ST LESION IMG BX SPEC US  GUIDE 08/10/2022 [facility]   BREAST BIOPSY Left 08/18/2022   MM LT BREAST BX W LOC DEV EA AD LESION IMG BX SPEC STEREO GUIDE 08/18/2022 [facility]   BREAST BIOPSY Left 08/18/2022   MM LT BREAST BX W LOC DEV 1ST LESION IMAGE BX SPEC STEREO GUIDE 08/18/2022 [facility]   CARPAL TUNNEL RELEASE Right 01/09/2019   Procedure: RIGHT CARPAL TUNNEL RELEASE;  Surgeon: Darrin Emerald, MD;  Location: [facility];  Service: Orthopedics;  Laterality: Right;   DILATION AND CURETTAGE OF UTERUS     x2   EYE SURGERY     lazy eye   IRRIGATION AND DEBRIDEMENT HEMATOMA Left 10/05/2022   Procedure: HEMATOMA EVACUATION OF LEFT BREAST WITH JP DRAIN PLACEMENT;  Surgeon: Awilda Bogus, MD;  Location: [facility];  Service: General;  Laterality: Left;   PARTIAL HYSTERECTOMY  2011   PORTACATH PLACEMENT Right 10/05/2022   Procedure: INSERTION PORT-A-CATH;  Surgeon: Awilda Bogus, MD;  Location: [facility];  Service: General;  Laterality: Right;   SIMPLE MASTECTOMY WITH AXILLARY SENTINEL NODE BIOPSY Left 08/28/2022   Procedure: SIMPLE MASTECTOMY WITH AXILLARY SENTINEL NODE BIOPSY;  Surgeon: Awilda Bogus, MD;  Location: [facility];  Service: General;  Laterality: Left;    Social History: Social History   Socioeconomic History   Marital status: Married    Spouse name: Not on file   Number of children: 2   Years of education: Not on file   Highest education level: Not on file  Occupational History   Not on file  Tobacco Use   Smoking status: Every Day    Current packs/day: 0.50    Average packs/day: 0.5 packs/day for 28.0 years (14.0 ttl pk-yrs)    Types: Cigarettes   Smokeless tobacco: Never   Tobacco comments:    She has been smoking since age 41, smokes half a pack daily,was doing one pack a day until last year   Vaping Use   Vaping status: Never Used  Substance and Sexual Activity   Alcohol use: No   Drug use: No   Sexual activity:  Yes    Birth control/protection: Surgical  Other Topics Concern   Not on file  Social History Narrative   Associates degree in Engineer, site. 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 Has 2 children 1 son and 1 daughter. Married x 15 years.   Social Drivers of Health  Financial Resource Strain: High Risk (11/09/2022)   Overall Financial Resource Strain (CARDIA)    Difficulty of Paying Living Expenses: Hard  Food Insecurity: No Food Insecurity (08/30/2022)   Hunger Vital Sign    Worried About Running Out of Food in the Last Year: Never true    Ran Out of Food in the Last Year: Never true  Transportation Needs: No Transportation Needs (08/30/2022)   PRAPARE - Administrator, Civil Service (Medical): No    Lack of Transportation (Non-Medical): No  Physical Activity: Not on file  Stress: Not on file  Social Connections: Not on file  Intimate Partner Violence: Not At Risk (08/30/2022)   Humiliation, Afraid, Rape, and Kick questionnaire    Fear of Current or Ex-Partner: No    Emotionally Abused: No    Physically Abused: No    Sexually Abused: No    Family History: Family History  Problem Relation Age of Onset   Breast cancer Mother 63   Arthritis Mother    Hypertension Mother    Osteoporosis Mother    Thyroid disease Father    Hypertension Father    Skin cancer Father    Mental illness Sister        depression ? bipolar   Dementia Maternal Grandmother    Prostate cancer Maternal Grandfather        prostate   Heart disease Paternal Grandmother    Colon cancer Paternal Grandfather    Mental illness Daughter    Pancreatic cancer Cousin        maternal cousin   Cancer Cousin        paternal, unknown type   Cancer Cousin        paternal, unknown type   Cancer - Lung Neg Hx    Cervical cancer Neg Hx     Current Medications:  Current Outpatient Medications:    Clotrimazole 1 % OINT, Apply 1 Application topically 2 (two) times daily. Apply  topically to affected area twice a day, Disp: 30 g, Rfl: 1   acetaminophen  (TYLENOL ) 500 MG tablet, Take 1,000 mg by mouth every 6 (six) hours as needed for headache., Disp: , Rfl:    Cholecalciferol (VITAMIN D -3) 25 MCG (1000 UT) CAPS, Take 1 capsule by mouth daily., Disp: , Rfl:    escitalopram  (LEXAPRO ) 10 MG tablet, Take 1 tablet (10 mg total) by mouth daily., Disp: 90 tablet, Rfl: 3   hydrOXYzine  (VISTARIL ) 50 MG capsule, Take 1 capsule (50 mg total) by mouth daily as needed for anxiety., Disp: , Rfl:    ibuprofen  (ADVIL ) 600 MG tablet, Take 1 tablet (600 mg total) by mouth every 6 (six) hours as needed., Disp: 30 tablet, Rfl: 0   losartan  (COZAAR ) 25 MG tablet, Take 1 tablet (25 mg total) by mouth daily., Disp: 90 tablet, Rfl: 1   oxyCODONE  (ROXICODONE ) 5 MG immediate release tablet, Take 1 tablet (5 mg total) by mouth every 6 (six) hours as needed for up to 7 days., Disp: 20 tablet, Rfl: 0   prochlorperazine  (COMPAZINE ) 10 MG tablet, Take 1 tablet (10 mg total) by mouth every 6 (six) hours as needed., Disp: 60 tablet, Rfl: 3   tamoxifen  (NOLVADEX ) 20 MG tablet, Take 1 tablet (20 mg total) by mouth daily., Disp: 90 tablet, Rfl: 3   Allergies: No Known Allergies  REVIEW OF SYSTEMS:   Review of Systems  Constitutional:  Negative for chills, fatigue and fever.  HENT:   Negative for lump/mass, mouth sores,  nosebleeds, sore throat and trouble swallowing.   Eyes:  Negative for eye problems.  Respiratory:  Negative for cough and shortness of breath.   Cardiovascular:  Negative for chest pain, leg swelling and palpitations.  Gastrointestinal:  Positive for nausea. Negative for abdominal pain, constipation, diarrhea and vomiting.  Genitourinary:  Positive for vaginal discharge. Negative for bladder incontinence, difficulty urinating, dysuria, frequency, hematuria and nocturia.        +vaginal spotting  Musculoskeletal:  Negative for arthralgias, back pain, flank pain, myalgias and neck pain.   Skin:  Negative for itching and rash.  Neurological:  Positive for numbness (and tingling in hands and feet). Negative for dizziness and headaches.  Hematological:  Does not bruise/bleed easily.  Psychiatric/Behavioral:  Negative for depression, sleep disturbance and suicidal ideas. The patient is not nervous/anxious.   All other systems reviewed and are negative.    VITALS:   Blood pressure (!) 148/93, pulse 86, temperature 99.5 F (37.5 C), temperature source Tympanic, resp. rate 20, SpO2 97%.  Wt Readings from Last 3 Encounters:  11/25/23 250 lb (113.4 kg)  08/06/23 266 lb 12.1 oz (121 kg)  07/31/23 265 lb (120.2 kg)    There is no height or weight on file to calculate BMI.  Performance status (ECOG): 0 - Asymptomatic  PHYSICAL EXAM:   Physical Exam Vitals and nursing note reviewed. Exam conducted with a chaperone present.  Constitutional:      Appearance: Normal appearance.  Cardiovascular:     Rate and Rhythm: Normal rate and regular rhythm.     Pulses: Normal pulses.     Heart sounds: Normal heart sounds.  Pulmonary:     Effort: Pulmonary effort is normal.     Breath sounds: Normal breath sounds.  Chest:     Comments: +oval patchy area with skin thickening measuring 3 cm in longest diameter above the left mastectomy scar Abdominal:     Palpations: Abdomen is soft. There is no hepatomegaly, splenomegaly or mass.     Tenderness: There is no abdominal tenderness.  Musculoskeletal:     Right lower leg: No edema.     Left lower leg: No edema.  Lymphadenopathy:     Cervical: No cervical adenopathy.     Right cervical: No superficial, deep or posterior cervical adenopathy.    Left cervical: No superficial, deep or posterior cervical adenopathy.     Upper Body:     Right upper body: No supraclavicular or axillary adenopathy.     Left upper body: No supraclavicular or axillary adenopathy.  Neurological:     General: No focal deficit present.     Mental Status: She is  alert and oriented to person, place, and time.  Psychiatric:        Mood and Affect: Mood normal.        Behavior: Behavior normal.   Breast Exam Chaperone: Donzell Gallery, RN   LABS:   CBC     Component Value Date/Time   WBC 7.1 11/27/2023 1331   RBC 4.81 11/27/2023 1331   HGB 14.0 11/27/2023 1331   HGB 14.7 07/03/2022 1356   HCT 41.6 11/27/2023 1331   HCT 42.1 07/03/2022 1356   PLT 208 11/27/2023 1331   PLT 307 07/03/2022 1356   MCV 86.5 11/27/2023 1331   MCV 82 07/03/2022 1356   MCV 83 03/31/2013 1433   MCH 29.1 11/27/2023 1331   MCHC 33.7 11/27/2023 1331   RDW 13.3 11/27/2023 1331   RDW 13.3 07/03/2022 1356  RDW 13.6 03/31/2013 1433   LYMPHSABS 1.2 11/27/2023 1331   LYMPHSABS 2.2 07/03/2022 1356   MONOABS 0.3 11/27/2023 1331   EOSABS 0.3 11/27/2023 1331   EOSABS 0.4 07/03/2022 1356   BASOSABS 0.0 11/27/2023 1331   BASOSABS 0.1 07/03/2022 1356    CMP      Component Value Date/Time   NA 135 11/27/2023 1331   NA 142 07/03/2022 1356   NA 139 03/31/2013 1433   K 3.4 (L) 11/27/2023 1331   K 3.4 (L) 03/31/2013 1433   CL 101 11/27/2023 1331   CL 107 03/31/2013 1433   CO2 24 11/27/2023 1331   CO2 23 03/31/2013 1433   GLUCOSE 133 (H) 11/27/2023 1331   GLUCOSE 137 (H) 03/31/2013 1433   BUN 11 11/27/2023 1331   BUN 18 07/03/2022 1356   BUN 11 03/31/2013 1433   CREATININE 0.69 11/27/2023 1331   CREATININE 0.73 02/10/2020 1126   CALCIUM 8.5 (L) 11/27/2023 1331   CALCIUM 8.8 03/31/2013 1433   PROT 6.5 11/27/2023 1331   PROT 7.0 07/03/2022 1356   ALBUMIN 3.6 11/27/2023 1331   ALBUMIN 4.6 07/03/2022 1356   AST 65 (H) 11/27/2023 1331   ALT 79 (H) 11/27/2023 1331   ALKPHOS 66 11/27/2023 1331   BILITOT 0.7 11/27/2023 1331   BILITOT 0.4 07/03/2022 1356   GFRNONAA >60 11/27/2023 1331   GFRNONAA 102 02/10/2020 1126   GFRAA 119 02/10/2020 1126     No results found for: "CEA1", "CEA" / No results found for: "CEA1", "CEA" No results found for: "PSA1" No  results found for: "MVH846" No results found for: "CAN125"  No results found for: "TOTALPROTELP", "ALBUMINELP", "A1GS", "A2GS", "BETS", "BETA2SER", "GAMS", "MSPIKE", "SPEI" No results found for: "TIBC", "FERRITIN", "IRONPCTSAT" No results found for: "LDH"   STUDIES:   DG Foot Complete Right Result Date: 11/25/2023 EXAM: 3 or more VIEW(S) XRAY OF THE RIGHT FOOT 11/25/2023 10:32:00 PM COMPARISON: None available. CLINICAL HISTORY: Pain. Pt having pain on top of foot. FINDINGS: BONES AND JOINTS: Nondisplaced transverse fifth metatarsal neck fracture. Nondisplaced oblique fourth distal metatarsal shaft fracture. SOFT TISSUES: The soft tissues are unremarkable. IMPRESSION: 1. Nondisplaced transverse fifth metatarsal neck fracture. 2. Nondisplaced oblique fourth distal metatarsal shaft fracture. Electronically signed by: Zadie Herter MD 11/25/2023 10:34 PM EDT RP Workstation: [id number]   DG Ankle Complete Right Result Date: 11/25/2023 CLINICAL DATA:  Fall EXAM: RIGHT ANKLE - COMPLETE 3+ VIEW COMPARISON:  None Available. FINDINGS: There is no evidence of fracture, dislocation, or joint effusion. There is no evidence of arthropathy or other focal bone abnormality. There is lateral soft tissue swelling. IMPRESSION: Lateral soft tissue swelling without evidence of fracture or dislocation. Electronically Signed   By: Tyron Gallon M.D.   On: 11/25/2023 21:37

## 2023-12-05 ENCOUNTER — Other Ambulatory Visit: Payer: Self-pay

## 2023-12-14 ENCOUNTER — Encounter: Payer: Self-pay | Admitting: Orthopedic Surgery

## 2023-12-14 ENCOUNTER — Other Ambulatory Visit (INDEPENDENT_AMBULATORY_CARE_PROVIDER_SITE_OTHER): Payer: Self-pay

## 2023-12-14 ENCOUNTER — Ambulatory Visit: Admitting: Orthopedic Surgery

## 2023-12-14 DIAGNOSIS — S92344A Nondisplaced fracture of fourth metatarsal bone, right foot, initial encounter for closed fracture: Secondary | ICD-10-CM

## 2023-12-14 DIAGNOSIS — S92344D Nondisplaced fracture of fourth metatarsal bone, right foot, subsequent encounter for fracture with routine healing: Secondary | ICD-10-CM

## 2023-12-14 DIAGNOSIS — S92354D Nondisplaced fracture of fifth metatarsal bone, right foot, subsequent encounter for fracture with routine healing: Secondary | ICD-10-CM

## 2023-12-14 NOTE — Progress Notes (Signed)
 Return patient Visit  Assessment: Diana [facility] is a 48 y.o. female with the following: 1. Closed nondisplaced fracture of fourth metatarsal bone of right foot, subsequent encounter 2. Closed nondisplaced fracture of fifth metatarsal bone of right foot, subsequent encounter  Plan: Diana Jensen injured her right foot.  She has fractures of both the 4th and 5th metatarsals.  Radiographs remain stable.  She has some residual bruising and swelling.  Numbness to the third toe should resolve.  Continue to wear the boot.  Weightbearing as tolerated through the heel.  Crutches as needed.  Elevate the foot to help with swelling.  Follow-up in about 3 weeks. Follow-up: No follow-ups on file.  Subjective:  Chief Complaint  Patient presents with   Fracture    R foot 4th and 5th metatarsal DOI 11/25/23    History of Present Illness: Diana Jensen is a 48 y.o. female who returns for evaluation of right foot pain.  She slipped and fell, injuring her right foot.  This was approximately 3 weeks ago.  She has been wearing a boot.  She has tried bearing weight through her heel, but it sends pain shooting proximally.  As result, she continues to use crutches.  She has been sleeping with the boot on.  Review of Systems: No fevers or chills No numbness or tingling No chest pain No shortness of breath No bowel or bladder dysfunction No GI distress No headaches    Objective: There were no vitals taken for this visit.  Physical Exam:  General: Alert and oriented., No acute distress., and Seated in a wheelchair. Gait: Unable to ambulate.  Right foot with residual swelling.  There is some residual bruising.  Tenderness to palpation over the distal 5th and 4th metatarsal.  Decreased sensation to the third toe.  Toes warm well-perfused.  She does have some bruising visible on the plantar aspect of her foot.   IMAGING: I personally ordered and reviewed the following images  X-rays of the right foot  were obtained in clinic today.  These compared to prior x-rays.  Fractures of the distal 4th and 5th metatarsal shafts remain in stable alignment.  There has been no interval displacement.  No bony lesions.  No dislocations.  Impression: Stable right 4th and 5th metatarsal fractures   New Medications:  No orders of the defined types were placed in this encounter.     Tonita Frater, MD  12/14/2023 10:45 AM

## 2023-12-14 NOTE — Patient Instructions (Signed)
 Continue to wear the boot for support.  Okay to bear weight through your heel.  Elevate the foot to help with swelling  Follow-up in about 3 weeks

## 2024-01-08 ENCOUNTER — Telehealth: Payer: Self-pay | Admitting: *Deleted

## 2024-01-08 ENCOUNTER — Other Ambulatory Visit (INDEPENDENT_AMBULATORY_CARE_PROVIDER_SITE_OTHER): Payer: Self-pay

## 2024-01-08 ENCOUNTER — Encounter: Payer: Self-pay | Admitting: Orthopedic Surgery

## 2024-01-08 ENCOUNTER — Ambulatory Visit: Admitting: Orthopedic Surgery

## 2024-01-08 DIAGNOSIS — S92354D Nondisplaced fracture of fifth metatarsal bone, right foot, subsequent encounter for fracture with routine healing: Secondary | ICD-10-CM

## 2024-01-08 DIAGNOSIS — S92344D Nondisplaced fracture of fourth metatarsal bone, right foot, subsequent encounter for fracture with routine healing: Secondary | ICD-10-CM

## 2024-01-08 NOTE — Telephone Encounter (Signed)
 Surgical Date: 08/28/2022 Procedure: Simple Mastectomy with Axillary Sentinel Node Bx, Left  Surgical Date: 10/05/2022 Procedure: Port- A- Cath Insertion  Received call from patient [phone]~ telephone.   Patient requested order for prosthesis and bras to be sen to boutique in [place].   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

## 2024-01-08 NOTE — Progress Notes (Signed)
 Return patient Visit  Assessment: Diana Jensen is a 48 y.o. female with the following: 1. Closed nondisplaced fracture of fourth metatarsal bone of right foot, subsequent encounter 2. Closed nondisplaced fracture of fifth metatarsal bone of right foot, subsequent encounter  Plan: Diana Jensen injured her right foot.  She has fractures of both the 4th and 5th metatarsals.  Injury was 6 weeks ago.  Improved swelling.  No bruising.  Radiographs are stable.  Transition to a regular shoe.  Anticipate continued improvement in swelling, as well as the numbness she is experiencing to her lesser toes.  Follow-up in 1 month.  At that time, anticipate that she will be fully into a regular shoe.   Follow-up: Return in about 4 weeks (around 02/05/2024).  Subjective:  Chief Complaint  Patient presents with   Fracture    R foot 4th and 5th metatarsal DOI 11/25/23    History of Present Illness: Diana Jensen is a 48 y.o. female who returns for evaluation of right foot pain.  She injured her right foot, approximately 6 weeks ago.  She has been wearing a boot, bearing weight through the heel.  She has some residual swelling.  Pain is improved.  She does complain of some numbness to her lesser toes.   Review of Systems: No fevers or chills + numbness or tingling No chest pain No shortness of breath No bowel or bladder dysfunction No GI distress No headaches    Objective: There were no vitals taken for this visit.  Physical Exam:  General: Alert and oriented., No acute distress., and Seated in a wheelchair. Gait: Ambulates with the assistance of a cam walker  Right foot without bruising.  Mild residual swelling.  No tenderness to the 4th or 5th metatarsal.  Toes warm and well-perfused.  Sensation intact to the dorsum of the foot.  Decreased sensation to the dorsal and plantar aspect of the 3 lateral toes.   IMAGING: I personally ordered and reviewed the following images  X-rays of the  right foot were obtained in clinic today.  These were compared available x-rays.  Distal 4th and 5th metatarsal shaft fractures remain stable alignment.  No new injuries.  No dislocation.  There has been interval consolidation.  Impression: Stable right 4th and 5th metatarsal shaft fractures   New Medications:  No orders of the defined types were placed in this encounter.     Oneil DELENA Horde, MD  01/08/2024 4:56 PM

## 2024-01-08 NOTE — Patient Instructions (Addendum)
 Okay to transition to a regular shoe.  It is not uncommon to transition back and forth between the boot and the regular shoe over the next couple weeks.  Swelling is normal, can persist for several months following your injury.  Elevate the foot to help with swelling.  As long as the swelling improves when you get off your feet, there is nothing concerning.  Medications as needed  Follow-up in 1 month    Instructions  1.  You have sustained an ankle sprain, or similar exercises that can be treated as an ankle sprain.  **These exercises can also be used as part of recovery from an ankle fracture, or foot fracture.  2.  I encourage you to stay on your feet and gradually remove your walking boot.   3.  Below are some exercises that you can complete on your own to improve your symptoms.  4.  As an alternative, you can search for foot fracture exercises online, and can see some demonstrations on YouTube  5.  If you are having difficulty with these exercises, we can also prescribe formal physical therapy  Ankle Exercises Ask your health care provider which exercises are safe for you. Do exercises exactly as told by your health care provider and adjust them as directed. It is normal to feel mild stretching, pulling, tightness, or mild discomfort as you do these exercises. Stop right away if you feel sudden pain or your pain gets worse. Do not begin these exercises until told by your health care provider.  Stretching and range-of-motion exercises These exercises warm up your muscles and joints and improve the movement and flexibility of your ankle. These exercises may also help to relieve pain.  Dorsiflexion/plantar flexion  Sit with your R knee straight or bent. Do not rest your foot on anything. Flex your left ankle to tilt the top of your foot toward your shin. This is called dorsiflexion. Hold this position for 5 seconds. Point your toes downward to tilt the top of your foot away from your  shin. This is called plantar flexion. Hold this position for 5 seconds. Repeat 10 times. Complete this exercise 2-3 times a day.  As tolerated  Ankle alphabet  Sit with your R foot supported at your lower leg. Do not rest your foot on anything. Make sure your foot has room to move freely. Think of your R foot as a paintbrush: Move your foot to trace each letter of the alphabet in the air. Keep your hip and knee still while you trace the letters. Trace every letter from A to Z. Make the letters as large as you can without causing or increasing any discomfort.  Repeat 2-3 times. Complete this exercise 2-3 times a day.   Strengthening exercises These exercises build strength and endurance in your ankle. Endurance is the ability to use your muscles for a long time, even after they get tired. Dorsiflexors These are muscles that lift your foot up. Secure a rubber exercise band or tube to an object, such as a table leg, that will stay still when the band is pulled. Secure the other end around your R foot. Sit on the floor, facing the object with your R leg extended. The band or tube should be slightly tense when your foot is relaxed. Slowly flex your R ankle and toes to bring your foot toward your shin. Hold this position for 5 seconds. Slowly return your foot to the starting position, controlling the band as you do  that. Repeat 10 times. Complete this exercise 2-3 times a day.  Plantar flexors These are muscles that push your foot down. Sit on the floor with your R leg extended. Loop a rubber exercise band or tube around the ball of your R foot. The ball of your foot is on the walking surface, right under your toes. The band or tube should be slightly tense when your foot is relaxed. Slowly point your toes downward, pushing them away from you. Hold this position for 5 seconds. Slowly release the tension in the band or tube, controlling smoothly until your foot is back in the starting  position. Repeat 10 times. Complete this exercise 2-3 times a day.  Towel curls  Sit in a chair on a non-carpeted surface, and put your feet on the floor. Place a towel in front of your feet. Keeping your heel on the floor, put your R foot on the towel. Pull the towel toward you by grabbing the towel with your toes and curling them under. Keep your heel on the floor. Let your toes relax. Grab the towel again. Keep pulling the towel until it is completely underneath your foot. Repeat 10 times. Complete this exercise 2-3 times a day.  Standing plantar flexion This is an exercise in which you use your toes to lift your body's weight while standing. Stand with your feet shoulder-width apart. Keep your weight spread evenly over the width of your feet while you rise up on your toes. Use a wall or table to steady yourself if needed, but try not to use it for support. If this exercise is too easy, try these options: Shift your weight toward your R leg until you feel challenged. If told by your health care provider, lift your uninjured leg off the floor. Hold this position for 5 seconds. Repeat 10 times. Complete this exercise 2-3 times a day.  Tandem walking Stand with one foot directly in front of the other. Slowly raise your back foot up, lifting your heel before your toes, and place it directly in front of your other foot. Continue to walk in this heel-to-toe way. Have a countertop or wall nearby to use if needed to keep your balance, but try not to hold onto anything for support.  Repeat 10 times. Complete this exercise 2-3 times a day.   Document Revised: 03/09/2018 Document Reviewed: 03/11/2018 Elsevier Patient Education  2020 [employer].

## 2024-01-29 ENCOUNTER — Encounter (INDEPENDENT_AMBULATORY_CARE_PROVIDER_SITE_OTHER): Payer: Self-pay | Admitting: *Deleted

## 2024-01-29 ENCOUNTER — Encounter: Payer: Self-pay | Admitting: Hematology

## 2024-02-05 ENCOUNTER — Encounter: Admitting: Orthopedic Surgery

## 2024-02-11 ENCOUNTER — Encounter: Payer: Self-pay | Admitting: Orthopedic Surgery

## 2024-02-11 DIAGNOSIS — S92344D Nondisplaced fracture of fourth metatarsal bone, right foot, subsequent encounter for fracture with routine healing: Secondary | ICD-10-CM | POA: Insufficient documentation

## 2024-02-11 DIAGNOSIS — S92354D Nondisplaced fracture of fifth metatarsal bone, right foot, subsequent encounter for fracture with routine healing: Secondary | ICD-10-CM | POA: Insufficient documentation

## 2024-02-18 ENCOUNTER — Other Ambulatory Visit: Payer: Self-pay | Admitting: Internal Medicine

## 2024-02-18 DIAGNOSIS — I10 Essential (primary) hypertension: Secondary | ICD-10-CM

## 2024-03-03 ENCOUNTER — Other Ambulatory Visit: Payer: Self-pay | Admitting: Internal Medicine

## 2024-03-03 DIAGNOSIS — I10 Essential (primary) hypertension: Secondary | ICD-10-CM

## 2024-03-06 IMAGING — MG MM DIGITAL SCREENING BILAT W/ TOMO AND CAD
6 of 12 series · 6 of 36 positions shown · non-contrast
Comparison: Previous exam(s).

CLINICAL DATA: Screening.

EXAM:
DIGITAL SCREENING BILATERAL MAMMOGRAM WITH TOMOSYNTHESIS AND CAD
TECHNIQUE: Bilateral screening digital craniocaudal and mediolateral oblique
mammograms were obtained. Bilateral screening digital breast
tomosynthesis was performed. The images were evaluated with
computer-aided detection.

[R MLO synth-2D (1 of 2)]
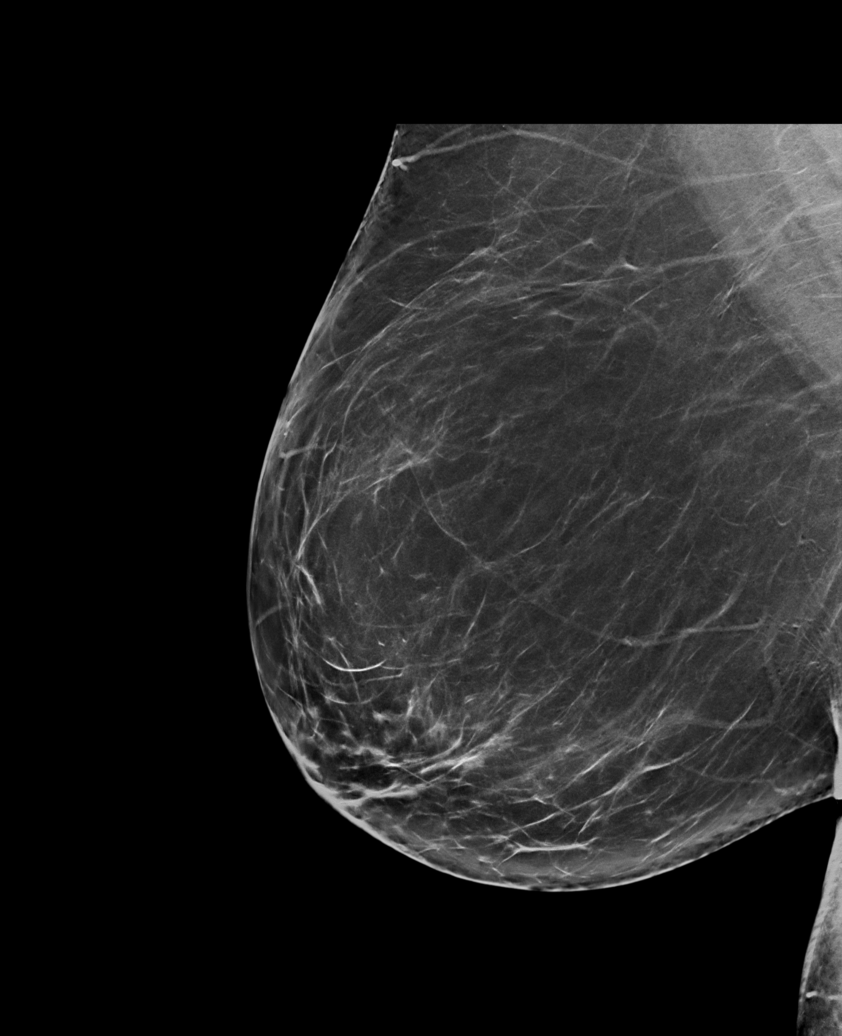

[R MLO synth-2D (2 of 2)]
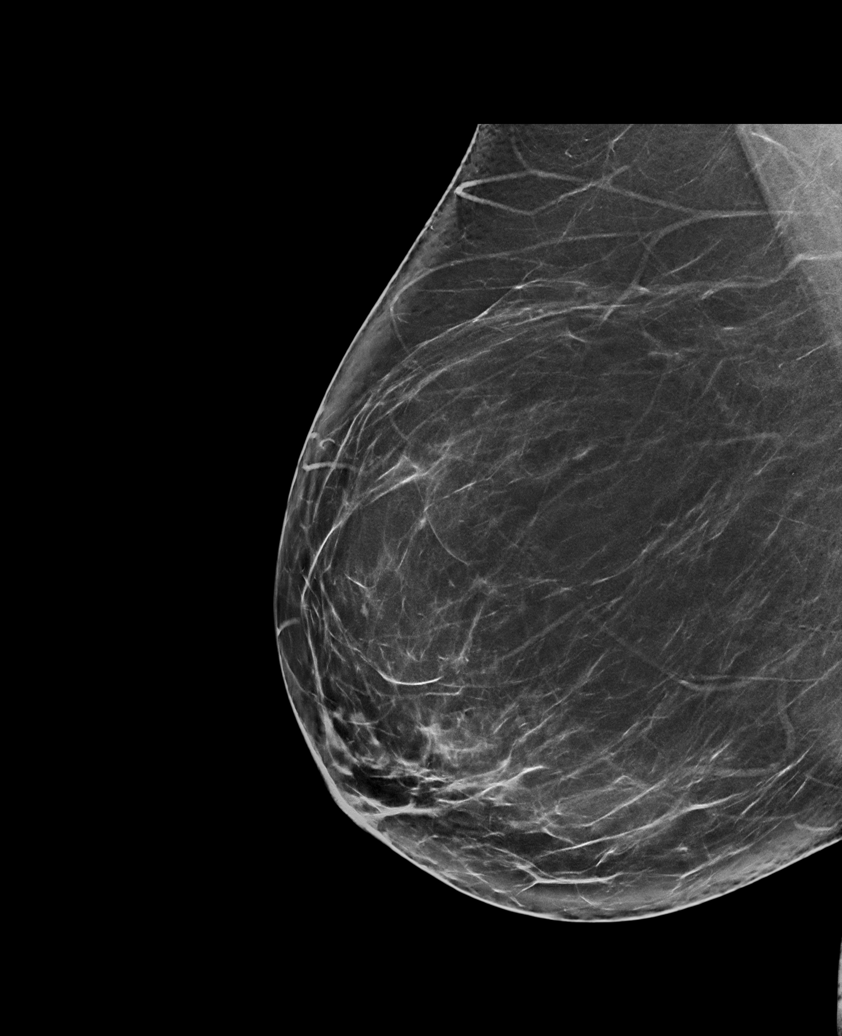

[R CC synth-2D]
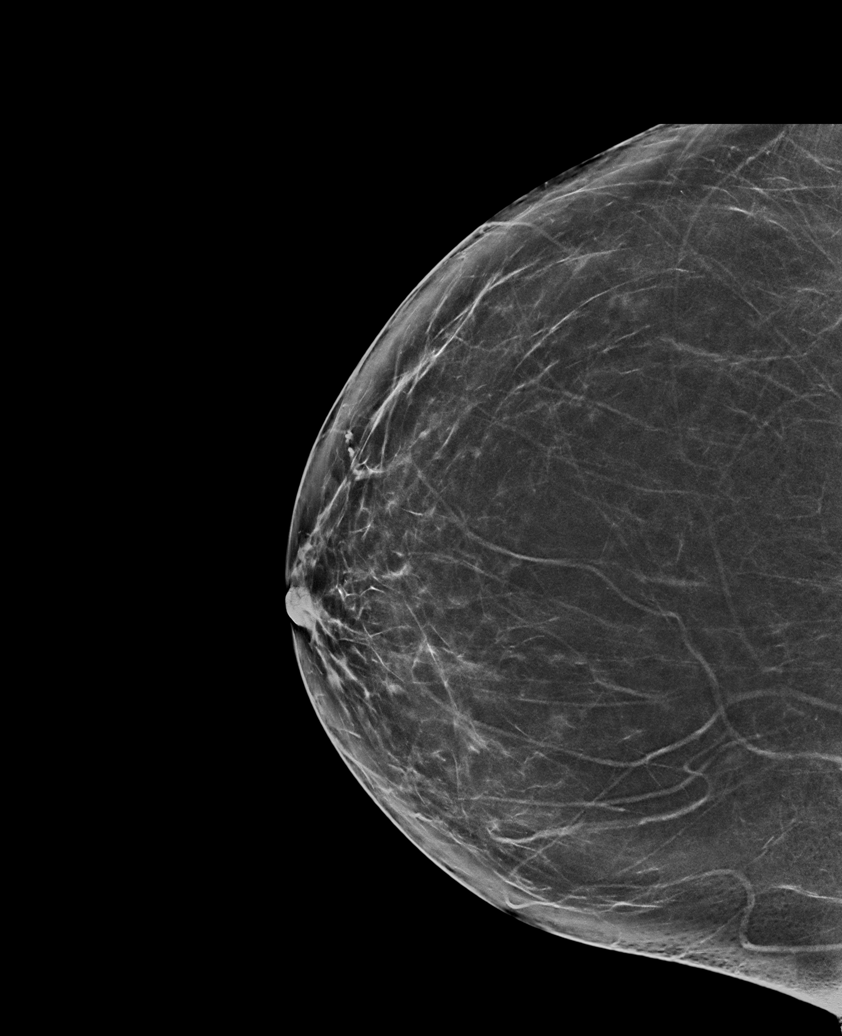

[L MLO synth-2D]
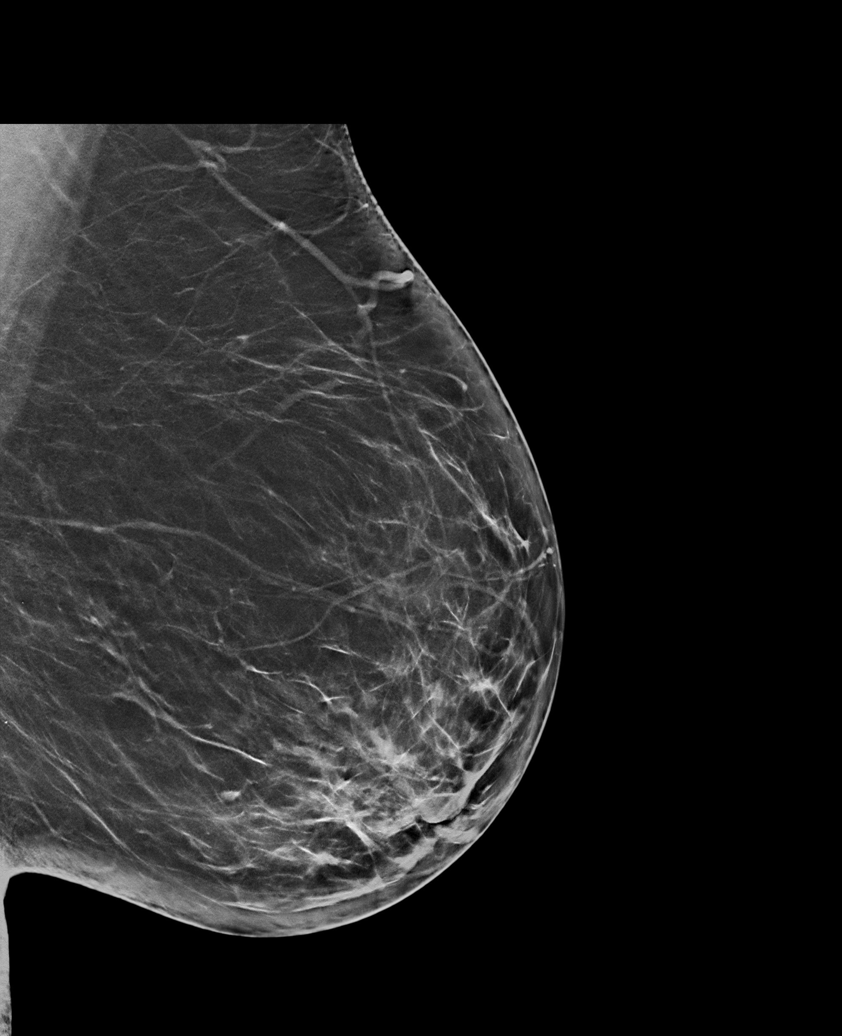

[L CC synth-2D (1 of 2)]
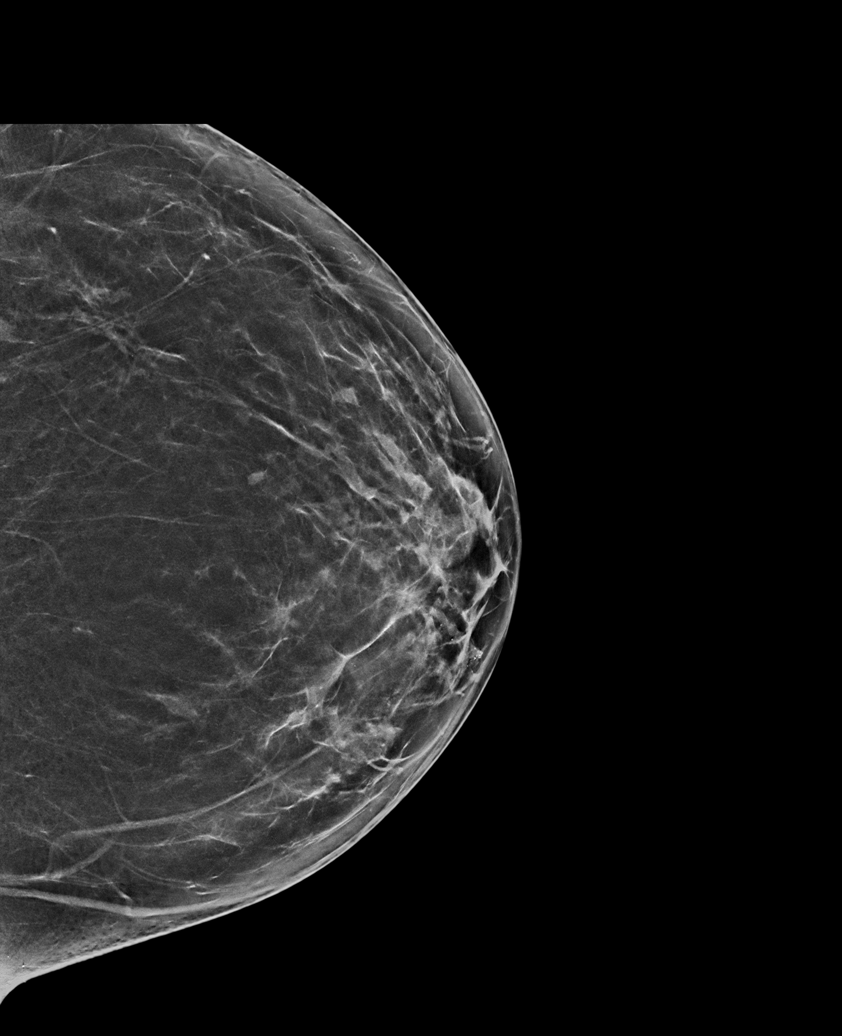

[L CC synth-2D (2 of 2)]
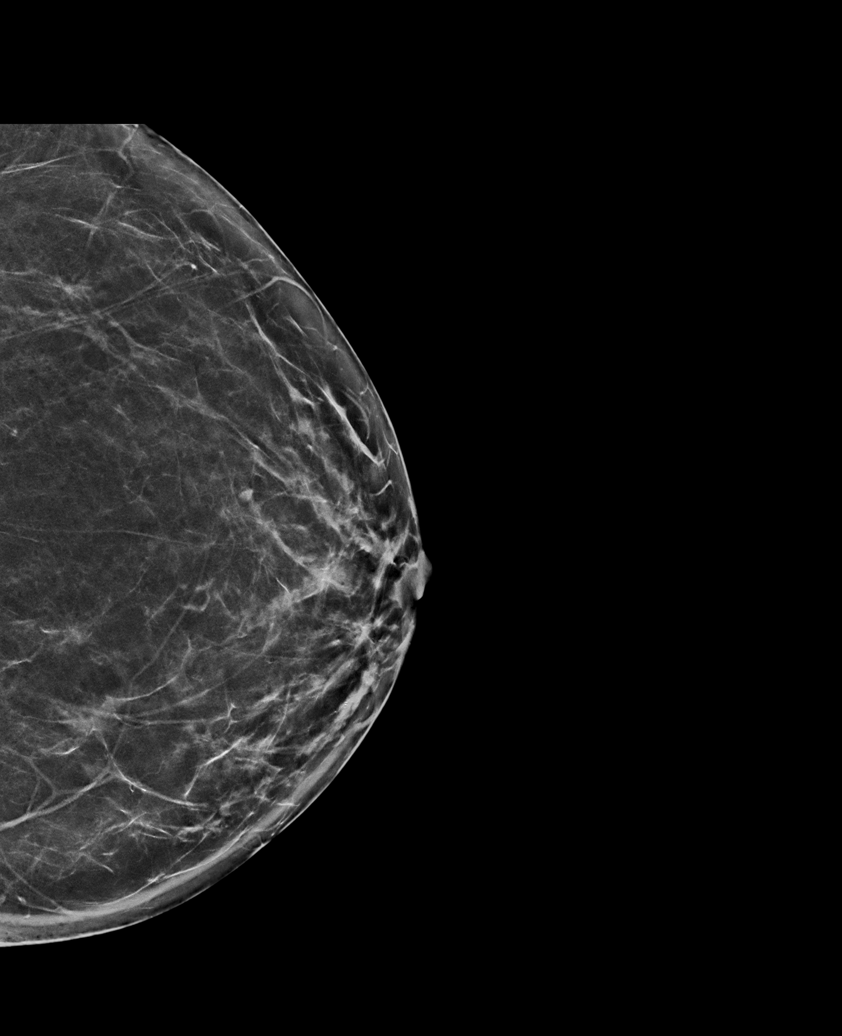

[6 of 36 positions shown; findings below may reference images not displayed]

ACR Breast Density Category b: There are scattered areas of
fibroglandular density.
FINDINGS: There are no findings suspicious for malignancy.
IMPRESSION: No mammographic evidence of malignancy. A result letter of this
screening mammogram will be mailed directly to the patient.

RECOMMENDATION:
Screening mammogram in one year. (Code:51-O-LD2)

BI-RADS CATEGORY  1: Negative.

## 2024-03-11 ENCOUNTER — Other Ambulatory Visit: Payer: Self-pay

## 2024-03-24 ENCOUNTER — Encounter: Payer: Self-pay | Admitting: Oncology

## 2024-04-02 ENCOUNTER — Other Ambulatory Visit: Payer: Self-pay | Admitting: *Deleted

## 2024-04-02 MED ORDER — TAMOXIFEN CITRATE 20 MG PO TABS: 20.0000 mg | ORAL_TABLET | Freq: Every day | ORAL | 3 refills | Status: AC

## 2024-06-03 ENCOUNTER — Ambulatory Visit (HOSPITAL_COMMUNITY)
Admission: RE | Admit: 2024-06-03 | Discharge: 2024-06-03 | Disposition: A | Payer: Self-pay | Source: Ambulatory Visit | Attending: Hematology | Admitting: Hematology

## 2024-06-03 ENCOUNTER — Inpatient Hospital Stay: Attending: Physician Assistant

## 2024-06-03 DIAGNOSIS — E559 Vitamin D deficiency, unspecified: Secondary | ICD-10-CM | POA: Diagnosis not present

## 2024-06-03 DIAGNOSIS — Z79810 Long term (current) use of selective estrogen receptor modulators (SERMs): Secondary | ICD-10-CM | POA: Diagnosis not present

## 2024-06-03 DIAGNOSIS — C50212 Malignant neoplasm of upper-inner quadrant of left female breast: Secondary | ICD-10-CM

## 2024-06-03 DIAGNOSIS — Z809 Family history of malignant neoplasm, unspecified: Secondary | ICD-10-CM | POA: Insufficient documentation

## 2024-06-03 DIAGNOSIS — Z800 Family history of malignant neoplasm of digestive organs: Secondary | ICD-10-CM | POA: Insufficient documentation

## 2024-06-03 DIAGNOSIS — G620 Drug-induced polyneuropathy: Secondary | ICD-10-CM | POA: Diagnosis not present

## 2024-06-03 DIAGNOSIS — Z9012 Acquired absence of left breast and nipple: Secondary | ICD-10-CM | POA: Diagnosis not present

## 2024-06-03 DIAGNOSIS — C50812 Malignant neoplasm of overlapping sites of left female breast: Secondary | ICD-10-CM | POA: Diagnosis present

## 2024-06-03 DIAGNOSIS — Z79899 Other long term (current) drug therapy: Secondary | ICD-10-CM

## 2024-06-03 DIAGNOSIS — M8580 Other specified disorders of bone density and structure, unspecified site: Secondary | ICD-10-CM | POA: Diagnosis not present

## 2024-06-03 DIAGNOSIS — Z170 Estrogen receptor positive status [ER+]: Secondary | ICD-10-CM | POA: Insufficient documentation

## 2024-06-03 LAB — COMPREHENSIVE METABOLIC PANEL WITH GFR
ALT: 45 U/L — ABNORMAL HIGH (ref 0–44)
AST: 32 U/L (ref 15–41)
Albumin: 4.3 g/dL (ref 3.5–5.0)
Alkaline Phosphatase: 80 U/L (ref 38–126)
Anion gap: 14 (ref 5–15)
BUN: 12 mg/dL (ref 6–20)
CO2: 22 mmol/L (ref 22–32)
Calcium: 8.9 mg/dL (ref 8.9–10.3)
Chloride: 104 mmol/L (ref 98–111)
Creatinine, Ser: 0.65 mg/dL (ref 0.44–1.00)
GFR, Estimated: >60 mL/min (ref >60)
Glucose, Bld: 83 mg/dL (ref 70–99)
Potassium: 4 mmol/L (ref 3.5–5.1)
Sodium: 140 mmol/L (ref 135–145)
Total Bilirubin: 0.5 mg/dL (ref 0.0–1.2)
Total Protein: 6.9 g/dL (ref 6.5–8.1)

## 2024-06-03 LAB — CBC WITH DIFFERENTIAL/PLATELET
Abs Immature Granulocytes: 0.05 K/uL (ref 0.00–0.07)
Basophils Absolute: 0.1 K/uL (ref 0.0–0.1)
Basophils Relative: 1 %
Eosinophils Absolute: 0.4 K/uL (ref 0.0–0.5)
Eosinophils Relative: 5 %
HCT: 43.5 % (ref 36.0–46.0)
Hemoglobin: 14 g/dL (ref 12.0–15.0)
Immature Granulocytes: 1 %
Lymphocytes Relative: 28 %
Lymphs Abs: 2 K/uL (ref 0.7–4.0)
MCH: 28.1 pg (ref 26.0–34.0)
MCHC: 32.2 g/dL (ref 30.0–36.0)
MCV: 87.2 fL (ref 80.0–100.0)
Monocytes Absolute: 0.5 K/uL (ref 0.1–1.0)
Monocytes Relative: 6 %
Neutro Abs: 4.4 K/uL (ref 1.7–7.7)
Neutrophils Relative %: 59 %
Platelets: 256 K/uL (ref 150–400)
RBC: 4.99 MIL/uL (ref 3.87–5.11)
RDW: 13.2 % (ref 11.5–15.5)
WBC: 7.3 K/uL (ref 4.0–10.5)
nRBC: 0 % (ref 0.0–0.2)

## 2024-06-03 NOTE — Patient Instructions (Signed)
 [facility] [place] - A DEPT OF [facility]  Discharge Instructions: Thank you for choosing [facility] to provide your oncology and hematology care.  If you have a lab appointment with the Cancer Center - please note that after April 8th, 2024, all labs will be drawn in the cancer center.  You do not have to check in or register with the main entrance as you have in the past but will complete your check-in in the cancer center.  Wear comfortable clothing and clothing appropriate for easy access to any Portacath or PICC line.   We strive to give you quality time with your provider. You may need to reschedule your appointment if you arrive late (15 or more minutes).  Arriving late affects you and other patients whose appointments are after yours.  Also, if you miss three or more appointments without notifying the office, you may be dismissed from the clinic at the provider's discretion.      For prescription refill requests, have your pharmacy contact our office and allow 72 hours for refills to be completed.    Today you received the following port flush with lab draw, return as scheduled.   To help prevent nausea and vomiting after your treatment, we encourage you to take your nausea medication as directed.  BELOW ARE SYMPTOMS THAT SHOULD BE REPORTED IMMEDIATELY: *FEVER GREATER THAN 100.4 F (38 C) OR HIGHER *CHILLS OR SWEATING *NAUSEA AND VOMITING THAT IS NOT CONTROLLED WITH YOUR NAUSEA MEDICATION *UNUSUAL SHORTNESS OF BREATH *UNUSUAL BRUISING OR BLEEDING *URINARY PROBLEMS (pain or burning when urinating, or frequent urination) *BOWEL PROBLEMS (unusual diarrhea, constipation, pain near the anus) TENDERNESS IN MOUTH AND THROAT WITH OR WITHOUT PRESENCE OF ULCERS (sore throat, sores in mouth, or a toothache) UNUSUAL RASH, SWELLING OR PAIN  UNUSUAL VAGINAL DISCHARGE OR ITCHING   Items with * indicate a potential emergency and should be followed up as  soon as possible or go to the Emergency Department if any problems should occur.  Please show the CHEMOTHERAPY ALERT CARD or IMMUNOTHERAPY ALERT CARD at check-in to the Emergency Department and triage nurse.  Should you have questions after your visit or need to cancel or reschedule your appointment, please contact [facility] [place] - A DEPT OF [facility] [phone]  and follow the prompts.  Office hours are 8:00 a.m. to 4:30 p.m. Monday - Friday. Please note that voicemails left after 4:00 p.m. may not be returned until the following business day.  We are closed weekends and major holidays. You have access to a nurse at all times for urgent questions. Please call the main number to the clinic [phone] and follow the prompts.  For any non-urgent questions, you may also contact your provider using MyChart. We now offer e-Visits for anyone 7 and older to request care online for non-urgent symptoms. For details visit [email or web address].   Also download the MyChart app! Go to the app store, search "MyChart", open the app, select [place], and log in with your MyChart username and password.

## 2024-06-03 NOTE — Progress Notes (Signed)
 Port flushed with good blood return noted. No bruising or swelling at site. Bandaid applied and patient discharged in satisfactory condition. VVS stable with no signs or symptoms of distressed noted.

## 2024-06-04 ENCOUNTER — Other Ambulatory Visit (HOSPITAL_COMMUNITY)

## 2024-06-04 ENCOUNTER — Inpatient Hospital Stay

## 2024-06-10 NOTE — Progress Notes (Unsigned)
 [facility] 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 Morna Pander on 08/28/2022 - Adjuvant AC x 4 cycles, 10/25/2022 through 12/12/2022 - Weekly paclitaxel , 12/27/2022 to 03/15/2023 - XRT to left chest wall, 04/18/2023 to 06/01/2023  CURRENT THERAPY: Tamoxifen  (since 04/05/2023)  BRIEF ONCOLOGIC HISTORY:   Oncology History  Breast cancer of upper-inner quadrant of left female breast (HCC)  10/01/2022 Initial Diagnosis   Breast cancer of upper-inner quadrant of left female breast   10/01/2022 Cancer Staging   Staging form: Breast, AJCC 8th Edition - Clinical stage from 10/01/2022: Stage IB (cT1c, cN1, cM0, G2, ER+, PR+, HER2-) - Signed by Rogers Hai, MD on 10/01/2022 Histopathologic type: Infiltrating duct carcinoma, NOS Stage prefix: Initial diagnosis Nuclear grade: G2 Histologic grading system: 3 grade system   10/25/2022 -  Chemotherapy   Patient is on Treatment Plan : BREAST ADJUVANT DOSE DENSE AC q14d / PACLitaxel  q7d      Genetic Testing   No pathogenic variants identified on the [facility] Common Hereditary Cancers+RNA panel. VUS in PDGFRA called c.502G>C identified. The report date is 01/16/2023.  The Common Hereditary Cancers Panel + RNA offered by [facility] includes sequencing and/or deletion duplication testing of the following 48 genes: APC*, ATM*, AXIN2, BAP1, BARD1, BMPR1A, BRCA1, BRCA2, BRIP1, CDH1, CDK4, CDKN2A (p14ARF), CDKN2A (p16INK4a), CHEK2, CTNNA1, DICER1*, EPCAM*, FH*, GREM1*, HOXB13, KIT, MBD4, MEN1*, MLH1*, MSH2*, MSH3*, MSH6*, MUTYH, NF1*, NTHL1, PALB2, PDGFRA, PMS2*, POLD1*, POLE, PTEN*, RAD51C, RAD51D, SDHA*, SDHB, SDHC*, SDHD, SMAD4, SMARCA4, STK11, TP53, TSC1*, TSC2, VHL.      CANCER STAGING: Cancer Staging  Breast cancer  of upper-inner quadrant of left female breast [facility]) Staging form: Breast, AJCC 8th Edition - Clinical stage from 10/01/2022: Stage IB (cT1c, cN1, cM0, G2, ER+, PR+, HER2-) - Signed by Rogers Hai, MD on 10/01/2022   INTERVAL HISTORY:   [name], a 48 y.o. female, returns for routine follow-up of her stage Ib left-sided breast cancer, ER/PR positive, s/p left mastectomy.  Diana Jensen was last seen on 12/04/2023 by Dr. Rogers.   In the interim since last visit, she is not had any t surgeries, hospitalizations, or changes in baseline health status.  *** At today's visit, she reports feeling ***.  She  reports ***% energy and ***% appetite.   ***She  is maintaining stable weight at this time.   ***No new onset breast lumps, chest wall abnormalities, or axillary lymphadenopathy. ***Denies any new aches or pains, headaches, or abdominal pain.  She continues to take tamoxifen , tolerating it reasonably well when she takes it at nighttime. ***She does sometimes get nauseous with tamoxifen , and uses Compazine . ***She denies any major hot flashes, night sweats, or abnormal vaginal bleeding/discharge.  ***She takes calcium and vitamin D  daily.***  ASSESSMENT & PLAN:  1.  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 - Left breast subareolar mass at 9:00 biopsy (08/10/2022): grade 2 IDC, ER 95% strong intensity, PR 90% strong intensity, Ki-67 10%, HER2 1+ - Left mastectomy and SLNB (08/28/2022): 1.6 x 1.2 x 0.7 cm IDC, margins negative, LVI present, 1/1 lymph node with metastatic carcinoma, metastasis 15 mm, focal extracapsular extension.  PT1 cpN1a. - PET  scan (10/19/2022): No evidence of hypermetabolic metastatic disease.  Diffuse hepatic steatosis. - 4 cycles of adjuvant AC from 10/25/2022 through 12/12/2022.  Weekly paclitaxel  started on 12/27/2022, completed on 03/15/2023 - Germline mutation testing: Negative - Tamoxifen   20 mg daily started on 04/05/2023 - XRT to the left chest wall to start on 04/16/2023, completed on 06/01/2023 - *** - She is tolerating tamoxifen  reasonably well when she takes it at nighttime.  She rarely gets nausea and uses Compazine .*** - She reported vaginal spotting and occasional discharge.***  She has not had Pap smear lately.*** ***She was recommended to see GYN for Pap smear as soon as possible as tamoxifen  can cause endometrial thickening and rarely dysplasia/malignancy.  She will also continue Pap smears once a year while on tamoxifen .*** - PHYSICAL EXAM (***): *** ***At the left mastectomy site, there is oval plaque measuring about 3 cm in transverse diameter, with thickening of the skin.  She reported seeing this about 4 weeks ago and associated with itching.  Differential diagnosis includes fungal skin infection.  Will try clotrimazole  ointment twice daily.  She will call us  back if the lesion does not go away in the next couple of weeks.*** - Most recent labs (06/03/2024): Normal CBC/D.  Mild elevation of ALT at 45 is stable, felt from previous workup to be attributable to fatty liver (transaminitis predates diagnosis of cancer, and PET scan from 10/19/2022 was negative for metastatic disease but did show diffuse hepatic steatosis). - Most recent screening mammogram, unilateral right breast (08/13/2023): BI-RADS Category 1, negative. - No red flag symptoms per history today.  *** - PLAN: *** Instructed to follow-up with GYN for Pap smear as soon as possible, as tamoxifen  can cause endometrial thickening and rarely dysplasia/malignancy.  She should continue Pap smears annually while on tamoxifen , along with regular GYN follow-up.  *** - In the meantime, continue tamoxifen  daily.  *** - Annual screening mammogram due 08/13/2024.  *** - Repeat labs with RTC in 6 months.  ***   2.  Chemotherapy-induced peripheral neuropathy: - Constant numbness in the fingertips and toes is stable.   -  PLAN: No medical intervention necessary at this time.  3.  Osteopenia, secondary to cancer therapy # Vitamin D  deficiency - History of right foot fracture after missing a step, falling, and landing on her right foot (June 2025). - She is at risk for bone density loss due to chemotherapy as well as tamoxifen  - Bone density/DEXA (06/03/2024): Z-score -2.0 of lumbar spine.  Bone density is below expected range for age. - She takes *** calcium and vitamin D  *** - Vitamin D  checked on 07/31/2023 was normal at 37.0. - Most recent calcium (06/03/2024) normal at 8.9 - PLAN: *** Recommend continuing weightbearing exercises, calcium, and vitamin D  supplementation to optimize bone health. - Consider repeat DEXA/bone density in 2 to 3 years.  ***  4.  (Questionable) antiphospholipid syndrome with pregnancy loss - History of positive lupus anticoagulant and antiphospholipid syndrome test after a spontaneous abortion, diagnosed after loss of triplets in 1998 at [facility] - Repeat testing by rheumatology (Dr. Jeannetta) on 09/22/2020 showed lupus anticoagulant not detected.  Beta-2 glycoprotein antibodies and anticardiolipin antibodies were also negative. - No history of arterial/venous thrombosis or strokes. - PLAN: No strong indication for blood thinners at this time.  5.  Social/family history: - Seen today with her husband and daughter.  She worked as a conservation officer, nature.  Current active smoker, 5 to 7 cigarettes/day, started at age 69. -  Mother had breast cancer at age 67.  Father had skin cancer.  Paternal grandfather had colon cancer.  Maternal grandfather had prostate cancer.  1 maternal cousin had pancreatic cancer.  2 paternal cousins have cancer.  PLAN SUMMARY:***Referral to GYN?  *** >> Port flush every 3 to 6 months *** >> Screening mammogram due 08/14/2023 >> Labs in 6 months = CBC/D, CMP, vitamin D  >> OFFICE visit in 6 months (1 week after labs)   REVIEW OF SYSTEMS: ***  Review of Systems -  Oncology  PHYSICAL EXAM:   Performance status (ECOG): {CHL ONC ED:[id number]} *** There were no vitals filed for this visit. Wt Readings from Last 3 Encounters:  11/25/23 250 lb (113.4 kg)  08/06/23 266 lb 12.1 oz (121 kg)  07/31/23 265 lb (120.2 kg)   Physical Exam   PAST MEDICAL/SURGICAL HISTORY:  Past Medical History:  Diagnosis Date   Allergy    seasonal   Antiphospholipid syndrome    Anxiety    Arthritis    deterioration of spine, knees   Carpal tunnel syndrome of right wrist    Clotting disorder    antiphospholipid syndrome   Depression    Encounter for general adult medical examination with abnormal findings 07/03/2022   Essential hypertension, benign 02/10/2020   Hyperlipidemia 08/02/2022   Sleep disorder 02/22/2016   Past Surgical History:  Procedure Laterality Date   BREAST BIOPSY Left 08/10/2022   US  LT BREAST BX W LOC DEV 1ST LESION IMG BX SPEC US  GUIDE 08/10/2022 [facility]   BREAST BIOPSY Left 08/18/2022   MM LT BREAST BX W LOC DEV EA AD LESION IMG BX SPEC STEREO GUIDE 08/18/2022 [facility]   BREAST BIOPSY Left 08/18/2022   MM LT BREAST BX W LOC DEV 1ST LESION IMAGE BX SPEC STEREO GUIDE 08/18/2022 [facility]   CARPAL TUNNEL RELEASE Right 01/09/2019   Procedure: RIGHT CARPAL TUNNEL RELEASE;  Surgeon: Margrette Taft BRAVO, MD;  Location: [facility];  Service: Orthopedics;  Laterality: Right;   DILATION AND CURETTAGE OF UTERUS     x2   EYE SURGERY     lazy eye   IRRIGATION AND DEBRIDEMENT HEMATOMA Left 10/05/2022   Procedure: HEMATOMA EVACUATION OF LEFT BREAST WITH JP DRAIN PLACEMENT;  Surgeon: Kallie Manuelita BROCKS, MD;  Location: [facility];  Service: General;  Laterality: Left;   PARTIAL HYSTERECTOMY  2011   PORTACATH PLACEMENT Right 10/05/2022   Procedure: INSERTION PORT-A-CATH;  Surgeon: Kallie Manuelita BROCKS, MD;  Location: [facility];  Service: General;  Laterality: Right;   SIMPLE MASTECTOMY WITH AXILLARY SENTINEL NODE BIOPSY Left 08/28/2022    Procedure: SIMPLE MASTECTOMY WITH AXILLARY SENTINEL NODE BIOPSY;  Surgeon: Kallie Manuelita BROCKS, MD;  Location: [facility];  Service: General;  Laterality: Left;    SOCIAL HISTORY:  Social History   Socioeconomic History   Marital status: Married    Spouse name: Not on file   Number of children: 2   Years of education: Not on file   Highest education level: Not on file  Occupational History   Not on file  Tobacco Use   Smoking status: Every Day    Current packs/day: 0.50    Average packs/day: 0.5 packs/day for 28.0 years (14.0 ttl pk-yrs)    Types: Cigarettes   Smokeless tobacco: Never   Tobacco comments:    She has been smoking since age 67, smokes half a pack daily,was doing one pack a day until last year   Vaping Use   Vaping status: Never  Used  Substance and Sexual Activity   Alcohol use: No   Drug use: No   Sexual activity: Yes    Birth control/protection: Surgical  Other Topics Concern   Not on file  Social History Narrative   Associates degree in engineer, site. 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 Has 2 children 1 son and 1 daughter. Married x 15 years.   Social Drivers of Health   Tobacco Use: High Risk (06/03/2024)   Patient History    Smoking Tobacco Use: Every Day    Smokeless Tobacco Use: Never    Passive Exposure: Not on file  Financial Resource Strain: High Risk (11/09/2022)   Overall Financial Resource Strain (CARDIA)    Difficulty of Paying Living Expenses: Hard  Food Insecurity: No Food Insecurity (08/30/2022)   Hunger Vital Sign    Worried About Running Out of Food in the Last Year: Never true    Ran Out of Food in the Last Year: Never true  Transportation Needs: No Transportation Needs (08/30/2022)   PRAPARE - Administrator, Civil Service (Medical): No    Lack of Transportation (Non-Medical): No  Physical Activity: Not on file  Stress: Not on file  Social Connections: Not on file  Intimate Partner Violence: Not At  Risk (08/30/2022)   Humiliation, Afraid, Rape, and Kick questionnaire    Fear of Current or Ex-Partner: No    Emotionally Abused: No    Physically Abused: No    Sexually Abused: No  Depression (PHQ2-9): Low Risk (07/31/2023)   Depression (PHQ2-9)    PHQ-2 Score: 0  Alcohol Screen: Not on file  Housing: Low Risk (08/30/2022)   Housing    Last Housing Risk Score: 0  Utilities: Not At Risk (08/30/2022)   AHC Utilities    Threatened with loss of utilities: No  Health Literacy: Not on file    FAMILY HISTORY:  Family History  Problem Relation Age of Onset   Breast cancer Mother 69   Arthritis Mother    Hypertension Mother    Osteoporosis Mother    Thyroid disease Father    Hypertension Father    Skin cancer Father    Mental illness Sister        depression ? bipolar   Dementia Maternal Grandmother    Prostate cancer Maternal Grandfather        prostate   Heart disease Paternal Grandmother    Colon cancer Paternal Grandfather    Mental illness Daughter    Pancreatic cancer Cousin        maternal cousin   Cancer Cousin        paternal, unknown type   Cancer Cousin        paternal, unknown type   Cancer - Lung Neg Hx    Cervical cancer Neg Hx     CURRENT MEDICATIONS:  Current Outpatient Medications  Medication Sig Dispense Refill   acetaminophen  (TYLENOL ) 500 MG tablet Take 1,000 mg by mouth every 6 (six) hours as needed for headache.     Cholecalciferol (VITAMIN D -3) 25 MCG (1000 UT) CAPS Take 1 capsule by mouth daily.     Clotrimazole  1 % OINT Apply 1 Application topically 2 (two) times daily. Apply topically to affected area twice a day 30 g 1   escitalopram  (LEXAPRO ) 10 MG tablet Take 1 tablet (10 mg total) by mouth daily. 90 tablet 3   hydrOXYzine  (VISTARIL ) 50 MG capsule Take 1 capsule (50 mg total) by  mouth daily as needed for anxiety.     ibuprofen  (ADVIL ) 600 MG tablet Take 1 tablet (600 mg total) by mouth every 6 (six) hours as needed. 30 tablet 0   losartan  (COZAAR )  25 MG tablet Take 1 tablet (25 mg total) by mouth daily. 90 tablet 1   prochlorperazine  (COMPAZINE ) 10 MG tablet Take 1 tablet (10 mg total) by mouth every 6 (six) hours as needed. 60 tablet 3   tamoxifen  (NOLVADEX ) 20 MG tablet Take 1 tablet (20 mg total) by mouth daily. 90 tablet 3   No current facility-administered medications for this visit.    ALLERGIES:  Allergies[1]  LABORATORY DATA:  I have reviewed the labs as listed.     Latest Ref Rng & Units 06/03/2024   10:33 AM 11/27/2023    1:31 PM 07/30/2023   10:58 AM  CBC  WBC 4.0 - 10.5 K/uL 7.3  7.1  5.9   Hemoglobin 12.0 - 15.0 g/dL 85.9  85.9  86.4   Hematocrit 36.0 - 46.0 % 43.5  41.6  41.0   Platelets 150 - 400 K/uL 256  208  239       Latest Ref Rng & Units 06/03/2024   10:33 AM 11/27/2023    1:31 PM 07/30/2023   10:58 AM  CMP  Glucose 70 - 99 mg/dL 83  866  96   BUN 6 - 20 mg/dL 12  11  13    Creatinine 0.44 - 1.00 mg/dL 9.34  9.30  9.29   Sodium 135 - 145 mmol/L 140  135  139   Potassium 3.5 - 5.1 mmol/L 4.0  3.4  4.0   Chloride 98 - 111 mmol/L 104  101  105   CO2 22 - 32 mmol/L 22  24  25    Calcium 8.9 - 10.3 mg/dL 8.9  8.5  9.1   Total Protein 6.5 - 8.1 g/dL 6.9  6.5  6.6   Total Bilirubin 0.0 - 1.2 mg/dL 0.5  0.7  0.4   Alkaline Phos 38 - 126 U/L 80  66  75   AST 15 - 41 U/L 32  65  33   ALT 0 - 44 U/L 45  79  61     DIAGNOSTIC IMAGING:  I have independently reviewed the scans and discussed with the patient. DG Bone Density Result Date: 06/03/2024 EXAM: DUAL X-RAY ABSORPTIOMETRY (DXA) FOR BONE MINERAL DENSITY 06/03/2024 11:12 am CLINICAL DATA:  48 year old Female Unknown menopause status. Anti-estrogen therapy History of fragility fracture. TECHNIQUE: An axial (e.g., hips, spine) and/or appendicular (e.g., radius) exam was performed, as appropriate, using [facility]. Images are obtained for bone mineral density measurement and are not obtained for diagnostic purposes.  [id number] Exclusions: L1, L4. COMPARISON:  None. FINDINGS: Scan quality: Good. LUMBAR SPINE (L2-L3): BMD (in g/cm2): 0.947 Z-score: -2.0 LEFT FEMORAL NECK: BMD (in g/cm2): 0.876 Z-score: -0.6 LEFT TOTAL HIP: BMD (in g/cm2): 0.935 Z-score: -0.3 RIGHT FEMORAL NECK: BMD (in g/cm2): 0.862 Z-score: -0.7 RIGHT TOTAL HIP: BMD (in g/cm2): 0.960 Z-score: -0.1 FRAX 10-YEAR PROBABILITY OF FRACTURE: FRAX not reported as the patient is not postmenopausal. IMPRESSION: Bone mineral density is below expected range for age. RECOMMENDATIONS: 1. All patients should optimize calcium and vitamin D  intake. 2. Patients with diagnosis of osteoporosis or at high risk for fracture should have regular bone mineral density tests. For patients eligible for Medicare, routine testing is allowed once every 2 years. The testing frequency  can be increased to one year for patients who have rapidly progressing disease, those who are receiving or discontinuing medical therapy to restore bone mass or have additional risk factors. Electronically Signed   By: Harrietta Sherry M.D.   On: 06/03/2024 13:19     WRAP UP:  All questions were answered. The patient knows to call the clinic with any problems, questions or concerns.  Medical decision making: ***  Time spent on visit: I spent {CHL ONC TIME VISIT - DTPQU:8845999869} counseling the patient face to face. The total time spent in the appointment was {CHL ONC TIME VISIT - DTPQU:[id number]} and more than 50% was on counseling.  Pleasant CHRISTELLA Barefoot, PA-C  ***    [1] No Known Allergies

## 2024-06-11 ENCOUNTER — Inpatient Hospital Stay: Admitting: Physician Assistant

## 2024-06-11 VITALS — BP 130/77 | HR 74 | Temp 100.0°F | Resp 18 | Ht 67.0 in | Wt 257.0 lb

## 2024-06-11 DIAGNOSIS — E559 Vitamin D deficiency, unspecified: Secondary | ICD-10-CM | POA: Diagnosis not present

## 2024-06-11 DIAGNOSIS — Z79810 Long term (current) use of selective estrogen receptor modulators (SERMs): Secondary | ICD-10-CM

## 2024-06-11 DIAGNOSIS — C50112 Malignant neoplasm of central portion of left female breast: Secondary | ICD-10-CM

## 2024-06-11 DIAGNOSIS — Z170 Estrogen receptor positive status [ER+]: Secondary | ICD-10-CM

## 2024-06-11 DIAGNOSIS — C50812 Malignant neoplasm of overlapping sites of left female breast: Secondary | ICD-10-CM | POA: Diagnosis not present

## 2024-06-11 DIAGNOSIS — Z1231 Encounter for screening mammogram for malignant neoplasm of breast: Secondary | ICD-10-CM

## 2024-06-11 DIAGNOSIS — Z95828 Presence of other vascular implants and grafts: Secondary | ICD-10-CM

## 2024-06-11 NOTE — Patient Instructions (Signed)
 [facility] at [facility] **VISIT SUMMARY & IMPORTANT INSTRUCTIONS **   You were seen today by Pleasant Barefoot PA-C for your follow-up visit.    HISTORY OF LEFT BREAST CANCER Your most recent labs, physical exam, and mammogram (February 2025) did not show any evidence of recurrent breast cancer. Continue to take tamoxifen  daily. Your next screening mammogram is due in February 2026. We we will see you for follow-up labs and physical exam in 6 months.   DECREASED BONE DENSITY You have some decreased bone density, related to your cancer treatment. Continue to take vitamin D  1000 units daily.  You can add calcium 500 mg daily. Work on increasing weightbearing exercises to improve your bone strength. Will recheck bone density scan in 2 to 3 years.  OTHER CONCERNS HOT FLASHES:  See if your hot flashes improved by switching the timing of your tamoxifen . Try taking over-the-counter black cohosh. If you have severe hot flashes and would like to start any medication to treat this, please let us  know. VAGINAL DISCHARGE: Please make an appointment with your gynecologist.  Even though you have already had a hysterectomy, tamoxifen  can still affect your vaginal cells and increased risk of vaginal abnormalities and malignancies. PORT REMOVAL: Referral has been entered to Dr. Kallie for port removal.  FOLLOW-UP APPOINTMENT: 6 months  ** Thank you for trusting me with your healthcare!  I strive to provide all of my patients with quality care at each visit.  If you receive a survey for this visit, I would be so grateful to you for taking the time to provide feedback.  Thank you in advance!  ~ [place]                                        Dr. Mickiel Davonna Pleasant Barefoot, PA-C          Delon Hope, NP   - - - - - - - - - - - - - - - - - -    Thank you for choosing [facility] at [facility] to provide your oncology and hematology care.   To afford each patient quality time with our provider, please arrive at least 15 minutes before your scheduled appointment time.   If you have a lab appointment with the Cancer Center please come in thru the Main Entrance and check in at the main information desk.  You need to re-schedule your appointment should you arrive 10 or more minutes late.  We strive to give you quality time with our providers, and arriving late affects you and other patients whose appointments are after yours.  Also, if you no show three or more times for appointments you may be dismissed from the clinic at the providers discretion.     Again, thank you for choosing [facility].  Our hope is that these requests will decrease the amount of time that you wait before being seen by our physicians.       _____________________________________________________________  Should you have questions after your visit to [facility], please contact our office at [phone] and follow the prompts.  Our office hours are 8:00 a.m. and 4:30 p.m. Monday - Friday.  Please note that voicemails left after 4:00 p.m. may not be returned until the following business day.  We are closed weekends and major holidays.  You do have access to a nurse 24-7, just call the main number to the clinic [phone] and do not press any options, hold on the line and a nurse will answer the phone.    For prescription refill requests, have your pharmacy contact our office and allow 72 hours.

## 2024-06-13 ENCOUNTER — Other Ambulatory Visit: Payer: Self-pay

## 2024-06-23 ENCOUNTER — Encounter: Payer: Self-pay | Admitting: *Deleted

## 2024-06-25 ENCOUNTER — Encounter (INDEPENDENT_AMBULATORY_CARE_PROVIDER_SITE_OTHER): Payer: Self-pay | Admitting: *Deleted

## 2024-06-25 ENCOUNTER — Ambulatory Visit: Payer: Self-pay

## 2024-06-25 VITALS — BP 130/80 | HR 78 | Resp 12 | Ht 67.0 in | Wt 261.1 lb

## 2024-06-25 DIAGNOSIS — F419 Anxiety disorder, unspecified: Secondary | ICD-10-CM

## 2024-06-25 DIAGNOSIS — I10 Essential (primary) hypertension: Secondary | ICD-10-CM

## 2024-06-25 DIAGNOSIS — Z1211 Encounter for screening for malignant neoplasm of colon: Secondary | ICD-10-CM | POA: Diagnosis not present

## 2024-06-25 DIAGNOSIS — Z23 Encounter for immunization: Secondary | ICD-10-CM

## 2024-06-25 MED ORDER — ESCITALOPRAM OXALATE 10 MG PO TABS: 10.0000 mg | ORAL_TABLET | Freq: Every day | ORAL | 3 refills | Status: AC

## 2024-06-25 MED ORDER — LOSARTAN POTASSIUM 25 MG PO TABS: 25.0000 mg | ORAL_TABLET | Freq: Every day | ORAL | 1 refills | Status: AC

## 2024-06-25 NOTE — Patient Instructions (Signed)
 [facility] Gastroenterology at  [address] Phone: [phone]

## 2024-06-25 NOTE — Progress Notes (Unsigned)
 "  Established Patient Office Visit  Subjective   Patient ID: Diana Jensen, female    DOB: [birth year]  Age: 48 y.o. MRN: [id number]  Chief Complaint  Patient presents with   Hypertension    Needs medication refilled. Former Dr. Melvenia patient    HPI Discussed the use of AI scribe software for clinical note transcription with the patient, who gave verbal consent to proceed.  History of Present Illness    Diana Jensen is a 48 year old female with hypertension who presents for a follow-up visit.  Hypertension - Has not taken losartan  for several months due to inability to refill prescription.  Musculoskeletal pain and injury - Sustained a fall in June resulting in fractures of two tarsals on the lateral aspect of the foot. 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 - Recent MRI performed; awaiting results. - Under care of Dr. Frederic Ivanoff at [facility]. - Taking meloxicam  for pain management. - Initially received chiropractic care for back issues; significant posterior pelvic tilt identified. - Experienced a pop in the lower back during chiropractic treatment; chiropractor recommended specialist evaluation before continuing therapy.  Lower extremity muscle cramps - Intermittent muscle cramps in the lower leg, sometimes radiating to the big toe. - Muscle cramps initially resolved with chiropractic care but have recurred. - Requires assistance from her daughter to relieve discomfort during episodes.  History of breast cancer - Recently established care with new oncologist, [place]. - Port removal scheduled with Dr. Kallie in February.  Depression - Currently taking medication for depression with a supply sufficient for two to three weeks. - Has a prescription for hydroxyzine , which she does not take.     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   Malignant neoplasm of central portion of left breast in female, estrogen receptor positive (HCC) 08/24/2022   Prediabetes 08/02/2022   Hyperlipidemia 08/02/2022   Subareolar mass of left breast 07/03/2022   Encounter for general adult medical examination with abnormal findings 07/03/2022   Impaired glucose tolerance 09/15/2021   Anxiety 09/15/2021   Radiculopathy of arm 09/15/2021   Chronic pain of right ankle 09/15/2021   Screening for diabetes mellitus (DM) 09/15/2021   Need for hepatitis C screening test 09/15/2021   Encounter for screening mammogram for malignant neoplasm of breast 09/15/2021   Current every day smoker 09/15/2021   Left lateral ankle pain 09/22/2020   Numbness and tingling 09/22/2020   Vitamin D  insufficiency 09/22/2020   Localized swelling, mass, or lump of lower extremity, bilateral 02/10/2020   Essential hypertension, benign 02/10/2020   S/P carpal tunnel release right 01/09/2019 01/14/2019   Carpal tunnel syndrome of right wrist    Panic disorder with agoraphobia 08/21/2016   Antiphospholipid syndrome 02/22/2016   Tobacco abuse 02/22/2016   Family history of breast cancer 02/22/2016   Lumbago 02/22/2016   Sleep disorder 02/22/2016   Depression, major, recurrent, in partial remission 02/22/2016  ROS    Objective:     BP 130/80 (BP Location: Right Arm, Patient Position: Sitting, Cuff Size: Large)   Pulse 78   Resp 12   Ht 5' 7 (1.702 m)   Wt 261 lb 1.3 oz (118.4 kg)    SpO2 95%   BMI 40.89 kg/m  BP Readings from Last 3 Encounters:  06/25/24 130/80  06/11/24 130/77  06/03/24 (!) 137/97   Wt Readings from Last 3 Encounters:  06/25/24 261 lb 1.3 oz (118.4 kg)  06/11/24 257 lb (116.6 kg)  11/25/23 250 lb (113.4 kg)     Physical Exam Vitals and nursing note reviewed.  Constitutional:      Appearance: Normal appearance. She is obese.  HENT:     Head: Normocephalic.     Right Ear: Tympanic membrane, ear canal and external ear normal.     Left Ear: Tympanic membrane, ear canal and external ear normal.     Nose: Nose normal.     Mouth/Throat:     Mouth: Mucous membranes are moist.     Pharynx: Oropharynx is clear.  Eyes:     Extraocular Movements: Extraocular movements intact.     Conjunctiva/sclera: Conjunctivae normal.     Pupils: Pupils are equal, round, and reactive to light.  Cardiovascular:     Rate and Rhythm: Normal rate and regular rhythm.  Pulmonary:     Effort: Pulmonary effort is normal.     Breath sounds: Normal breath sounds.  Abdominal:     General: Bowel sounds are normal.     Palpations: Abdomen is soft.  Musculoskeletal:        General: Normal range of motion.     Cervical back: Normal range of motion and neck supple.  Skin:    General: Skin is warm and dry.  Neurological:     Mental Status: She is alert and oriented to person, place, and time.  Psychiatric:        Mood and Affect: Mood normal.        Thought Content: Thought content normal.      No results found for any visits on 06/25/24.  Last CBC Lab Results  Component Value Date   WBC 7.3 06/03/2024   HGB 14.0 06/03/2024   HCT 43.5 06/03/2024   MCV 87.2 06/03/2024   MCH 28.1 06/03/2024   RDW 13.2 06/03/2024   PLT 256 06/03/2024   Last metabolic panel Lab Results  Component Value Date   GLUCOSE 83 06/03/2024   NA 140 06/03/2024   K 4.0 06/03/2024   CL 104 06/03/2024   CO2 22 06/03/2024   BUN 12 06/03/2024   CREATININE 0.65 06/03/2024    GFRNONAA >60 06/03/2024   CALCIUM 8.9 06/03/2024   PROT 6.9 06/03/2024   ALBUMIN 4.3 06/03/2024   LABGLOB 2.4 07/03/2022   AGRATIO 1.9 07/03/2022   BILITOT 0.5 06/03/2024   ALKPHOS 80 06/03/2024   AST 32 06/03/2024   ALT 45 (H) 06/03/2024   ANIONGAP 14 06/03/2024   Last lipids Lab Results  Component Value Date   CHOL 224 (H) 07/31/2023   HDL 37 (L) 07/31/2023   LDLCALC 144 (H) 07/31/2023   TRIG 238 (H) 07/31/2023   CHOLHDL 6.1 (H) 07/31/2023   Last hemoglobin A1c Lab Results  Component Value Date   HGBA1C 6.2 (H) 07/31/2023   Last thyroid functions Lab Results  Component Value Date   TSH 1.560 07/31/2023   FREET4 1.00 07/31/2023   Last vitamin D  Lab Results  Component Value Date   VD25OH 37.0 07/31/2023   Last vitamin B12 and Folate Lab Results  Component Value Date   VITAMINB12 352 07/31/2023   FOLATE 6.9 07/31/2023     The 10-year ASCVD risk score (Arnett DK, et al., 2019) is: 8.6%    Assessment & Plan:   Problem List Items Addressed This Visit       Cardiovascular and Mediastinum   Essential hypertension, benign - Primary   Remains adequately controlled with losartan  25 mg daily.  No medication changes are indicated today.      Relevant Medications   losartan  (COZAAR ) 25 MG tablet     Other   Anxiety   Adequately controlled with Lexapro  and as needed use of hydroxyzine .  No medication changes are indicated today.      Relevant Medications   escitalopram  (LEXAPRO ) 10 MG tablet   Other Visit Diagnoses       Encounter for screening colonoscopy       Relevant Orders   Ambulatory referral to Gastroenterology     Encounter for immunization       Relevant Orders   Flu vaccine trivalent PF, 6mos and older(Flulaval,Afluria,Fluarix,Fluzone) (Completed)      No follow-ups on file.    Leita Longs, FNP  "

## 2024-06-27 ENCOUNTER — Other Ambulatory Visit: Payer: Self-pay

## 2024-06-27 NOTE — Assessment & Plan Note (Signed)
 Remains adequately controlled with losartan 25 mg daily.  No medication changes are indicated today.

## 2024-06-27 NOTE — Assessment & Plan Note (Signed)
 Adequately controlled with Lexapro and as needed use of hydroxyzine.  No medication changes are indicated today.

## 2024-07-10 ENCOUNTER — Other Ambulatory Visit: Payer: Self-pay

## 2024-07-10 MED ORDER — MELOXICAM 7.5 MG PO TABS: 7.5000 mg | ORAL_TABLET | Freq: Every day | ORAL | 3 refills | Status: AC

## 2024-07-10 MED ORDER — GABAPENTIN 300 MG PO CAPS: 300.0000 mg | ORAL_CAPSULE | Freq: Three times a day (TID) | ORAL | 3 refills | Status: AC

## 2024-07-10 NOTE — Telephone Encounter (Signed)
 Copied from CRM [id number]. Topic: Clinical - Medication Refill >> Jul 10, 2024  8:26 AM Myrick T wrote: Medication: meloxicam  (MOBIC ) 7.5 MG tablet and gabapentin  (NEURONTIN ) 300 MG capsule  Has the patient contacted their pharmacy? No  This is the patient's preferred pharmacy:  [facility] - [place], [place] - [address] [place] [address] Phone: [phone] Fax: [phone]  Is this the correct pharmacy for this prescription? Yes  Has the prescription been filled recently? Yes  Is the patient out of the medication? No  Has the patient been seen for an appointment in the last year OR does the patient have an upcoming appointment? Yes  Can we respond through MyChart? Yes  Agent: Please be advised that Rx refills may take up to 3 business days. We ask that you follow-up with your pharmacy.

## 2024-07-24 ENCOUNTER — Telehealth: Payer: Self-pay | Admitting: *Deleted

## 2024-07-24 NOTE — Telephone Encounter (Signed)
" °  Procedure: COLONOSCOPY  Height: 5'7 Weight: 250lbs        Have you had a colonoscopy before?  no  Do you have family history of colon cancer?  yes  Do you have a family history of polyps? yes  Previous colonoscopy with polyps removed? no  Do you have a history colorectal cancer?   no  Are you diabetic?  no  Do you have a prosthetic or mechanical heart valve? no  Do you have a pacemaker/defibrillator?   no  Have you had endocarditis/atrial fibrillation?  no  Do you use supplemental oxygen/CPAP?  no  Have you had joint replacement within the last 12 months?  no  Do you tend to be constipated or have to use laxatives?  no   Do you have history of alcohol use? If yes, how much and how often.  no  Do you have history or are you using drugs? If yes, what do are you  using?  no  Have you ever had a stroke/heart attack?  no  Have you ever had a heart or other vascular stent placed,?no  Do you take weight loss medication? no  female patients,: have you had a hysterectomy? yes                              are you post menopausal?  no                              do you still have your menstrual cycle?     Date of last menstrual period? 2011  Do you take any blood-thinning medications such as: (Plavix, aspirin, Coumadin, Aggrenox, Brilinta, Xarelto, Eliquis, Pradaxa, Savaysa or Effient)? no  If yes we need the name, milligram, dosage and who is prescribing doctor:               Current Outpatient Medications  Medication Sig Dispense Refill   acetaminophen  (TYLENOL ) 500 MG tablet Take 1,000 mg by mouth every 6 (six) hours as needed for headache.     Cholecalciferol (VITAMIN D -3) 25 MCG (1000 UT) CAPS Take 1 capsule by mouth daily.     escitalopram  (LEXAPRO ) 10 MG tablet Take 1 tablet (10 mg total) by mouth daily. 90 tablet 3   gabapentin  (NEURONTIN ) 300 MG capsule Take 1 capsule (300 mg total) by mouth 3 (three) times daily. (Patient taking differently: Take 300 mg by mouth  2 (two) times daily.) 90 capsule 3   losartan  (COZAAR ) 25 MG tablet Take 1 tablet (25 mg total) by mouth daily. 90 tablet 1   meloxicam  (MOBIC ) 7.5 MG tablet Take 1 tablet (7.5 mg total) by mouth daily. 90 tablet 3   tamoxifen  (NOLVADEX ) 20 MG tablet Take 1 tablet (20 mg total) by mouth daily. 90 tablet 3   No current facility-administered medications for this visit.    Allergies[1]      [1] No Known Allergies  "

## 2024-07-29 ENCOUNTER — Ambulatory Visit: Admitting: General Surgery

## 2024-07-31 ENCOUNTER — Ambulatory Visit: Admitting: General Surgery

## 2024-07-31 ENCOUNTER — Encounter: Payer: Self-pay | Admitting: General Surgery

## 2024-07-31 VITALS — BP 131/83 | HR 73 | Temp 98.3°F | Resp 16 | Ht 67.0 in | Wt 261.0 lb

## 2024-07-31 DIAGNOSIS — Z95828 Presence of other vascular implants and grafts: Secondary | ICD-10-CM | POA: Insufficient documentation

## 2024-07-31 NOTE — Progress Notes (Unsigned)
 [facility] History and Physical  Reason for Referral:*** Referring Physician: ***  Chief Complaint   Follow-up     Diana Jensen is a 49 y.o. female.  HPI:   Discussed the use of AI scribe software for clinical note transcription with the patient, who gave verbal consent to proceed.  History of Present Illness      ***.  The *** started *** and has had a duration of ***.  It is associated with ***.  The *** is improved with ***, and is made worse with ***.    Quality*** Context***  Past Medical History:  Diagnosis Date   Allergy    seasonal   Antiphospholipid syndrome    Anxiety    Arthritis    deterioration of spine, knees   Carpal tunnel syndrome of right wrist    Clotting disorder    antiphospholipid syndrome   Depression    Encounter for general adult medical examination with abnormal findings 07/03/2022   Essential hypertension, benign 02/10/2020   Hyperlipidemia 08/02/2022   Sleep disorder 02/22/2016    Past Surgical History:  Procedure Laterality Date   BREAST BIOPSY Left 08/10/2022   US  LT BREAST BX W LOC DEV 1ST LESION IMG BX SPEC US  GUIDE 08/10/2022 [facility]   BREAST BIOPSY Left 08/18/2022   MM LT BREAST BX W LOC DEV EA AD LESION IMG BX SPEC STEREO GUIDE 08/18/2022 [facility]   BREAST BIOPSY Left 08/18/2022   MM LT BREAST BX W LOC DEV 1ST LESION IMAGE BX SPEC STEREO GUIDE 08/18/2022 [facility]   CARPAL TUNNEL RELEASE Right 01/09/2019   Procedure: RIGHT CARPAL TUNNEL RELEASE;  Surgeon: Margrette Taft BRAVO, MD;  Location: [facility];  Service: Orthopedics;  Laterality: Right;   DILATION AND CURETTAGE OF UTERUS     x2   EYE SURGERY     lazy eye   IRRIGATION AND DEBRIDEMENT HEMATOMA Left 10/05/2022   Procedure: HEMATOMA EVACUATION OF LEFT BREAST WITH JP DRAIN PLACEMENT;  Surgeon: Kallie Manuelita JAYSON, MD;  Location: [facility];  Service: General;  Laterality: Left;   PARTIAL HYSTERECTOMY  2011   PORTACATH  PLACEMENT Right 10/05/2022   Procedure: INSERTION PORT-A-CATH;  Surgeon: Kallie Manuelita JAYSON, MD;  Location: [facility];  Service: General;  Laterality: Right;   SIMPLE MASTECTOMY WITH AXILLARY SENTINEL NODE BIOPSY Left 08/28/2022   Procedure: SIMPLE MASTECTOMY WITH AXILLARY SENTINEL NODE BIOPSY;  Surgeon: Kallie Manuelita JAYSON, MD;  Location: [facility];  Service: General;  Laterality: Left;    Family History  Problem Relation Age of Onset   Breast cancer Mother 72   Arthritis Mother    Hypertension Mother    Osteoporosis Mother    Thyroid disease Father    Hypertension Father    Skin cancer Father    Mental illness Sister        depression ? bipolar   Dementia Maternal Grandmother    Prostate cancer Maternal Grandfather        prostate   Heart disease Paternal Grandmother    Colon cancer Paternal Grandfather    Mental illness Daughter    Pancreatic cancer Cousin        maternal cousin   Cancer Cousin        paternal, unknown type   Cancer Cousin        paternal, unknown type   Cancer - Lung Neg Hx    Cervical cancer Neg Hx     Social History[1]  Medications: {medication reviewed/display:3041432} Allergies as  of 07/31/2024   No Known Allergies      Medication List        Accurate as of July 31, 2024  2:54 PM. If you have any questions, ask your nurse or doctor.          acetaminophen  500 MG tablet Commonly known as: TYLENOL  Take 1,000 mg by mouth every 6 (six) hours as needed for headache.   escitalopram  10 MG tablet Commonly known as: LEXAPRO  Take 1 tablet (10 mg total) by mouth daily.   gabapentin  300 MG capsule Commonly known as: NEURONTIN  Take 1 capsule (300 mg total) by mouth 3 (three) times daily. What changed: when to take this   losartan  25 MG tablet Commonly known as: COZAAR  Take 1 tablet (25 mg total) by mouth daily.   meloxicam  7.5 MG tablet Commonly known as: MOBIC  Take 1 tablet (7.5 mg total) by mouth daily.   tamoxifen  20  MG tablet Commonly known as: NOLVADEX  Take 1 tablet (20 mg total) by mouth daily.   Vitamin D -3 25 MCG (1000 UT) Caps Take 1 capsule by mouth daily.         ROS:  {Review of Systems:30496}  Blood pressure 131/83, pulse 73, temperature 98.3 F (36.8 C), temperature source Oral, resp. rate 16, height 5' 7 (1.702 m), weight 261 lb (118.4 kg), SpO2 96%. Physical Exam Physical Exam   Results: No results found for this or any previous visit (from the past 48 hours).  No results found.   Assessment and Plan: Assessment and Plan Assessment & Plan      [name] is a 49 y.o. female with *** -*** -*** -Follow up ***  All questions were answered to the satisfaction of the patient and family***.  The risk and benefits of *** were discussed including but not limited to ***.  After careful consideration, Diana Jensen has decided to ***.    Manuelita JAYSON Pander 07/31/2024, 2:54 PM            [1] Social History Tobacco Use   Smoking status: Every Day    Current packs/day: 0.50    Average packs/day: 0.5 packs/day for 28.0 years (14.0 ttl pk-yrs)    Types: Cigarettes   Smokeless tobacco: Never   Tobacco comments:    She has been smoking since age 53, smokes half a pack daily,was doing one pack a day until last year   Vaping Use   Vaping status: Never Used  Substance Use Topics   Alcohol use: No   Drug use: No

## 2024-08-01 NOTE — H&P (Signed)
 [facility] History and Physical  Reason for Referral: Port in place Referring Physician: Oncology   Chief Complaint   Follow-up     Diana Jensen is a 49 y.o. female.  HPI:   [name] is a super sweet lady s/p mastectomy on the left who has a right sided port. She has finished her therapy and is ready for port removal. She is otherwise doing well. She had no issues with the port.  She has not been able to get any reconstruction as she required radiation on the left side after the mastectomy.   Past Medical History:  Diagnosis Date   Allergy    seasonal   Antiphospholipid syndrome    Anxiety    Arthritis    deterioration of spine, knees   Carpal tunnel syndrome of right wrist    Clotting disorder    antiphospholipid syndrome   Depression    Encounter for general adult medical examination with abnormal findings 07/03/2022   Essential hypertension, benign 02/10/2020   Hyperlipidemia 08/02/2022   Sleep disorder 02/22/2016    Past Surgical History:  Procedure Laterality Date   BREAST BIOPSY Left 08/10/2022   US  LT BREAST BX W LOC DEV 1ST LESION IMG BX SPEC US  GUIDE 08/10/2022 [facility]   BREAST BIOPSY Left 08/18/2022   MM LT BREAST BX W LOC DEV EA AD LESION IMG BX SPEC STEREO GUIDE 08/18/2022 [facility]   BREAST BIOPSY Left 08/18/2022   MM LT BREAST BX W LOC DEV 1ST LESION IMAGE BX SPEC STEREO GUIDE 08/18/2022 [facility]   CARPAL TUNNEL RELEASE Right 01/09/2019   Procedure: RIGHT CARPAL TUNNEL RELEASE;  Surgeon: Margrette Taft BRAVO, MD;  Location: [facility];  Service: Orthopedics;  Laterality: Right;   DILATION AND CURETTAGE OF UTERUS     x2   EYE SURGERY     lazy eye   IRRIGATION AND DEBRIDEMENT HEMATOMA Left 10/05/2022   Procedure: HEMATOMA EVACUATION OF LEFT BREAST WITH JP DRAIN PLACEMENT;  Surgeon: Kallie Manuelita JAYSON, MD;  Location: [facility];  Service: General;  Laterality: Left;   PARTIAL HYSTERECTOMY  2011   PORTACATH PLACEMENT Right  10/05/2022   Procedure: INSERTION PORT-A-CATH;  Surgeon: Kallie Manuelita JAYSON, MD;  Location: [facility];  Service: General;  Laterality: Right;   SIMPLE MASTECTOMY WITH AXILLARY SENTINEL NODE BIOPSY Left 08/28/2022   Procedure: SIMPLE MASTECTOMY WITH AXILLARY SENTINEL NODE BIOPSY;  Surgeon: Kallie Manuelita JAYSON, MD;  Location: [facility];  Service: General;  Laterality: Left;    Family History  Problem Relation Age of Onset   Breast cancer Mother 30   Arthritis Mother    Hypertension Mother    Osteoporosis Mother    Thyroid disease Father    Hypertension Father    Skin cancer Father    Mental illness Sister        depression ? bipolar   Dementia Maternal Grandmother    Prostate cancer Maternal Grandfather        prostate   Heart disease Paternal Grandmother    Colon cancer Paternal Grandfather    Mental illness Daughter    Pancreatic cancer Cousin        maternal cousin   Cancer Cousin        paternal, unknown type   Cancer Cousin        paternal, unknown type   Cancer - Lung Neg Hx    Cervical cancer Neg Hx     [Social History]  [Social History] Tobacco Use  Smoking status: Every Day    Current packs/day: 0.50    Average packs/day: 0.5 packs/day for 28.0 years (14.0 ttl pk-yrs)    Types: Cigarettes   Smokeless tobacco: Never   Tobacco comments:    She has been smoking since age 24, smokes half a pack daily,was doing one pack a day until last year   Vaping Use   Vaping status: Never Used  Substance Use Topics   Alcohol use: No   Drug use: No    Medications: I have reviewed the patient's current medications. Allergies as of 07/31/2024   No Known Allergies      Medication List        Accurate as of July 31, 2024  2:54 PM. If you have any questions, ask your nurse or doctor.          acetaminophen  500 MG tablet Commonly known as: TYLENOL  Take 1,000 mg by mouth every 6 (six) hours as needed for headache.   escitalopram  10 MG tablet Commonly known as:  LEXAPRO  Take 1 tablet (10 mg total) by mouth daily.   gabapentin  300 MG capsule Commonly known as: NEURONTIN  Take 1 capsule (300 mg total) by mouth 3 (three) times daily. What changed: when to take this   losartan  25 MG tablet Commonly known as: COZAAR  Take 1 tablet (25 mg total) by mouth daily.   meloxicam  7.5 MG tablet Commonly known as: MOBIC  Take 1 tablet (7.5 mg total) by mouth daily.   tamoxifen  20 MG tablet Commonly known as: NOLVADEX  Take 1 tablet (20 mg total) by mouth daily.   Vitamin D -3 25 MCG (1000 UT) Caps Take 1 capsule by mouth daily.         ROS:  A comprehensive review of systems was negative except for: Integument/breast: positive for left mastectomy, right port in place  Blood pressure 131/83, pulse 73, temperature 98.3 F (36.8 C), temperature source Oral, resp. rate 16, height 5' 7 (1.702 m), weight 261 lb (118.4 kg), SpO2 96%. Physical Exam Vitals reviewed.  HENT:     Head: Normocephalic.     Nose: Nose normal.     Mouth/Throat:     Mouth: Mucous membranes are moist.  Cardiovascular:     Rate and Rhythm: Normal rate.  Pulmonary:     Effort: Pulmonary effort is normal.  Chest:     Comments: Port in right chest wall, left sided mastectomy healed  Abdominal:     General: There is no distension.     Palpations: Abdomen is soft.     Tenderness: There is no abdominal tenderness.  Musculoskeletal:        General: Normal range of motion.  Skin:    General: Skin is warm.  Neurological:     General: No focal deficit present.     Mental Status: She is alert.  Psychiatric:        Mood and Affect: Mood normal.     Results: None   Assessment and Plan:  Diana Jensen is a 49 y.o. female with a right chest wall port after left breast cancer treatment. Doing well. Will plan to remove the port with local. Discussed risk of bleeding, infection, incomplete removal, CXR after removal.    All questions were answered to the satisfaction of the  patient and family.    Manuelita JAYSON Pander 07/31/2024, 2:54 PM

## 2024-08-06 ENCOUNTER — Ambulatory Visit (HOSPITAL_COMMUNITY): Admit: 2024-08-06 | Admitting: General Surgery

## 2024-08-06 DIAGNOSIS — Z95828 Presence of other vascular implants and grafts: Secondary | ICD-10-CM

## 2024-08-11 ENCOUNTER — Encounter: Admitting: Obstetrics & Gynecology

## 2024-08-13 ENCOUNTER — Ambulatory Visit (HOSPITAL_COMMUNITY)

## 2024-12-10 ENCOUNTER — Inpatient Hospital Stay

## 2024-12-17 ENCOUNTER — Inpatient Hospital Stay: Admitting: Physician Assistant

## 2024-12-24 ENCOUNTER — Ambulatory Visit: Payer: Self-pay
